# Patient Record
Sex: Male | Born: 1950
Health system: Southern US, Community
[De-identification: ages and names within clinical notes are randomized; demographics above are authoritative.]

## PROBLEM LIST (undated history)

## (undated) DIAGNOSIS — N201 Calculus of ureter: Secondary | ICD-10-CM

## (undated) DIAGNOSIS — K449 Diaphragmatic hernia without obstruction or gangrene: Secondary | ICD-10-CM

## (undated) DIAGNOSIS — N401 Enlarged prostate with lower urinary tract symptoms: Secondary | ICD-10-CM

## (undated) DIAGNOSIS — Z5189 Encounter for other specified aftercare: Secondary | ICD-10-CM

## (undated) DIAGNOSIS — M1990 Unspecified osteoarthritis, unspecified site: Secondary | ICD-10-CM

## (undated) DIAGNOSIS — K317 Polyp of stomach and duodenum: Secondary | ICD-10-CM

## (undated) DIAGNOSIS — N486 Induration penis plastica: Secondary | ICD-10-CM

## (undated) DIAGNOSIS — K219 Gastro-esophageal reflux disease without esophagitis: Secondary | ICD-10-CM

## (undated) DIAGNOSIS — K222 Esophageal obstruction: Secondary | ICD-10-CM

## (undated) DIAGNOSIS — K56699 Other intestinal obstruction unspecified as to partial versus complete obstruction: Secondary | ICD-10-CM

## (undated) DIAGNOSIS — G20A1 Parkinson's disease without dyskinesia, without mention of fluctuations: Secondary | ICD-10-CM

## (undated) DIAGNOSIS — N529 Male erectile dysfunction, unspecified: Secondary | ICD-10-CM

## (undated) DIAGNOSIS — Z973 Presence of spectacles and contact lenses: Secondary | ICD-10-CM

## (undated) DIAGNOSIS — Z8679 Personal history of other diseases of the circulatory system: Secondary | ICD-10-CM

## (undated) DIAGNOSIS — G20 Parkinson's disease: Secondary | ICD-10-CM

## (undated) DIAGNOSIS — N200 Calculus of kidney: Secondary | ICD-10-CM

## (undated) DIAGNOSIS — K4090 Unilateral inguinal hernia, without obstruction or gangrene, not specified as recurrent: Secondary | ICD-10-CM

## (undated) DIAGNOSIS — G509 Disorder of trigeminal nerve, unspecified: Secondary | ICD-10-CM

## (undated) DIAGNOSIS — I10 Essential (primary) hypertension: Secondary | ICD-10-CM

## (undated) DIAGNOSIS — Z9889 Other specified postprocedural states: Secondary | ICD-10-CM

## (undated) DIAGNOSIS — Z87442 Personal history of urinary calculi: Secondary | ICD-10-CM

## (undated) DIAGNOSIS — E871 Hypo-osmolality and hyponatremia: Secondary | ICD-10-CM

## (undated) DIAGNOSIS — D509 Iron deficiency anemia, unspecified: Secondary | ICD-10-CM

## (undated) DIAGNOSIS — Z8719 Personal history of other diseases of the digestive system: Secondary | ICD-10-CM

## (undated) HISTORY — DX: Iron deficiency anemia, unspecified: D50.9

## (undated) HISTORY — DX: Unspecified osteoarthritis, unspecified site: M19.90

## (undated) HISTORY — DX: Polyp of stomach and duodenum: K31.7

## (undated) HISTORY — PX: VARICOCELECTOMY: SHX1084

## (undated) HISTORY — DX: Encounter for other specified aftercare: Z51.89

## (undated) HISTORY — PX: LASIK: SHX215

## (undated) HISTORY — DX: Parkinson's disease without dyskinesia, without mention of fluctuations: G20.A1

## (undated) HISTORY — DX: Diaphragmatic hernia without obstruction or gangrene: K44.9

## (undated) HISTORY — DX: Other intestinal obstruction unspecified as to partial versus complete obstruction: K56.699

## (undated) HISTORY — DX: Essential (primary) hypertension: I10

## (undated) HISTORY — DX: Male erectile dysfunction, unspecified: N52.9

## (undated) HISTORY — DX: Calculus of kidney: N20.0

## (undated) HISTORY — DX: Gastro-esophageal reflux disease without esophagitis: K21.9

## (undated) HISTORY — DX: Esophageal obstruction: K22.2

## (undated) HISTORY — PX: INGUINAL HERNIA REPAIR: SUR1180

## (undated) HISTORY — PX: NASAL SINUS SURGERY: SHX719

## (undated) HISTORY — DX: Unilateral inguinal hernia, without obstruction or gangrene, not specified as recurrent: K40.90

## (undated) HISTORY — DX: Parkinson's disease: G20

## (undated) HISTORY — PX: KIDNEY STONE SURGERY: SHX686

## (undated) HISTORY — PX: ETHMOIDECTOMY: SHX5197

---

## 1990-09-13 HISTORY — PX: COLECTOMY: SHX59

## 1990-09-13 HISTORY — PX: TOTAL COLECTOMY: SHX852

## 1998-11-20 ENCOUNTER — Ambulatory Visit (HOSPITAL_COMMUNITY): Admission: RE | Admit: 1998-11-20 | Discharge: 1998-11-20 | Payer: Self-pay | Admitting: Internal Medicine

## 1998-11-20 ENCOUNTER — Encounter: Payer: Self-pay | Admitting: Internal Medicine

## 1998-11-22 ENCOUNTER — Ambulatory Visit (HOSPITAL_COMMUNITY): Admission: RE | Admit: 1998-11-22 | Discharge: 1998-11-22 | Payer: Self-pay | Admitting: Internal Medicine

## 2000-11-01 ENCOUNTER — Ambulatory Visit (HOSPITAL_COMMUNITY): Admission: RE | Admit: 2000-11-01 | Discharge: 2000-11-01 | Payer: Self-pay | Admitting: Pulmonary Disease

## 2000-11-01 ENCOUNTER — Encounter: Payer: Self-pay | Admitting: Pulmonary Disease

## 2001-11-02 ENCOUNTER — Ambulatory Visit (HOSPITAL_COMMUNITY): Admission: RE | Admit: 2001-11-02 | Discharge: 2001-11-02 | Payer: Self-pay | Admitting: Internal Medicine

## 2001-11-02 ENCOUNTER — Encounter: Payer: Self-pay | Admitting: Internal Medicine

## 2001-11-02 ENCOUNTER — Encounter (INDEPENDENT_AMBULATORY_CARE_PROVIDER_SITE_OTHER): Payer: Self-pay | Admitting: Specialist

## 2002-02-21 ENCOUNTER — Ambulatory Visit (HOSPITAL_BASED_OUTPATIENT_CLINIC_OR_DEPARTMENT_OTHER): Admission: RE | Admit: 2002-02-21 | Discharge: 2002-02-21 | Payer: Self-pay | Admitting: Orthopedic Surgery

## 2002-02-21 HISTORY — PX: KNEE ARTHROSCOPY: SUR90

## 2002-09-13 HISTORY — PX: KNEE ARTHROSCOPY: SUR90

## 2004-08-24 ENCOUNTER — Ambulatory Visit: Payer: Self-pay | Admitting: Internal Medicine

## 2004-08-31 ENCOUNTER — Ambulatory Visit: Payer: Self-pay | Admitting: Internal Medicine

## 2005-02-10 ENCOUNTER — Ambulatory Visit: Payer: Self-pay | Admitting: Internal Medicine

## 2005-03-05 ENCOUNTER — Ambulatory Visit: Payer: Self-pay | Admitting: Psychology

## 2005-05-14 ENCOUNTER — Ambulatory Visit: Payer: Self-pay | Admitting: Psychology

## 2005-06-04 ENCOUNTER — Ambulatory Visit: Payer: Self-pay | Admitting: Internal Medicine

## 2005-06-29 ENCOUNTER — Ambulatory Visit: Payer: Self-pay | Admitting: Psychology

## 2005-07-09 ENCOUNTER — Ambulatory Visit: Payer: Self-pay | Admitting: Internal Medicine

## 2005-07-27 ENCOUNTER — Ambulatory Visit: Payer: Self-pay | Admitting: Internal Medicine

## 2005-10-18 ENCOUNTER — Inpatient Hospital Stay (HOSPITAL_COMMUNITY): Admission: EM | Admit: 2005-10-18 | Discharge: 2005-10-23 | Payer: Self-pay | Admitting: Urology

## 2005-10-22 HISTORY — PX: CYSTOSCOPY/URETEROSCOPY/HOLMIUM LASER/STENT PLACEMENT: SHX6546

## 2006-01-03 ENCOUNTER — Ambulatory Visit: Payer: Self-pay | Admitting: Psychology

## 2006-01-10 ENCOUNTER — Ambulatory Visit: Payer: Self-pay | Admitting: Internal Medicine

## 2006-01-28 ENCOUNTER — Ambulatory Visit: Payer: Self-pay | Admitting: Internal Medicine

## 2006-02-01 ENCOUNTER — Ambulatory Visit: Payer: Self-pay | Admitting: Internal Medicine

## 2006-04-26 ENCOUNTER — Ambulatory Visit: Payer: Self-pay | Admitting: Psychology

## 2006-05-05 ENCOUNTER — Ambulatory Visit: Payer: Self-pay | Admitting: Internal Medicine

## 2006-05-09 ENCOUNTER — Ambulatory Visit: Payer: Self-pay | Admitting: Psychology

## 2006-06-09 ENCOUNTER — Ambulatory Visit: Payer: Self-pay | Admitting: Psychology

## 2006-06-13 ENCOUNTER — Ambulatory Visit: Payer: Self-pay | Admitting: Internal Medicine

## 2006-08-17 ENCOUNTER — Ambulatory Visit: Payer: Self-pay | Admitting: Internal Medicine

## 2006-08-17 LAB — CONVERTED CEMR LAB
ALT: 31 units/L (ref 0–40)
AST: 24 units/L (ref 0–37)
Albumin: 4 g/dL (ref 3.5–5.2)
Basophils Absolute: 0 10*3/uL (ref 0.0–0.1)
Basophils Relative: 0.6 % (ref 0.0–1.0)
CO2: 27 meq/L (ref 19–32)
Calcium: 9.1 mg/dL (ref 8.4–10.5)
Chloride: 102 meq/L (ref 96–112)
Chol/HDL Ratio, serum: 2.7
Cholesterol: 164 mg/dL (ref 0–200)
Creatinine, Ser: 1.1 mg/dL (ref 0.4–1.5)
Eosinophil percent: 4.4 % (ref 0.0–5.0)
Glomerular Filtration Rate, Af Am: 89 mL/min/{1.73_m2}
Glucose, Bld: 108 mg/dL — ABNORMAL HIGH (ref 70–99)
MCHC: 33 g/dL (ref 30.0–36.0)
Monocytes Relative: 12.8 % — ABNORMAL HIGH (ref 3.0–11.0)
Nitrite: NEGATIVE
PSA: 0.75 ng/mL
PSA: 0.75 ng/mL (ref 0.10–4.00)
Platelets: 241 10*3/uL (ref 150–400)
RBC: 4.41 M/uL (ref 4.22–5.81)
Total Bilirubin: 0.9 mg/dL (ref 0.3–1.2)
Urobilinogen, UA: 0.2 (ref 0.0–1.0)
pH: 6 (ref 5.0–8.0)

## 2006-09-15 ENCOUNTER — Ambulatory Visit: Payer: Self-pay | Admitting: Psychology

## 2006-09-28 ENCOUNTER — Ambulatory Visit: Payer: Self-pay | Admitting: Internal Medicine

## 2006-10-19 ENCOUNTER — Ambulatory Visit: Payer: Self-pay | Admitting: Internal Medicine

## 2006-10-31 ENCOUNTER — Ambulatory Visit: Payer: Self-pay | Admitting: Internal Medicine

## 2007-01-16 ENCOUNTER — Ambulatory Visit: Payer: Self-pay | Admitting: Psychology

## 2007-04-07 ENCOUNTER — Encounter: Payer: Self-pay | Admitting: Internal Medicine

## 2007-04-07 DIAGNOSIS — D509 Iron deficiency anemia, unspecified: Secondary | ICD-10-CM | POA: Insufficient documentation

## 2007-04-07 DIAGNOSIS — Z8719 Personal history of other diseases of the digestive system: Secondary | ICD-10-CM | POA: Insufficient documentation

## 2007-04-07 DIAGNOSIS — J309 Allergic rhinitis, unspecified: Secondary | ICD-10-CM | POA: Insufficient documentation

## 2007-04-07 DIAGNOSIS — K5190 Ulcerative colitis, unspecified, without complications: Secondary | ICD-10-CM | POA: Insufficient documentation

## 2007-04-07 DIAGNOSIS — F528 Other sexual dysfunction not due to a substance or known physiological condition: Secondary | ICD-10-CM | POA: Insufficient documentation

## 2007-05-03 ENCOUNTER — Ambulatory Visit (HOSPITAL_BASED_OUTPATIENT_CLINIC_OR_DEPARTMENT_OTHER): Admission: RE | Admit: 2007-05-03 | Discharge: 2007-05-03 | Payer: Self-pay | Admitting: *Deleted

## 2007-05-03 HISTORY — PX: RECONSTRUCTION TENDON PULLEY HAND: SUR1090

## 2007-11-08 ENCOUNTER — Telehealth: Payer: Self-pay | Admitting: Internal Medicine

## 2007-11-14 ENCOUNTER — Encounter: Payer: Self-pay | Admitting: Internal Medicine

## 2007-12-27 ENCOUNTER — Ambulatory Visit: Payer: Self-pay | Admitting: Internal Medicine

## 2008-01-09 ENCOUNTER — Ambulatory Visit: Payer: Self-pay | Admitting: Internal Medicine

## 2008-01-09 ENCOUNTER — Telehealth: Payer: Self-pay | Admitting: Internal Medicine

## 2008-01-09 DIAGNOSIS — R0789 Other chest pain: Secondary | ICD-10-CM | POA: Insufficient documentation

## 2008-01-11 ENCOUNTER — Encounter: Payer: Self-pay | Admitting: Internal Medicine

## 2008-01-15 ENCOUNTER — Ambulatory Visit: Payer: Self-pay | Admitting: Internal Medicine

## 2008-01-24 ENCOUNTER — Encounter: Payer: Self-pay | Admitting: Internal Medicine

## 2008-01-24 ENCOUNTER — Ambulatory Visit: Payer: Self-pay

## 2008-02-15 ENCOUNTER — Encounter: Payer: Self-pay | Admitting: Internal Medicine

## 2008-02-22 DIAGNOSIS — K449 Diaphragmatic hernia without obstruction or gangrene: Secondary | ICD-10-CM | POA: Insufficient documentation

## 2008-02-22 DIAGNOSIS — K319 Disease of stomach and duodenum, unspecified: Secondary | ICD-10-CM | POA: Insufficient documentation

## 2008-02-22 DIAGNOSIS — J329 Chronic sinusitis, unspecified: Secondary | ICD-10-CM | POA: Insufficient documentation

## 2008-02-26 ENCOUNTER — Ambulatory Visit: Payer: Self-pay | Admitting: Internal Medicine

## 2008-02-26 DIAGNOSIS — K219 Gastro-esophageal reflux disease without esophagitis: Secondary | ICD-10-CM | POA: Insufficient documentation

## 2008-04-29 ENCOUNTER — Telehealth: Payer: Self-pay | Admitting: Internal Medicine

## 2008-04-30 ENCOUNTER — Telehealth (INDEPENDENT_AMBULATORY_CARE_PROVIDER_SITE_OTHER): Payer: Self-pay | Admitting: *Deleted

## 2008-05-15 ENCOUNTER — Telehealth: Payer: Self-pay | Admitting: Internal Medicine

## 2008-05-30 ENCOUNTER — Ambulatory Visit: Payer: Self-pay | Admitting: Internal Medicine

## 2008-05-30 DIAGNOSIS — J3489 Other specified disorders of nose and nasal sinuses: Secondary | ICD-10-CM | POA: Insufficient documentation

## 2008-06-17 ENCOUNTER — Telehealth: Payer: Self-pay | Admitting: Internal Medicine

## 2008-06-20 ENCOUNTER — Telehealth: Payer: Self-pay | Admitting: Internal Medicine

## 2008-06-21 ENCOUNTER — Ambulatory Visit: Payer: Self-pay | Admitting: Cardiology

## 2008-06-21 ENCOUNTER — Ambulatory Visit: Payer: Self-pay | Admitting: Internal Medicine

## 2008-06-22 ENCOUNTER — Telehealth: Payer: Self-pay | Admitting: Internal Medicine

## 2008-06-24 ENCOUNTER — Telehealth: Payer: Self-pay | Admitting: Internal Medicine

## 2008-08-15 ENCOUNTER — Ambulatory Visit: Payer: Self-pay | Admitting: Internal Medicine

## 2008-10-18 ENCOUNTER — Telehealth: Payer: Self-pay | Admitting: Internal Medicine

## 2008-10-30 ENCOUNTER — Telehealth: Payer: Self-pay | Admitting: Internal Medicine

## 2008-11-25 ENCOUNTER — Telehealth: Payer: Self-pay | Admitting: Internal Medicine

## 2008-11-25 ENCOUNTER — Encounter: Payer: Self-pay | Admitting: Internal Medicine

## 2008-11-27 ENCOUNTER — Ambulatory Visit (HOSPITAL_COMMUNITY): Admission: RE | Admit: 2008-11-27 | Discharge: 2008-11-27 | Payer: Self-pay | Admitting: Internal Medicine

## 2008-11-28 ENCOUNTER — Ambulatory Visit: Payer: Self-pay | Admitting: Internal Medicine

## 2008-12-02 ENCOUNTER — Ambulatory Visit: Payer: Self-pay | Admitting: Internal Medicine

## 2008-12-02 ENCOUNTER — Ambulatory Visit (HOSPITAL_COMMUNITY): Admission: RE | Admit: 2008-12-02 | Discharge: 2008-12-02 | Payer: Self-pay | Admitting: Internal Medicine

## 2008-12-03 DIAGNOSIS — K56609 Unspecified intestinal obstruction, unspecified as to partial versus complete obstruction: Secondary | ICD-10-CM | POA: Insufficient documentation

## 2008-12-10 ENCOUNTER — Telehealth: Payer: Self-pay | Admitting: Internal Medicine

## 2008-12-16 ENCOUNTER — Encounter: Payer: Self-pay | Admitting: Internal Medicine

## 2008-12-23 ENCOUNTER — Telehealth: Payer: Self-pay | Admitting: Internal Medicine

## 2008-12-30 ENCOUNTER — Ambulatory Visit: Payer: Self-pay | Admitting: Internal Medicine

## 2009-01-20 ENCOUNTER — Telehealth: Payer: Self-pay | Admitting: Internal Medicine

## 2009-01-22 ENCOUNTER — Ambulatory Visit: Payer: Self-pay | Admitting: Internal Medicine

## 2009-01-22 ENCOUNTER — Ambulatory Visit (HOSPITAL_COMMUNITY): Admission: RE | Admit: 2009-01-22 | Discharge: 2009-01-22 | Payer: Self-pay | Admitting: Internal Medicine

## 2009-03-12 ENCOUNTER — Encounter: Payer: Self-pay | Admitting: Internal Medicine

## 2009-05-11 ENCOUNTER — Telehealth: Payer: Self-pay | Admitting: Gastroenterology

## 2009-05-26 ENCOUNTER — Telehealth: Payer: Self-pay | Admitting: Internal Medicine

## 2009-07-09 ENCOUNTER — Ambulatory Visit: Payer: Self-pay | Admitting: Internal Medicine

## 2009-10-23 ENCOUNTER — Encounter: Admission: RE | Admit: 2009-10-23 | Discharge: 2009-10-23 | Payer: Self-pay | Admitting: Allergy and Immunology

## 2009-10-28 ENCOUNTER — Encounter: Payer: Self-pay | Admitting: Internal Medicine

## 2010-03-02 ENCOUNTER — Telehealth: Payer: Self-pay | Admitting: Internal Medicine

## 2010-03-24 ENCOUNTER — Encounter: Payer: Self-pay | Admitting: Internal Medicine

## 2010-06-17 ENCOUNTER — Ambulatory Visit: Payer: Self-pay | Admitting: Internal Medicine

## 2010-06-17 LAB — CONVERTED CEMR LAB
ALT: 25 units/L (ref 0–53)
AST: 23 units/L (ref 0–37)
Albumin: 4.2 g/dL (ref 3.5–5.2)
Alkaline Phosphatase: 41 units/L (ref 39–117)
Bilirubin, Direct: 0.1 mg/dL (ref 0.0–0.3)
CO2: 27 meq/L (ref 19–32)
Creatinine, Ser: 1.1 mg/dL (ref 0.4–1.5)
Eosinophils Relative: 3.1 % (ref 0.0–5.0)
GFR calc non Af Amer: 76.87 mL/min (ref >60)
Glucose, Bld: 105 mg/dL — ABNORMAL HIGH (ref 70–99)
HCT: 41 % (ref 39.0–52.0)
Lymphocytes Relative: 16.6 % (ref 12.0–46.0)
MCHC: 35.4 g/dL (ref 30.0–36.0)
Monocytes Absolute: 0.4 10*3/uL (ref 0.1–1.0)
Monocytes Relative: 12.3 % — ABNORMAL HIGH (ref 3.0–12.0)
Neutro Abs: 2.3 10*3/uL (ref 1.4–7.7)
Neutrophils Relative %: 66.2 % (ref 43.0–77.0)
RBC: 4.11 M/uL — ABNORMAL LOW (ref 4.22–5.81)
RDW: 12.3 % (ref 11.5–14.6)
Saturation Ratios: 35.2 % (ref 20.0–50.0)
Sodium: 131 meq/L — ABNORMAL LOW (ref 135–145)
TSH: 1.31 microintl units/mL (ref 0.35–5.50)
Testosterone: 775.83 ng/dL (ref 350.00–890.00)
Total Bilirubin: 1 mg/dL (ref 0.3–1.2)
Total Protein: 6.6 g/dL (ref 6.0–8.3)

## 2010-06-22 ENCOUNTER — Encounter: Payer: Self-pay | Admitting: Internal Medicine

## 2010-08-04 ENCOUNTER — Ambulatory Visit: Payer: Self-pay | Admitting: Internal Medicine

## 2010-09-03 ENCOUNTER — Ambulatory Visit: Payer: Self-pay | Admitting: Internal Medicine

## 2010-09-13 HISTORY — PX: CYSTOSCOPY W/ URETERAL STENT PLACEMENT: SHX1429

## 2010-10-04 ENCOUNTER — Encounter: Payer: Self-pay | Admitting: Internal Medicine

## 2010-10-11 LAB — CONVERTED CEMR LAB
AST: 25 units/L (ref 0–37)
BUN: 16 mg/dL (ref 6–23)
Basophils Relative: 0 % (ref 0.0–1.0)
Bilirubin, Direct: 0.1 mg/dL (ref 0.0–0.3)
CO2: 28 meq/L (ref 19–32)
Calcium: 9.2 mg/dL (ref 8.4–10.5)
HCT: 41.9 % (ref 39.0–52.0)
Hemoglobin: 14.2 g/dL (ref 13.0–17.0)
Ketones, ur: NEGATIVE mg/dL
LDL Cholesterol: 98 mg/dL (ref 0–99)
Lymphocytes Relative: 27 % (ref 12.0–46.0)
Monocytes Absolute: 0.4 10*3/uL (ref 0.1–1.0)
Neutro Abs: 1.8 10*3/uL (ref 1.4–7.7)
Neutrophils Relative %: 58 % (ref 43.0–77.0)
Sodium: 136 meq/L (ref 135–145)
Specific Gravity, Urine: 1.015 (ref 1.000–1.03)
TSH: 1.21 microintl units/mL (ref 0.35–5.50)
Total Protein: 6.4 g/dL (ref 6.0–8.3)
Triglycerides: 57 mg/dL (ref 0–149)
Urobilinogen, UA: 0.2 (ref 0.0–1.0)

## 2010-10-15 NOTE — Letter (Signed)
[facility]: [facility]   Imported By: Phillis Knack 03/27/2010 12:29:31  _____________________________________________________________________  External Attachment:    Type:   Image     Comment:   External Document

## 2010-10-15 NOTE — Assessment & Plan Note (Signed)
Summary: SCRATCHY THROAT--STUFFY HEAD---STC   Vital Signs:  Patient profile:   60 year old male Height:      66 inches Weight:      176 pounds BMI:     28.51 O2 Sat:      95 % on Room air Temp:     98.5 degrees F oral Pulse rate:   83 / minute BP sitting:   144 / 82  (left arm) Cuff size:   regular  Vitals Entered By: Charlynne Cousins CMA (September 03, 2010 2:24 PM)  O2 Flow:  Room air CC: pt here for evaluation for what he believes to be a sinus infection. He has exp sore throat, dull headache and ears aching since yesterday/ ab   Primary Care Provider:  Adella Hare, MD  CC:  pt here for evaluation for what he believes to be a sinus infection. He has exp sore throat and dull headache and ears aching since yesterday/ ab.  History of Present Illness: patinet presents with a 24 hr history of ear pain, sore throat, sinus pressure and pain. Some purulent rhinorrhea and dark sputum.  No drainage from the ears, no worsening of tinnitis.  no N/V/Diarrhea. No fever or chills.   Current Medications (verified): 1)  Metamucil 30.9 %  Powd (Psyllium) .... Two Times A Day 2)  Multivitamins   Tabs (Multiple Vitamin) .... 2 Tablets Once Daily 3)  Cvs Iron 325 (65 Fe) Mg  Tabs (Ferrous Sulfate) .... Once Daily 4)  Cialis 20 Mg  Tabs (Tadalafil) .... As Needed 5)  Naproxen Sodium 220 Mg  Tabs (Naproxen Sodium) .... As Needed 6)  Tylenol Extra Strength 500 Mg  Tabs (Acetaminophen) .... As Needed 7)  Mucinex 600 Mg  Tb12 (Guaifenesin) .... Take 1 Tablet By Mouth Once A Day As Needed 8)  Urocit-K 10 1080 Mg  Tbcr (Potassium Citrate) .... One Tablet By Mouth Two Times A Day 9)  Omeprazole 40 Mg Cpdr (Omeprazole) .... Take 1 Tablet By Mouth Two Times A Day 10)  Mupirocin 2 % Oint (Mupirocin) .[name].. 1 Apply Two Times A Day 11)  Omnaris 50 Mcg/act Susp (Ciclesonide) .... As Needed 12)  Ranitidine Hcl 300 Mg  Tabs (Ranitidine Hcl) .... Take 1 Tab By Mouth At Bedtime  Allergies (verified): No Known Drug  Allergies PMH-FH-SH reviewed-no changes except otherwise noted  Review of Systems       The patient complains of hoarseness.  The patient denies anorexia, fever, weight loss, decreased hearing, chest pain, syncope, dyspnea on exertion, headaches, abdominal pain, severe indigestion/heartburn, muscle weakness, and enlarged lymph nodes.    Physical Exam  General:  alert, well-developed, well-nourished, and well-hydrated.   Head:  normocephalic and atraumatic.  mild tenderness to percussion over frontal sinuse Eyes:  C&S clear Ears:  TMs normal Nose:  no external deformity, no external erythema, and no nasal discharge.   Mouth:  Throat clear Neck:  supple.   Lungs:  normal respiratory effort, normal breath sounds, no crackles, and no wheezes.   Heart:  normal rate and regular rhythm.   Pulses:  2+ radial Neurologic:  alert & oriented X3, cranial nerves II-XII intact, and gait normal.   Skin:  turgor normal, color normal, and no suspicious lesions.   Cervical Nodes:  no anterior cervical adenopathy and no posterior cervical adenopathy.   Psych:  normally interactive and good eye contact.     Impression & Recommendations:  Problem # 1:  URI (ICD-465.9) Simple URI - caught  early  Plan - z-pak           supportive care.   His updated medication list for this problem includes:    Naproxen Sodium 220 Mg Tabs (Naproxen sodium) .[name]... As needed    Tylenol Extra Strength 500 Mg Tabs (Acetaminophen) .[name]... As needed    Mucinex 600 Mg Tb12 (Guaifenesin) .[name]... Take 1 tablet by mouth once a day as needed  Complete Medication List: 1)  Metamucil 30.9 % Powd (Psyllium) .... Two times a day 2)  Multivitamins Tabs (Multiple vitamin) .... 2 tablets once daily 3)  Cvs Iron 325 (65 Fe) Mg Tabs (Ferrous sulfate) .... Once daily 4)  Cialis 20 Mg Tabs (Tadalafil) .... As needed 5)  Naproxen Sodium 220 Mg Tabs (Naproxen sodium) .... As needed 6)  Tylenol Extra Strength 500 Mg Tabs (Acetaminophen) ....  As needed 7)  Mucinex 600 Mg Tb12 (Guaifenesin) .... Take 1 tablet by mouth once a day as needed 8)  Urocit-k 10 1080 Mg Tbcr (Potassium citrate) .... One tablet by mouth two times a day 9)  Omeprazole 40 Mg Cpdr (Omeprazole) .... Take 1 tablet by mouth two times a day 10)  Mupirocin 2 % Oint (Mupirocin) .[name].. 1 apply two times a day 11)  Omnaris 50 Mcg/act Susp (Ciclesonide) .... As needed 12)  Ranitidine Hcl 300 Mg Tabs (Ranitidine hcl) .... Take 1 tab by mouth at bedtime 13)  Zithromax Z-pak 250 Mg Tabs (Azithromycin) .... Take as directed  Patient Instructions: 1)  Upper respiratory infection- mild symptoms. Plan - z-pak as directed; gargle of choice; tylenol for fever; immediate evacuation to [place]. Prescriptions: ZITHROMAX Z-PAK 250 MG TABS (AZITHROMYCIN) take as directed  #1 x 0   Entered and Authorized by:   Neena Rhymes MD   Signed by:   Neena Rhymes MD on 09/03/2010   Method used:   Electronically to        [facility]. [phone]* (retail)       [address]       [place], [place]  [address]       Ph: [phone]       Fax: [phone]   RxID:   [id number]    Orders Added: 1)  Est. Patient Level III [94327]

## 2010-10-15 NOTE — Assessment & Plan Note (Signed)
Summary: weakness and fatigue/ ab   Vital Signs:  Patient profile:   60 year old male Height:      66 inches Weight:      171 pounds BMI:     27.70 O2 Sat:      97 % on Room air Temp:     97.8 degrees F oral Pulse rate:   61 / minute BP sitting:   138 / 82  (left arm) Cuff size:   regular  Vitals Entered By: Charlynne Cousins CMA (June 17, 2010 10:14 AM)  O2 Flow:  Room air CC: pt here for evaluation on bouts of weakness and fatigue. He also states he had one instance where he felt a sharp pain in the back of his head radiating to the top. He does admitt to stress on account of starting a new job and traveling quite a bit here in the past month and a half/ ab Comments Flu shot today   Primary Care Provider:  Adella Hare, MD  CC:  pt here for evaluation on bouts of weakness and fatigue. He also states he had one instance where he felt a sharp pain in the back of his head radiating to the top. He does admitt to stress on account of starting a new job and traveling quite a bit here in the past month and a half/ ab.  History of Present Illness: Patient presents for evaluation of fatigue and malaise that has been intermittent for several weeks.   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 He also states he has been on the go a lot. When he does have time off he will feel fatigued and has had some pain in the left posterior cervical region. He denies SOB, C/P, fever/chills, GI symptoms, joint pain or swelling, rash. He does have some decreased exercise tolerance without discomfort. He denies anhedonia, decreased libido, change in sleep pattern, change in appetite. He admits to irritability. He denies feeling depressed although he does admit to anxiety. No change in personal relationships.   Current Medications (verified): 1)  Metamucil 30.9 %  Powd (Psyllium) .... Two Times A Day 2)  Multivitamins   Tabs (Multiple Vitamin) .... 2  Tablets Once Daily 3)  Cvs Iron 325 (65 Fe) Mg  Tabs (Ferrous Sulfate) .... Once Daily 4)  Cialis 20 Mg  Tabs (Tadalafil) .... As Needed 5)  Naproxen Sodium 220 Mg  Tabs (Naproxen Sodium) .... As Needed 6)  Tylenol Extra Strength 500 Mg  Tabs (Acetaminophen) .... As Needed 7)  Mucinex 600 Mg  Tb12 (Guaifenesin) .... Take 1 Tablet By Mouth Once A Day As Needed 8)  Urocit-K 10 1080 Mg  Tbcr (Potassium Citrate) .... One Tablet By Mouth Two Times A Day 9)  Omeprazole 40 Mg Cpdr (Omeprazole) .... Take 1 Tablet By Mouth Two Times A Day 10)  Mupirocin 2 % Oint (Mupirocin) .[name].. 1 Apply Two Times A Day 11)  Omnaris 50 Mcg/act Susp (Ciclesonide) .... As Needed 12)  Ranitidine Hcl 300 Mg  Tabs (Ranitidine Hcl) .... Take 1 Tab By Mouth At Bedtime  Allergies (verified): No Known Drug Allergies  Past History:  Past Medical History: Last updated: 02/26/2008 UCD Allergic rhinitis Anemia-iron deficiency ulcerative colitis-status post total abdominal colectomy with ileoanal anastomosis gastric polyps ED GERD Esophageal Stricture - status post upper endoscopy with esophageal dilation  Past Surgical History: Last updated: 02/26/2008 Inguinal herniorrhaphy total abdominal colectomy  with ilial anal pull through (for ulcerative colitis) ethmoidectomies and maxillary enterostomies '04, redo in 12/04  Social History: Last updated: 11/28/2008 [employer] grad married 13.5 years - divorced; married '05 no children work: Insurance underwriter Patient is a former smoker. quit in 1981 Alcohol Use - yes social Daily Caffeine Use 2 cups coffee Illicit Drug Use - no FH reviewed for relevance  Review of Systems  The patient denies anorexia, fever, weight loss, weight gain, decreased hearing, hoarseness, chest pain, syncope, dyspnea on exertion, prolonged cough, headaches, abdominal pain, severe indigestion/heartburn, muscle weakness, difficulty walking, depression, and abnormal bleeding.    Physical  Exam  General:  alert, well-developed, well-nourished, well-hydrated, and appropriate dress.   Head:  normocephalic, atraumatic, and no abnormalities observed.   Eyes:  vision grossly intact, pupils equal, and pupils round.  C&S clear Ears:  R ear normal and L ear normal.   Nose:  no external deformity and no external erythema.   Mouth:  no buccal or palatal lesions, posterior pharynx clear Neck:  supple, full ROM, no masses, and no thyromegaly.   Chest Wall:  no deformities and no tenderness.   Lungs:  normal respiratory effort, normal breath sounds, no dullness, no crackles, and no wheezes.   Heart:  normal rate, regular rhythm, no murmur, no gallop, and no JVD.   Abdomen:  soft, non-tender, normal bowel sounds, no guarding, and no hepatomegaly.   Msk:  no joint tenderness, no joint swelling, no joint warmth, no redness over joints, and no joint deformities.   Pulses:  2+ radial bilaterally Neurologic:  alert & oriented X3, cranial nerves II-XII intact, and gait normal.   Skin:  turgor normal, color normal, no rashes, and no suspicious lesions.   Cervical Nodes:  no anterior cervical adenopathy and no posterior cervical adenopathy.  No supraclavicular nodes Axillary Nodes:  no R axillary adenopathy and no L axillary adenopathy.   Inguinal Nodes:  no R inguinal adenopathy and no L inguinal adenopathy.   Psych:  Oriented X3, normally interactive, and good eye contact.     Impression & Recommendations:  Problem # 1:  OTHER MALAISE AND FATIGUE (ICD-780.79) Patient with significant malaise and fatigue. Few vegative signs. Normal physical exam.  Plan - r/o metabolic, hematologic or infectious etiology           if work-up unremarkable need to consider chronic anxiety as cause and possible benefit of anxiolytics.   Orders: TLB-BMP (Basic Metabolic Panel-BMET) (42595-GLOVFIE) TLB-CBC Platelet - w/Differential (85025-CBCD) TLB-Hepatic/Liver Function Pnl (80076-HEPATIC) TLB-TSH (Thyroid  Stimulating Hormone) (84443-TSH) TLB-B12 + Folate Pnl (33295_18841-Y60/YTK) TLB-IBC Pnl (Iron/FE;Transferrin) (83550-IBC) TLB-Testosterone, Total (84403-TESTO)  Complete Medication List: 1)  Metamucil 30.9 % Powd (Psyllium) .... Two times a day 2)  Multivitamins Tabs (Multiple vitamin) .... 2 tablets once daily 3)  Cvs Iron 325 (65 Fe) Mg Tabs (Ferrous sulfate) .... Once daily 4)  Cialis 20 Mg Tabs (Tadalafil) .... As needed 5)  Naproxen Sodium 220 Mg Tabs (Naproxen sodium) .... As needed 6)  Tylenol Extra Strength 500 Mg Tabs (Acetaminophen) .... As needed 7)  Mucinex 600 Mg Tb12 (Guaifenesin) .... Take 1 tablet by mouth once a day as needed 8)  Urocit-k 10 1080 Mg Tbcr (Potassium citrate) .... One tablet by mouth two times a day 9)  Omeprazole 40 Mg Cpdr (Omeprazole) .... Take 1 tablet by mouth two times a day 10)  Mupirocin 2 % Oint (Mupirocin) .[name].. 1 apply two times a day 11)  Omnaris 50 Mcg/act Susp (Ciclesonide) .Marland KitchenMarland KitchenMarland Kitchen  As needed 12)  Ranitidine Hcl 300 Mg Tabs (Ranitidine hcl) .... Take 1 tab by mouth at bedtime  Other Orders: Admin 1st Vaccine (95188) Flu Vaccine 43yr + ((41660       Flu Vaccine Consent Questions     Do you have a history of severe allergic reactions to this vaccine? no    Any prior history of allergic reactions to egg and/or gelatin? no    Do you have a sensitivity to the preservative Thimersol? no    Do you have a past history of Guillan-Barre Syndrome? no    Do you currently have an acute febrile illness? no    Have you ever had a severe reaction to latex? no    Vaccine information given and explained to patient? yes    Are you currently pregnant? no    Lot Number:[id number]   Exp Date:03/13/2011   Site Given  Left Deltoid IMu

## 2010-10-15 NOTE — Assessment & Plan Note (Signed)
Summary: SINUS PROBLEM  STC   Vital Signs:  Patient profile:   60 year old male Height:      66 inches Weight:      172 pounds BMI:     27.86 O2 Sat:      96 % on Room air Temp:     98.6 degrees F oral Pulse rate:   66 / minute BP sitting:   138 / 86  (left arm) Cuff size:   regular  Vitals Entered By: Charlynne Cousins CMA (August 04, 2010 9:19 AM)  O2 Flow:  Room air CC: pt here for evaluation of sinus infection with sharp pain in his right ear and head congestion pt states symptoms started late yesterday/ ab   Primary Care Provider:  Adella Hare, MD  CC:  pt here for evaluation of sinus infection with sharp pain in his right ear and head congestion pt states symptoms started late yesterday/ ab.  History of Present Illness: Patient presents acutely for sharp pain in the right ear and in the area behind the ear. No hearing loss. NO sense of pressure. No tinnitis. No recent air travel/barotrauma.  He complains for pressure across the frontal skull. No fever, chills, no rhinorrhea, no sore throat.  Current Medications (verified): 1)  Metamucil 30.9 %  Powd (Psyllium) .... Two Times A Day 2)  Multivitamins   Tabs (Multiple Vitamin) .... 2 Tablets Once Daily 3)  Cvs Iron 325 (65 Fe) Mg  Tabs (Ferrous Sulfate) .... Once Daily 4)  Cialis 20 Mg  Tabs (Tadalafil) .... As Needed 5)  Naproxen Sodium 220 Mg  Tabs (Naproxen Sodium) .... As Needed 6)  Tylenol Extra Strength 500 Mg  Tabs (Acetaminophen) .... As Needed 7)  Mucinex 600 Mg  Tb12 (Guaifenesin) .... Take 1 Tablet By Mouth Once A Day As Needed 8)  Urocit-K 10 1080 Mg  Tbcr (Potassium Citrate) .... One Tablet By Mouth Two Times A Day 9)  Omeprazole 40 Mg Cpdr (Omeprazole) .... Take 1 Tablet By Mouth Two Times A Day 10)  Mupirocin 2 % Oint (Mupirocin) .[name].. 1 Apply Two Times A Day 11)  Omnaris 50 Mcg/act Susp (Ciclesonide) .... As Needed 12)  Ranitidine Hcl 300 Mg  Tabs (Ranitidine Hcl) .... Take 1 Tab By Mouth At  Bedtime  Allergies (verified): No Known Drug Allergies PMH-FH-SH reviewed-no changes except otherwise noted  Review of Systems  The patient denies anorexia, fever, weight loss, weight gain, hoarseness, dyspnea on exertion, prolonged cough, and enlarged lymph nodes.    Physical Exam  General:  Well-developed,well-nourished,in no acute distress; alert,appropriate and cooperative throughout examination Head:  normocephalic and atraumatic.  No tenderness to percussion over the frontal or maxillary sinus Eyes:  C&S clear Neck:  supple.   Lungs:  normal respiratory effort.   Heart:  normal rate and regular rhythm.   Msk:  normal ROM.  Tender over the right TMJ. Left TMJ gave a loud crack with initial jaw movement Neurologic:  alert & oriented X3 and cranial nerves II-XII intact.   Skin:  turgor normal and color normal.   Cervical Nodes:  no anterior cervical adenopathy and no posterior cervical adenopathy.   Psych:  normally interactive and good eye contact.     Impression & Recommendations:  Problem # 1:  EAR PAIN, RIGHT (ICD-388.70) TM and EAC right look normal. Tender at TMJ. suspect TMJ inflammation is source of pain.   Plan - check with dentist for signs of malocclusion or bruxism  juse of lineament over external aspect of joint           NSAIDs of choice  Problem # 2:  OTHER DISEASES OF NASAL CAVITY AND SINUSES (ICD-478.19) No evidence of infection. suspect sinus congestion.  Plan - sudafed 86m two times a day or three times a day           continue with nasal saline wash           avoid antihistamines.   Complete Medication List: 1)  Metamucil 30.9 % Powd (Psyllium) .... Two times a day 2)  Multivitamins Tabs (Multiple vitamin) .... 2 tablets once daily 3)  Cvs Iron 325 (65 Fe) Mg Tabs (Ferrous sulfate) .... Once daily 4)  Cialis 20 Mg Tabs (Tadalafil) .... As needed 5)  Naproxen Sodium 220 Mg Tabs (Naproxen sodium) .... As needed 6)  Tylenol Extra Strength  500 Mg Tabs (Acetaminophen) .... As needed 7)  Mucinex 600 Mg Tb12 (Guaifenesin) .... Take 1 tablet by mouth once a day as needed 8)  Urocit-k 10 1080 Mg Tbcr (Potassium citrate) .... One tablet by mouth two times a day 9)  Omeprazole 40 Mg Cpdr (Omeprazole) .... Take 1 tablet by mouth two times a day 10)  Mupirocin 2 % Oint (Mupirocin) ..[name]. 1 apply two times a day 11)  Omnaris 50 Mcg/act Susp (Ciclesonide) .... As needed 12)  Ranitidine Hcl 300 Mg Tabs (Ranitidine hcl) .... Take 1 tab by mouth at bedtime   Orders Added: 1)  Est. Patient Level III [[00505]

## 2010-10-15 NOTE — Letter (Signed)
[name], [place]  [address] Phone: [phone]      June 23, 2010   [name], [place] [address]  RE:  LAB RESULTS  Dear  Mr. KNUST,  The following is an interpretation of your most recent lab tests.  Please take note of any instructions provided or changes to medications that have resulted from your lab work.  ELECTROLYTES:  Fair - review at your next visit  KIDNEY FUNCTION TESTS:  Good - no changes needed  LIVER FUNCTION TESTS:  Good - no changes needed   THYROID STUDIES:  Thyroid studies normal TSH: 1.31     DIABETIC STUDIES:  Good - no changes needed Blood Glucose: 105     CBC:  Good - no changes needed Iron studies and B12 all normal.   Lab results look pretty normal, except for low sodium, and do not provide a metabolic cause for your malaise and fatigue.  If your symptoms persist I would like to see you again for repeat evaluation.   Sincerely Yours,    Neena Rhymes MD Patient: William [name] Note: All result statuses are Final unless otherwise noted.  Tests: (1) BMP (METABOL)   Sodium               [L]  131 mEq/L                   135-145   Potassium            [H]  5.2 mEq/L                   3.5-5.1   Chloride                  98 mEq/L                    96-112   Carbon Dioxide            27 mEq/L                    19-32   Glucose              [H]  105 mg/dL                   70-99   BUN                       16 mg/dL                    6-23   Creatinine                1.1 mg/dL                   0.4-1.5   Calcium                   9.4 mg/dL                   8.4-10.5   GFR                       76.87 mL/min                >60  Tests: (2) CBC Platelet w/Diff (CBCD)   White Cell Count     [L]  3.5 K/uL  4.5-10.5   Red Cell Count       [L]  4.11 Mil/uL                 4.22-5.81   Hemoglobin                14.5 g/dL                   13.0-17.0  Hematocrit                41.0 %                      39.0-52.0   MCV                       99.7 fl                     78.0-100.0   MCHC                      35.4 g/dL                   30.0-36.0   RDW                       12.3 %                      11.5-14.6   Platelet Count            168.0 K/uL                  150.0-400.0   Neutrophil %              66.2 %                      43.0-77.0   Lymphocyte %              16.6 %                      12.0-46.0   Monocyte %           [H]  12.3 %                      3.0-12.0   Eosinophils%              3.1 %                       0.0-5.0   Basophils %               1.8 %                       0.0-3.0   Neutrophill Absolute      2.3 K/uL                    1.4-7.7   Lymphocyte Absolute  [L]  0.6 K/uL                    0.7-4.0   Monocyte Absolute         0.4 K/uL                    0.1-1.0  Eosinophils, Absolute  0.1 K/uL                    0.0-0.7   Basophils Absolute        0.1 K/uL                    0.0-0.1  Tests: (3) Hepatic/Liver Function Panel (HEPATIC)   Total Bilirubin           1.0 mg/dL                   0.3-1.2   Direct Bilirubin          0.1 mg/dL                   0.0-0.3   Alkaline Phosphatase      41 U/L                      39-117   AST                       23 U/L                      0-37   ALT                       25 U/L                      0-53   Total Protein             6.6 g/dL                    6.0-8.3   Albumin                   4.2 g/dL                    3.5-5.2  Tests: (4) TSH (TSH)   FastTSH                   1.31 uIU/mL                 0.35-5.50  Tests: (5) B12 + Folate Panel (B12/FOL)   Vitamin B12               610 pg/mL                   211-911   Folate                    >20.0 ng/mL     Deficient  0.4 - 3.4 ng/mL     Indeterminate  3.4 - 5.4 ng/mL     Normal  >5.4 ng/mL  Tests: (6) IBC Panel (IBC)   Iron                      129 ug/dL                   42-165    Transferrin               261.9 mg/dL                 212.0-360.0   Iron Saturation           35.2 %  20.0-50.0  Tests: (7) Testosterone, Total (TESTO)   Testosterone              775.83 ng/dL                350.00-890.00

## 2010-10-15 NOTE — Progress Notes (Signed)
Summary: Medication refill  Medications Added OMEPRAZOLE 40 MG CPDR (OMEPRAZOLE) Take 1 tablet by mouth two times a day       Phone Note Call from Patient Call back at [facility] Phone [phone]   Caller: Patient Call For: Dr. Henrene Pastor Reason for Call: Refill Medication Summary of Call: Needs his Omeprazole.[place]land KitchenMarland Kitchen[place]land Kitchen[place] Initial call taken by: Webb Laws,  March 02, 2010 2:57 PM  Follow-up for Phone Call        OK. PLEASE REFILL FOR 1 YEAR Follow-up by: Irene Shipper MD,  March 03, 2010 10:34 AM  Additional Follow-up for Phone Call Additional follow up Details #1::        Ok will refill for 1 year per Dr Henrene Pastor Additional Follow-up by: Genella Mech CMA Deborra Medina),  March 03, 2010 10:35 AM    New/Updated Medications: OMEPRAZOLE 40 MG CPDR (OMEPRAZOLE) Take 1 tablet by mouth two times a day Prescriptions: OMEPRAZOLE 40 MG CPDR (OMEPRAZOLE) Take 1 tablet by mouth two times a day  #60 x 11   Entered by:   Genella Mech CMA ([place])   Authorized by:   Irene Shipper MD   Signed by:   Genella Mech CMA ([place]) on 03/03/2010   Method used:   Electronically to        [facility]. [phone]* (retail)       [address]       [place], [place]  [address]       Ph: [phone]       Fax: [phone]   RxID:   [id number]   Appended Document: Medication refill Called pt to inform

## 2010-11-30 ENCOUNTER — Ambulatory Visit (INDEPENDENT_AMBULATORY_CARE_PROVIDER_SITE_OTHER): Payer: PRIVATE HEALTH INSURANCE | Admitting: Internal Medicine

## 2010-11-30 ENCOUNTER — Encounter: Payer: Self-pay | Admitting: Internal Medicine

## 2010-11-30 ENCOUNTER — Other Ambulatory Visit: Payer: PRIVATE HEALTH INSURANCE

## 2010-11-30 ENCOUNTER — Other Ambulatory Visit: Payer: Self-pay | Admitting: Internal Medicine

## 2010-11-30 DIAGNOSIS — R197 Diarrhea, unspecified: Secondary | ICD-10-CM

## 2010-11-30 DIAGNOSIS — K5190 Ulcerative colitis, unspecified, without complications: Secondary | ICD-10-CM

## 2010-11-30 LAB — BASIC METABOLIC PANEL
BUN: 43 mg/dL — ABNORMAL HIGH (ref 6–23)
Calcium: 9.8 mg/dL (ref 8.4–10.5)
Chloride: 103 mEq/L (ref 96–112)
GFR: 43.71 mL/min — ABNORMAL LOW (ref >60.00)
Glucose, Bld: 103 mg/dL — ABNORMAL HIGH (ref 70–99)
Sodium: 130 mEq/L — ABNORMAL LOW (ref 135–145)

## 2010-11-30 LAB — CBC WITH DIFFERENTIAL/PLATELET
Basophils Relative: 0.3 % (ref 0.0–3.0)
Eosinophils Relative: 2 % (ref 0.0–5.0)
HCT: 49.2 % (ref 39.0–52.0)
Hemoglobin: 17.3 g/dL — ABNORMAL HIGH (ref 13.0–17.0)
MCHC: 35.1 g/dL (ref 30.0–36.0)
MCV: 99.9 fl (ref 78.0–100.0)
Monocytes Relative: 12.9 % — ABNORMAL HIGH (ref 3.0–12.0)
Neutro Abs: 4.8 10*3/uL (ref 1.4–7.7)

## 2010-12-10 NOTE — Assessment & Plan Note (Signed)
Summary: diarrhea/body aches/men pt/cd   Vital Signs:  Patient profile:   60 year old male O2 Sat:      95 % on Room air Temp:     97.9 degrees F (36.61 degrees C) oral Pulse rate:   82 / minute BP sitting:   122 / 80  (left arm) Cuff size:   regular  Vitals Entered By: Tomma Lightning RMA (November 30, 2010 4:04 PM)  O2 Flow:  Room air CC: Bodyaches/ Diarrhea x's 3-4 days. Pt states diarrhea has stop but ongoing bodyaches & weakness Is Patient Diabetic? No Pain Assessment Patient in pain? no        Primary Care Provider:  Adella Hare, MD  CC:  Bodyaches/ Diarrhea x's 3-4 days. Pt states diarrhea has stop but ongoing bodyaches & weakness.  History of Present Illness:  Diarrhea      This is a 60 year old man who presents with Diarrhea.  The symptoms began 4 days ago.  The severity is described as moderate.  "turned the corner" last 24h with fewer bouts loose stool - normal BM this afternoon. no fever last 24 h.  The patient reports >6 stools per day, watery/unformed stools, fecal urgency, and fasting diarrhea, but denies blood in stool, mucus in stool, fecal soiling, and nocturnal diarrhea.  Associated symptoms include fever, abdominal cramps, nausea, increased thirst, and weight loss.  The patient denies vomiting and lightheadedness.  The symptoms are worse with any food.  The symptoms are better with fasting and bismuth subsalicylate.  Patient's risk factors for diarrhea: sick contacts at home - wife with same symptoms.  Patient has a  history of bowel resection and UC.    Clinical Review Panels:  Immunizations   Last Flu Vaccine:  Fluvax 3+ (06/17/2010)   Last Pneumovax:  Pneumovax (08/15/2008)  Lipid Management   Cholesterol:  179 (01/09/2008)   LDL (bad choesterol):  98 (01/09/2008)   HDL (good cholesterol):  69.6 (01/09/2008)   Triglycerides:  41 (08/17/2006)  CBC   WBC:  3.5 (06/17/2010)   RBC:  4.11 (06/17/2010)   Hgb:  14.5 (06/17/2010)   Hct:  41.0 (06/17/2010)  Platelets:  168.0 (06/17/2010)   MCV  99.7 (06/17/2010)   MCHC  35.4 (06/17/2010)   RDW  12.3 (06/17/2010)   PMN:  66.2 (06/17/2010)   Lymphs:  16.6 (06/17/2010)   Monos:  12.3 (06/17/2010)   Eosinophils:  3.1 (06/17/2010)   Basophil:  1.8 (06/17/2010)  Complete Metabolic Panel   Glucose:  105 (06/17/2010)   Sodium:  131 (06/17/2010)   Potassium:  5.2 (06/17/2010)   Chloride:  98 (06/17/2010)   CO2:  27 (06/17/2010)   BUN:  16 (06/17/2010)   Creatinine:  1.1 (06/17/2010)   Albumin:  4.2 (06/17/2010)   Total Protein:  6.6 (06/17/2010)   Calcium:  9.4 (06/17/2010)   Total Bili:  1.0 (06/17/2010)   Alk Phos:  41 (06/17/2010)   SGPT (ALT):  25 (06/17/2010)   SGOT (AST):  23 (06/17/2010)   Current Medications (verified): 1)  Metamucil 30.9 %  Powd (Psyllium) .... Two Times A Day 2)  Multivitamins   Tabs (Multiple Vitamin) .... 2 Tablets Once Daily 3)  Cvs Iron 325 (65 Fe) Mg  Tabs (Ferrous Sulfate) .... Once Daily 4)  Cialis 20 Mg  Tabs (Tadalafil) .... As Needed 5)  Naproxen Sodium 220 Mg  Tabs (Naproxen Sodium) .... As Needed 6)  Tylenol Extra Strength 500 Mg  Tabs (Acetaminophen) .... As Needed 7)  Mucinex 600 Mg  Tb12 (Guaifenesin) .... Take 1 Tablet By Mouth Once A Day As Needed 8)  Urocit-K 10 1080 Mg  Tbcr (Potassium Citrate) .... One Tablet By Mouth Two Times A Day 9)  Omeprazole 40 Mg Cpdr (Omeprazole) .... Take 1 Tablet By Mouth Two Times A Day  Allergies (verified): No Known Drug Allergies  Past History:  Past Medical History: UCD Allergic rhinitis  Anemia-iron deficiency ulcerative colitis-status post total abdominal colectomy with ileoanal anastomosis gastric polyps ED GERD Esophageal Stricture - status post upper endoscopy with esophageal dilation  Social History: [employer] grad married 13.5 years - divorced; married '05 no children work: Insurance underwriter Patient is a former smoker. quit in 1981  Alcohol Use - yes social Daily Caffeine Use 2 cups  coffee Illicit Drug Use - no  Review of Systems  The patient denies syncope, peripheral edema, headaches, melena, and hematochezia.    Physical Exam  General:  alert, well-developed, well-nourished, and well-hydrated.  nontoxic Lungs:  normal respiratory effort, normal breath sounds, no crackles, and no wheezes.   Heart:  normal rate and regular rhythm.   Abdomen:  soft, non-tender, normal bowel sounds, no guarding, and no hepatomegaly.     Impression & Recommendations:  Problem # 1:  DIARRHEA (ICD-787.91)  suspect viral infx-  check labs due to hx UC but symptoms seem improved last 24h and no red flags on gx hydrate! - hold abx unless abn labs Orders: TLB-CBC Platelet - w/Differential (85025-CBCD) TLB-BMP (Basic Metabolic Panel-BMET) (74128-[id number])  Discussed symptom control and diet. Call if worsening of symptoms or signs of dehydration.   Problem # 2:  COLITIS, ULCERATIVE NOS (ICD-556.9)  status post total abdominal colectomy with ileoanal pull-through hx. no recent flares  Complete Medication List: 1)  Metamucil 30.9 % Powd (Psyllium) .... Two times a day 2)  Multivitamins Tabs (Multiple vitamin) .... 2 tablets once daily 3)  Cvs Iron 325 (65 Fe) Mg Tabs (Ferrous sulfate) .... Once daily 4)  Cialis 20 Mg Tabs (Tadalafil) .... As needed 5)  Naproxen Sodium 220 Mg Tabs (Naproxen sodium) .... As needed 6)  Tylenol Extra Strength 500 Mg Tabs (Acetaminophen) .... As needed 7)  Mucinex 600 Mg Tb12 (Guaifenesin) .... Take 1 tablet by mouth once a day as needed 8)  Urocit-k 10 1080 Mg Tbcr (Potassium citrate) .... One tablet by mouth two times a day 9)  Omeprazole 40 Mg Cpdr (Omeprazole) .... Take 1 tablet by mouth two times a day  Patient Instructions: 1)  it was good to see you today. 2)  test(s) ordered today - your results will be called to you after review in 48-72 hours from the time of test completion 3)  if you develop worsening symptoms or fever, call us and we  can reconsider antibiotics but it does not appear necessary to use any anitbiotic at this time  4)  hydrate hydrate hydrate as discussed -    Orders Added: 1)  TLB-CBC Platelet - w/Differential [85025-CBCD] 2)  TLB-BMP (Basic Metabolic Panel-BMET) [[id number]] 3)  Est. Patient Level IV [29476]

## 2010-12-30 ENCOUNTER — Telehealth: Payer: Self-pay | Admitting: *Deleted

## 2010-12-30 NOTE — Telephone Encounter (Signed)
Pt was seen by Dr Asa Lente on 3/19 for diarrhea and body aches. He was told it was virus. Monday he began to have same symptoms and is worried that this may not be another virus but wants to know what MD advises. He c/o loose watery stools and all over body aches. No fever and appetite is normal. He is drinking 8 oz of liquid with every BM.   Pt is status post total abdominal colectomy with ileoanal pull-through and has 4 to 5 bm's daily, takes metamucil daily to help keep firm Bm's.

## 2010-12-31 NOTE — Telephone Encounter (Signed)
Pt informed. Will update on status.

## 2010-12-31 NOTE — Telephone Encounter (Signed)
Good news that there is no fever or abdominal pain. Good plan to add additional fluid intake for every loose stool. If he has had antibiotics in the last 4 months he will need stool for C.Diff. Otherwise call for increased problems

## 2011-01-01 ENCOUNTER — Encounter: Payer: Self-pay | Admitting: Internal Medicine

## 2011-01-01 ENCOUNTER — Ambulatory Visit (INDEPENDENT_AMBULATORY_CARE_PROVIDER_SITE_OTHER): Payer: PRIVATE HEALTH INSURANCE | Admitting: Internal Medicine

## 2011-01-01 ENCOUNTER — Other Ambulatory Visit (INDEPENDENT_AMBULATORY_CARE_PROVIDER_SITE_OTHER): Payer: PRIVATE HEALTH INSURANCE

## 2011-01-01 VITALS — BP 104/74 | HR 87 | Temp 98.5°F | Wt 160.0 lb

## 2011-01-01 DIAGNOSIS — K5289 Other specified noninfective gastroenteritis and colitis: Secondary | ICD-10-CM

## 2011-01-01 DIAGNOSIS — K529 Noninfective gastroenteritis and colitis, unspecified: Secondary | ICD-10-CM

## 2011-01-01 LAB — URINALYSIS, ROUTINE W REFLEX MICROSCOPIC
Bilirubin Urine: NEGATIVE
Ketones, ur: NEGATIVE
Leukocytes, UA: NEGATIVE
Nitrite: NEGATIVE
Specific Gravity, Urine: 1.015
Total Protein, Urine: 30
Urine Glucose: NEGATIVE
Urobilinogen, UA: 0.2
pH: 6 (ref 5.0–8.0)

## 2011-01-01 LAB — CBC WITH DIFFERENTIAL/PLATELET
Basophils Absolute: 0 K/uL (ref 0.0–0.1)
Basophils Relative: 0.1 % (ref 0.0–3.0)
Eosinophils Absolute: 0.1 K/uL (ref 0.0–0.7)
Eosinophils Relative: 0.8 % (ref 0.0–5.0)
HCT: 46.2 % (ref 39.0–52.0)
Hemoglobin: 16.3 g/dL (ref 13.0–17.0)
Lymphocytes Relative: 10 % — ABNORMAL LOW (ref 12.0–46.0)
Lymphs Abs: 0.9 K/uL (ref 0.7–4.0)
MCHC: 35.2 g/dL (ref 30.0–36.0)
MCV: 98.8 fl (ref 78.0–100.0)
Monocytes Absolute: 1.6 K/uL — ABNORMAL HIGH (ref 0.1–1.0)
Monocytes Relative: 17.4 % — ABNORMAL HIGH (ref 3.0–12.0)
Neutro Abs: 6.5 K/uL (ref 1.4–7.7)
Neutrophils Relative %: 71.7 % (ref 43.0–77.0)
Platelets: 222 K/uL (ref 150.0–400.0)
RBC: 4.68 Mil/uL (ref 4.22–5.81)
RDW: 12.8 % (ref 11.5–14.6)
WBC: 9 K/uL (ref 4.5–10.5)

## 2011-01-01 MED ORDER — SULFAMETHOXAZOLE-TRIMETHOPRIM 800-160 MG PO TABS: 1.0000 | ORAL_TABLET | Freq: Two times a day (BID) | ORAL | Status: AC

## 2011-01-01 NOTE — Progress Notes (Signed)
  Subjective:    Patient ID: William [name], male    DOB: 1950/12/03, 60 y.o.   MRN: [id number]  HPI Mr. Calderwood was seen approximately 1 month ago by Dr. Asa Lente for generalized discomfort, abdominal pain and diarrhea. Diagnosis was a viral illness and no antibiotics were indicated. His symptoms did resolve. Intercurrently he was seen by Dr. Neldon Mc for a sinus infection but he did receive antibiotics for that episode. 7-10 ago he made a trip to [place] - had a great time, ate great oysters with no after effects. For the past week he has been feeling ill; having low grade fevers, abdominal discomfort and loss of appetite, very loose stools and malaise. He has been assiduously hydrating. He presents for evaluation.  PMH, FamHx and SocHx reviewed for any changes and relevance.     Review of Systems  Constitutional: Positive for fever, activity change, appetite change and fatigue. Negative for chills and diaphoresis.  HENT: Positive for sinus pressure. Negative for hearing loss, ear pain, congestion, facial swelling, sneezing, neck pain, tinnitus and ear discharge.   Eyes: Negative.   Respiratory: Negative for cough, chest tightness, shortness of breath and wheezing.   Cardiovascular: Negative for chest pain and palpitations.  Gastrointestinal: Positive for abdominal pain, diarrhea and abdominal distention. Negative for constipation, blood in stool and anal bleeding.  Genitourinary: Negative for dysuria, urgency, frequency, hematuria and difficulty urinating.  Musculoskeletal: Positive for myalgias and arthralgias. Negative for back pain and gait problem.  Skin: Positive for pallor. Negative for color change, rash and wound.  Neurological: Positive for headaches. Negative for dizziness, syncope, weakness and numbness.  Hematological: Negative.  Negative for adenopathy.  Psychiatric/Behavioral: Negative.        Objective:   Physical Exam  Constitutional: He is oriented to person, place,  and time. He appears well-developed and well-nourished. No distress.  HENT:  Head: Normocephalic and atraumatic.  Right Ear: Tympanic membrane and ear canal normal.  Left Ear: Tympanic membrane and ear canal normal.  Pulmonary/Chest: Effort normal and breath sounds normal. No accessory muscle usage. Not tachypneic and not bradypneic. No respiratory distress.  Abdominal: Soft. Normal appearance and bowel sounds are normal. He exhibits no shifting dullness, no distension, no fluid wave and no mass. There is no hepatosplenomegaly. There is generalized tenderness. There is no rigidity, no rebound, no guarding, no tenderness at McBurney's point and negative Murphy's sign.  Musculoskeletal: Normal range of motion.  Lymphadenopathy:    He has no cervical adenopathy.  Neurological: He is alert and oriented to person, place, and time. He has normal reflexes.  Skin: Skin is warm and dry.  Psychiatric: He has a normal mood and affect. His behavior is normal. Thought content normal.           Assessment & Plan:  1. Diarrhea - patient with diarrhea, abdominal discomfort, fever and malaise. Labs with normal CBC, U/A with few bacteria, 0-2 WBC, RBCs less than 50. Patient does report a h/o nephrolithiasis which may explain the hematuria. He follow with Dr. Gaynelle Arabian on an annual basis   Plan - treat for possible bladder infection septra DS           Copy of note to Dr. Gaynelle Arabian.

## 2011-01-26 NOTE — Op Note (Signed)
NAMEOSMEL, DYKSTRA NO.:  [id number]   MEDICAL RECORD NO.:  [id number]          PATIENT TYPE:  AMB   LOCATION:  [place]                          FACILITY:  [place]   PHYSICIAN:  Jarome Matin Meyerdierks, M.D.DATE OF BIRTH:  October 11, 1950   DATE OF PROCEDURE:  05/03/2007  DATE OF DISCHARGE:                               OPERATIVE REPORT   PREOPERATIVE DIAGNOSIS:  Trigger finger, right long finger.   POSTOPERATIVE DIAGNOSIS:  Trigger finger, right long finger.   PROCEDURE:  Release of A1 pulley right long finger.   SURGEON:  Daryl Eastern, M.D.   ANESTHESIA:  0.25% Marcaine local with sedation.   OPERATIVE FINDINGS:  The patient had nodular formation in the flexor  tendons beneath the A1 pulley.  There was some tenosynovitis.   PROCEDURE:  Under 0.5% Marcaine local anesthesia with a tourniquet on  the right arm, the right hand was prepped and draped in usual fashion  and after exsanguinating the limb, the tourniquet was inflated to 250  mmHg.  A transverse incision was made in line with the distal palmar  crease overlying the third MP joint.  Sharp dissection was carried  through the subcutaneous tissues.  Blunt dissection was carried down to  the flexor tendon and the A1 pulley was incised with a knife.  The  remainder of the pulley was released with the scissors.  The flexor  tendons were then brought out through the wound and did show some  tenosynovitis but no other abnormality.  The tendon was still popping  and so the remainder of the distal portion of the pulley was released.  This resolved the popping problem.  The wound was irrigated with saline.  The skin was closed with 4-0 nylon sutures.  Sterile dressings were  applied.  Tourniquet was released with good circulation of the hand.  The patient went to the recovery room awake and stable in good  condition.      Daryl Eastern, M.D.  Electronically Signed     EMM/MEDQ  D:   05/03/2007  T:  05/04/2007  Job:  [id number]

## 2011-01-29 NOTE — H&P (Signed)
NAME:  William [name], William [name]                ACCOUNT NO.:  [id number]   MEDICAL RECORD NO.:  [id number]          PATIENT TYPE:  INP   LOCATION:  1409                         FACILITY:  [facility]   PHYSICIAN:  Sigmund I. Gaynelle Arabian, M.D.DATE OF BIRTH:  Mar 28, 1951   DATE OF ADMISSION:  10/18/2005  DATE OF DISCHARGE:                                HISTORY & PHYSICAL   HISTORY:  Mr. Cumming is a 60 year old married male, presenting to the office  on October 18, 2005 with severe unremitting acute onset left testicular  pain. No nausea, no vomiting, no gross hematuria, no back pain. The patient  has a history of ulcerative colitis, GERD, psoriasis, but was in good health  until the morning of admission. The pain has caused the patient to be unable  to walk. He was given Toradol in the office but without relief of his pain.  He is admitted to the hospital for IV pain control and further evaluation.   MEDICATIONS:  1.  Prevacid 20 mg 1 p.o. b.i.d.  2.  Metamucil 1 tablespoon b.i.d.  3.  Percocet 1 q.4 h p.r.n. pain.  4.  Multivitamins.  5.  Glucosamine.  6.  Vitamin E.  7.  Saline nasal spray.   TOBACCO AND ALCOHOL:  Tobacco none. Alcohol occasionally.   PAST SURGICAL HISTORY:  Colectomy for ulcerative colitis.   ALLERGIES:  None known.   PHYSICAL EXAMINATION:  GENERAL:  Shows a well-developed white male, in  severe pelvic pain.  VITAL SIGNS:  Blood pressure 161/94, respiratory 20, pulse 68, temperature  98.3.  NECK:  Supple, nontender, no nodes.  CHEST:  Clear to P&A.  COR:  S1, S2 normal without heaves, thrills or murmurs.  ABDOMEN:  Soft, positive bowel sounds without organomegaly or masses. There  is no flank pain.  GENITOURINARY:  Shows normal penis, normal urethra, normal scrotum. The  testicles are distended bilaterally. The left testicle is somewhat tender to  palpation. There is no hard spot in the testicle. The epididymis appears  normal.  RECTAL:  Shows normal sphincter tone,  prostate is 3+ __________, benign, no  masses and no blood.   ASSESSMENT:  Severe left testicular pain. Testicular ultrasound in the  office shows that the patient has a hypoechoic area in the left distal third  of the testicle however, there was no edema, there was no other abnormality  identified. He appears to have decrease in blood supply to this area. The  urinalysis in the office was abnormal, suggestive of possible infection. He  was given Rocephin injection as well as Toradol.   PLAN:  1.  Admit to the hospital.  2.  Will followup with MRI of the testicle to evaluate abnormality of the      testicle.  3.  IV Toradol and pain medication.  4.  If MRI is normal, we will consider neurogenic source of testicle pain,      such as zoster.  5.  Noncontrast pelvic CT to rule out kidney stone, because a small distal      ureteral calculus could cause left testicle pain, although  the patient      has no hematuria, no flank pain, no nausea or vomiting.      Sigmund I. Gaynelle Arabian, M.D.  Electronically Signed     SIT/MEDQ  D:  10/20/2005  T:  10/20/2005  Job:  [id number]

## 2011-01-29 NOTE — Op Note (Signed)
[facility]  Patient:    William [name], William [name] Visit Number: [id number] MRN: [id number]          Service Type: DSU Location: [facility] Attending Physician:  Lowella Petties Dictated by:   Alta Corning, M.D. Proc. Date: 02/21/02 Admit Date:  02/21/2002                             Operative Report  PREOPERATIVE DIAGNOSIS:  Patellofemoral pain.  POSTOPERATIVE DIAGNOSES: 1. Patellofemoral pain. 2. Chondromalacia of the lateral tibial plateau.  PROCEDURES: 1. Chondroplasty, patella. 2. Chondroplasty, lateral tibial plateau. 3. Lateral retinacular release.  SURGEON:  Alta Corning, M.D.  ASSISTANT:  Alvina Filbert. Natividad Brood.  ANESTHESIA:  General.  BRIEF HISTORY:  He is a 60 year old male with a long history of having had significant patellofemoral pain.  He ultimately was evaluated greater than six months ago and ultimately tried all conservative care for treatment of patellofemoral pain.  Because of continued complaints of patellofemoral pain, MRI was obtained which showed no meniscal abnormality.  It did show significant chondromalacia.  Because of his continued significant chondromalacia, the patient was ultimately taken to the operating room for chondroplasty and lateral release.  DESCRIPTION OF PROCEDURE:  The patient was taken to the operating room and after an adequate level of anesthesia was obtained with a general anesthetic, the patient was placed upon the operating table.  The right leg was then prepped and draped in the usual sterile fashion.  Following this, routine arthroscopic examination of the knee revealed there were obvious grade 3 changes of chondromalacia in the patellofemoral joint.  This was debrided with a suction shaver back to a smooth and stable rim.  There was a medial plica which was debrided.  Attention was turned to the medial and lateral compartments, which really were without significant evidence of  abnormality other than the lateral tibial plateau, which had some grade 2 chondral changes.  The tibial plateau was debrided with a suction shaver back to a smooth and stable rim.  Following this, attention was turned toward the patella, where continued chondroplasty was performed.  Following this, a lateral retinacular release was performed to allow superior migration of the patella.  This was completed down from just two fingerbreadths above the patella to at the level of the joint line.  Following this, attention was turned toward the patellar tracking, which was noted to be improved.  Patellar fray was again checked and noted to be smoothed down as best as possible. At this point the knee was copiously irrigated with normal saline irrigation and suctioned dry.  The arthroscopic portals were closed with a bandage.  A lateral bolstered dressing was put in place.  A sterile and compressive dressing was applied and the patient taken to recovery, where she was noted to be in satisfactory condition.  The estimated blood loss for the procedure was none. Dictated by:   Alta Corning, M.D. Attending Physician:  Lowella Petties DD:  02/21/02 TD:  02/23/02 Job: [id number]

## 2011-01-29 NOTE — Discharge Summary (Signed)
NAME:  William [name], William [name]                ACCOUNT NO.:  [id number]   MEDICAL RECORD NO.:  [id number]          PATIENT TYPE:  INP   LOCATION:  1409                         FACILITY:  [facility]   PHYSICIAN:  Sigmund I. Gaynelle Arabian, M.D.DATE OF BIRTH:  [birth year]   DATE OF ADMISSION:  10/18/2005  DATE OF DISCHARGE:  10/23/2005                                 DISCHARGE SUMMARY   FINAL DIAGNOSIS:  Left ureterovesical junction stone.   OPERATION THIS ADMISSION:  Took place on October 22, 2005.  The operation  was cystourethroscopy, left retrograde pyelogram with interpretation,  balloon dilation of left lower ureter, ureteroscopy, basket extraction of  stone, left double-J catheter (Polaris 6 French x 24 cm).   HISTORY:  William [name] is a 60 year old married white male who presented to  the office on October 18, 2005 with severe, unremitting left testicular  pain.  No nausea, no vomiting, no gross hematuria, no flank pain.  He does  have a past history of ulcerative colitis, GERD, psoriasis, but was in good  health until the morning of acute pain.  Ultrasound of the testicle revealed  a possible hypoechoic area in the left testicle.  He was thought to have a  possible testicular tumor or infarct of the testicle.  He was admitted to  the hospital for IV pain control and further evaluation.   ADMISSION MEDICATIONS:  1.  Prevacid 20 mg 1 p.o. daily.  2.  Metamucil 1 tbsp b.i.d.  3.  Percocet 1 q.4 h. p.r.n. pain.  4.  Multivitamin.  5.  Glucosamine.  6.  Vitamin E.  7.  Saline nasal spray.   TOBACCO AND ALCOHOL:  Tobacco is none.  Alcohol is occasional.   PAST SURGICAL HISTORY:  Significant for colectomy for ulcerative colitis.   ALLERGIES:  None.   ADMISSION HISTORY AND PHYSICAL:  As noted in admission H&P of October 18, 2005.   ADMISSION LABORATORIES:  White blood cell count 7,100, hemoglobin 13.4,  hematocrit 38.4.  Sodium 135, potassium 3.4, chloride 105, CO2 24, BUN 14,  creatinine 2.1.   The urinalysis showed specific gravity 1.011, pH 6, with  negative nitrites, leukocyte esterase trace, with 3-6 white cells and 0-2  red cells.   HOSPITAL COURSE:  The patient was admitted to the hospital for pain control.  Underwent Doppler ultrasound of the testicles and MRI of the testicles as  well.  Multiple radiologic interpretations were obtained.  The patient was  finally felt to have a satisfactory blood supply to the testicles  bilaterally.  There was noted to be left-sided hydronephrosis on CT scan,  with a 5 mm left UV junction stone identified.  There was felt to be  hematuria within the ureter.   On October 22, 2005, the patient was taken to the operating room, where he  underwent balloon dilation of the left lower ureter, ureteroscopy, and  extraction of a 5 mm impacted left lower ureteral calculus.  A 24 cm x 6  French Polaris stent was left in position.  The patient was allowed to be  discharged on Percocet for pain  and all other home medications.  He will  also take Septra Double Strength 1 p.o. per day; as well as Pyridium Plus as  he needs it for spasm and burning.  He is allowed to be discharged in good  condition.  He will follow up in the office for follow-up care.      Sigmund I. Gaynelle Arabian, M.D.  Electronically Signed     SIT/MEDQ  D:  10/22/2005  T:  10/25/2005  Job:  [id number]

## 2011-01-29 NOTE — Assessment & Plan Note (Signed)
[facility]                           PRIMARY CARE OFFICE NOTE   NAME:Franchi, JERMARI TAMARGO                       MRN:          [id number]  DATE:10/19/2006                            DOB:          October 29, 1950    Mr. William [name] presents today for a general wellness examination. He was  last seen in the office on 09/28/06, with recurrent sinus pain,  discomfort, and fever. He was treated with Augmentin 875 mg b.i.d. for  10 days with good result. Of note, he has just recently had been treated  for a prolonged sinus infection, which on MRI showed acute maxillary  sinus infection on the right. He subsequently in December did have sinus  surgery with Dr. Ernesto Rutherford.   PAST MEDICAL HISTORY:   PAST SURGICAL HISTORY:  1. Hernia repair age 35.  2. Ileocecal pull-through after colectomy for ulcerative colitis in      1992.  3. Ethmoidectomies and maxillary enterostomies in 10/2002.  4. Repeat sinus surgery with maxillary enterostomy in December 2004.   MEDICAL ILLNESS:  1. Usual child disease.  2. Chronic allergic rhinitis and sinusitis.  3. Ulcerative colitis.  4. Dyspepsia.  5. Erectile dysfunction.  6. Microcytic anemia.  7. Gastric polyps.   PHYSICIANS:  GU - Dr. Gaynelle Arabian.  GI - Dr. Henrene Pastor.  ENT - Dr. Ernesto Rutherford.   CURRENT MEDICATIONS:  1. Prevacid 30 mg b.i.d.  2. Urocit-K 1080 mg b.i.d.  3. Metamucil.  4. Multivitamins.  5. Iron.  6. Cialis 20 mg p.r.n.   HABITS:  Occasional cigar. 1-2 ounces of alcohol per day.   FAMILY HISTORY:  Father with multiple myeloma. Father with a history of  rectal cancer with excision and colostomy. Twin brother in good health.  No history of coronary artery disease.   SOCIAL HISTORY:  The patient is a Land. He was  married 13 1/2 years, divorced, single, and remarried 2005. He works as  a Insurance underwriter. He reports his marriage is going well. He reports he  works out on a regular basis. He is  very attuned to watching his diet  and alcohol intake.   REVIEW OF SYSTEMS:  Negative for any constitutional, cardiovascular,  respiratory, GI, or GU problems.   CHART REVIEW:  Last upper endoscopy 11/02/01 showed esophageal stricture  and hiatal hernia. Last CT sinus is noted on 06/13/06 with right  maxillary sinusitis. Last chest x-ray 04/10/03 that was a negative study.  Last 12 lead electrocardiogram from 09/17/02 which was normal.   PHYSICAL EXAMINATION:  VITAL SIGNS: Temperature 98.1, blood pressure  131/81, pulse 61, weight 170.6.  GENERAL APPEARANCE: Well nourished, athletic appearing gentleman in no  acute distress.  HEENT: Normocephalic atraumatic, AC and TM were unremarkable. Oral  pharynx with native dentition in good repair. No buckle or palatal  lesions were noted. Posterior pharynx was clear. Conjunctiva and sclerae  was clear. PERRLA.  FUNDUSCOPIC: With handheld instrument revealed normal disc margins with  no vascular abnormalities.  NECK: Supple, without thyromegaly.  NODES: No adenopathy was noted in the supraclavicular regions.  CHEST: No CVA  tenderness.  LUNGS: Clear to auscultation and percussion.  CARDIOVASCULAR: 2+ radial pulse. No JVD or carotid bruits. He had a  quiet precordium with a regular rate and rhythm, without murmurs, rubs,  or gallops.  ABDOMEN: Soft, no guarding or rebound. No organomegaly or splenomegaly  was noted.  GENITALIA: Differed to Dr. Gaynelle Arabian.  EXTREMITIES: Without cyanosis, clubbing, edema, or deformity.  NEUROLOGIC: Non-focal.  SKIN: Clear.   DATABASE:  08/17/06 hemoglobin 14.7 grams, white count 5000 with normal  differential, cholesterol 164, triglycerides 41, HDL is 60, LDL was 96.  Chemistries were normal with a serum glucose of 108, kidney function was  normal with a creatinine of 1.1, with a GFR of 74. Thyroid function  normal with a TSH of 1.73, PSA was normal at 0.75, urinalysis was  negative.   ASSESSMENT AND PLAN:  1.  GI. The patient is stable on Prevacid 30 mg b.i.d. and will      continue the same.  2. Chronic sinusitis. The patient is doing well at this point,      following up his surgery and recent bout of infection.  3. Health maintenance. The patient is stable. PSA is normal. He seems      to be doing well. He is exercising on a regular basis and appears      fit.   In summary, this is a pleasant gentleman who seems medically stable at  this time. He will return to see me on a p.r.n. basis.     Heinz Knuckles Norins, MD  Electronically Signed    MEN/MedQ  DD: 10/20/2006  DT: 10/20/2006  Job #: [id number]   cc:   Lilyan Punt

## 2011-01-29 NOTE — Op Note (Signed)
NAME:  William [name], William [name]                ACCOUNT NO.:  [id number]   MEDICAL RECORD NO.:  [id number]          PATIENT TYPE:  INP   LOCATION:  1409                         FACILITY:  [facility]   PHYSICIAN:  Sigmund I. Gaynelle Arabian, M.D.DATE OF BIRTH:  [birth year]   DATE OF PROCEDURE:  10/22/2005  DATE OF DISCHARGE:                                 OPERATIVE REPORT   PREOPERATIVE DIAGNOSIS:  Left ureterovesical junction stone.   POSTOPERATIVE DIAGNOSIS:  Impacted nonprogressive left ureterovesical  junction  stone.   OPERATIONS:  Cystourethroscopy, left retrograde pyelogram with  interpretation, balloon dilation of left ureter, ureteroscopy, basket  extraction of lower ureteral stone, left double-J Polaris [facility]  scientific) (6-French x 24 cm).   PREPARATION:  After appropriate preanesthesia, the patient was brought to  the operating room, placed on the operating table in dorsal supine position  where general LMA anesthesia was introduced. He was then replaced in dorsal  lithotomy position where the pubis was prepped with Betadine solution and  draped in the usual fashion. The patient was given a B & O suppository.   REVIEW OF HISTORY:  This 60 year old male presented with acute left  testicular pain, and by ultrasound was found to have a hypoechoic area in  the testicle, consistent with testicular infarction, or possible tumor. He  was admitted into the hospital for severe testicular pain, with no nausea,  vomiting or flank pain. He was treated with Toradol, as well as IV morphine.  MRI was obtained, as well as repeat ultrasound, with inconclusive results.  Computer scan of the pelvis was accomplished, which showed a left  ureterovesical junction stone, and was thought to be the cause of the  patient's testicular pain. The patient was observed for 24 hours more, and  was noted to have some left flank discomfort, but again with no nausea or  vomiting or typical renal colic. He did have  recurrent testicular pain. He  is now for cystoscopy, retrograde pyelography, and extraction of stone.   DESCRIPTION OF PROCEDURE:  Cystourethroscopy was accomplished which showed a  normal-appearing urethra, with no stricture. The prostate was 3+, globular,  benign.   The bladder itself appeared normal, with definite edema of the left ureteral  tunnel, with a very small and tight ureteral orifice. Retrograde pyelogram  showed what appeared to be a stone at the ureterovesical junction,  consistent with CT scan, follow-up although poorly defined.   A guidewire was passed into the renal pelvis and coiled. A 4-cm balloon  dilator was passed into the ureter, and balloon dilated to 14 atmospheres  pressure, for 3 minutes. This definitely had a visible dilation effect on  the lower ureter. The short ureteroscope was then passed into the ureter,  and a stone was found impacted in the sidewall of the ureter. The stone  appeared to be multifaceted, and rooted in the wall. A four wire basket was  passed over the stone, the stone was extracted with difficulty. Repeat  ureteroscopy was accomplished, and the upper ureter appeared dilated, but  otherwise was normal.   The stone was found in the  bladder, and flushed from the bladder.   With the guidewire in the left renal pelvis, a 6-French x 24 cm Polaris  stent was passed without difficulty into the renal pelvis. The bottom of the  Polaris fit normally into the bladder.   The stone was taken for analysis, photography was developed. The patient was  awakened and given IV Toradol, taken to the recovery room in good condition.      Sigmund I. Gaynelle Arabian, M.D.  Electronically Signed     SIT/MEDQ  D:  10/22/2005  T:  10/25/2005  Job:  [id number]

## 2011-03-11 ENCOUNTER — Encounter: Payer: Self-pay | Admitting: Internal Medicine

## 2011-03-11 ENCOUNTER — Ambulatory Visit (INDEPENDENT_AMBULATORY_CARE_PROVIDER_SITE_OTHER): Payer: PRIVATE HEALTH INSURANCE | Admitting: Internal Medicine

## 2011-03-11 VITALS — BP 116/80 | HR 60 | Ht 68.5 in | Wt 169.2 lb

## 2011-03-11 DIAGNOSIS — K56609 Unspecified intestinal obstruction, unspecified as to partial versus complete obstruction: Secondary | ICD-10-CM

## 2011-03-11 DIAGNOSIS — K219 Gastro-esophageal reflux disease without esophagitis: Secondary | ICD-10-CM

## 2011-03-11 DIAGNOSIS — K222 Esophageal obstruction: Secondary | ICD-10-CM

## 2011-03-11 DIAGNOSIS — D509 Iron deficiency anemia, unspecified: Secondary | ICD-10-CM

## 2011-03-11 DIAGNOSIS — K913 Postprocedural intestinal obstruction, unspecified as to partial versus complete: Secondary | ICD-10-CM

## 2011-03-11 NOTE — Patient Instructions (Signed)
Omeprazole 40 bid     Call if you need refills.

## 2011-03-11 NOTE — Progress Notes (Signed)
HISTORY OF PRESENT ILLNESS:  William [name] is a 60 y.o. male with a remote history of ulcerative colitis for which she is status post total abdominal colectomy with ileoanal anastomosis about 20 years ago. He is followed in this office for GERD complicated by peptic stricture, iron deficiency anemia, and ileoanal anastomotic stricture. He presents today for routine followup. He has not been seen since March of 2010. For his GERD he takes omeprazole 40 mg twice a day. He requires this dosage for symptomatic relief and mucosal healing. He has had no difficulties with breakthrough reflux, dysphagia, or his medication. Next, he remains on iron supplementation chronically. His last hemoglobin in April of 2012 was 16.3. Finally, some problems with difficult bowel movements. Some constipation and some loose stools. He had been on MiraLax, but takes Metamucil daily. No problems with abdominal pain, nausea, or vomiting. Bleeding.  REVIEW OF SYSTEMS:  All non-GI ROS negative except for kidney stones  Past Medical History  Diagnosis Date  . Allergic rhinitis   . Anemia, iron deficiency   . Ulcerative colitis     status post total abdominal colectomy with ileoanal anastomosis  . Gastric polyps   . ED (erectile dysfunction)   . GERD (gastroesophageal reflux disease)   . Esophageal stricture     Status post upper endoscopy with esophageal dilatation  . Kidney stone     Past Surgical History  Procedure Date  . Inguinal hernia repair   . Total abdominal colectomy     with ilial anal pull through (for ulcerative colitis)  . Ethmoidectomy     and maxillary enterostomies '04, redo in 12/04  . Knee arthroscopy 2004    Social History William [name]  reports that he has quit smoking. He has never used smokeless tobacco. He reports that he drinks alcohol. He reports that he does not use illicit drugs.  family history includes Cancer in his father; Dementia in his mother; and Hypertension in his  mother.  No Known Allergies     PHYSICAL EXAMINATION: Vital signs: BP 116/80  Pulse 60  Ht 5' 8.5" (1.74 m)  Wt 169 lb 3.2 oz (76.749 kg)  BMI 25.35 kg/m2 General: Well-developed, well-nourished, no acute distress HEENT: Sclerae are anicteric, conjunctiva pink. Oral mucosa intact Lungs: Clear Heart: Regular Abdomen: soft, nontender, nondistended, no obvious ascites, no peritoneal signs, normal bowel sounds. No organomegaly. Extremities: No edema Psychiatric: alert and oriented x3. Cooperative      ASSESSMENT:  #1. GERD complicated by erosive esophagitis and peptic stricture. He remains asymptomatic post dilation on PPI #2. Remote history of ulcerative colitis status post total colectomy with ileoanal pull-through #3. Ileoanal anastomotic stricture requiring balloon dilatation, most recently March 2010. Seemingly doing well #4. History of iron deficiency anemia. Resolved. Remains on chronic iron   PLAN:  #1. Continue PPI. Refill. #2. Continue iron #3. Routine GI followup in 2 years. Contact the office in the interim for relevant symptomatic problems

## 2011-03-12 ENCOUNTER — Encounter: Payer: Self-pay | Admitting: Internal Medicine

## 2011-03-15 ENCOUNTER — Other Ambulatory Visit: Payer: Self-pay | Admitting: Internal Medicine

## 2011-06-25 LAB — POCT HEMOGLOBIN-HEMACUE: Hemoglobin: 15

## 2011-08-03 DIAGNOSIS — R109 Unspecified abdominal pain: Secondary | ICD-10-CM | POA: Insufficient documentation

## 2011-09-24 ENCOUNTER — Ambulatory Visit: Payer: PRIVATE HEALTH INSURANCE | Admitting: *Deleted

## 2011-10-08 ENCOUNTER — Encounter: Payer: Self-pay | Admitting: Endocrinology

## 2011-10-08 ENCOUNTER — Ambulatory Visit (INDEPENDENT_AMBULATORY_CARE_PROVIDER_SITE_OTHER): Payer: PRIVATE HEALTH INSURANCE | Admitting: Endocrinology

## 2011-10-08 VITALS — BP 158/88 | HR 56 | Temp 97.5°F | Ht 68.5 in | Wt 170.8 lb

## 2011-10-08 DIAGNOSIS — J069 Acute upper respiratory infection, unspecified: Secondary | ICD-10-CM

## 2011-10-08 MED ORDER — CEFUROXIME AXETIL 500 MG PO TABS: 500.0000 mg | ORAL_TABLET | Freq: Two times a day (BID) | ORAL | Status: AC

## 2011-10-08 NOTE — Patient Instructions (Signed)
i have sent a prescription to your pharmacy, for an antibiotic Loratadine-d (non-prescription) will help your congestion. I hope you feel better soon.  If you don't feel better by next week, please call back.

## 2011-10-08 NOTE — Progress Notes (Signed)
  Subjective:    Patient ID: William [name], male    DOB: June 05, 1951, 61 y.o.   MRN: [id number]  HPI Pt states 1 week of moderate congestion in the nose, and assoc myalgias.  He reports chronic sinus probs.   Past Medical History  Diagnosis Date  . Allergic rhinitis   . Anemia, iron deficiency   . Ulcerative colitis     status post total abdominal colectomy with ileoanal anastomosis  . Gastric polyps   . ED (erectile dysfunction)   . GERD (gastroesophageal reflux disease)   . Esophageal stricture     Status post upper endoscopy with esophageal dilatation  . Kidney stone     Past Surgical History  Procedure Date  . Inguinal hernia repair   . Total abdominal colectomy     with ilial anal pull through (for ulcerative colitis)  . Ethmoidectomy     and maxillary enterostomies '04, redo in 12/04  . Knee arthroscopy 2004    History   Social History  . Marital Status: Married    Spouse Name: N/A    Number of Children: N/A  . Years of Education: N/A   Occupational History  . Mortgage Customer service manager    Social History Main Topics  . Smoking status: Former Research scientist (life sciences)  . Smokeless tobacco: Never Used  . Alcohol Use: Yes  . Drug Use: No  . Sexually Active: Not on file   Other Topics Concern  . Not on file   Social History Narrative   [employer] gradMarried 13.5 years- divorced; married '05No childrenWork: mortgage bankerPatient is a former smoker. Quit in 1981Alcohol use- yes socialDaily Caffeine use 2 cups coffee per dayIllicit drug use- no    Current Outpatient Prescriptions on File Prior to Visit  Medication Sig Dispense Refill  . ferrous sulfate 325 (65 FE) MG tablet Take 325 mg by mouth daily with breakfast.        . naproxen sodium (ANAPROX) 220 MG tablet Take 220 mg by mouth as needed.        [name] omeprazole (PRILOSEC) 40 MG capsule take 1 capsule by mouth twice a day  60 capsule  11  . potassium citrate (UROCIT-K 10) 10 MEQ (1080 MG) SR tablet Take 10 mEq by mouth 2 (two) times  daily.       . tadalafil (CIALIS) 20 MG tablet Take 20 mg by mouth daily as needed.          No Known Allergies  Family History  Problem Relation Age of Onset  . Cancer Father     rectal cancer  . Dementia Mother   . Hypertension Mother     BP 158/88  Pulse 56  Temp(Src) 97.5 F (36.4 C) (Oral)  Ht 5' 8.5" (1.74 m)  Wt 170 lb 12.8 oz (77.474 kg)  BMI 25.59 kg/m2  SpO2 96%  Review of Systems Denies earache and fever.      Objective:   Physical Exam VITAL SIGNS:  See vs page GENERAL: no distress head: no deformity eyes: no periorbital swelling, no proptosis external nose and ears are normal mouth: no lesion seen Left tm is red, but the right is normal NECK: There is no palpable thyroid enlargement.  No thyroid nodule is palpable.  No palpable lymphadenopathy at the anterior neck. LUNGS:  Clear to auscultation        Assessment & Plan:  URI, new

## 2011-10-28 ENCOUNTER — Ambulatory Visit (INDEPENDENT_AMBULATORY_CARE_PROVIDER_SITE_OTHER): Payer: PRIVATE HEALTH INSURANCE | Admitting: Internal Medicine

## 2011-10-28 ENCOUNTER — Encounter: Payer: Self-pay | Admitting: Internal Medicine

## 2011-10-28 VITALS — BP 136/86 | HR 74 | Temp 98.1°F | Resp 16 | Wt 166.0 lb

## 2011-10-28 DIAGNOSIS — J329 Chronic sinusitis, unspecified: Secondary | ICD-10-CM

## 2011-10-28 MED ORDER — AZITHROMYCIN 250 MG PO TABS: ORAL_TABLET | ORAL | Status: AC

## 2011-10-28 NOTE — Patient Instructions (Signed)

## 2011-10-28 NOTE — Progress Notes (Signed)
SUBJECTIVE:  William [name] is a 61 y.o. male who complains of congestion, sneezing, sore throat, nasal blockage, post nasal drip, bilateral sinus pain and fever for 1 days. He denies a history of chest pain, dizziness, nausea, sweats and weight loss and denies a history of asthma. Patient does not smoke cigarettes.   OBJECTIVE: He appears well, vital signs are as noted. Ears normal.  Throat and pharynx normal.  Neck supple. No adenopathy in the neck. Nose is congested. Sinuses non tender. The chest is clear, without wheezes or rales.  ASSESSMENT:  sinusitis  PLAN: Symptomatic therapy suggested: push fluids, rest, use vaporizer or mist prn and return office visit prn if symptoms persist or worsen.  Z-pak. Call or return to clinic prn if these symptoms worsen or fail to improve as anticipated.

## 2011-11-03 ENCOUNTER — Telehealth: Payer: Self-pay

## 2011-11-03 NOTE — Telephone Encounter (Signed)
Pt called stating he has completed the ABX but still has mild ST, productive cough. Pt is requesting MD's advisement.

## 2011-11-03 NOTE — Telephone Encounter (Signed)
ceftin 1/27 x 7 days. Azithromycin Feb 14th offers 10 days of antibiotic coverage - through 2/24.  Supportive care: gargle of choice, robitussin DM, APAP, fluids, vitamin C

## 2011-11-05 NOTE — Telephone Encounter (Signed)
LMOM with MD's recommendations.

## 2012-03-06 ENCOUNTER — Ambulatory Visit (INDEPENDENT_AMBULATORY_CARE_PROVIDER_SITE_OTHER): Payer: PRIVATE HEALTH INSURANCE | Admitting: Internal Medicine

## 2012-03-06 ENCOUNTER — Encounter: Payer: Self-pay | Admitting: Internal Medicine

## 2012-03-06 VITALS — BP 124/86 | HR 61 | Temp 98.3°F | Resp 16 | Wt 171.0 lb

## 2012-03-06 DIAGNOSIS — J069 Acute upper respiratory infection, unspecified: Secondary | ICD-10-CM

## 2012-03-06 MED ORDER — AZITHROMYCIN 250 MG PO TABS: ORAL_TABLET | ORAL | Status: AC

## 2012-03-06 NOTE — Progress Notes (Signed)
  Subjective:    Patient ID: William [name], male    DOB: [birth year], 61 y.o.   MRN: [id number]  HPI Mr. Solorio presents for a 3 day h/o sinus congestion, discomfort, minor drainage, no documented fever. No SOB, no breathing trouble. No N/V. He is in no distress.  Past Medical History  Diagnosis Date  . Allergic rhinitis   . Anemia, iron deficiency   . Ulcerative colitis     status post total abdominal colectomy with ileoanal anastomosis  . Gastric polyps   . ED (erectile dysfunction)   . GERD (gastroesophageal reflux disease)   . Esophageal stricture     Status post upper endoscopy with esophageal dilatation  . Kidney stone    Past Surgical History  Procedure Date  . Inguinal hernia repair   . Total abdominal colectomy     with ilial anal pull through (for ulcerative colitis)  . Ethmoidectomy     and maxillary enterostomies '04, redo in 12/04  . Knee arthroscopy 2004   Family History  Problem Relation Age of Onset  . Cancer Father     rectal cancer  . Dementia Mother   . Hypertension Mother    History   Social History  . Marital Status: Married    Spouse Name: N/A    Number of Children: N/A  . Years of Education: N/A   Occupational History  . Mortgage Customer service manager    Social History Main Topics  . Smoking status: Former Research scientist (life sciences)  . Smokeless tobacco: Never Used  . Alcohol Use: Yes  . Drug Use: No  . Sexually Active: Not on file   Other Topics Concern  . Not on file   Social History Narrative   [employer] gradMarried 13.5 years- divorced; married '05No childrenWork: mortgage bankerPatient is a former smoker. Quit in 1981Alcohol use- yes socialDaily Caffeine use 2 cups coffee per dayIllicit drug use- no    Current Outpatient Prescriptions on File Prior to Visit  Medication Sig Dispense Refill  . ferrous sulfate 325 (65 FE) MG tablet Take 325 mg by mouth daily with breakfast.        . naproxen sodium (ANAPROX) 220 MG tablet Take 220 mg by mouth as needed.        [name]  omeprazole (PRILOSEC) 40 MG capsule take 1 capsule by mouth twice a day  60 capsule  11  . potassium citrate (UROCIT-K 10) 10 MEQ (1080 MG) SR tablet Take 10 mEq by mouth 2 (two) times daily.       . tadalafil (CIALIS) 20 MG tablet Take 20 mg by mouth daily as needed.            Review of Systems System review is negative for any constitutional, cardiac, pulmonary, GI or neuro symptoms or complaints other than as described in the HPI.     Objective:   Physical Exam Filed Vitals:   03/06/12 1630  BP: 124/86  Pulse: 61  Temp: 98.3 F (36.8 C)  Resp: 16   Gen'l - WNWD white man in no acute distress HEENT- no tenderness over frontal or maxillary sinuses to percussion Neck - supple Nodes - negative Cor - RRR Pulm - CTAP       Assessment & Plan:  URI - minimal evidence of bacterial infection  Plan Z-pak as directed  Supportive care: fluids, sudafed, etc.

## 2012-03-13 ENCOUNTER — Other Ambulatory Visit: Payer: Self-pay | Admitting: Internal Medicine

## 2012-05-24 ENCOUNTER — Ambulatory Visit (INDEPENDENT_AMBULATORY_CARE_PROVIDER_SITE_OTHER): Payer: PRIVATE HEALTH INSURANCE | Admitting: Internal Medicine

## 2012-05-24 ENCOUNTER — Encounter: Payer: Self-pay | Admitting: Internal Medicine

## 2012-05-24 VITALS — BP 118/78 | HR 62 | Temp 97.2°F | Resp 16 | Wt 172.0 lb

## 2012-05-24 DIAGNOSIS — J019 Acute sinusitis, unspecified: Secondary | ICD-10-CM

## 2012-05-24 MED ORDER — AZITHROMYCIN 500 MG PO TABS: 500.0000 mg | ORAL_TABLET | Freq: Every day | ORAL | Status: AC

## 2012-05-24 NOTE — Progress Notes (Signed)
  Subjective:    Patient ID: William [name], male    DOB: 03-31-1951, 61 y.o.   MRN: [id number]  Sinusitis This is a new problem. The current episode started yesterday. The problem is unchanged. There has been no fever. The fever has been present for less than 1 day. He is experiencing no pain. Associated symptoms include chills, congestion, coughing, sinus pressure and a sore throat. Pertinent negatives include no diaphoresis, ear pain, headaches, hoarse voice, neck pain, shortness of breath, sneezing or swollen glands. Past treatments include oral decongestants. The treatment provided mild relief.      Review of Systems  Constitutional: Positive for chills. Negative for fever, diaphoresis, activity change, appetite change, fatigue and unexpected weight change.  HENT: Positive for congestion, sore throat and sinus pressure. Negative for ear pain, hoarse voice, facial swelling, rhinorrhea, sneezing, trouble swallowing, neck pain, voice change, postnasal drip and ear discharge.   Eyes: Negative.   Respiratory: Positive for cough. Negative for apnea, choking, chest tightness, shortness of breath, wheezing and stridor.   Cardiovascular: Negative for chest pain, palpitations and leg swelling.  Gastrointestinal: Negative.   Genitourinary: Negative.   Musculoskeletal: Negative.   Skin: Negative.   Neurological: Negative.  Negative for headaches.  Hematological: Negative for adenopathy. Does not bruise/bleed easily.  Psychiatric/Behavioral: Negative.        Objective:   Physical Exam  Nursing note and vitals reviewed. Constitutional: He is oriented to person, place, and time. Vital signs are normal. He appears well-developed and well-nourished.  Non-toxic appearance. He does not have a sickly appearance. He does not appear ill. No distress.  HENT:  Head: Normocephalic and atraumatic.  Mouth/Throat: Oropharynx is clear and moist. No oropharyngeal exudate.  Eyes: Conjunctivae normal are  normal. Right eye exhibits no discharge. Left eye exhibits no discharge. No scleral icterus.  Neck: Normal range of motion. Neck supple. No JVD present. No tracheal deviation present. No thyromegaly present.  Cardiovascular: Normal rate, regular rhythm, normal heart sounds and intact distal pulses.  Exam reveals no gallop and no friction rub.   No murmur heard. Pulmonary/Chest: Effort normal and breath sounds normal. No stridor. No respiratory distress. He has no wheezes. He has no rales. He exhibits no tenderness.  Abdominal: Soft. Bowel sounds are normal. He exhibits no distension and no mass. There is no tenderness. There is no rebound and no guarding.  Musculoskeletal: Normal range of motion. He exhibits no edema and no tenderness.  Lymphadenopathy:    He has no cervical adenopathy.  Neurological: He is oriented to person, place, and time.  Skin: Skin is warm and dry. No rash noted. He is not diaphoretic. No erythema. No pallor.  Psychiatric: He has a normal mood and affect. His behavior is normal. Judgment and thought content normal.          Assessment & Plan:

## 2012-05-24 NOTE — Assessment & Plan Note (Signed)
Start zpak for the infection

## 2012-05-24 NOTE — Patient Instructions (Signed)

## 2012-09-25 ENCOUNTER — Encounter: Payer: Self-pay | Admitting: Internal Medicine

## 2012-09-25 ENCOUNTER — Encounter: Payer: PRIVATE HEALTH INSURANCE | Admitting: Internal Medicine

## 2012-09-27 ENCOUNTER — Encounter: Payer: Self-pay | Admitting: Internal Medicine

## 2012-09-27 ENCOUNTER — Ambulatory Visit (INDEPENDENT_AMBULATORY_CARE_PROVIDER_SITE_OTHER): Payer: PRIVATE HEALTH INSURANCE | Admitting: Internal Medicine

## 2012-09-27 VITALS — BP 142/98 | HR 67 | Temp 98.5°F | Wt 177.0 lb

## 2012-09-27 DIAGNOSIS — Z0000 Encounter for general adult medical examination without abnormal findings: Secondary | ICD-10-CM

## 2012-09-27 DIAGNOSIS — J069 Acute upper respiratory infection, unspecified: Secondary | ICD-10-CM

## 2012-09-27 NOTE — Progress Notes (Signed)
  Subjective:    HPI  complains of cough and cold symptoms  Onset 48h ago, wax/wane symptoms- improved at this time with otc meds  Initially associated with sore throat, mild headache and low grade fever Now productive cough with mild chest congestion, symptoms worse in am mod relief with OTC meds ?precipitated by sick contacts  Past Medical History  Diagnosis Date  . Allergic rhinitis   . Anemia, iron deficiency   . Ulcerative colitis     status post total abdominal colectomy with ileoanal anastomosis  . Gastric polyps   . ED (erectile dysfunction)   . GERD (gastroesophageal reflux disease)   . Esophageal stricture     Status post upper endoscopy with esophageal dilatation  . Kidney stone     Review of Systems Constitutional: No unexpected weight change Pulmonary: No pleurisy or hemoptysis Cardiovascular: No chest pain or palpitations     Objective:   Physical Exam BP 142/98  Pulse 67  Temp 98.5 F (36.9 C) (Oral)  Wt 177 lb (80.287 kg)  SpO2 96% GEN: nontoxic appearing and audible chest congestion HENT: NCAT, mild sinus tenderness frontal bilaterally, nares without clear discharge, oropharynx mild erythema, no exudate Eyes: Vision grossly intact, no conjunctivitis Lungs: no rhonchi, no wheeze, no increased work of breathing Cardiovascular: Regular rate and rhythm, no bilateral edema  Lab Results  Component Value Date   WBC 9.0 01/01/2011   HGB 16.3 01/01/2011   HCT 46.2 01/01/2011   PLT 222.0 01/01/2011   GLUCOSE 103* 11/30/2010   CHOL 179 01/09/2008   TRIG 57 01/09/2008   HDL 69.6 01/09/2008   LDLCALC 98 01/09/2008   ALT 25 06/17/2010   AST 23 06/17/2010   NA 130* 11/30/2010   K 5.6* 11/30/2010   CL 103 11/30/2010   CREATININE 1.7* 11/30/2010   BUN 43* 11/30/2010   CO2 19 11/30/2010   TSH 1.31 06/17/2010   PSA 0.88 01/09/2008       Assessment & Plan:  Viral URI  Cough, postnasal drip related to above   Explained lack of efficacy for antibiotics in viral  disease Offered prescription cough suppression, pt prefers OTC  Symptomatic care with Tylenol or Advil, hydration and rest -  salt gargle advised as needed Call if worse or unimproved in next 7 days  CPX labs entered for upcoming OV with PCP 10/05/12

## 2012-09-27 NOTE — Patient Instructions (Signed)
It was good to see you today. If you develop worsening symptoms or fever, call and we can reconsider antibiotics, but it does not appear necessary to use antibiotics at this time. Alternate between ibuprofen and tylenol for aches, pain and fever symptoms as discussed Physical labs entered for Dr. Linda Hedges as discussed - please have labs done anytime week before your appointment when you are fasting Antibiotic Nonuse  Your caregiver felt that the infection or problem was not one that would be helped with an antibiotic. Infections may be caused by viruses or bacteria. Only a caregiver can tell which one of these is the likely cause of an illness. A cold is the most common cause of infection in both adults and children. A cold is a virus. Antibiotic treatment will have no effect on a viral infection. Viruses can lead to many lost days of work caring for sick children and many missed days of school. Children may catch as many as 10 "colds" or "flus" per year during which they can be tearful, cranky, and uncomfortable. The goal of treating a virus is aimed at keeping the ill person comfortable. Antibiotics are medications used to help the body fight bacterial infections. There are relatively few types of bacteria that cause infections but there are hundreds of viruses. While both viruses and bacteria cause infection they are very different types of germs. A viral infection will typically go away by itself within 7 to 10 days. Bacterial infections may spread or get worse without antibiotic treatment. Examples of bacterial infections are:  Sore throats (like strep throat or tonsillitis).   Infection in the lung (pneumonia).   Ear and skin infections.  Examples of viral infections are:  Colds or flus.   Most coughs and bronchitis.   Sore throats not caused by Strep.   Runny noses.  It is often best not to take an antibiotic when a viral infection is the cause of the problem. Antibiotics can kill off  the helpful bacteria that we have inside our body and allow harmful bacteria to start growing. Antibiotics can cause side effects such as allergies, nausea, and diarrhea without helping to improve the symptoms of the viral infection. Additionally, repeated uses of antibiotics can cause bacteria inside of our body to become resistant. That resistance can be passed onto harmful bacterial. The next time you have an infection it may be harder to treat if antibiotics are used when they are not needed. Not treating with antibiotics allows our own immune system to develop and take care of infections more efficiently. Also, antibiotics will work better for [place] when they are prescribed for bacterial infections. Treatments for a child that is ill may include:  Give extra fluids throughout the day to stay hydrated.   Get plenty of rest.   Only give your child over-the-counter or prescription medicines for pain, discomfort, or fever as directed by your caregiver.   The use of a cool mist humidifier may help stuffy noses.   Cold medications if suggested by your caregiver.  Your caregiver may decide to start you on an antibiotic if:  The problem you were seen for today continues for a longer length of time than expected.   You develop a secondary bacterial infection.  SEEK MEDICAL CARE IF:  Fever lasts longer than 5 days.   Symptoms continue to get worse after 5 to 7 days or become severe.   Difficulty in breathing develops.   Signs of dehydration develop (poor drinking, rare urinating,  dark colored urine).   Changes in behavior or worsening tiredness (listlessness or lethargy).  Document Released: 11/08/2001 Document Revised: 11/22/2011 Document Reviewed: 05/07/2009 [facility] Patient Information 2013 [place].   Salt Water Gargle This solution will help make your mouth and throat feel better. HOME CARE INSTRUCTIONS    Mix 1 teaspoon of salt in 8 ounces of warm water.   Gargle with this  solution as much or often as you need or as directed. Swish and gargle gently if you have any sores or wounds in your mouth.   Do not swallow this mixture.  Document Released: 06/03/2004 Document Revised: 11/22/2011 Document Reviewed: 10/25/2008 [facility] Patient Information 2013 [place].

## 2012-09-28 ENCOUNTER — Other Ambulatory Visit (INDEPENDENT_AMBULATORY_CARE_PROVIDER_SITE_OTHER): Payer: PRIVATE HEALTH INSURANCE

## 2012-09-28 DIAGNOSIS — Z0000 Encounter for general adult medical examination without abnormal findings: Secondary | ICD-10-CM

## 2012-09-28 LAB — BASIC METABOLIC PANEL
BUN: 18 mg/dL (ref 6–23)
Creatinine, Ser: 1 mg/dL (ref 0.4–1.5)
GFR: 78.87 mL/min (ref >60.00)

## 2012-09-28 LAB — CBC WITH DIFFERENTIAL/PLATELET
Eosinophils Relative: 4.1 % (ref 0.0–5.0)
MCV: 97.8 fl (ref 78.0–100.0)
Monocytes Absolute: 0.5 10*3/uL (ref 0.1–1.0)
Monocytes Relative: 16 % — ABNORMAL HIGH (ref 3.0–12.0)
Neutrophils Relative %: 66.5 % (ref 43.0–77.0)
Platelets: 162 10*3/uL (ref 150.0–400.0)
WBC: 3.2 10*3/uL — ABNORMAL LOW (ref 4.5–10.5)

## 2012-09-28 LAB — URINALYSIS, ROUTINE W REFLEX MICROSCOPIC
Hgb urine dipstick: NEGATIVE
Ketones, ur: NEGATIVE
Leukocytes, UA: NEGATIVE
Specific Gravity, Urine: >=1.03 (ref 1.000–1.030)
Urobilinogen, UA: 0.2 (ref 0.0–1.0)

## 2012-09-28 LAB — HEPATIC FUNCTION PANEL
ALT: 37 U/L (ref 0–53)
AST: 25 U/L (ref 0–37)
Alkaline Phosphatase: 40 U/L (ref 39–117)
Bilirubin, Direct: 0.1 mg/dL (ref 0.0–0.3)
Total Protein: 6.4 g/dL (ref 6.0–8.3)

## 2012-09-28 LAB — PSA: PSA: 0.99 ng/mL (ref 0.10–4.00)

## 2012-09-28 LAB — LIPID PANEL: Cholesterol: 166 mg/dL (ref 0–200)

## 2012-09-28 LAB — TSH: TSH: 1.75 u[IU]/mL (ref 0.35–5.50)

## 2012-10-02 ENCOUNTER — Ambulatory Visit (INDEPENDENT_AMBULATORY_CARE_PROVIDER_SITE_OTHER): Payer: PRIVATE HEALTH INSURANCE | Admitting: Internal Medicine

## 2012-10-02 ENCOUNTER — Encounter: Payer: Self-pay | Admitting: Internal Medicine

## 2012-10-02 VITALS — BP 160/94 | HR 76 | Temp 97.5°F | Resp 16 | Wt 177.0 lb

## 2012-10-02 DIAGNOSIS — J019 Acute sinusitis, unspecified: Secondary | ICD-10-CM

## 2012-10-02 DIAGNOSIS — R030 Elevated blood-pressure reading, without diagnosis of hypertension: Secondary | ICD-10-CM

## 2012-10-02 DIAGNOSIS — J3489 Other specified disorders of nose and nasal sinuses: Secondary | ICD-10-CM

## 2012-10-02 MED ORDER — AZITHROMYCIN 250 MG PO TABS: ORAL_TABLET | ORAL | Status: DC

## 2012-10-02 NOTE — Patient Instructions (Signed)
It was good to see you today. Zpak antibiotics - Your prescription(s) have been submitted to your pharmacy. Please take as directed and contact our office if you believe you are having problem(s) with the medication(s). Alternate between ibuprofen and tylenol for aches, pain and fever symptoms as discussed Hydrate, rest and call if worse or unimproved

## 2012-10-02 NOTE — Assessment & Plan Note (Signed)
Hx prior sinus surgery x 2, last one 2006 progression of sinus symptoms since seen for URI last week rx Zpack as good results with same in past Intolerant of nasal steroids - continue irrigation and decongestants as ongoing

## 2012-10-02 NOTE — Progress Notes (Signed)
  Subjective:    HPI  complains of head cold symptoms, ?sinusitus Onset >1 week ago, initially improved then relapsing and worse symptoms  First associated with rhinorrhea, sneezing, sore throat, mild headache and low grade fever Now sinus pressure and mod nasal congestion, yellow thick discharge No relief with OTC meds Precipitated by sick contacts and weather change  Past Medical History  Diagnosis Date  . Allergic rhinitis   . Anemia, iron deficiency   . Ulcerative colitis     status post total abdominal colectomy with ileoanal anastomosis  . Gastric polyps   . ED (erectile dysfunction)   . GERD (gastroesophageal reflux disease)   . Esophageal stricture     Status post upper endoscopy with esophageal dilatation  . Kidney stone     Review of Systems Constitutional: No night sweats, no unexpected weight change Pulmonary: No pleurisy or hemoptysis Cardiovascular: No chest pain or palpitations     Objective:   Physical Exam BP 160/94  Pulse 76  Temp 97.5 F (36.4 C) (Oral)  Resp 16  Wt 177 lb (80.287 kg)  SpO2 97% GEN: mildly ill appearing and audible head congestion HENT: NCAT, mild sinus tenderness bilaterally, nares red with thick discharge and turbinate swelling, oropharynx mild erythema and PND, no exudate Eyes: Vision grossly intact, no conjunctivitis Lungs: Clear to auscultation without rhonchi or wheeze, no increased work of breathing Cardiovascular: Regular rate and rhythm, no bilateral edema  Lab Results  Component Value Date   WBC 3.2* 09/28/2012   HGB 13.4 09/28/2012   HCT 38.7* 09/28/2012   PLT 162.0 09/28/2012   GLUCOSE 90 09/28/2012   CHOL 166 09/28/2012   TRIG 50.0 09/28/2012   HDL 66.20 09/28/2012   LDLCALC 90 09/28/2012   ALT 37 09/28/2012   AST 25 09/28/2012   NA 130* 09/28/2012   K 4.4 09/28/2012   CL 100 09/28/2012   CREATININE 1.0 09/28/2012   BUN 18 09/28/2012   CO2 25 09/28/2012   TSH 1.75 09/28/2012   PSA 0.99 09/28/2012      Assessment &  Plan:  Viral URI, seen last week for same and conservative care advised and followed Now progression into acute sinusitis Hx prior sinus dz with surgical intervention for same in past   Empiric antibiotics prescribed due to symptom duration greater than 7 days, progression despite OTC symptomatic care and comorbid disease/medical history  Intol of nasal steroids Symptomatic care with Tylenol or Advil, decongestants, antihistamine, hydration and rest -  Saline irrigation and salt gargle advised as needed

## 2012-10-04 ENCOUNTER — Encounter: Payer: Self-pay | Admitting: Internal Medicine

## 2012-10-04 ENCOUNTER — Ambulatory Visit (INDEPENDENT_AMBULATORY_CARE_PROVIDER_SITE_OTHER): Payer: PRIVATE HEALTH INSURANCE | Admitting: Internal Medicine

## 2012-10-04 VITALS — BP 126/80 | HR 67 | Temp 97.8°F | Resp 10 | Ht 68.5 in | Wt 176.0 lb

## 2012-10-04 DIAGNOSIS — N200 Calculus of kidney: Secondary | ICD-10-CM

## 2012-10-04 DIAGNOSIS — Z8249 Family history of ischemic heart disease and other diseases of the circulatory system: Secondary | ICD-10-CM

## 2012-10-04 DIAGNOSIS — Z23 Encounter for immunization: Secondary | ICD-10-CM

## 2012-10-04 DIAGNOSIS — J329 Chronic sinusitis, unspecified: Secondary | ICD-10-CM

## 2012-10-04 DIAGNOSIS — D509 Iron deficiency anemia, unspecified: Secondary | ICD-10-CM

## 2012-10-04 DIAGNOSIS — F528 Other sexual dysfunction not due to a substance or known physiological condition: Secondary | ICD-10-CM

## 2012-10-04 DIAGNOSIS — K56609 Unspecified intestinal obstruction, unspecified as to partial versus complete obstruction: Secondary | ICD-10-CM

## 2012-10-04 DIAGNOSIS — Z0000 Encounter for general adult medical examination without abnormal findings: Secondary | ICD-10-CM

## 2012-10-04 NOTE — Assessment & Plan Note (Signed)
Summer of '13: left ureteral stone - failed lithotripsy and had ureteral lithotripsy followed by stent for several weeks. He had previous stone several years ago. NOw being well.

## 2012-10-04 NOTE — Assessment & Plan Note (Signed)
Recent infection which has responded well to Z-pak.

## 2012-10-04 NOTE — Progress Notes (Signed)
Subjective:    Patient ID: William [name], male    DOB: [birth year], 62 y.o.   MRN: [id number]  HPI Mr. Kappes presents for routine medical follow up and exam. During the year had a kidney stone attack that required ureteral retrieval.  He has had a couple of sinus infections but otherwise has been doing well. Current with the dentist and eye doctor. Has a regular exercise program at home: cardio and free weights.   He is having right shoulder problems. He has been seeing [facility] and is for MRI of his shoulder. Right knee has also been irritated and he has been doing physical therapy.  His brother, an identical twin,  had mitral valve surgery and he is concerned about any congenital disease.   Past Medical History  Diagnosis Date  . Allergic rhinitis   . Anemia, iron deficiency   . Ulcerative colitis     status post total abdominal colectomy with ileoanal anastomosis  . Gastric polyps   . ED (erectile dysfunction)   . GERD (gastroesophageal reflux disease)   . Esophageal stricture     Status post upper endoscopy with esophageal dilatation  . Kidney stone   . Kidney stone    Past Surgical History  Procedure Date  . Inguinal hernia repair   . Total abdominal colectomy     with ilial anal pull through (for ulcerative colitis)  . Ethmoidectomy     and maxillary enterostomies '04, redo in 12/04  . Knee arthroscopy 2004   Family History  Problem Relation Age of Onset  . Cancer Father     rectal cancer  . Dementia Mother   . Hypertension Mother    History   Social History  . Marital Status: Married    Spouse Name: N/A    Number of Children: N/A  . Years of Education: N/A   Occupational History  . Mortgage Customer service manager    Social History Main Topics  . Smoking status: Former Research scientist (life sciences)  . Smokeless tobacco: Never Used  . Alcohol Use: No  . Drug Use: No  . Sexually Active: Yes   Other Topics Concern  . Not on file   Social History Narrative   [employer] [employer] [facility] gradMarried 13.5  years- divorced; married '05No childrenWork: mortgage bankerPatient is a former smoker. Quit in 1981Alcohol use- yes socialDaily Caffeine use 2 cups coffee per dayIllicit drug use- no    Current Outpatient Prescriptions on File Prior to Visit  Medication Sig Dispense Refill  . azithromycin (ZITHROMAX Z-PAK) 250 MG tablet Take 2 tablets (500 mg) on  Day 1,  followed by 1 tablet (250 mg) once daily on Days 2 through 5.  6 each  0  . ferrous sulfate 325 (65 FE) MG tablet Take 325 mg by mouth daily with breakfast.        . Multiple Vitamin (MULTIVITAMIN) tablet Take 1 tablet by mouth daily.      . naproxen sodium (ANAPROX) 220 MG tablet Take 220 mg by mouth as needed.        [name] omeprazole (PRILOSEC) 40 MG capsule take 1 capsule by mouth twice a day  60 capsule  11  . potassium citrate (UROCIT-K 10) 10 MEQ (1080 MG) SR tablet Take 10 mEq by mouth 2 (two) times daily.       . psyllium (METAMUCIL) 58.6 % powder Take 1 packet by mouth 2 (two) times daily. 1 TBS Twice Daily.      . tadalafil (CIALIS) 20 MG tablet  Take 20 mg by mouth daily as needed.            Review of Systems Constitutional:  Negative for fever, chills, activity change and unexpected weight change.  HEENT:  Posittive for hearing loss - speech frequency. He has not had testing, No ear pain, congestion, neck stiffness and postnasal drip. Negative for sore throat or swallowing problems. Negative for dental complaints.   Eyes: Negative for vision loss or change in visual acuity.  Respiratory: Negative for chest tightness and wheezing. Negative for DOE.   Cardiovascular: Negative for chest pain or palpitations. No decreased exercise tolerance Gastrointestinal: No change in bowel habit. No bloating or gas. No reflux or indigestion Genitourinary: Negative for urgency, frequency, flank pain and difficulty urinating.  Musculoskeletal: Negative for myalgias, back pain. Shoulder and knee arthralgias. No gait problem.  Neurological: Negative  for dizziness, tremors, weakness and headaches.  Hematological: Negative for adenopathy.  Psychiatric/Behavioral: Negative for behavioral problems and dysphoric mood.       Objective:   Physical Exam Filed Vitals:   10/04/12 1010  BP: 126/80  Pulse: 67  Temp: 97.8 F (36.6 C)  Resp: 10   Wt Readings from Last 3 Encounters:  10/04/12 176 lb 0.6 oz (79.851 kg)  10/02/12 177 lb (80.287 kg)  09/27/12 177 lb (80.287 kg)   Gen'l: Well nourished well developed white male in no acute distress  HEENT: Head: Normocephalic and atraumatic. Right Ear: External ear normal. EAC/TM nl. Left Ear: External ear normal.  EAC/TM nl. Nose: Nose normal. Mouth/Throat: Oropharynx is clear and moist. Dentition - native, in good repair. No buccal or palatal lesions. Posterior pharynx clear. Eyes: Conjunctivae and sclera clear. EOM intact. Pupils are equal, round, and reactive to light. Right eye exhibits no discharge. Left eye exhibits no discharge. Neck: Normal range of motion. Neck supple. No JVD present. No tracheal deviation present. No thyromegaly present.  Cardiovascular: Normal rate, regular rhythm, no gallop, no friction rub, no murmur heard.      Quiet precordium. 2+ radial and DP pulses . No carotid bruits Pulmonary/Chest: Effort normal. No respiratory distress or increased WOB, no wheezes, no rales. No chest wall deformity or CVAT. Abdomen: Soft. Bowel sounds are normal in all quadrants. He exhibits no distension, no tenderness, no rebound or guarding, No heptosplenomegaly  Genitourinary: deferred.  Musculoskeletal: Normal range of motion. He exhibits no edema and no tenderness.       Small and large joints without redness, synovial thickening or deformity. Full range of motion preserved about all small, median and large joints.  Lymphadenopathy:    He has no cervical or supraclavicular adenopathy.  Neurological: He is alert and oriented to person, place, and time. CN II-XII intact. DTRs 2+ and  symmetrical biceps, radial and patellar tendons. Cerebellar function normal with no tremor, rigidity, normal gait and station.  Skin: Skin is warm and dry. No rash noted. No erythema.  Psychiatric: He has a normal mood and affect. His behavior is normal. Thought content normal.   Lab Results  Component Value Date   WBC 3.2* 09/28/2012   HGB 13.4 09/28/2012   HCT 38.7* 09/28/2012   PLT 162.0 09/28/2012   GLUCOSE 90 09/28/2012   CHOL 166 09/28/2012   TRIG 50.0 09/28/2012   HDL 66.20 09/28/2012   LDLCALC 90 09/28/2012   ALT 37 09/28/2012   AST 25 09/28/2012   NA 130* 09/28/2012   K 4.4 09/28/2012   CL 100 09/28/2012   CREATININE 1.0 09/28/2012  BUN 18 09/28/2012   CO2 25 09/28/2012   TSH 1.75 09/28/2012   PSA 0.99 09/28/2012          Assessment & Plan:

## 2012-10-04 NOTE — Assessment & Plan Note (Signed)
Good results with Cialis

## 2012-10-04 NOTE — Assessment & Plan Note (Signed)
No problems: no pain or discomfort, no difficulty with bowel movement.   Plan - follow up with Dr. Henrene Pastor as instructed.

## 2012-10-04 NOTE — Assessment & Plan Note (Signed)
Stable with normal Hgb @ 13.4 g

## 2012-10-04 NOTE — Assessment & Plan Note (Signed)
Interval history significant for kidney stone, shoulder pain and a couple of sinus infections. In general he has been doing well. Limited physical exam is normal including a normal cardiac exam w/o mitral valve or any valve murmur. He is current with flex sig. Discussed pros and cons of prostate cancer screening (USPHCTF recommendations reviewed and ACU April '13 recommendations) and he had evaluation Jan 16th with normal PSA. Immunizations are brought up to date and he will check on insurance coverage for Zostavax (shingles vaccine).  In summary - a very nice man who has a bum shoulder and had stones but is generally speaking medically stable. He does take good care of himself - regular exercise and a good diet. He will return as needed and for an annual exam in 1 year.

## 2012-10-12 ENCOUNTER — Ambulatory Visit (HOSPITAL_COMMUNITY): Payer: No Typology Code available for payment source | Attending: Internal Medicine | Admitting: Radiology

## 2012-10-12 DIAGNOSIS — Z136 Encounter for screening for cardiovascular disorders: Secondary | ICD-10-CM | POA: Insufficient documentation

## 2012-10-12 DIAGNOSIS — I379 Nonrheumatic pulmonary valve disorder, unspecified: Secondary | ICD-10-CM | POA: Insufficient documentation

## 2012-10-12 DIAGNOSIS — I080 Rheumatic disorders of both mitral and aortic valves: Secondary | ICD-10-CM | POA: Insufficient documentation

## 2012-10-12 DIAGNOSIS — Z8249 Family history of ischemic heart disease and other diseases of the circulatory system: Secondary | ICD-10-CM | POA: Insufficient documentation

## 2012-10-12 NOTE — Progress Notes (Signed)
Echocardiogram performed.  

## 2012-10-15 ENCOUNTER — Encounter: Payer: Self-pay | Admitting: Internal Medicine

## 2012-10-24 ENCOUNTER — Other Ambulatory Visit (HOSPITAL_COMMUNITY): Payer: Self-pay | Admitting: Internal Medicine

## 2012-10-24 ENCOUNTER — Other Ambulatory Visit (HOSPITAL_COMMUNITY): Payer: Self-pay | Admitting: Cardiology

## 2012-10-24 ENCOUNTER — Ambulatory Visit (HOSPITAL_COMMUNITY): Payer: No Typology Code available for payment source | Attending: Cardiology

## 2012-10-24 DIAGNOSIS — Z8249 Family history of ischemic heart disease and other diseases of the circulatory system: Secondary | ICD-10-CM

## 2012-10-24 MED ORDER — PERFLUTREN PROTEIN A MICROSPH IV SUSP
3.0000 mL | Freq: Once | INTRAVENOUS | Status: AC
  Administered 2012-10-24: 3 mL via INTRAVENOUS

## 2012-10-24 NOTE — Progress Notes (Signed)
Echocardiogram performed.  

## 2013-03-16 ENCOUNTER — Other Ambulatory Visit: Payer: Self-pay | Admitting: Internal Medicine

## 2013-03-20 ENCOUNTER — Ambulatory Visit: Payer: PRIVATE HEALTH INSURANCE

## 2013-03-20 ENCOUNTER — Ambulatory Visit (INDEPENDENT_AMBULATORY_CARE_PROVIDER_SITE_OTHER): Payer: PRIVATE HEALTH INSURANCE

## 2013-03-20 ENCOUNTER — Telehealth: Payer: Self-pay

## 2013-03-20 DIAGNOSIS — Z23 Encounter for immunization: Secondary | ICD-10-CM

## 2013-03-20 DIAGNOSIS — T753XXA Motion sickness, initial encounter: Secondary | ICD-10-CM

## 2013-03-20 NOTE — Telephone Encounter (Signed)
Call in [place] 1 capsule every other day x 4 doses, #4. Is he going to a typhoid risk area?

## 2013-03-20 NOTE — Telephone Encounter (Signed)
Patient came in for a nurse visit for Hep A injection since he will be traveling out of the country. He is asking about oral Typhoid being sent to his pharmacy, [facility] on [address]

## 2013-03-21 MED ORDER — TYPHOID VACCINE PO CPDR: 1.0000 | DELAYED_RELEASE_CAPSULE | ORAL | Status: DC

## 2013-03-21 NOTE — Telephone Encounter (Signed)
Patient notified Vivotif has been sent to the [facility] on [address]. He said he is traveling to [place].

## 2013-04-13 ENCOUNTER — Ambulatory Visit (INDEPENDENT_AMBULATORY_CARE_PROVIDER_SITE_OTHER): Payer: Self-pay | Admitting: Internal Medicine

## 2013-04-13 DIAGNOSIS — Z789 Other specified health status: Secondary | ICD-10-CM

## 2013-04-13 DIAGNOSIS — Z23 Encounter for immunization: Secondary | ICD-10-CM

## 2013-04-13 DIAGNOSIS — Z7189 Other specified counseling: Secondary | ICD-10-CM

## 2013-04-13 DIAGNOSIS — Z7184 Encounter for health counseling related to travel: Secondary | ICD-10-CM

## 2013-04-13 MED ORDER — CIPROFLOXACIN HCL 500 MG PO TABS: 500.0000 mg | ORAL_TABLET | Freq: Two times a day (BID) | ORAL | Status: DC

## 2013-04-13 MED ORDER — ATOVAQUONE-PROGUANIL HCL 250-100 MG PO TABS: 1.0000 | ORAL_TABLET | Freq: Every day | ORAL | Status: DC

## 2013-04-17 DIAGNOSIS — Z7184 Encounter for health counseling related to travel: Secondary | ICD-10-CM | POA: Insufficient documentation

## 2013-04-17 NOTE — Progress Notes (Signed)
  Subjective:    William [name] is a 62 y.o. male who presents to the Infectious Disease clinic for travel consultation. Planned departure date: Aug          Planned return date: 10 days Countries of travel: [place] and [place] Areas in country: tour of parks and Higher education careers adviser reserve   Accommodations: hotel Purpose of travel: vacation Prior travel out of [place]: yes Currently ill / Fever: no History of liver or kidney disease: no  Data Review:  none   Review of Systems n/a    Objective:    n/a    Assessment:    No contraindications to travel. none      Plan:    Issues discussed: environmental concerns, future shots and jet lag., malaria, travel, safety Immunizations recommended: yellow fever Malaria prophylaxis: malarone, daily dose starting 1-2 days before entering endemic area, ending 7 days after leaving area Traveler's diarrhea prophylaxis: ciprofloxacin.

## 2013-06-12 ENCOUNTER — Other Ambulatory Visit: Payer: Self-pay | Admitting: Internal Medicine

## 2013-07-03 ENCOUNTER — Other Ambulatory Visit: Payer: Self-pay | Admitting: Internal Medicine

## 2013-07-24 ENCOUNTER — Encounter: Payer: Self-pay | Admitting: Internal Medicine

## 2013-07-24 ENCOUNTER — Ambulatory Visit (INDEPENDENT_AMBULATORY_CARE_PROVIDER_SITE_OTHER): Payer: Self-pay | Admitting: Internal Medicine

## 2013-07-24 VITALS — BP 134/84 | HR 74 | Ht 68.5 in | Wt 174.0 lb

## 2013-07-24 DIAGNOSIS — K219 Gastro-esophageal reflux disease without esophagitis: Secondary | ICD-10-CM

## 2013-07-24 DIAGNOSIS — K624 Stenosis of anus and rectum: Secondary | ICD-10-CM

## 2013-07-24 DIAGNOSIS — D509 Iron deficiency anemia, unspecified: Secondary | ICD-10-CM

## 2013-07-24 DIAGNOSIS — K222 Esophageal obstruction: Secondary | ICD-10-CM

## 2013-07-24 NOTE — Progress Notes (Signed)
HISTORY OF PRESENT ILLNESS:  William [name] is a 62 y.o. male with a remote history of ulcerative colitis for which he is status post total abdominal colectomy with ileoanal anastomosis over 20 years ago. He is followed in this office for GERD complicated by peptic stricture, iron deficiency anemia, and ileoanal anastomotic stricture requiring balloon dilatation. He was last seen in the office in 03/11/2011. He presents today for routine followup. For GERD he continues on omeprazole 40 mg twice a day. This dosage is required for control of symptoms. He denies recurrent dysphagia. No appreciable medication side effect. He continues on chronic iron therapy. Last hemoglobin in January 2014 was 13.4. In terms of his bowels, he takes Metamucil daily. No longer needing MiraLax. No new GI complaints. His overall health has been good. He recently traveled [place] with his wife  REVIEW OF SYSTEMS:  All non-GI ROS negative entirely  Past Medical History  Diagnosis Date  . Allergic rhinitis   . Anemia, iron deficiency   . Ulcerative colitis     status post total abdominal colectomy with ileoanal anastomosis  . Gastric polyps   . ED (erectile dysfunction)   . GERD (gastroesophageal reflux disease)   . Esophageal stricture     Status post upper endoscopy with esophageal dilatation  . Kidney stone   . Kidney stone   . Inguinal hernia     Past Surgical History  Procedure Laterality Date  . Inguinal hernia repair    . Total abdominal colectomy      with ilial anal pull through (for ulcerative colitis)  . Ethmoidectomy      and maxillary enterostomies '04, redo in 12/04  . Knee arthroscopy  2004  . Kidney stones      removed 2013    Social History HELMUT HENNON  reports that he has quit smoking. He has never used smokeless tobacco. He reports that he does not drink alcohol or use illicit drugs.  family history includes Cancer in his father; Dementia in his mother; Hypertension in his  mother.  No Known Allergies     PHYSICAL EXAMINATION: Vital signs: BP 134/84  Pulse 74  Ht 5' 8.5" (1.74 m)  Wt 174 lb (78.926 kg)  BMI 26.07 kg/m2  SpO2 97% General: Well-developed, well-nourished, no acute distress HEENT: Sclerae are anicteric, conjunctiva pink. Oral mucosa intact Lungs: Clear Heart: Regular Abdomen: soft, nontender, nondistended, no obvious ascites, no peritoneal signs, normal bowel sounds. No organomegaly. Extremities: No edema Psychiatric: alert and oriented x3. Cooperative   ASSESSMENT:  #1. GERD complicated by erosive esophagitis and peptic stricture. He remains asymptomatic post dilation on PPI #2. Remote history of ulcerative colitis status post total abdominal colectomy with ileoanal pull-through #3. Ileoanal anastomotic stricture requiring balloon dilatation. Most recent dilation March 2010. Doing well since #4. History of iron deficiency anemia. Resolved. Remains on chronic iron therapy   PLAN:  #1. Continue reflux precautions #2. Continue PPI. Refill #3. Continue iron #4. Routine office followup, for GI, in 2 years. Sooner if needed.

## 2013-07-24 NOTE — Patient Instructions (Signed)
Please follow up with Dr. Henrene Pastor in 2 years

## 2013-08-29 ENCOUNTER — Other Ambulatory Visit: Payer: Self-pay | Admitting: Internal Medicine

## 2013-09-03 ENCOUNTER — Telehealth: Payer: Self-pay | Admitting: *Deleted

## 2013-09-03 ENCOUNTER — Ambulatory Visit (INDEPENDENT_AMBULATORY_CARE_PROVIDER_SITE_OTHER): Payer: Self-pay | Admitting: Internal Medicine

## 2013-09-03 ENCOUNTER — Telehealth: Payer: Self-pay | Admitting: Internal Medicine

## 2013-09-03 ENCOUNTER — Encounter: Payer: Self-pay | Admitting: Internal Medicine

## 2013-09-03 VITALS — BP 118/86 | HR 90 | Temp 98.4°F | Wt 170.8 lb

## 2013-09-03 DIAGNOSIS — A088 Other specified intestinal infections: Secondary | ICD-10-CM

## 2013-09-03 DIAGNOSIS — A084 Viral intestinal infection, unspecified: Secondary | ICD-10-CM

## 2013-09-03 NOTE — Patient Instructions (Addendum)
Viral Gastroenteritis Viral gastroenteritis is also known as stomach flu. This condition affects the stomach and intestinal tract. It can cause sudden diarrhea and vomiting. The illness typically lasts 3 to 8 days. Most people develop an immune response that eventually gets rid of the virus. While this natural response develops, the virus can make you quite ill. CAUSES  Many different viruses can cause gastroenteritis, such as rotavirus or noroviruses. You can catch one of these viruses by consuming contaminated food or water. You may also catch a virus by sharing utensils or other personal items with an infected person or by touching a contaminated surface. SYMPTOMS  The most common symptoms are diarrhea and vomiting. These problems can cause a severe loss of body fluids (dehydration) and a body salt (electrolyte) imbalance. Other symptoms may include:  Fever.  Headache.  Fatigue.  Abdominal pain. DIAGNOSIS  Your caregiver can usually diagnose viral gastroenteritis based on your symptoms and a physical exam. A stool sample may also be taken to test for the presence of viruses or other infections. TREATMENT  This illness typically goes away on its own. Treatments are aimed at rehydration. The most serious cases of viral gastroenteritis involve vomiting so severely that you are not able to keep fluids down. In these cases, fluids must be given through an intravenous line (IV). HOME CARE INSTRUCTIONS   Drink enough fluids to keep your urine clear or pale yellow. Drink small amounts of fluids frequently and increase the amounts as tolerated.  Ask your caregiver for specific rehydration instructions.  Avoid:  Foods high in sugar.  Alcohol.  Carbonated drinks.  Tobacco.  Juice.  Caffeine drinks.  Extremely hot or cold fluids.  Fatty, greasy foods.  Too much intake of anything at one time.  Dairy products until 24 to 48 hours after diarrhea stops.  You may consume probiotics.  Probiotics are active cultures of beneficial bacteria. They may lessen the amount and number of diarrheal stools in adults. Probiotics can be found in yogurt with active cultures and in supplements.  Wash your hands well to avoid spreading the virus.  Only take over-the-counter or prescription medicines for pain, discomfort, or fever as directed by your caregiver. Do not give aspirin to children. Antidiarrheal medicines are not recommended.  Ask your caregiver if you should continue to take your regular prescribed and over-the-counter medicines.  Keep all follow-up appointments as directed by your caregiver. SEEK IMMEDIATE MEDICAL CARE IF:   You are unable to keep fluids down.  You do not urinate at least once every 6 to 8 hours.  You develop shortness of breath.  You notice blood in your stool or vomit. This may look like coffee grounds.  You have abdominal pain that increases or is concentrated in one small area (localized).  You have persistent vomiting or diarrhea.  You have a fever.  The patient is a child younger than 3 months, and he or she has a fever.  The patient is a child older than 3 months, and he or she has a fever and persistent symptoms.  The patient is a child older than 3 months, and he or she has a fever and symptoms suddenly get worse.  The patient is a baby, and he or she has no tears when crying. MAKE SURE YOU:   Understand these instructions.  Will watch your condition.  Will get help right away if you are not doing well or get worse. Document Released: 08/30/2005 Document Revised: 11/22/2011 Document Reviewed: 06/16/2011   ExitCare Patient Information 2014 [employer].  

## 2013-09-03 NOTE — Telephone Encounter (Signed)
Pt has an appt today 12.22.14 at 2:15 pm.

## 2013-09-03 NOTE — Telephone Encounter (Signed)
Error

## 2013-09-03 NOTE — Telephone Encounter (Signed)
[facility] Triage Call Report Triage Record Num: [id number] Operator: Genevieve Norlander Patient Name: William [name] Call Date & Time: 09/01/2013 9:06:51AM Patient Phone: [phone] PCP: Adella Hare Patient Gender: Male PCP Fax : [phone] Patient DOB: 1951/07/12 Practice Name: [facility] Reason for Call: Caller: Talmage/Patient; PCP: Adella Hare (Adults only); CB#: [phone]; Call regarding Diarrhea onset 08/31/13; generalized aches; weakness; temp 98.5 oral; history of large intestines removed; had been dealing with constipation so had stopped metamucil about 2 weeks ago; Guideline: Diarrhea or Other Change in Bowel Habits with disposition of see provider within 2 weeks for change in character of bowel movements; Appt? no. Protocol(s) Used: Diarrhea or Other Change in Bowel Habits Recommended Outcome per Protocol: See Provider within 2 Weeks Reason for Outcome: Change in character of bowel movements Care Advice: ~ Call provider if symptoms worsen or new symptoms develop. Do not take aspirin, ibuprofen, ketoprofen, naproxen, etc., or other pain relieving medications until consulting with provider. ~ ~ Continue taking prescribed medications as directed until provider is consulted. ~ SYMPTOM / CONDITION MANAGEMENT ~ CAUTIONS Diarrheal Care: - Drink 2-3 quarts (2-3 liters) per day of low sugar content fluids, including over the counter oral hydration solution, unless directed otherwise by provider. - If accompanied by vomiting, take the fluids in frequent small sips or suck on ice chips. - Eat easily digested foods (such as bananas, rice, applesauce, toast, cooked cereals, soup, crackers, baked or boiled potato, or baked chicken or [place] without skin). - Do not eat high fiber, high fat, high sugar content foods, or highly seasoned foods. - Do not drink caffeinated or alcoholic beverages. - Avoid milk and milk products while having symptoms. As symptoms improve, gradually  add back to diet. - Application of A&D ointment or witch hazel medicated pads may help anal irritation. - Antidiarrheal medications are usually unnecessary. If symptoms are severe, consider nonprescription antidiarrheal and anti-motility drugs as directed by label or a provider. Do not take if have high fever or bloody diarrhea. If pregnant, do not take any medications not approved by your provider. - Consult your provider for advice regarding continuing prescription medication. ~ 09/01/2013 9:22:36AM Page 1 of 1 [id number]

## 2013-09-03 NOTE — Telephone Encounter (Signed)
Pt called stated that he has stomach problem (see prior phone note). No appt open today, Please advise.

## 2013-09-03 NOTE — Progress Notes (Signed)
Subjective:    Patient ID: William [name], male    DOB: 07-27-1951, 62 y.o.   MRN: [id number]  HPI William [name] awoke Saturday AM with diffuse myalgias, diarrhea. Spoke with [facility]. He tried to hydrate, wasn't keeping up. Diarrhea has improved, fatigued and achy. No significant fever.   Past Medical History  Diagnosis Date  . Allergic rhinitis   . Anemia, iron deficiency   . Ulcerative colitis     status post total abdominal colectomy with ileoanal anastomosis  . Gastric polyps   . ED (erectile dysfunction)   . GERD (gastroesophageal reflux disease)   . Esophageal stricture     Status post upper endoscopy with esophageal dilatation  . Kidney stone   . Kidney stone   . Inguinal hernia    Past Surgical History  Procedure Laterality Date  . Inguinal hernia repair    . Total abdominal colectomy      with ilial anal pull through (for ulcerative colitis)  . Ethmoidectomy      and maxillary enterostomies '04, redo in 12/04  . Knee arthroscopy  2004  . Kidney stones      removed 2013   Family History  Problem Relation Age of Onset  . Cancer Father     rectal cancer  . Dementia Mother   . Hypertension Mother    History   Social History  . Marital Status: Married    Spouse Name: N/A    Number of Children: N/A  . Years of Education: N/A   Occupational History  . Mortgage Customer service manager    Social History Main Topics  . Smoking status: Former Research scientist (life sciences)  . Smokeless tobacco: Never Used  . Alcohol Use: No  . Drug Use: No  . Sexual Activity: Yes   Other Topics Concern  . Not on file   Social History Narrative   [employer] grad   Married 13.5 years- divorced; married '05   No children   Work: Insurance underwriter   Patient is a former smoker. Quit in 1981   Alcohol use- yes social   Daily Caffeine use 2 cups coffee per day   Illicit drug use- no    Current Outpatient Prescriptions on File Prior to Visit  Medication Sig Dispense Refill  . ferrous sulfate 325 (65 FE) MG  tablet Take 325 mg by mouth daily with breakfast.        . FLUZONE PRESERVATIVE FREE injection       . Multiple Vitamin (MULTIVITAMIN) tablet Take 1 tablet by mouth daily.      . naproxen sodium (ANAPROX) 220 MG tablet Take 220 mg by mouth as needed.        [name] omeprazole (PRILOSEC) 40 MG capsule take 1 capsule by mouth twice a day  60 capsule  3  . potassium citrate (UROCIT-K 10) 10 MEQ (1080 MG) SR tablet Take 10 mEq by mouth 2 (two) times daily.       . psyllium (METAMUCIL) 58.6 % powder Take 1 packet by mouth 2 (two) times daily. 1 TBS Twice Daily.      . tadalafil (CIALIS) 20 MG tablet Take 20 mg by mouth daily as needed.        . typhoid (VIVOTIF) DR capsule Take 1 capsule by mouth every other day.  4 capsule  0   No current facility-administered medications on file prior to visit.      Review of Systems System review is negative for any constitutional, cardiac, pulmonary, GI  or neuro symptoms or complaints other than as described in the HPI.     Objective:   Physical Exam Filed Vitals:   09/03/13 1431  BP: 118/86  Pulse: 90  Temp: 98.4 F (36.9 C)   BP Readings from Last 3 Encounters:  09/03/13 118/86  07/24/13 134/84  10/04/12 126/80   Gen'l- WNWD man in no acute.        Assessment & Plan:  Viral  gastroenteritis - resolving symptoms.  Plan Hydrate  Bland diet

## 2013-09-03 NOTE — Telephone Encounter (Signed)
Per Dr Linda Hedges patient to be scheduled this afternoon

## 2013-09-03 NOTE — Progress Notes (Signed)
Pre visit review using our clinic review tool, if applicable. No additional management support is needed unless otherwise documented below in the visit note. 

## 2013-09-03 NOTE — Telephone Encounter (Signed)
Ok to add on this PM

## 2013-10-02 ENCOUNTER — Encounter: Payer: Self-pay | Admitting: Internal Medicine

## 2013-10-02 ENCOUNTER — Ambulatory Visit (INDEPENDENT_AMBULATORY_CARE_PROVIDER_SITE_OTHER): Payer: 59 | Admitting: Internal Medicine

## 2013-10-02 VITALS — BP 142/84 | HR 70 | Ht 68.5 in | Wt 176.0 lb

## 2013-10-02 DIAGNOSIS — K624 Stenosis of anus and rectum: Secondary | ICD-10-CM

## 2013-10-02 DIAGNOSIS — K5900 Constipation, unspecified: Secondary | ICD-10-CM

## 2013-10-02 DIAGNOSIS — D509 Iron deficiency anemia, unspecified: Secondary | ICD-10-CM

## 2013-10-02 DIAGNOSIS — K219 Gastro-esophageal reflux disease without esophagitis: Secondary | ICD-10-CM

## 2013-10-02 NOTE — Patient Instructions (Addendum)
You have been scheduled for a Flexible Sigmoidoscopy with dilation in our [facility] 10/03/2013

## 2013-10-02 NOTE — Progress Notes (Signed)
HISTORY OF PRESENT ILLNESS:  William [name] is a 63 y.o. male with GERD complicated by peptic stricture, iron deficiency anemia, and a remote history of ulcerative colitis for which she is status post total abdominal colectomy with ileoanal anastomosis over 20 years ago. He also has a history of ileoanal anastomotic stricturing with partial bowel structure and which has required only dilation. He was last seen in the office 07/24/2013 at which time he was doing well. He was continued on PPI for GERD as well as iron for iron deficiency. He presents today with a one to two-week history of difficulty with bowel movements. Previously no difficulty with daily Metamucil. Now requiring MiraLax, which results in bowel movements, but is "messy". He feels like his anastomotic stricture has recurred. No abdominal pain. No nausea vomiting. Other complaints. He last underwent several anastomotic dilations in the spring of 2010. Last dilation May 2010 to maximal diameter of 13.5 mm. He has done well until this time  REVIEW OF SYSTEMS:  All non-GI ROS negative   Past Medical History  Diagnosis Date  . Allergic rhinitis   . Anemia, iron deficiency   . Ulcerative colitis     status post total abdominal colectomy with ileoanal anastomosis  . Gastric polyps   . ED (erectile dysfunction)   . GERD (gastroesophageal reflux disease)   . Esophageal stricture     Status post upper endoscopy with esophageal dilatation  . Kidney stone   . Kidney stone   . Inguinal hernia     Past Surgical History  Procedure Laterality Date  . Inguinal hernia repair    . Total abdominal colectomy      with ilial anal pull through (for ulcerative colitis)  . Ethmoidectomy      and maxillary enterostomies '04, redo in 12/04  . Knee arthroscopy  2004  . Kidney stones      removed 2013    Social History FINTAN GRATER  reports that he has quit smoking. He has never used smokeless tobacco. He reports that he does not drink  alcohol or use illicit drugs.  family history includes Cancer in his father; Dementia in his mother; Hypertension in his mother.  No Known Allergies     PHYSICAL EXAMINATION: Vital signs: BP 142/84  Pulse 70  Ht 5' 8.5" (1.74 m)  Wt 176 lb (79.833 kg)  BMI 26.37 kg/m2  Constitutional: generally well-appearing, no acute distress Psychiatric: alert and oriented x3, cooperative Eyes: extraocular movements intact, anicteric, conjunctiva pink Mouth: oral pharynx moist, no lesions Neck: supple no lymphadenopathy Cardiovascular: heart regular rate and rhythm, no murmur Lungs: clear to auscultation bilaterally Abdomen: soft, nontender, nondistended, no obvious ascites, no peritoneal signs, normal bowel sounds, no organomegaly. Prior surgical incisions well-healed Rectal: Deferred until sigmoidoscopy Extremities: no lower extremity edema bilaterally Skin: no lesions on visible extremities Neuro: No focal deficits.   ASSESSMENT:  #1. Recurrent ileoanal anastomotic stricturing with resultant difficulty with bowel movements #2. GERD complicated by peptic stricture. Asymptomatic post dilation on PPI #3. History of ulcerative colitis status post remote total abdominal colectomy with ileoanal pull-through  PLAN:  #1. Flexible sigmoidoscopy with balloon dilation of the anastomosis.The nature of the procedure, as well as the risks, benefits, and alternatives were carefully and thoroughly reviewed with the patient. Ample time for discussion and questions allowed. The patient understood, was satisfied, and agreed to proceed.

## 2013-10-03 ENCOUNTER — Encounter: Payer: Self-pay | Admitting: Internal Medicine

## 2013-10-03 ENCOUNTER — Ambulatory Visit (AMBULATORY_SURGERY_CENTER): Payer: 59 | Admitting: Internal Medicine

## 2013-10-03 VITALS — BP 148/82 | HR 55 | Temp 97.8°F | Resp 16 | Ht 68.0 in | Wt 176.0 lb

## 2013-10-03 DIAGNOSIS — K624 Stenosis of anus and rectum: Secondary | ICD-10-CM

## 2013-10-03 MED ORDER — SODIUM CHLORIDE 0.9 % IV SOLN: 500.0000 mL | INTRAVENOUS | Status: DC

## 2013-10-03 NOTE — Patient Instructions (Signed)
Discharge instructions given with verbal understanding. Dilatated rectal area. Resume previous medications. YOU HAD AN ENDOSCOPIC PROCEDURE TODAY AT [facility]: Refer to the procedure report that was given to you for any specific questions about what was found during the examination.  If the procedure report does not answer your questions, please call your gastroenterologist to clarify.  If you requested that your care partner not be given the details of your procedure findings, then the procedure report has been included in a sealed envelope for you to review at your convenience later.  YOU SHOULD EXPECT: Some feelings of bloating in the abdomen. Passage of more gas than usual.  Walking can help get rid of the air that was put into your GI tract during the procedure and reduce the bloating. If you had a lower endoscopy (such as a colonoscopy or flexible sigmoidoscopy) you may notice spotting of blood in your stool or on the toilet paper. If you underwent a bowel prep for your procedure, then you may not have a normal bowel movement for a few days.  DIET: Your first meal following the procedure should be a light meal and then it is ok to progress to your normal diet.  A half-sandwich or bowl of soup is an example of a good first meal.  Heavy or fried foods are harder to digest and may make you feel nauseous or bloated.  Likewise meals heavy in dairy and vegetables can cause extra gas to form and this can also increase the bloating.  Drink plenty of fluids but you should avoid alcoholic beverages for 24 hours.  ACTIVITY: Your care partner should take you home directly after the procedure.  You should plan to take it easy, moving slowly for the rest of the day.  You can resume normal activity the day after the procedure however you should NOT DRIVE or use heavy machinery for 24 hours (because of the sedation medicines used during the test).    SYMPTOMS TO REPORT IMMEDIATELY: A  gastroenterologist can be reached at any hour.  During normal business hours, 8:30 AM to 5:00 PM Monday through Friday, call [phone].  After hours and on weekends, please call the GI answering service at [phone] who will take a message and have the physician on call contact you.   Following lower endoscopy (colonoscopy or flexible sigmoidoscopy):  Excessive amounts of blood in the stool  Significant tenderness or worsening of abdominal pains  Swelling of the abdomen that is new, acute  Fever of 100F or higher  FOLLOW UP: If any biopsies were taken you will be contacted by phone or by letter within the next 1-3 weeks.  Call your gastroenterologist if you have not heard about the biopsies in 3 weeks.  Our staff will call the home number listed on your records the next business day following your procedure to check on you and address any questions or concerns that you may have at that time regarding the information given to you following your procedure. This is a courtesy call and so if there is no answer at the home number and we have not heard from you through the emergency physician on call, we will assume that you have returned to your regular daily activities without incident.  SIGNATURES/CONFIDENTIALITY: You and/or your care partner have signed paperwork which will be entered into your electronic medical record.  These signatures attest to the fact that that the information above on your After Visit Summary has been  reviewed and is understood.  Full responsibility of the confidentiality of this discharge information lies with you and/or your care-partner.

## 2013-10-03 NOTE — Progress Notes (Signed)
Called to room to assist during endoscopic procedure.  Patient ID and intended procedure confirmed with present staff. Received instructions for my participation in the procedure from the performing physician.  

## 2013-10-03 NOTE — Op Note (Signed)
[place]  [address] [place], [id number]   FLEXIBLE SIGMOIDOSCOPY PROCEDURE REPORT  PATIENT: William, [name]  MR#: [id number] BIRTHDATE: 05-30-1951 , 62  yrs. old GENDER: Male ENDOSCOPIST: Eustace Quail, MD REFERRED BY: .  Self / Office PROCEDURE DATE:  10/03/2013 PROCEDURE:   Sigmoidoscopy with balloon dilation  12 mm, 13.5 mm, 15 mm diameter ASA CLASS:   Class II INDICATIONS:change in bowel habits. MEDICATIONS: MAC sedation, administered by CRNA and propofol (Diprivan) 275m IV  DESCRIPTION OF PROCEDURE:   After the risks benefits and alternatives of the procedure were thoroughly explained, informed consent was obtained.  revealed no abnormalities of the rectum. The LB [id number] endoscope was introduced through the anus and advanced to the surgical anastomosis , limited by No adverse events experienced.   The quality of the prep was good .  The instrument was then slowly withdrawn as the mucosa was fully examined.         COLON FINDINGS: 8 mm ileoanal stricture which would not permit the passage of the standard upper endoscope proximal.  THERAPY: A 5.5 cm in length sequential balloon with diameters 12.0, 13.5, and 15 mm.  Individual dilations were carried out at each diameter. Successful dilation of the stricture with subsequent evaluation of distal small bowel just proximal.  Patient tolerated the procedure well. Retroflexion was not performed due to a narrow rectal vault. The scope was then withdrawn from the patient and the procedure terminated.  COMPLICATIONS: There were no complications.  ENDOSCOPIC IMPRESSION: 1.   8 mm ileoanal stricture status post dilation to a maximal diameter of 15 mm.  RECOMMENDATIONS: 1. Resume previous medications 2. Followup as needed for recurrent problems   REPEAT EXAM:   _______________________________ eSigned:Eustace Quail MD 10/03/2013 3:08 PM   CTM:AUQJFHLEMarquis Lunch MD The  Patient

## 2013-10-03 NOTE — Progress Notes (Signed)
Report to pacu rn, vss, bbs=clear 

## 2013-10-04 ENCOUNTER — Telehealth: Payer: Self-pay | Admitting: *Deleted

## 2013-10-04 NOTE — Telephone Encounter (Signed)
  Follow up Call-  Call back number 10/03/2013  Post procedure Call Back phone  # [phone]  Permission to leave phone message Yes     Patient questions:  Patient is out of the office and won't be back until tomorrow.  Message left to call us if he checks his messages today.

## 2013-10-31 ENCOUNTER — Ambulatory Visit (INDEPENDENT_AMBULATORY_CARE_PROVIDER_SITE_OTHER): Payer: 59 | Admitting: Internal Medicine

## 2013-10-31 ENCOUNTER — Encounter: Payer: Self-pay | Admitting: Internal Medicine

## 2013-10-31 ENCOUNTER — Other Ambulatory Visit: Payer: Self-pay

## 2013-10-31 VITALS — BP 130/80 | HR 89 | Temp 98.1°F | Wt 176.8 lb

## 2013-10-31 DIAGNOSIS — J069 Acute upper respiratory infection, unspecified: Secondary | ICD-10-CM

## 2013-10-31 MED ORDER — AMOXICILLIN 875 MG PO TABS: 875.0000 mg | ORAL_TABLET | Freq: Two times a day (BID) | ORAL | Status: DC

## 2013-10-31 NOTE — Progress Notes (Signed)
Subjective:    Patient ID: William [name], male    DOB: 10-18-1950, 63 y.o.   MRN: [id number]  HPI 48-72 hrs or URI symptoms: no fever, rhinorrhea, congestion, cough, no N/V/D, periorbital pain and pressure. No SOB or respiratory distress  Past Medical History  Diagnosis Date  . Allergic rhinitis   . Anemia, iron deficiency   . Ulcerative colitis     status post total abdominal colectomy with ileoanal anastomosis  . Gastric polyps   . ED (erectile dysfunction)   . GERD (gastroesophageal reflux disease)   . Esophageal stricture     Status post upper endoscopy with esophageal dilatation  . Kidney stone   . Kidney stone   . Inguinal hernia    Past Surgical History  Procedure Laterality Date  . Inguinal hernia repair    . Total abdominal colectomy      with ilial anal pull through (for ulcerative colitis)  . Ethmoidectomy      and maxillary enterostomies '04, redo in 12/04  . Knee arthroscopy  2004  . Kidney stones      removed 2013   Family History  Problem Relation Age of Onset  . Cancer Father     rectal cancer  . Dementia Mother   . Hypertension Mother    History   Social History  . Marital Status: Married    Spouse Name: N/A    Number of Children: N/A  . Years of Education: N/A   Occupational History  . Mortgage Customer service manager    Social History Main Topics  . Smoking status: Former Research scientist (life sciences)  . Smokeless tobacco: Never Used  . Alcohol Use: No  . Drug Use: No  . Sexual Activity: Yes   Other Topics Concern  . Not on file   Social History Narrative   [employer] grad   Married 13.5 years- divorced; married '05   No children   Work: Insurance underwriter   Patient is a former smoker. Quit in 1981   Alcohol use- yes social   Daily Caffeine use 2 cups coffee per day   Illicit drug use- no    Current Outpatient Prescriptions on File Prior to Visit  Medication Sig Dispense Refill  . ferrous sulfate 325 (65 FE) MG tablet Take 325 mg by mouth daily with breakfast.         . FLUZONE PRESERVATIVE FREE injection       . Multiple Vitamin (MULTIVITAMIN) tablet Take 1 tablet by mouth daily.      . naproxen sodium (ANAPROX) 220 MG tablet Take 220 mg by mouth as needed.        [name] omeprazole (PRILOSEC) 40 MG capsule take 1 capsule by mouth twice a day  60 capsule  3  . potassium citrate (UROCIT-K 10) 10 MEQ (1080 MG) SR tablet Take 10 mEq by mouth 2 (two) times daily.       . psyllium (METAMUCIL) 58.6 % powder Take 1 packet by mouth 2 (two) times daily. 1 TBS Twice Daily.      . tadalafil (CIALIS) 20 MG tablet Take 20 mg by mouth daily as needed.        . typhoid (VIVOTIF) DR capsule Take 1 capsule by mouth every other day.  4 capsule  0   No current facility-administered medications on file prior to visit.      Review of Systems System review is negative for any constitutional, cardiac, pulmonary, GI or neuro symptoms or complaints other than as  described in the HPI.     Objective:   Physical Exam Filed Vitals:   10/31/13 1504  BP: 130/80  Pulse: 89  Temp: 98.1 F (36.7 C)   Gen'l- WNWD man in no distress HEENT- TMs normal, no sinus tenderness to percussion, throat clear Neck supple,  Nodes - negative Cor - RRR Pulm - CTAP       Assessment & Plan:  Mild upper respiratory infection with no alarm symptoms on exam: normal ear drums, throat is clear, no enlarged lymph nodes no fever.  Plan Amoxicillin 875 mg twice a day x 7 days  Supportive care: nasal saline spray, sudafed 30 mg 2 or 3 times a day, continue mucinex and loratadine.  Vitamin c  Hydrate.

## 2013-10-31 NOTE — Patient Instructions (Signed)
Mild upper respiratory infection with no alarm symptoms on exam: normal ear drums, throat is clear, no enlarged lymph nodes no fever.  Plan Amoxicillin 875 mg twice a day x 7 days  Supportive care: nasal saline spray, sudafed 30 mg 2 or 3 times a day, continue mucinex and loratadine.  Vitamin c  Hydrate.

## 2013-10-31 NOTE — Progress Notes (Signed)
Pre visit review using our clinic review tool, if applicable. No additional management support is needed unless otherwise documented below in the visit note. 

## 2013-12-07 ENCOUNTER — Ambulatory Visit (INDEPENDENT_AMBULATORY_CARE_PROVIDER_SITE_OTHER): Payer: 59 | Admitting: Physician Assistant

## 2013-12-07 ENCOUNTER — Encounter: Payer: Self-pay | Admitting: Physician Assistant

## 2013-12-07 VITALS — BP 127/83 | HR 71 | Temp 98.3°F | Wt 178.0 lb

## 2013-12-07 DIAGNOSIS — R059 Cough, unspecified: Secondary | ICD-10-CM

## 2013-12-07 DIAGNOSIS — J329 Chronic sinusitis, unspecified: Secondary | ICD-10-CM

## 2013-12-07 DIAGNOSIS — R05 Cough: Secondary | ICD-10-CM

## 2013-12-07 MED ORDER — BENZONATATE 100 MG PO CAPS: 100.0000 mg | ORAL_CAPSULE | Freq: Two times a day (BID) | ORAL | Status: DC | PRN

## 2013-12-07 MED ORDER — AZITHROMYCIN 250 MG PO TABS: ORAL_TABLET | ORAL | Status: DC

## 2013-12-07 NOTE — Progress Notes (Signed)
Pre-visit discussion using our clinic review tool. No additional management support is needed unless otherwise documented below in the visit note.  

## 2013-12-07 NOTE — Assessment & Plan Note (Signed)
Rx Azithromycin.  Increase fluids.  Rest.  Saline nasal spray.  Rx Tessalon Perles for cough.  Humidifier in bedroom.  Call or return to clinic if symptoms are not improving.

## 2013-12-07 NOTE — Patient Instructions (Signed)
Take antibiotic as directed.  Increase fluid intake.  Rest.  Use Saline nasal spray.  Place a humidifier in the bedroom.  Plain Mucinex if needed for chest congestion.  Use Tessalon Perles as directed for cough.  Call or return to clinic if symptoms are not improving.  Sinusitis Sinusitis is redness, soreness, and swelling (inflammation) of the paranasal sinuses. Paranasal sinuses are air pockets within the bones of your face (beneath the eyes, the middle of the forehead, or above the eyes). In healthy paranasal sinuses, mucus is able to drain out, and air is able to circulate through them by way of your nose. However, when your paranasal sinuses are inflamed, mucus and air can become trapped. This can allow bacteria and other germs to grow and cause infection. Sinusitis can develop quickly and last only a short time (acute) or continue over a long period (chronic). Sinusitis that lasts for more than 12 weeks is considered chronic.  CAUSES  Causes of sinusitis include:  Allergies.  Structural abnormalities, such as displacement of the cartilage that separates your nostrils (deviated septum), which can decrease the air flow through your nose and sinuses and affect sinus drainage.  Functional abnormalities, such as when the small hairs (cilia) that line your sinuses and help remove mucus do not work properly or are not present. SYMPTOMS  Symptoms of acute and chronic sinusitis are the same. The primary symptoms are pain and pressure around the affected sinuses. Other symptoms include:  Upper toothache.  Earache.  Headache.  Bad breath.  Decreased sense of smell and taste.  A cough, which worsens when you are lying flat.  Fatigue.  Fever.  Thick drainage from your nose, which often is green and may contain pus (purulent).  Swelling and warmth over the affected sinuses. DIAGNOSIS  Your caregiver will perform a physical exam. During the exam, your caregiver may:  Look in your nose  for signs of abnormal growths in your nostrils (nasal polyps).  Tap over the affected sinus to check for signs of infection.  View the inside of your sinuses (endoscopy) with a special imaging device with a light attached (endoscope), which is inserted into your sinuses. If your caregiver suspects that you have chronic sinusitis, one or more of the following tests may be recommended:  Allergy tests.  Nasal culture A sample of mucus is taken from your nose and sent to a lab and screened for bacteria.  Nasal cytology A sample of mucus is taken from your nose and examined by your caregiver to determine if your sinusitis is related to an allergy. TREATMENT  Most cases of acute sinusitis are related to a viral infection and will resolve on their own within 10 days. Sometimes medicines are prescribed to help relieve symptoms (pain medicine, decongestants, nasal steroid sprays, or saline sprays).  However, for sinusitis related to a bacterial infection, your caregiver will prescribe antibiotic medicines. These are medicines that will help kill the bacteria causing the infection.  Rarely, sinusitis is caused by a fungal infection. In theses cases, your caregiver will prescribe antifungal medicine. For some cases of chronic sinusitis, surgery is needed. Generally, these are cases in which sinusitis recurs more than 3 times per year, despite other treatments. HOME CARE INSTRUCTIONS   Drink plenty of water. Water helps thin the mucus so your sinuses can drain more easily.  Use a humidifier.  Inhale steam 3 to 4 times a day (for example, sit in the bathroom with the shower running).  Apply a  warm, moist washcloth to your face 3 to 4 times a day, or as directed by your caregiver.  Use saline nasal sprays to help moisten and clean your sinuses.  Take over-the-counter or prescription medicines for pain, discomfort, or fever only as directed by your caregiver. SEEK IMMEDIATE MEDICAL CARE IF:  You  have increasing pain or severe headaches.  You have nausea, vomiting, or drowsiness.  You have swelling around your face.  You have vision problems.  You have a stiff neck.  You have difficulty breathing. MAKE SURE YOU:   Understand these instructions.  Will watch your condition.  Will get help right away if you are not doing well or get worse. Document Released: 08/30/2005 Document Revised: 11/22/2011 Document Reviewed: 09/14/2011 [facility] Patient Information 2014 [place], [place].

## 2013-12-07 NOTE — Progress Notes (Signed)
Patient presents to clinic today c/o sinus pressure, sinus pain, L ear pain, post-nasal drip and non-productive cough.  Denies fever, chills, shortness of breath or wheezing.  Patient with history of frequent sinusitis and hx of sinus surgery.  Denies recent travel or sick contact.  Past Medical History  Diagnosis Date  . Allergic rhinitis   . Anemia, iron deficiency   . Ulcerative colitis     status post total abdominal colectomy with ileoanal anastomosis  . Gastric polyps   . ED (erectile dysfunction)   . GERD (gastroesophageal reflux disease)   . Esophageal stricture     Status post upper endoscopy with esophageal dilatation  . Kidney stone   . Kidney stone   . Inguinal hernia    Current Outpatient Prescriptions on File Prior to Visit  Medication Sig Dispense Refill  . ferrous sulfate 325 (65 FE) MG tablet Take 325 mg by mouth daily with breakfast.        . FLUZONE PRESERVATIVE FREE injection       . Multiple Vitamin (MULTIVITAMIN) tablet Take 1 tablet by mouth daily.      . naproxen sodium (ANAPROX) 220 MG tablet Take 220 mg by mouth as needed.        [name] omeprazole (PRILOSEC) 40 MG capsule take 1 capsule by mouth twice a day  60 capsule  3  . potassium citrate (UROCIT-K 10) 10 MEQ (1080 MG) SR tablet Take 10 mEq by mouth 2 (two) times daily.       . psyllium (METAMUCIL) 58.6 % powder Take 1 packet by mouth 2 (two) times daily. 1 TBS Twice Daily.      . tadalafil (CIALIS) 20 MG tablet Take 20 mg by mouth daily as needed.         No current facility-administered medications on file prior to visit.    No Known Allergies  Family History  Problem Relation Age of Onset  . Cancer Father     rectal cancer  . Dementia Mother   . Hypertension Mother     History   Social History  . Marital Status: Married    Spouse Name: N/A    Number of Children: N/A  . Years of Education: N/A   Occupational History  . Mortgage Customer service manager    Social History Main Topics  . Smoking status:  Former Research scientist (life sciences)  . Smokeless tobacco: Never Used  . Alcohol Use: No  . Drug Use: No  . Sexual Activity: Yes   Other Topics Concern  . None   Social History Narrative   [employer] grad   Married 13.5 years- divorced; married '05   No children   Work: Insurance underwriter   Patient is a former smoker. Quit in 1981   Alcohol use- yes social   Daily Caffeine use 2 cups coffee per day   Illicit drug use- no   Review of Systems - See HPI.  All other ROS are negative.  BP 127/83  Pulse 71  Temp(Src) 98.3 F (36.8 C) (Oral)  Wt 178 lb (80.74 kg)  SpO2 97%  Physical Exam  Constitutional: He is oriented to person, place, and time and well-developed, well-nourished, and in no distress.  HENT:  Head: Normocephalic and atraumatic.  Right Ear: External ear normal.  Left Ear: External ear normal.  Nose: Nose normal.  Mouth/Throat: Oropharynx is clear and moist. No oropharyngeal exudate.  TM within normal limits bilaterally.  TTP of sinuses.  Eyes: Conjunctivae are normal. Pupils are equal,  round, and reactive to light.  Neck: Neck supple.  Cardiovascular: Normal rate, regular rhythm, normal heart sounds and intact distal pulses.   Pulmonary/Chest: Effort normal and breath sounds normal. No respiratory distress. He has no wheezes. He has no rales. He exhibits no tenderness.  Lymphadenopathy:    He has no cervical adenopathy.  Neurological: He is alert and oriented to person, place, and time.  Skin: Skin is warm and dry. No rash noted.  Psychiatric: Affect normal.   Assessment/Plan: Sinusitis Rx Azithromycin.  Increase fluids.  Rest.  Saline nasal spray.  Rx Tessalon Perles for cough.  Humidifier in bedroom.  Call or return to clinic if symptoms are not improving.

## 2014-01-01 ENCOUNTER — Other Ambulatory Visit: Payer: Self-pay | Admitting: Internal Medicine

## 2014-03-21 ENCOUNTER — Encounter: Payer: Self-pay | Admitting: Internal Medicine

## 2014-03-21 ENCOUNTER — Ambulatory Visit (INDEPENDENT_AMBULATORY_CARE_PROVIDER_SITE_OTHER): Payer: 59 | Admitting: Internal Medicine

## 2014-03-21 VITALS — BP 140/90 | HR 64 | Temp 98.6°F | Wt 171.0 lb

## 2014-03-21 DIAGNOSIS — H6590 Unspecified nonsuppurative otitis media, unspecified ear: Secondary | ICD-10-CM | POA: Insufficient documentation

## 2014-03-21 DIAGNOSIS — H6500 Acute serous otitis media, unspecified ear: Secondary | ICD-10-CM

## 2014-03-21 MED ORDER — AZITHROMYCIN 250 MG PO TABS: ORAL_TABLET | ORAL | Status: DC

## 2014-03-21 NOTE — Progress Notes (Signed)
   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  HENT: Positive for congestion and ear pain. Negative for dental problem, hearing loss, mouth sores, sore throat and voice change.   Eyes: Negative for pain.       Objective:   Physical Exam  Constitutional: He is oriented to person, place, and time. He appears well-developed. No distress.  NAD  HENT:  Mouth/Throat: Oropharynx is clear and moist. No oropharyngeal exudate.  Fluid behind L TM Hair clipping in the L ear canal next to TM  Eyes: Conjunctivae are normal. Pupils are equal, round, and reactive to light.  Neck: Normal range of motion. No JVD present. No thyromegaly present.  Cardiovascular: Normal rate, regular rhythm, normal heart sounds and intact distal pulses.  Exam reveals no gallop and no friction rub.   No murmur heard. Pulmonary/Chest: Effort normal and breath sounds normal. No respiratory distress. He has no wheezes. He has no rales. He exhibits no tenderness.  Abdominal: Soft. Bowel sounds are normal. He exhibits no distension and no mass. There is no tenderness. There is no rebound and no guarding.  Musculoskeletal: Normal range of motion. He exhibits no edema and no tenderness.  Lymphadenopathy:    He has no cervical adenopathy.  Neurological: He is alert and oriented to person, place, and time. He has normal reflexes. No cranial nerve deficit. He exhibits normal muscle tone. He displays a negative Romberg sign. Coordination and gait normal.  No meningeal signs  Skin: Skin is warm and dry. No rash noted.  Psychiatric: He has a normal mood and affect. His behavior is normal. Judgment and thought content normal.    L ear was irrigated w/water      Assessment & Plan:

## 2014-03-21 NOTE — Assessment & Plan Note (Addendum)
Z pac  L ear was irrigated w/water: hair clipping

## 2014-03-21 NOTE — Progress Notes (Signed)
Pre visit review using our clinic review tool, if applicable. No additional management support is needed unless otherwise documented below in the visit note. 

## 2014-03-22 ENCOUNTER — Encounter: Payer: Self-pay | Admitting: Internal Medicine

## 2014-04-25 ENCOUNTER — Encounter: Payer: Self-pay | Admitting: Internal Medicine

## 2014-04-25 ENCOUNTER — Other Ambulatory Visit (INDEPENDENT_AMBULATORY_CARE_PROVIDER_SITE_OTHER): Payer: 59

## 2014-04-25 ENCOUNTER — Telehealth: Payer: Self-pay | Admitting: Internal Medicine

## 2014-04-25 ENCOUNTER — Ambulatory Visit (INDEPENDENT_AMBULATORY_CARE_PROVIDER_SITE_OTHER): Payer: 59 | Admitting: Internal Medicine

## 2014-04-25 VITALS — BP 142/88 | HR 66 | Temp 98.2°F | Resp 16 | Ht 68.5 in | Wt 178.0 lb

## 2014-04-25 DIAGNOSIS — I10 Essential (primary) hypertension: Secondary | ICD-10-CM

## 2014-04-25 DIAGNOSIS — R51 Headache: Secondary | ICD-10-CM

## 2014-04-25 DIAGNOSIS — R519 Headache, unspecified: Secondary | ICD-10-CM | POA: Insufficient documentation

## 2014-04-25 LAB — COMPREHENSIVE METABOLIC PANEL
ALT: 31 U/L (ref 0–53)
AST: 23 U/L (ref 0–37)
Albumin: 4 g/dL (ref 3.5–5.2)
Alkaline Phosphatase: 32 U/L — ABNORMAL LOW (ref 39–117)
BILIRUBIN TOTAL: 1 mg/dL (ref 0.2–1.2)
BUN: 18 mg/dL (ref 6–23)
CO2: 25 mEq/L (ref 19–32)
Calcium: 9 mg/dL (ref 8.4–10.5)
Chloride: 98 mEq/L (ref 96–112)
Creatinine, Ser: 1 mg/dL (ref 0.4–1.5)
GFR: 84.15 mL/min (ref >60.00)
Glucose, Bld: 92 mg/dL (ref 70–99)
Potassium: 4.2 mEq/L (ref 3.5–5.1)
Sodium: 130 mEq/L — ABNORMAL LOW (ref 135–145)
TOTAL PROTEIN: 6.4 g/dL (ref 6.0–8.3)

## 2014-04-25 LAB — CBC WITH DIFFERENTIAL/PLATELET
Basophils Absolute: 0 10*3/uL (ref 0.0–0.1)
Basophils Relative: 0.7 % (ref 0.0–3.0)
Eosinophils Absolute: 0.1 10*3/uL (ref 0.0–0.7)
Eosinophils Relative: 2.4 % (ref 0.0–5.0)
HEMATOCRIT: 38.8 % — AB (ref 39.0–52.0)
Hemoglobin: 13.7 g/dL (ref 13.0–17.0)
LYMPHS ABS: 0.7 10*3/uL (ref 0.7–4.0)
Lymphocytes Relative: 16.3 % (ref 12.0–46.0)
MCHC: 35.2 g/dL (ref 30.0–36.0)
MCV: 98.8 fl (ref 78.0–100.0)
MONO ABS: 0.5 10*3/uL (ref 0.1–1.0)
MONOS PCT: 10.2 % (ref 3.0–12.0)
Neutro Abs: 3.2 10*3/uL (ref 1.4–7.7)
Neutrophils Relative %: 70.4 % (ref 43.0–77.0)
PLATELETS: 190 10*3/uL (ref 150.0–400.0)
RBC: 3.93 Mil/uL — ABNORMAL LOW (ref 4.22–5.81)
RDW: 12.3 % (ref 11.5–15.5)
WBC: 4.5 10*3/uL (ref 4.0–10.5)

## 2014-04-25 LAB — SEDIMENTATION RATE: Sed Rate: 5 mm/hr (ref 0–22)

## 2014-04-25 LAB — TSH: TSH: 1.38 u[IU]/mL (ref 0.35–4.50)

## 2014-04-25 MED ORDER — SUMATRIPTAN-NAPROXEN SODIUM 85-500 MG PO TABS: 1.0000 | ORAL_TABLET | ORAL | Status: DC | PRN

## 2014-04-25 NOTE — Telephone Encounter (Signed)
Patient Information: Caller Name: Clair Gulling Phone: [phone] Patient: Abrham, Maslowski Gender: Male DOB: 07/24/1951 Age: 63 Years PCP: Plotnikov, Alex (Adults only)  Office Follow Up: Does the office need to follow up with this patient?: No Instructions For The Office: N/A  RN Note: Headache is not severe but does still have dull frontal HA. No fever noted. Patient mentions he always has Sinus issues but symptoms are no worse than they normally are.  Triaged by Sharol Roussel, RN.   Symptoms Reason For Call & Symptoms: Headache and fatigue. Reviewed Health History In EMR: Yes Reviewed Medications In EMR: Yes Reviewed Allergies In EMR: Yes Reviewed Surgeries / Procedures: Yes Date of Onset of Symptoms: 04/24/2014 Treatments Tried: Ibuprofen 400 mg Treatments Tried Worked: Yes  Guideline(s) Used: Headache  Disposition Per Guideline:   See Today or Tomorrow in Office  Reason For Disposition Reached:   Unexplained headache that is present > 24 hours  Advice Given: N/A  Patient Will Follow Care Advice: YES  Appointment Scheduled: 04/25/2014 13:45:00 Appointment Scheduled Provider: Scarlette Calico (Adults only)

## 2014-04-25 NOTE — Progress Notes (Signed)
Pre visit review using our clinic review tool, if applicable. No additional management support is needed unless otherwise documented below in the visit note. 

## 2014-04-25 NOTE — Progress Notes (Signed)
Subjective:    Patient ID: William [name], male    DOB: 25-Jan-1951, 63 y.o.   MRN: [id number]  Headache  This is a recurrent (he has had headaches intermittently for years) problem. Episode onset: 2 days ago. The problem occurs intermittently. The problem has been unchanged. The pain is located in the frontal and left unilateral region. The pain quality is similar to prior headaches. The quality of the pain is described as aching. The pain is at a severity of 2/10. The pain is mild. Pertinent negatives include no abdominal pain, abnormal behavior, anorexia, back pain, blurred vision, coughing, dizziness, drainage, ear pain, eye pain, eye redness, eye watering, facial sweating, fever, hearing loss, insomnia, loss of balance, muscle aches, nausea, neck pain, numbness, phonophobia, photophobia, rhinorrhea, scalp tenderness, seizures, sinus pressure, sore throat, swollen glands, tingling, tinnitus, visual change, vomiting, weakness or weight loss. Associated symptoms comments: He had an aura of "fluttering in both eyes" prior to the headache.. Nothing aggravates the symptoms. He has tried acetaminophen for the symptoms. The treatment provided mild relief. His past medical history is significant for hypertension, migraine headaches and migraines in the family. There is no history of immunosuppression, obesity, pseudotumor cerebri, recent head traumas, sinus disease or TMJ.      Review of Systems  Constitutional: Negative.  Negative for fever, chills, weight loss, diaphoresis, activity change, appetite change, fatigue and unexpected weight change.  HENT: Negative for ear pain, hearing loss, rhinorrhea, sinus pressure, sore throat and tinnitus.   Eyes: Negative.  Negative for blurred vision, photophobia, pain and redness.  Respiratory: Negative.  Negative for cough, choking, chest tightness, shortness of breath and stridor.   Cardiovascular: Negative.  Negative for chest pain, palpitations and leg  swelling.  Gastrointestinal: Negative.  Negative for nausea, vomiting, abdominal pain, diarrhea, constipation, blood in stool and anorexia.  Endocrine: Negative.   Genitourinary: Negative.   Musculoskeletal: Negative.  Negative for arthralgias, back pain, gait problem, joint swelling, myalgias, neck pain and neck stiffness.  Skin: Negative.  Negative for rash.  Allergic/Immunologic: Negative.   Neurological: Positive for headaches. Negative for dizziness, tingling, tremors, seizures, syncope, facial asymmetry, speech difficulty, weakness, light-headedness, numbness and loss of balance.  Hematological: Negative.  Negative for adenopathy. Does not bruise/bleed easily.  Psychiatric/Behavioral: Negative.  The patient does not have insomnia.        Objective:   Physical Exam  Vitals reviewed. Constitutional: He is oriented to person, place, and time. He appears well-developed and well-nourished.  Non-toxic appearance. He does not have a sickly appearance. He does not appear ill. No distress.  HENT:  Head: Normocephalic and atraumatic.  Mouth/Throat: Oropharynx is clear and moist. No oropharyngeal exudate.  Eyes: Conjunctivae and EOM are normal. Pupils are equal, round, and reactive to light. Right eye exhibits no discharge. Left eye exhibits no discharge. No scleral icterus. Right eye exhibits normal extraocular motion and no nystagmus. Left eye exhibits normal extraocular motion and no nystagmus. Right pupil is round and reactive. Left pupil is round and reactive. Pupils are equal.  Neck: Normal range of motion. Neck supple. No JVD present. No tracheal deviation present. No thyromegaly present.  Cardiovascular: Normal rate, regular rhythm, normal heart sounds and intact distal pulses.  Exam reveals no gallop and no friction rub.   No murmur heard. Pulmonary/Chest: Effort normal and breath sounds normal. No stridor. No respiratory distress. He has no wheezes. He has no rales. He exhibits no  tenderness.  Abdominal: Soft. Bowel sounds are normal. He  exhibits no distension and no mass. There is no tenderness. There is no rebound and no guarding.  Musculoskeletal: Normal range of motion. He exhibits no edema and no tenderness.  Lymphadenopathy:    He has no cervical adenopathy.  Neurological: He is alert and oriented to person, place, and time. He has normal strength. He displays no atrophy, no tremor and normal reflexes. No cranial nerve deficit or sensory deficit. He exhibits normal muscle tone. He displays a negative Romberg sign. He displays no seizure activity. Coordination and gait normal. He displays no Babinski's sign on the right side. He displays no Babinski's sign on the left side.  Reflex Scores:      Tricep reflexes are 1+ on the right side and 1+ on the left side.      Bicep reflexes are 1+ on the right side and 1+ on the left side.      Brachioradialis reflexes are 1+ on the right side and 1+ on the left side.      Patellar reflexes are 1+ on the right side and 1+ on the left side.      Achilles reflexes are 1+ on the right side and 1+ on the left side. Skin: Skin is warm and dry. No rash noted. He is not diaphoretic. No erythema. No pallor.  Psychiatric: He has a normal mood and affect. His behavior is normal. Judgment and thought content normal.     Lab Results  Component Value Date   WBC 3.2* 09/28/2012   HGB 13.4 09/28/2012   HCT 38.7* 09/28/2012   PLT 162.0 09/28/2012   GLUCOSE 90 09/28/2012   CHOL 166 09/28/2012   TRIG 50.0 09/28/2012   HDL 66.20 09/28/2012   LDLCALC 90 09/28/2012   ALT 37 09/28/2012   AST 25 09/28/2012   NA 130* 09/28/2012   K 4.4 09/28/2012   CL 100 09/28/2012   CREATININE 1.0 09/28/2012   BUN 18 09/28/2012   CO2 25 09/28/2012   TSH 1.75 09/28/2012   PSA 0.99 09/28/2012       Assessment & Plan:

## 2014-04-25 NOTE — Patient Instructions (Signed)

## 2014-04-26 NOTE — Assessment & Plan Note (Signed)
His CBC and ESR are normal so I am not concerned about vasculitis, TA, infection, etc This is a classic migraine with aura - will try treximet to treat this

## 2014-04-26 NOTE — Assessment & Plan Note (Signed)
His BP elevation is borderline He does not want to start a medication but he agrees to work on his lifestyle modifications

## 2014-05-04 ENCOUNTER — Other Ambulatory Visit: Payer: Self-pay | Admitting: Internal Medicine

## 2014-06-18 ENCOUNTER — Encounter: Payer: Self-pay | Admitting: Internal Medicine

## 2014-06-18 ENCOUNTER — Other Ambulatory Visit (INDEPENDENT_AMBULATORY_CARE_PROVIDER_SITE_OTHER): Payer: 59

## 2014-06-18 ENCOUNTER — Ambulatory Visit (INDEPENDENT_AMBULATORY_CARE_PROVIDER_SITE_OTHER): Payer: 59 | Admitting: Internal Medicine

## 2014-06-18 VITALS — BP 148/80 | HR 70 | Temp 98.4°F | Ht 68.5 in | Wt 170.0 lb

## 2014-06-18 DIAGNOSIS — Z0000 Encounter for general adult medical examination without abnormal findings: Secondary | ICD-10-CM

## 2014-06-18 DIAGNOSIS — Z9889 Other specified postprocedural states: Secondary | ICD-10-CM

## 2014-06-18 DIAGNOSIS — IMO0001 Reserved for inherently not codable concepts without codable children: Secondary | ICD-10-CM | POA: Insufficient documentation

## 2014-06-18 DIAGNOSIS — Z9049 Acquired absence of other specified parts of digestive tract: Secondary | ICD-10-CM

## 2014-06-18 DIAGNOSIS — R030 Elevated blood-pressure reading, without diagnosis of hypertension: Secondary | ICD-10-CM

## 2014-06-18 DIAGNOSIS — N200 Calculus of kidney: Secondary | ICD-10-CM

## 2014-06-18 DIAGNOSIS — R9431 Abnormal electrocardiogram [ECG] [EKG]: Secondary | ICD-10-CM

## 2014-06-18 DIAGNOSIS — Z23 Encounter for immunization: Secondary | ICD-10-CM

## 2014-06-18 LAB — CBC WITH DIFFERENTIAL/PLATELET
Basophils Absolute: 0 10*3/uL (ref 0.0–0.1)
Basophils Relative: 0.8 % (ref 0.0–3.0)
EOS ABS: 0.2 10*3/uL (ref 0.0–0.7)
Eosinophils Relative: 3.1 % (ref 0.0–5.0)
HEMATOCRIT: 41.6 % (ref 39.0–52.0)
Hemoglobin: 14.4 g/dL (ref 13.0–17.0)
Lymphocytes Relative: 16.6 % (ref 12.0–46.0)
Lymphs Abs: 0.8 10*3/uL (ref 0.7–4.0)
MCHC: 34.7 g/dL (ref 30.0–36.0)
MCV: 99.9 fl (ref 78.0–100.0)
MONO ABS: 0.4 10*3/uL (ref 0.1–1.0)
Monocytes Relative: 9.1 % (ref 3.0–12.0)
NEUTROS PCT: 70.4 % (ref 43.0–77.0)
Neutro Abs: 3.4 10*3/uL (ref 1.4–7.7)
Platelets: 193 10*3/uL (ref 150.0–400.0)
RBC: 4.16 Mil/uL — AB (ref 4.22–5.81)
RDW: 12.6 % (ref 11.5–15.5)
WBC: 4.8 10*3/uL (ref 4.0–10.5)

## 2014-06-18 LAB — LIPID PANEL
CHOL/HDL RATIO: 3
Cholesterol: 199 mg/dL (ref 0–200)
HDL: 77.5 mg/dL (ref >39.00)
LDL CALC: 98 mg/dL (ref 0–99)
NONHDL: 121.5
TRIGLYCERIDES: 117 mg/dL (ref 0.0–149.0)
VLDL: 23.4 mg/dL (ref 0.0–40.0)

## 2014-06-18 LAB — BASIC METABOLIC PANEL
BUN: 17 mg/dL (ref 6–23)
CO2: 27 meq/L (ref 19–32)
CREATININE: 1.4 mg/dL (ref 0.4–1.5)
Calcium: 9.2 mg/dL (ref 8.4–10.5)
Chloride: 99 mEq/L (ref 96–112)
GFR: 53.97 mL/min — ABNORMAL LOW (ref >60.00)
Glucose, Bld: 107 mg/dL — ABNORMAL HIGH (ref 70–99)
POTASSIUM: 4 meq/L (ref 3.5–5.1)
Sodium: 132 mEq/L — ABNORMAL LOW (ref 135–145)

## 2014-06-18 LAB — HEPATIC FUNCTION PANEL
ALK PHOS: 32 U/L — AB (ref 39–117)
ALT: 33 U/L (ref 0–53)
AST: 25 U/L (ref 0–37)
Albumin: 4.4 g/dL (ref 3.5–5.2)
BILIRUBIN TOTAL: 0.8 mg/dL (ref 0.2–1.2)
Bilirubin, Direct: 0.1 mg/dL (ref 0.0–0.3)
Total Protein: 7.3 g/dL (ref 6.0–8.3)

## 2014-06-18 MED ORDER — ASPIRIN-ACETAMINOPHEN-CAFFEINE 250-250-65 MG PO TABS: 2.0000 | ORAL_TABLET | Freq: Three times a day (TID) | ORAL | Status: DC | PRN

## 2014-06-18 NOTE — Assessment & Plan Note (Signed)
Dr Elnoria Howard

## 2014-06-18 NOTE — Progress Notes (Signed)
   Subjective:    HPI  The patient is here for a wellness exam  C/o stress C/o HAs w/aura  Review of Systems  Constitutional: Negative for fever and fatigue.  HENT: Positive for congestion and ear pain. Negative for dental problem, hearing loss, mouth sores, sore throat and voice change.   Eyes: Negative for pain.       Objective:   Physical Exam  Constitutional: He is oriented to person, place, and time. He appears well-developed. No distress.  NAD  HENT:  Mouth/Throat: Oropharynx is clear and moist. No oropharyngeal exudate.  Fluid behind L TM Hair clipping in the L ear canal next to TM  Eyes: Conjunctivae are normal. Pupils are equal, round, and reactive to light.  Neck: Normal range of motion. No JVD present. No thyromegaly present.  Cardiovascular: Normal rate, regular rhythm, normal heart sounds and intact distal pulses.  Exam reveals no gallop and no friction rub.   No murmur heard. Pulmonary/Chest: Effort normal and breath sounds normal. No respiratory distress. He has no wheezes. He has no rales. He exhibits no tenderness.  Abdominal: Soft. Bowel sounds are normal. He exhibits no distension and no mass. There is no tenderness. There is no rebound and no guarding.  Musculoskeletal: Normal range of motion. He exhibits no edema and no tenderness.  Lymphadenopathy:    He has no cervical adenopathy.  Neurological: He is alert and oriented to person, place, and time. He has normal reflexes. No cranial nerve deficit. He exhibits normal muscle tone. He displays a negative Romberg sign. Coordination and gait normal.  No meningeal signs  Skin: Skin is warm and dry. No rash noted.  Psychiatric: He has a normal mood and affect. His behavior is normal. Judgment and thought content normal.   Lab Results  Component Value Date   WBC 4.5 04/25/2014   HGB 13.7 04/25/2014   HCT 38.8* 04/25/2014   PLT 190.0 04/25/2014   GLUCOSE 92 04/25/2014   CHOL 166 09/28/2012   TRIG 50.0 09/28/2012     HDL 66.20 09/28/2012   LDLCALC 90 09/28/2012   ALT 31 04/25/2014   AST 23 04/25/2014   NA 130* 04/25/2014   K 4.2 04/25/2014   CL 98 04/25/2014   CREATININE 1.0 04/25/2014   BUN 18 04/25/2014   CO2 25 04/25/2014   TSH 1.38 04/25/2014   PSA 0.99 09/28/2012          Assessment & Plan:

## 2014-06-18 NOTE — Assessment & Plan Note (Signed)
1992 ulcerative colitis Labs

## 2014-06-18 NOTE — Progress Notes (Signed)
Pre visit review using our clinic review tool, if applicable. No additional management support is needed unless otherwise documented below in the visit note. 

## 2014-06-18 NOTE — Assessment & Plan Note (Signed)
BP Readings from Last 3 Encounters:  06/18/14 148/80  04/25/14 142/88  03/21/14 140/90

## 2014-06-18 NOTE — Assessment & Plan Note (Signed)
10/15 IRBBB Twin brother with replaced valve ECHO

## 2014-06-19 LAB — VITAMIN B12: VITAMIN B 12: 436 pg/mL (ref 211–911)

## 2014-06-19 LAB — TSH: TSH: 1.31 u[IU]/mL (ref 0.35–4.50)

## 2014-06-19 LAB — VITAMIN D 25 HYDROXY (VIT D DEFICIENCY, FRACTURES): VITD: 36.03 ng/mL (ref 30.00–100.00)

## 2014-08-29 ENCOUNTER — Ambulatory Visit (INDEPENDENT_AMBULATORY_CARE_PROVIDER_SITE_OTHER): Payer: 59 | Admitting: Family

## 2014-08-29 ENCOUNTER — Encounter: Payer: Self-pay | Admitting: Family

## 2014-08-29 VITALS — BP 140/82 | HR 60 | Temp 98.3°F | Resp 18 | Ht 68.5 in | Wt 171.8 lb

## 2014-08-29 DIAGNOSIS — J019 Acute sinusitis, unspecified: Secondary | ICD-10-CM

## 2014-08-29 MED ORDER — AZITHROMYCIN 250 MG PO TABS: ORAL_TABLET | ORAL | Status: DC

## 2014-08-29 NOTE — Progress Notes (Signed)
   Subjective:    Patient ID: William [name], male    DOB: 14-Sep-1950, 63 y.o.   MRN: [id number]  Chief Complaint  Patient presents with  . sinus issues    productive cough, sinus pressure, congestion, since yesterday    HPI:  William [name] is a 63 y.o. male who presents today for an acute visit.   Acute symptoms of productive cough, sinus pressure and congestion have been going on for about 1 day. Denies any fevers. Has tried taken Mucinex, Sudafed and Ibuprofen which did not help very much. Indicates he has a very productive cough when he woke up this morning.   States has a history of sinus infections. Denies any recent antibiotic use.   No Known Allergies  Current Outpatient Prescriptions on File Prior to Visit  Medication Sig Dispense Refill  . aspirin-acetaminophen-caffeine (EXCEDRIN EXTRA STRENGTH) 250-250-65 MG per tablet Take 2 tablets by mouth every 8 (eight) hours as needed for headache or migraine. 30 tablet 0  . ferrous sulfate 325 (65 FE) MG tablet Take 325 mg by mouth daily with breakfast.      . Multiple Vitamin (MULTIVITAMIN) tablet Take 1 tablet by mouth daily.    [name] omeprazole (PRILOSEC) 40 MG capsule take 1 capsule by mouth twice a day 60 capsule 3  . potassium citrate (UROCIT-K 10) 10 MEQ (1080 MG) SR tablet Take 10 mEq by mouth 2 (two) times daily.     . psyllium (METAMUCIL) 58.6 % powder Take 1 packet by mouth 2 (two) times daily. 1 TBS Twice Daily.    . Sildenafil Citrate (VIAGRA PO) Take by mouth.     No current facility-administered medications on file prior to visit.    Review of Systems   See HPI    Objective:    BP 140/82 mmHg  Pulse 60  Temp(Src) 98.3 F (36.8 C) (Oral)  Resp 18  Ht 5' 8.5" (1.74 m)  Wt 171 lb 12.8 oz (77.928 kg)  BMI 25.74 kg/m2  SpO2 97% Nursing note and vital signs reviewed.  Physical Exam  Constitutional: He is oriented to person, place, and time. He appears well-developed and well-nourished. No distress.  HENT:    Right Ear: Hearing, tympanic membrane, external ear and ear canal normal.  Left Ear: Hearing, tympanic membrane, external ear and ear canal normal.  Nose: Right sinus exhibits maxillary sinus tenderness and frontal sinus tenderness. Left sinus exhibits maxillary sinus tenderness and frontal sinus tenderness.  Mouth/Throat: Oropharynx is clear and moist and mucous membranes are normal.  Cardiovascular: Normal rate, regular rhythm, normal heart sounds and intact distal pulses.   Pulmonary/Chest: Effort normal and breath sounds normal.  Neurological: He is alert and oriented to person, place, and time.  Skin: Skin is warm and dry.  Psychiatric: He has a normal mood and affect. His behavior is normal. Judgment and thought content normal.       Assessment & Plan:

## 2014-08-29 NOTE — Patient Instructions (Signed)
Thank you for choosing [facility].  Summary/Instructions:  Your prescription(s) have been submitted to your pharmacy. Please take as directed and contact our office if you believe you are having problem(s) with the medication(s).  If your symptoms worsen or fail to improve, please contact our office for further instruction, or in case of emergency go directly to the emergency room at the closest medical facility.   General Recommendations: Please drink plenty of fluids. Sleep in humidified air if possible. Use saline nasal sprays or the Netti pot as needed.   Medicaitons:  Flonase as needed to assist with congestion. You may use Mucinex to help break up congestion as you feel is needed. Tylenol as needed for fever. Sudafed for congestion.    Sinusitis Sinusitis is redness, soreness, and inflammation of the paranasal sinuses. Paranasal sinuses are air pockets within the bones of your face (beneath the eyes, the middle of the forehead, or above the eyes). In healthy paranasal sinuses, mucus is able to drain out, and air is able to circulate through them by way of your nose. However, when your paranasal sinuses are inflamed, mucus and air can become trapped. This can allow bacteria and other germs to grow and cause infection. Sinusitis can develop quickly and last only a short time (acute) or continue over a long period (chronic). Sinusitis that lasts for more than 12 weeks is considered chronic.  CAUSES  Causes of sinusitis include:  Allergies.  Structural abnormalities, such as displacement of the cartilage that separates your nostrils (deviated septum), which can decrease the air flow through your nose and sinuses and affect sinus drainage.  Functional abnormalities, such as when the small hairs (cilia) that line your sinuses and help remove mucus do not work properly or are not present. SIGNS AND SYMPTOMS  Symptoms of acute and chronic sinusitis are the same. The primary symptoms  are pain and pressure around the affected sinuses. Other symptoms include:  Upper toothache.  Earache.  Headache.  Bad breath.  Decreased sense of smell and taste.  A cough, which worsens when you are lying flat.  Fatigue.  Fever.  Thick drainage from your nose, which often is green and may contain pus (purulent).  Swelling and warmth over the affected sinuses. DIAGNOSIS  Your health care provider will perform a physical exam. During the exam, your health care provider may:  Look in your nose for signs of abnormal growths in your nostrils (nasal polyps).  Tap over the affected sinus to check for signs of infection.  View the inside of your sinuses (endoscopy) using an imaging device that has a light attached (endoscope). If your health care provider suspects that you have chronic sinusitis, one or more of the following tests may be recommended:  Allergy tests.  Nasal culture. A sample of mucus is taken from your nose, sent to a lab, and screened for bacteria.  Nasal cytology. A sample of mucus is taken from your nose and examined by your health care provider to determine if your sinusitis is related to an allergy. TREATMENT  Most cases of acute sinusitis are related to a viral infection and will resolve on their own within 10 days. Sometimes medicines are prescribed to help relieve symptoms (pain medicine, decongestants, nasal steroid sprays, or saline sprays).  However, for sinusitis related to a bacterial infection, your health care provider will prescribe antibiotic medicines. These are medicines that will help kill the bacteria causing the infection.  Rarely, sinusitis is caused by a fungal infection.  In theses cases, your health care provider will prescribe antifungal medicine. For some cases of chronic sinusitis, surgery is needed. Generally, these are cases in which sinusitis recurs more than 3 times per year, despite other treatments. HOME CARE INSTRUCTIONS   Drink  plenty of water. Water helps thin the mucus so your sinuses can drain more easily.  Use a humidifier.  Inhale steam 3 to 4 times a day (for example, sit in the bathroom with the shower running).  Apply a warm, moist washcloth to your face 3 to 4 times a day, or as directed by your health care provider.  Use saline nasal sprays to help moisten and clean your sinuses.  Take medicines only as directed by your health care provider.  If you were prescribed either an antibiotic or antifungal medicine, finish it all even if you start to feel better. SEEK IMMEDIATE MEDICAL CARE IF:  You have increasing pain or severe headaches.  You have nausea, vomiting, or drowsiness.  You have swelling around your face.  You have vision problems.  You have a stiff neck.  You have difficulty breathing. MAKE SURE YOU:   Understand these instructions.  Will watch your condition.  Will get help right away if you are not doing well or get worse. Document Released: 08/30/2005 Document Revised: 01/14/2014 Document Reviewed: 09/14/2011 [facility] Patient Information 2015 [place], [place]. This information is not intended to replace advice given to you by your health care provider. Make sure you discuss any questions you have with your health care provider.

## 2014-08-29 NOTE — Assessment & Plan Note (Signed)
Symptoms and exam consistent with sinusitis. Start azithromycin. Continue over-the-counter medications for symptom relief and supportive care. Sinus care information provided to patient. Follow up if symptoms worsen or fail to improve.

## 2014-08-29 NOTE — Progress Notes (Signed)
Pre visit review using our clinic review tool, if applicable. No additional management support is needed unless otherwise documented below in the visit note. 

## 2014-09-07 ENCOUNTER — Other Ambulatory Visit: Payer: Self-pay | Admitting: Internal Medicine

## 2014-09-18 ENCOUNTER — Encounter: Payer: Self-pay | Admitting: Internal Medicine

## 2014-09-18 ENCOUNTER — Ambulatory Visit (INDEPENDENT_AMBULATORY_CARE_PROVIDER_SITE_OTHER): Payer: 59 | Admitting: Internal Medicine

## 2014-09-18 VITALS — BP 150/90 | HR 64 | Temp 97.9°F | Wt 176.0 lb

## 2014-09-18 DIAGNOSIS — IMO0001 Reserved for inherently not codable concepts without codable children: Secondary | ICD-10-CM

## 2014-09-18 DIAGNOSIS — R030 Elevated blood-pressure reading, without diagnosis of hypertension: Secondary | ICD-10-CM

## 2014-09-18 MED ORDER — LOSARTAN POTASSIUM 100 MG PO TABS: 100.0000 mg | ORAL_TABLET | Freq: Every day | ORAL | Status: DC

## 2014-09-18 NOTE — Assessment & Plan Note (Signed)
Will watch  Start Losartan if BP>145/90

## 2014-09-18 NOTE — Progress Notes (Signed)
   Subjective:    HPI   F/u elevated BP  BP Readings from Last 3 Encounters:  09/18/14 150/90  08/29/14 140/82  06/18/14 148/80  Re-checked BP 145/90    Review of Systems  Constitutional: Negative for fever and fatigue.  HENT: Positive for congestion and ear pain. Negative for dental problem, hearing loss, mouth sores, sore throat and voice change.   Eyes: Negative for pain.       Objective:   Physical Exam  Constitutional: He is oriented to person, place, and time. He appears well-developed. No distress.  NAD  HENT:  Mouth/Throat: Oropharynx is clear and moist.  Eyes: Conjunctivae are normal. Pupils are equal, round, and reactive to light.  Neck: Normal range of motion. No JVD present. No thyromegaly present.  Cardiovascular: Normal rate, regular rhythm, normal heart sounds and intact distal pulses.  Exam reveals no gallop and no friction rub.   No murmur heard. Pulmonary/Chest: Effort normal and breath sounds normal. No respiratory distress. He has no wheezes. He has no rales. He exhibits no tenderness.  Abdominal: Soft. Bowel sounds are normal. He exhibits no distension and no mass. There is no tenderness. There is no rebound and no guarding.  Musculoskeletal: Normal range of motion. He exhibits no edema or tenderness.  Lymphadenopathy:    He has no cervical adenopathy.  Neurological: He is alert and oriented to person, place, and time. He has normal reflexes. No cranial nerve deficit. He exhibits normal muscle tone. He displays a negative Romberg sign. Coordination and gait normal.  No meningeal signs  Skin: Skin is warm and dry. No rash noted.  Psychiatric: He has a normal mood and affect. His behavior is normal. Judgment and thought content normal.   Lab Results  Component Value Date   WBC 4.8 06/18/2014   HGB 14.4 06/18/2014   HCT 41.6 06/18/2014   PLT 193.0 06/18/2014   GLUCOSE 107* 06/18/2014   CHOL 199 06/18/2014   TRIG 117.0 06/18/2014   HDL 77.50  06/18/2014   LDLCALC 98 06/18/2014   ALT 33 06/18/2014   AST 25 06/18/2014   NA 132* 06/18/2014   K 4.0 06/18/2014   CL 99 06/18/2014   CREATININE 1.4 06/18/2014   BUN 17 06/18/2014   CO2 27 06/18/2014   TSH 1.31 06/18/2014   PSA 0.99 09/28/2012          Assessment & Plan:

## 2014-09-18 NOTE — Patient Instructions (Signed)
Start Losartan if BP>145/90

## 2014-09-18 NOTE — Progress Notes (Signed)
Pre visit review using our clinic review tool, if applicable. No additional management support is needed unless otherwise documented below in the visit note. 

## 2014-09-22 ENCOUNTER — Encounter: Payer: Self-pay | Admitting: Internal Medicine

## 2014-10-12 ENCOUNTER — Other Ambulatory Visit: Payer: Self-pay | Admitting: Internal Medicine

## 2014-10-14 ENCOUNTER — Telehealth: Payer: Self-pay | Admitting: Internal Medicine

## 2014-10-15 MED ORDER — OMEPRAZOLE 40 MG PO CPDR: 40.0000 mg | DELAYED_RELEASE_CAPSULE | Freq: Two times a day (BID) | ORAL | Status: DC

## 2014-10-15 NOTE — Telephone Encounter (Signed)
Refilled Omeprazole and lm message for patient that I had sent the rx

## 2014-10-23 ENCOUNTER — Encounter: Payer: Self-pay | Admitting: Internal Medicine

## 2014-10-23 ENCOUNTER — Ambulatory Visit (INDEPENDENT_AMBULATORY_CARE_PROVIDER_SITE_OTHER): Payer: 59 | Admitting: Internal Medicine

## 2014-10-23 VITALS — BP 170/92 | HR 70 | Temp 98.6°F | Wt 173.0 lb

## 2014-10-23 DIAGNOSIS — R030 Elevated blood-pressure reading, without diagnosis of hypertension: Secondary | ICD-10-CM

## 2014-10-23 DIAGNOSIS — Z566 Other physical and mental strain related to work: Secondary | ICD-10-CM

## 2014-10-23 DIAGNOSIS — IMO0001 Reserved for inherently not codable concepts without codable children: Secondary | ICD-10-CM

## 2014-10-23 DIAGNOSIS — R0789 Other chest pain: Secondary | ICD-10-CM

## 2014-10-23 DIAGNOSIS — R079 Chest pain, unspecified: Secondary | ICD-10-CM

## 2014-10-23 MED ORDER — RIZATRIPTAN BENZOATE 10 MG PO TBDP: 10.0000 mg | ORAL_TABLET | ORAL | Status: DC | PRN

## 2014-10-23 MED ORDER — ALPRAZOLAM 0.25 MG PO TABS: 0.2500 mg | ORAL_TABLET | Freq: Two times a day (BID) | ORAL | Status: DC | PRN

## 2014-10-23 MED ORDER — ASPIRIN 81 MG PO CHEW
81.0000 mg | CHEWABLE_TABLET | Freq: Every day | ORAL | Status: DC
Start: 2014-10-23 — End: 2018-04-12

## 2014-10-23 MED ORDER — BUTALBITAL-ACETAMINOPHEN 50-300 MG PO TABS: 1.0000 | ORAL_TABLET | Freq: Two times a day (BID) | ORAL | Status: DC | PRN

## 2014-10-23 NOTE — Progress Notes (Signed)
Subjective:    Migraine  This is a recurrent problem. The current episode started more than 1 year ago. The problem occurs intermittently. The problem has been unchanged. The pain is located in the bilateral region. The pain does not radiate. The pain quality is similar to prior headaches. The quality of the pain is described as pulsating. The pain is moderate. Associated symptoms include ear pain. Pertinent negatives include no abdominal pain, blurred vision, eye pain, fever, hearing loss or sore throat. The symptoms are aggravated by activity. He has tried Excedrin, NSAIDs and triptans for the symptoms. The treatment provided mild relief. His past medical history is significant for hypertension, migraine headaches and migraines in the family.  Hypertension This is a new problem. The current episode started more than 1 month ago. The problem is unchanged. Pertinent negatives include no blurred vision. There are no known risk factors for coronary artery disease. Past treatments include nothing.  Treximet did not work. Brother takes Maxalt C/o chest tightness today - stressed...  F/u elevated BP, SBP 140-150 at home Pt started Losartan this am - took 1/2 tab  BP Readings from Last 3 Encounters:  10/23/14 170/92  09/18/14 150/90  08/29/14 140/82      Review of Systems  Constitutional: Negative for fever and fatigue.  HENT: Positive for congestion and ear pain. Negative for dental problem, hearing loss, mouth sores, sore throat and voice change.   Eyes: Negative for blurred vision and pain.  Gastrointestinal: Negative for abdominal pain.       Objective:   Physical Exam  Constitutional: He is oriented to person, place, and time. He appears well-developed. No distress.  NAD  HENT:  Mouth/Throat: Oropharynx is clear and moist.  Eyes: Conjunctivae are normal. Pupils are equal, round, and reactive to light.  Neck: Normal range of motion. No JVD present. No thyromegaly present.    Cardiovascular: Normal rate, regular rhythm, normal heart sounds and intact distal pulses.  Exam reveals no gallop and no friction rub.   No murmur heard. Pulmonary/Chest: Effort normal and breath sounds normal. No respiratory distress. He has no wheezes. He has no rales. He exhibits no tenderness.  Abdominal: Soft. Bowel sounds are normal. He exhibits no distension and no mass. There is no tenderness. There is no rebound and no guarding.  Musculoskeletal: Normal range of motion. He exhibits no edema or tenderness.  Lymphadenopathy:    He has no cervical adenopathy.  Neurological: He is alert and oriented to person, place, and time. He has normal reflexes. No cranial nerve deficit. He exhibits normal muscle tone. He displays a negative Romberg sign. Coordination and gait normal.  No meningeal signs  Skin: Skin is warm and dry. No rash noted.  Psychiatric: He has a normal mood and affect. His behavior is normal. Judgment and thought content normal.   Lab Results  Component Value Date   WBC 4.8 06/18/2014   HGB 14.4 06/18/2014   HCT 41.6 06/18/2014   PLT 193.0 06/18/2014   GLUCOSE 107* 06/18/2014   CHOL 199 06/18/2014   TRIG 117.0 06/18/2014   HDL 77.50 06/18/2014   LDLCALC 98 06/18/2014   ALT 33 06/18/2014   AST 25 06/18/2014   NA 132* 06/18/2014   K 4.0 06/18/2014   CL 99 06/18/2014   CREATININE 1.4 06/18/2014   BUN 17 06/18/2014   CO2 27 06/18/2014   TSH 1.31 06/18/2014   PSA 0.99 09/28/2012    EKG      Assessment &  Plan:

## 2014-10-23 NOTE — Progress Notes (Signed)
Pre visit review using our clinic review tool, if applicable. No additional management support is needed unless otherwise documented below in the visit note. 

## 2014-10-24 DIAGNOSIS — Z566 Other physical and mental strain related to work: Secondary | ICD-10-CM | POA: Insufficient documentation

## 2014-10-24 NOTE — Assessment & Plan Note (Signed)
Started Losartan today

## 2014-10-24 NOTE — Assessment & Plan Note (Signed)
Discussed ASA 81 mg a day EKG - ok Pt declined a stress test Manage stress

## 2014-10-24 NOTE — Assessment & Plan Note (Signed)
Xanax prn  Potential benefits of a short term prn benzodiazepines  use as well as potential risks  and complications were explained to the patient and were aknowledged.

## 2014-11-12 ENCOUNTER — Ambulatory Visit (INDEPENDENT_AMBULATORY_CARE_PROVIDER_SITE_OTHER): Payer: 59 | Admitting: Internal Medicine

## 2014-11-12 ENCOUNTER — Encounter: Payer: Self-pay | Admitting: Internal Medicine

## 2014-11-12 VITALS — BP 138/80 | HR 72 | Ht 68.5 in | Wt 176.4 lb

## 2014-11-12 DIAGNOSIS — K2100 Gastro-esophageal reflux disease with esophagitis, without bleeding: Secondary | ICD-10-CM

## 2014-11-12 DIAGNOSIS — K5669 Other intestinal obstruction: Secondary | ICD-10-CM

## 2014-11-12 DIAGNOSIS — K56699 Other intestinal obstruction unspecified as to partial versus complete obstruction: Secondary | ICD-10-CM

## 2014-11-12 DIAGNOSIS — K624 Stenosis of anus and rectum: Secondary | ICD-10-CM

## 2014-11-12 DIAGNOSIS — D509 Iron deficiency anemia, unspecified: Secondary | ICD-10-CM

## 2014-11-12 DIAGNOSIS — K222 Esophageal obstruction: Secondary | ICD-10-CM

## 2014-11-12 NOTE — Patient Instructions (Signed)
You have been scheduled for a flexible sigmoidoscopy on 11/13/2014.  Purchase one bottle of Magnesium Citrate over the counter.

## 2014-11-12 NOTE — Progress Notes (Signed)
HISTORY OF PRESENT ILLNESS:  William [name] is a 64 y.o. male with a history of erosive esophagitis complicated by peptic stricture and iron deficiency anemia, and a remote history of ulcerative colitis for which he is status post total abdominal colectomy with ileoanal anastomosis greater than 20 years ago. He also has a history of ileoanal anastomotic stricturing with partial bowel obstruction requiring endoscopic pneumatic dilation. He presents today for routine follow-up regarding his GERD and anemia. As well, new problems with recurrent symptoms suggestive of recurrent anastomotic stricturing. For his GERD, he is maintained on omeprazole 40 mg twice daily. For his iron deficiency, he takes iron sulfate once daily. His last hemoglobin in October 2015 was 14.4. Normal liver tests. He last underwent sigmoidoscopy with balloon dilation (12, 13.5, 15 mm) 10/03/2013. Currently with difficulties with bowel movements for several weeks. He has resumed MiraLAX in addition to fiber. No severe abdominal pain or vomiting. He requests repeat stricture dilation ASAP. He denies active reflux symptoms, dysphagia, or other GI complaints  REVIEW OF SYSTEMS:  All non-GI ROS negative except for anxiety  Past Medical History  Diagnosis Date  . Allergic rhinitis   . Anemia, iron deficiency   . Ulcerative colitis     status post total abdominal colectomy with ileoanal anastomosis  . Gastric polyps   . ED (erectile dysfunction)   . GERD (gastroesophageal reflux disease)   . Esophageal stricture     Status post upper endoscopy with esophageal dilatation  . Kidney stone   . Kidney stone   . Inguinal hernia   . Hiatal hernia     Past Surgical History  Procedure Laterality Date  . Inguinal hernia repair    . Total abdominal colectomy      with ilial anal pull through (for ulcerative colitis)  . Ethmoidectomy      and maxillary enterostomies '04, redo in 12/04  . Knee arthroscopy  2004  . Kidney stones     removed 2013    Social History William [name]  reports that he has quit smoking. He has never used smokeless tobacco. He reports that he does not drink alcohol or use illicit drugs.  family history includes Cancer in his father; Dementia in his mother; Heart disease in his brother; Hypertension in his mother.  No Known Allergies     PHYSICAL EXAMINATION: Vital signs: BP 138/80 mmHg  Pulse 72  Ht 5' 8.5" (1.74 m)  Wt 176 lb 6.4 oz (80.015 kg)  BMI 26.43 kg/m2  Constitutional: generally well-appearing, no acute distress Psychiatric: alert and oriented x3, cooperative Eyes: extraocular movements intact, anicteric, conjunctiva pink Mouth: oral pharynx moist, no lesions Neck: supple no lymphadenopathy Cardiovascular: heart regular rate and rhythm, no murmur Lungs: clear to auscultation bilaterally Abdomen: soft, nontender, nondistended, no obvious ascites, no peritoneal signs, normal bowel sounds, no organomegaly. Previous surgical incision well-healed Rectal: Deferred until procedure Extremities: no lower extremity edema bilaterally Skin: no lesions on visible extremities Neuro: No focal deficits.   ASSESSMENT:  #1. History of ulcerative colitis status post total abdominal colectomy with ileoanal pull-through remotely #2. Ileoanal anastomotic stricture requiring previous dilation. Recent recurrence of symptoms (difficulty with bowel movements) presumably due to recurrent stricturing #3. GERD with history of erosive esophagitis and peptic stricture. Currently asymptomatic on twice a day PPI #4. Iron deficiency anemia secondary to erosive esophagitis. Normal hemoglobin on regular iron replacement therapy   PLAN:  #1. Flexible sigmoidoscopy with anastomotic stricture dilation.The nature of the procedure, as well as  the risks, benefits, and alternatives were carefully and thoroughly reviewed with the patient. Ample time for discussion and questions allowed. The patient understood,  was satisfied, and agreed to proceed. #2. Continue reflux precautions and PPI. Refilled as needed #3. Continue iron supplementation. Refilled as needed #4. Routine GI follow-up one year. Sooner if clinically indicated

## 2014-11-13 ENCOUNTER — Ambulatory Visit (AMBULATORY_SURGERY_CENTER): Payer: 59 | Admitting: Internal Medicine

## 2014-11-13 ENCOUNTER — Other Ambulatory Visit: Payer: 59 | Admitting: Internal Medicine

## 2014-11-13 ENCOUNTER — Encounter: Payer: Self-pay | Admitting: Internal Medicine

## 2014-11-13 VITALS — BP 146/78 | HR 59 | Temp 97.8°F | Resp 20 | Ht 68.5 in | Wt 176.0 lb

## 2014-11-13 DIAGNOSIS — K624 Stenosis of anus and rectum: Secondary | ICD-10-CM

## 2014-11-13 DIAGNOSIS — K5669 Other intestinal obstruction: Secondary | ICD-10-CM

## 2014-11-13 DIAGNOSIS — K56699 Other intestinal obstruction unspecified as to partial versus complete obstruction: Secondary | ICD-10-CM

## 2014-11-13 MED ORDER — SODIUM CHLORIDE 0.9 % IV SOLN: 500.0000 mL | INTRAVENOUS | Status: DC

## 2014-11-13 NOTE — Progress Notes (Signed)
Called to room to assist during endoscopic procedure.  Patient ID and intended procedure confirmed with present staff. Received instructions for my participation in the procedure from the performing physician.  

## 2014-11-13 NOTE — Progress Notes (Signed)
Report to PACU, RN, vss, BBS= Clear.  

## 2014-11-13 NOTE — Patient Instructions (Signed)
YOU HAD AN ENDOSCOPIC PROCEDURE TODAY AT [facility]:   Refer to the procedure report that was given to you for any specific questions about what was found during the examination.  If the procedure report does not answer your questions, please call your gastroenterologist to clarify.  If you requested that your care partner not be given the details of your procedure findings, then the procedure report has been included in a sealed envelope for you to review at your convenience later.  YOU SHOULD EXPECT: Some feelings of bloating in the abdomen. Passage of more gas than usual.  Walking can help get rid of the air that was put into your GI tract during the procedure and reduce the bloating. If you had a lower endoscopy (such as a colonoscopy or flexible sigmoidoscopy) you may notice spotting of blood in your stool or on the toilet paper. If you underwent a bowel prep for your procedure, you may not have a normal bowel movement for a few days.  Please Note:  You might notice some irritation and congestion in your nose or some drainage.  This is from the oxygen used during your procedure.  There is no need for concern and it should clear up in a day or so.  SYMPTOMS TO REPORT IMMEDIATELY:   Following lower endoscopy (colonoscopy or flexible sigmoidoscopy):  Excessive amounts of blood in the stool  Significant tenderness or worsening of abdominal pains  Swelling of the abdomen that is new, acute  Fever of 100F or higher   A gastroenterologist can be reached at any hour by calling [phone].   DIET: Your first meal following the procedure should be a small meal and then it is ok to progress to your normal diet. Heavy or fried foods are harder to digest and may make you feel nauseous or bloated.  Likewise, meals heavy in dairy and vegetables can increase bloating.  Drink plenty of fluids but you should avoid alcoholic beverages for 24 hours.  ACTIVITY:  You should plan to take it  easy for the rest of today and you should NOT DRIVE or use heavy machinery until tomorrow (because of the sedation medicines used during the test).    FOLLOW UP: Our staff will call the number listed on your records the next business day following your procedure to check on you and address any questions or concerns that you may have regarding the information given to you following your procedure. If we do not reach you, we will leave a message.  However, if you are feeling well and you are not experiencing any problems, there is no need to return our call.  We will assume that you have returned to your regular daily activities without incident.  If any biopsies were taken you will be contacted by phone or by letter within the next 1-3 weeks.  Please call us at [phone] if you have not heard about the biopsies in 3 weeks.    SIGNATURES/CONFIDENTIALITY: You and/or your care partner have signed paperwork which will be entered into your electronic medical record.  These signatures attest to the fact that that the information above on your After Visit Summary has been reviewed and is understood.  Full responsibility of the confidentiality of this discharge information lies with you and/or your care-partner.  Recommendations Discharge instructions given to patient and/or care partner. Follow up in 1 year. High fiber diet handout provided.

## 2014-11-13 NOTE — Op Note (Signed)
[place]  [address] [place], [id number]   FLEXIBLE SIGMOIDOSCOPY PROCEDURE REPORT  PATIENT: [name], William  MR#: [id number] BIRTHDATE: 1951-02-10 , 63  yrs. old GENDER: male ENDOSCOPIST: Eustace Quail, MD REFERRED BY: .  Self / Office PROCEDURE DATE:  11/13/2014 PROCEDURE:   Sigmoidoscopy with balloon dilation  - 47m ASA CLASS:   Class II INDICATIONS:balloon dilation of stenotic lesions. MEDICATIONS: Monitored anesthesia care and Propofol 200 mg IV  DESCRIPTION OF PROCEDURE:   After the risks benefits and alternatives of the procedure were thoroughly explained, informed consent was obtained.  Digital exam revealed no abnormalities of the rectum. The LB [id number] endoscope was introduced through the anus  and advanced to the surgical anastomosis , The exam was Without limitations.    The quality of the prep was The overall prep quality was good. .  The instrument was then slowly withdrawn as the mucosa was fully examined.         COLON FINDINGS: An 8 mm friable ileoanal anastomotic stricture would not permit passage of the standard upper endoscope proximal. THERAPY:The stricture was then balloon dilated to a maximal diameter of 15 mm.  Post dilation, the endoscope was easily advanced into normal-appearing small bowel.    Retroflexion was not performed.    The scope was then withdrawn from the patient and the procedure terminated.  COMPLICATIONS: There were no immediate complications.  ENDOSCOPIC IMPRESSION: 1. Ileoanal anastomotic stricture status post balloon dilation to 15 mm  RECOMMENDATIONS: 1. Fiber rich diet 2. Contact Dr. PHenrene Pastorif symptoms return. Otherwise routine follow-up in the office in 1 year  REPEAT EXAM:  eSigned:  JEustace Quail MD 11/13/2014 2:23 PM   CGK:KDPTV.  Plotnikov, MD The Patient

## 2014-11-14 ENCOUNTER — Telehealth: Payer: Self-pay | Admitting: *Deleted

## 2014-11-14 NOTE — Telephone Encounter (Signed)
Left message that we called for f/u

## 2014-12-04 ENCOUNTER — Ambulatory Visit (INDEPENDENT_AMBULATORY_CARE_PROVIDER_SITE_OTHER): Payer: 59 | Admitting: Internal Medicine

## 2014-12-04 ENCOUNTER — Encounter: Payer: Self-pay | Admitting: Internal Medicine

## 2014-12-04 VITALS — BP 130/82 | HR 66 | Wt 175.0 lb

## 2014-12-04 DIAGNOSIS — I10 Essential (primary) hypertension: Secondary | ICD-10-CM | POA: Diagnosis not present

## 2014-12-04 MED ORDER — LOSARTAN POTASSIUM-HCTZ 100-12.5 MG PO TABS: 1.0000 | ORAL_TABLET | Freq: Every day | ORAL | Status: DC

## 2014-12-04 NOTE — Progress Notes (Signed)
Pre visit review using our clinic review tool, if applicable. No additional management support is needed unless otherwise documented below in the visit note. 

## 2014-12-04 NOTE — Assessment & Plan Note (Signed)
Better Change to Hyzaar if needed

## 2014-12-04 NOTE — Progress Notes (Signed)
   Subjective:    Migraine  This is a recurrent problem. The current episode started more than 1 year ago. The problem occurs intermittently. The problem has been gradually improving. The pain is located in the bilateral region. The pain does not radiate. The pain quality is similar to prior headaches. The quality of the pain is described as pulsating. The pain is mild. Associated symptoms include ear pain. Pertinent negatives include no abdominal pain, blurred vision, eye pain, fever, hearing loss or sore throat. The symptoms are aggravated by activity. He has tried Excedrin, NSAIDs and triptans for the symptoms. The treatment provided mild relief. His past medical history is significant for hypertension, migraine headaches and migraines in the family.  Hypertension This is a new problem. The current episode started more than 1 month ago. The problem is unchanged. Pertinent negatives include no blurred vision. There are no known risk factors for coronary artery disease. Past treatments include nothing.  Treximet did not work. Brother takes Maxalt C/o chest tightness today - stressed...  F/u elevated BP, SBP 140-150 at home Pt started Losartan. BP 130/82  BP Readings from Last 3 Encounters:  12/04/14 160/108  11/13/14 146/78  11/12/14 138/80      Review of Systems  Constitutional: Negative for fever and fatigue.  HENT: Positive for congestion and ear pain. Negative for dental problem, hearing loss, mouth sores, sore throat and voice change.   Eyes: Negative for blurred vision and pain.  Gastrointestinal: Negative for abdominal pain.       Objective:   Physical Exam  Constitutional: He is oriented to person, place, and time. He appears well-developed. No distress.  NAD  HENT:  Mouth/Throat: Oropharynx is clear and moist.  Eyes: Conjunctivae are normal. Pupils are equal, round, and reactive to light.  Neck: Normal range of motion. No JVD present. No thyromegaly present.    Cardiovascular: Normal rate, regular rhythm, normal heart sounds and intact distal pulses.  Exam reveals no gallop and no friction rub.   No murmur heard. Pulmonary/Chest: Effort normal and breath sounds normal. No respiratory distress. He has no wheezes. He has no rales. He exhibits no tenderness.  Abdominal: Soft. Bowel sounds are normal. He exhibits no distension and no mass. There is no tenderness. There is no rebound and no guarding.  Musculoskeletal: Normal range of motion. He exhibits no edema or tenderness.  Lymphadenopathy:    He has no cervical adenopathy.  Neurological: He is alert and oriented to person, place, and time. He has normal reflexes. No cranial nerve deficit. He exhibits normal muscle tone. He displays a negative Romberg sign. Coordination and gait normal.  No meningeal signs  Skin: Skin is warm and dry. No rash noted.  Psychiatric: He has a normal mood and affect. His behavior is normal. Judgment and thought content normal.   Lab Results  Component Value Date   WBC 4.8 06/18/2014   HGB 14.4 06/18/2014   HCT 41.6 06/18/2014   PLT 193.0 06/18/2014   GLUCOSE 107* 06/18/2014   CHOL 199 06/18/2014   TRIG 117.0 06/18/2014   HDL 77.50 06/18/2014   LDLCALC 98 06/18/2014   ALT 33 06/18/2014   AST 25 06/18/2014   NA 132* 06/18/2014   K 4.0 06/18/2014   CL 99 06/18/2014   CREATININE 1.4 06/18/2014   BUN 17 06/18/2014   CO2 27 06/18/2014   TSH 1.31 06/18/2014   PSA 0.99 09/28/2012         Assessment & Plan:

## 2014-12-05 ENCOUNTER — Telehealth: Payer: Self-pay | Admitting: Internal Medicine

## 2014-12-05 NOTE — Telephone Encounter (Signed)
emmi emailed °

## 2015-03-06 ENCOUNTER — Ambulatory Visit (INDEPENDENT_AMBULATORY_CARE_PROVIDER_SITE_OTHER): Payer: 59 | Admitting: Internal Medicine

## 2015-03-06 ENCOUNTER — Encounter: Payer: Self-pay | Admitting: Internal Medicine

## 2015-03-06 VITALS — BP 139/82 | HR 64 | Wt 176.0 lb

## 2015-03-06 DIAGNOSIS — I10 Essential (primary) hypertension: Secondary | ICD-10-CM

## 2015-03-06 DIAGNOSIS — Z566 Other physical and mental strain related to work: Secondary | ICD-10-CM | POA: Diagnosis not present

## 2015-03-06 DIAGNOSIS — R0789 Other chest pain: Secondary | ICD-10-CM | POA: Diagnosis not present

## 2015-03-06 NOTE — Progress Notes (Signed)
Pre visit review using our clinic review tool, if applicable. No additional management support is needed unless otherwise documented below in the visit note. 

## 2015-03-06 NOTE — Assessment & Plan Note (Signed)
Resolved

## 2015-03-06 NOTE — Progress Notes (Signed)
Subjective:    Migraine  This is a recurrent problem. The current episode started more than 1 year ago. The problem occurs intermittently. The problem has been resolved. The pain is located in the bilateral region. The pain does not radiate. The pain quality is similar to prior headaches. The quality of the pain is described as pulsating. The pain is mild. Associated symptoms include ear pain. Pertinent negatives include no abdominal pain, blurred vision, eye pain, fever, hearing loss or sore throat. The symptoms are aggravated by activity. He has tried Excedrin, NSAIDs and triptans for the symptoms. The treatment provided mild relief. His past medical history is significant for hypertension, migraine headaches and migraines in the family.  Hypertension This is a new problem. The current episode started more than 1 month ago. The problem is unchanged. Pertinent negatives include no blurred vision. There are no known risk factors for coronary artery disease. Past treatments include nothing.  Treximet did not work. Brother takes Maxalt F/u chest tightness today - stressed less at work  F/u elevated BP, SBP 115-140 at home - on Losartan. BP 139/82   BP Readings from Last 3 Encounters:  03/06/15 150/96  12/04/14 130/82  11/13/14 146/78      Review of Systems  Constitutional: Negative for fever and fatigue.  HENT: Positive for congestion and ear pain. Negative for dental problem, hearing loss, mouth sores, sore throat and voice change.   Eyes: Negative for blurred vision and pain.  Gastrointestinal: Negative for abdominal pain.       Objective:   Physical Exam  Constitutional: He is oriented to person, place, and time. He appears well-developed. No distress.  NAD  HENT:  Mouth/Throat: Oropharynx is clear and moist.  Eyes: Conjunctivae are normal. Pupils are equal, round, and reactive to light.  Neck: Normal range of motion. No JVD present. No thyromegaly present.  Cardiovascular:  Normal rate, regular rhythm, normal heart sounds and intact distal pulses.  Exam reveals no gallop and no friction rub.   No murmur heard. Pulmonary/Chest: Effort normal and breath sounds normal. No respiratory distress. He has no wheezes. He has no rales. He exhibits no tenderness.  Abdominal: Soft. Bowel sounds are normal. He exhibits no distension and no mass. There is no tenderness. There is no rebound and no guarding.  Musculoskeletal: Normal range of motion. He exhibits no edema or tenderness.  Lymphadenopathy:    He has no cervical adenopathy.  Neurological: He is alert and oriented to person, place, and time. He has normal reflexes. No cranial nerve deficit. He exhibits normal muscle tone. He displays a negative Romberg sign. Coordination and gait normal.  No meningeal signs  Skin: Skin is warm and dry. No rash noted.  Psychiatric: He has a normal mood and affect. His behavior is normal. Judgment and thought content normal.   Lab Results  Component Value Date   WBC 4.8 06/18/2014   HGB 14.4 06/18/2014   HCT 41.6 06/18/2014   PLT 193.0 06/18/2014   GLUCOSE 107* 06/18/2014   CHOL 199 06/18/2014   TRIG 117.0 06/18/2014   HDL 77.50 06/18/2014   LDLCALC 98 06/18/2014   ALT 33 06/18/2014   AST 25 06/18/2014   NA 132* 06/18/2014   K 4.0 06/18/2014   CL 99 06/18/2014   CREATININE 1.4 06/18/2014   BUN 17 06/18/2014   CO2 27 06/18/2014   TSH 1.31 06/18/2014   PSA 0.99 09/28/2012         Assessment & Plan:

## 2015-03-06 NOTE — Assessment & Plan Note (Signed)
SBP 115-140 at home Cont Losartan

## 2015-03-06 NOTE — Assessment & Plan Note (Signed)
6/16 better - William [name] changed job responsibilities

## 2015-05-07 ENCOUNTER — Other Ambulatory Visit: Payer: Self-pay | Admitting: Internal Medicine

## 2015-06-11 ENCOUNTER — Ambulatory Visit (INDEPENDENT_AMBULATORY_CARE_PROVIDER_SITE_OTHER): Payer: 59 | Admitting: Internal Medicine

## 2015-06-11 ENCOUNTER — Encounter: Payer: Self-pay | Admitting: Internal Medicine

## 2015-06-11 VITALS — BP 124/76 | HR 68 | Temp 99.6°F | Ht 68.0 in | Wt 177.0 lb

## 2015-06-11 DIAGNOSIS — J309 Allergic rhinitis, unspecified: Secondary | ICD-10-CM | POA: Diagnosis not present

## 2015-06-11 DIAGNOSIS — I10 Essential (primary) hypertension: Secondary | ICD-10-CM

## 2015-06-11 DIAGNOSIS — J019 Acute sinusitis, unspecified: Secondary | ICD-10-CM

## 2015-06-11 MED ORDER — HYDROCODONE-HOMATROPINE 5-1.5 MG/5ML PO SYRP: 5.0000 mL | ORAL_SOLUTION | Freq: Four times a day (QID) | ORAL | Status: DC | PRN

## 2015-06-11 MED ORDER — AZITHROMYCIN 250 MG PO TABS: ORAL_TABLET | ORAL | Status: DC

## 2015-06-11 NOTE — Patient Instructions (Signed)
Please take all new medication as prescribed - the antibiotic, and cough medicine  You can also take Mucinex (or it's generic off brand) for congestion, and tylenol as needed for pain.  Please continue all other medications as before, and refills have been done if requested.  Please have the pharmacy call with any other refills you may need.  You are otherwise up to date with prevention measures today.  Please keep your appointments with your specialists as you may have planned

## 2015-06-11 NOTE — Progress Notes (Signed)
Pre visit review using our clinic review tool, if applicable. No additional management support is needed unless otherwise documented below in the visit note. 

## 2015-06-11 NOTE — Progress Notes (Signed)
Subjective:    Patient ID: William [name], male    DOB: February 28, 1951, 64 y.o.   MRN: [id number]  HPI  Here with 2-3 days acute onset fever, facial pain, pressure, headache, general weakness and malaise, and greenish d/c, with mild ST and cough, but pt denies chest pain, wheezing, increased sob or doe, orthopnea, PND, increased LE swelling, palpitations, dizziness or syncope.   Allergies have not been a factor recently, Denies ongoing nasal allergy symptoms with clearish congestion, itch and sneezing.  Pt denies new neurological symptoms such as new headache, or facial or extremity weakness or numbness   Pt denies polydipsia, polyuria Past Medical History  Diagnosis Date  . Allergic rhinitis   . Anemia, iron deficiency   . Ulcerative colitis     status post total abdominal colectomy with ileoanal anastomosis  . Gastric polyps   . ED (erectile dysfunction)   . GERD (gastroesophageal reflux disease)   . Esophageal stricture     Status post upper endoscopy with esophageal dilatation  . Kidney stone   . Kidney stone   . Inguinal hernia   . Hiatal hernia   . Arthritis   . Blood transfusion without reported diagnosis   . Hypertension    Past Surgical History  Procedure Laterality Date  . Inguinal hernia repair    . Total abdominal colectomy      with ilial anal pull through (for ulcerative colitis)  . Ethmoidectomy      and maxillary enterostomies '04, redo in 12/04  . Knee arthroscopy  2004  . Kidney stones      removed 2013    reports that he has quit smoking. He has never used smokeless tobacco. He reports that he drinks alcohol. He reports that he does not use illicit drugs. family history includes Cancer in his father; Dementia in his mother; Heart disease in his brother; Hypertension in his mother; Rectal cancer in his father. There is no history of Colon cancer, Esophageal cancer, or Stomach cancer. No Known Allergies  Review of Systems  Constitutional: Negative for unusual  diaphoresis or night sweats HENT: Negative for ringing in ear or discharge Eyes: Negative for double vision or worsening visual disturbance.  Respiratory: Negative for choking and stridor.   Gastrointestinal: Negative for vomiting or other signifcant bowel change Genitourinary: Negative for hematuria or change in urine volume.  Musculoskeletal: Negative for other MSK pain or swelling Skin: Negative for color change and worsening wound.  Neurological: Negative for tremors and numbness other than noted  Psychiatric/Behavioral: Negative for decreased concentration or agitation other than above  ;    Objective:   Physical Exam BP 124/76 mmHg  Pulse 68  Temp(Src) 99.6 F (37.6 C) (Oral)  Ht 5' 8"  (1.727 m)  Wt 177 lb (80.287 kg)  BMI 26.92 kg/m2  SpO2 97% VS noted, mild ill Constitutional: Pt appears in no significant distress HENT: Head: NCAT.  Right Ear: External ear normal.  Left Ear: External ear normal.  Eyes: . Pupils are equal, round, and reactive to light. Conjunctivae and EOM are normal Bilat tm's with mild erythema.  Max sinus areas mild tender.  Pharynx with mild erythema, no exudate Neck: Normal range of motion. Neck supple.  Cardiovascular: Normal rate and regular rhythm.   Pulmonary/Chest: Effort normal and breath sounds without rales or wheezing.  Neurological: Pt is alert. Not confused , motor grossly intact Skin: Skin is warm. No rash, no LE edema Psychiatric: Pt behavior is normal. No agitation.  Assessment & Plan:

## 2015-06-12 NOTE — Assessment & Plan Note (Addendum)
Mild ,for otc allegra prn, to f/u any worsening symptoms or concerns

## 2015-06-12 NOTE — Assessment & Plan Note (Signed)
stable overall by history and exam, recent data reviewed with pt, and pt to continue medical treatment as before,  to f/u any worsening symptoms or concerns BP Readings from Last 3 Encounters:  06/11/15 124/76  03/06/15 139/82  12/04/14 130/82

## 2015-06-12 NOTE — Assessment & Plan Note (Signed)
Mild to mod, for antibx course,  to f/u any worsening symptoms or concerns 

## 2015-08-26 ENCOUNTER — Encounter: Payer: Self-pay | Admitting: Family

## 2015-08-26 ENCOUNTER — Ambulatory Visit (INDEPENDENT_AMBULATORY_CARE_PROVIDER_SITE_OTHER): Payer: 59 | Admitting: Family

## 2015-08-26 VITALS — BP 134/88 | HR 71 | Temp 98.1°F | Resp 18 | Ht 68.5 in | Wt 171.0 lb

## 2015-08-26 DIAGNOSIS — J019 Acute sinusitis, unspecified: Secondary | ICD-10-CM | POA: Diagnosis not present

## 2015-08-26 MED ORDER — AMOXICILLIN-POT CLAVULANATE 875-125 MG PO TABS: 1.0000 | ORAL_TABLET | Freq: Two times a day (BID) | ORAL | Status: DC

## 2015-08-26 NOTE — Assessment & Plan Note (Signed)
Symptoms and exam consistent with acute sinusitis. Start Augmentin. Continue over-the-counter medications as needed for symptom relief and supportive care. Follow-up if symptoms worsen or fail to improve.

## 2015-08-26 NOTE — Patient Instructions (Signed)
Thank you for choosing [facility].  Summary/Instructions:  Your prescription(s) have been submitted to your pharmacy or been printed and provided for you. Please take as directed and contact our office if you believe you are having problem(s) with the medication(s) or have any questions.  If your symptoms worsen or fail to improve, please contact our office for further instruction, or in case of emergency go directly to the emergency room at the closest medical facility.   General Recommendations:    Please drink plenty of fluids.  Get plenty of rest   Sleep in humidified air  Use saline nasal sprays  Netti pot   OTC Medications:  Decongestants - helps relieve congestion   Flonase (generic fluticasone) or Nasacort (generic triamcinolone) - please make sure to use the "cross-over" technique at a 45 degree angle towards the opposite eye as opposed to straight up the nasal passageway.   Sudafed (generic pseudoephedrine - Note this is the one that is available behind the pharmacy counter); Products with phenylephrine (-PE) may also be used but is often not as effective as pseudoephedrine.   If you have HIGH BLOOD PRESSURE - Coricidin HBP; AVOID any product that is -D as this contains pseudoephedrine which may increase your blood pressure.  Afrin (oxymetazoline) every 6-8 hours for up to 3 days.   Allergies - helps relieve runny nose, itchy eyes and sneezing   Claritin (generic loratidine), Allegra (fexofenidine), or Zyrtec (generic cyrterizine) for runny nose. These medications should not cause drowsiness.  Note - Benadryl (generic diphenhydramine) may be used however may cause drowsiness  Cough -   Delsym or Robitussin (generic dextromethorphan)  Expectorants - helps loosen mucus to ease removal   Mucinex (generic guaifenesin) as directed on the package.  Headaches / General Aches   Tylenol (generic acetaminophen) - DO NOT EXCEED 3 grams (3,000 mg) in a 24  hour time period  Advil/Motrin (generic ibuprofen)   Sore Throat -   Salt water gargle   Chloraseptic (generic benzocaine) spray or lozenges / Sucrets (generic dyclonine)    Sinusitis Sinusitis is redness, soreness, and inflammation of the paranasal sinuses. Paranasal sinuses are air pockets within the bones of your face (beneath the eyes, the middle of the forehead, or above the eyes). In healthy paranasal sinuses, mucus is able to drain out, and air is able to circulate through them by way of your nose. However, when your paranasal sinuses are inflamed, mucus and air can become trapped. This can allow bacteria and other germs to grow and cause infection. Sinusitis can develop quickly and last only a short time (acute) or continue over a long period (chronic). Sinusitis that lasts for more than 12 weeks is considered chronic.  CAUSES  Causes of sinusitis include:  Allergies.  Structural abnormalities, such as displacement of the cartilage that separates your nostrils (deviated septum), which can decrease the air flow through your nose and sinuses and affect sinus drainage.  Functional abnormalities, such as when the small hairs (cilia) that line your sinuses and help remove mucus do not work properly or are not present. SIGNS AND SYMPTOMS  Symptoms of acute and chronic sinusitis are the same. The primary symptoms are pain and pressure around the affected sinuses. Other symptoms include:  Upper toothache.  Earache.  Headache.  Bad breath.  Decreased sense of smell and taste.  A cough, which worsens when you are lying flat.  Fatigue.  Fever.  Thick drainage from your nose, which often is green and may  contain pus (purulent).  Swelling and warmth over the affected sinuses. DIAGNOSIS  Your health care provider will perform a physical exam. During the exam, your health care provider may:  Look in your nose for signs of abnormal growths in your nostrils (nasal  polyps).  Tap over the affected sinus to check for signs of infection.  View the inside of your sinuses (endoscopy) using an imaging device that has a light attached (endoscope). If your health care provider suspects that you have chronic sinusitis, one or more of the following tests may be recommended:  Allergy tests.  Nasal culture. A sample of mucus is taken from your nose, sent to a lab, and screened for bacteria.  Nasal cytology. A sample of mucus is taken from your nose and examined by your health care provider to determine if your sinusitis is related to an allergy. TREATMENT  Most cases of acute sinusitis are related to a viral infection and will resolve on their own within 10 days. Sometimes medicines are prescribed to help relieve symptoms (pain medicine, decongestants, nasal steroid sprays, or saline sprays).  However, for sinusitis related to a bacterial infection, your health care provider will prescribe antibiotic medicines. These are medicines that will help kill the bacteria causing the infection.  Rarely, sinusitis is caused by a fungal infection. In theses cases, your health care provider will prescribe antifungal medicine. For some cases of chronic sinusitis, surgery is needed. Generally, these are cases in which sinusitis recurs more than 3 times per year, despite other treatments. HOME CARE INSTRUCTIONS   Drink plenty of water. Water helps thin the mucus so your sinuses can drain more easily.  Use a humidifier.  Inhale steam 3 to 4 times a day (for example, sit in the bathroom with the shower running).  Apply a warm, moist washcloth to your face 3 to 4 times a day, or as directed by your health care provider.  Use saline nasal sprays to help moisten and clean your sinuses.  Take medicines only as directed by your health care provider.  If you were prescribed either an antibiotic or antifungal medicine, finish it all even if you start to feel better. SEEK IMMEDIATE  MEDICAL CARE IF:  You have increasing pain or severe headaches.  You have nausea, vomiting, or drowsiness.  You have swelling around your face.  You have vision problems.  You have a stiff neck.  You have difficulty breathing. MAKE SURE YOU:   Understand these instructions.  Will watch your condition.  Will get help right away if you are not doing well or get worse. Document Released: 08/30/2005 Document Revised: 01/14/2014 Document Reviewed: 09/14/2011 [facility] Patient Information 2015 [place], [place]. This information is not intended to replace advice given to you by your health care provider. Make sure you discuss any questions you have with your health care provider.

## 2015-08-26 NOTE — Progress Notes (Signed)
Pre visit review using our clinic review tool, if applicable. No additional management support is needed unless otherwise documented below in the visit note. 

## 2015-08-26 NOTE — Progress Notes (Signed)
Subjective:    Patient ID: William [name], male    DOB: Apr 29, 1951, 64 y.o.   MRN: [id number]  Chief Complaint  Patient presents with  . Cough    sinus pressure, fatigue, sore throat, productive cough    HPI:  William [name] is a 64 y.o. male who  has a past medical history of Allergic rhinitis; Anemia, iron deficiency; Ulcerative colitis; Gastric polyps; ED (erectile dysfunction); GERD (gastroesophageal reflux disease); Esophageal stricture; Kidney stone; Kidney stone; Inguinal hernia; Hiatal hernia; Arthritis; Blood transfusion without reported diagnosis; and Hypertension. and presents today for an acute office visit.  Associated symptoms of sinus pressure, fatigue, sore throat, and productive cough have been going on for approximately the last several days. Symptoms have worsened since the onset. Modifying factors include Mucinex, advil and Sudafed which have helped manage his symptoms slightly but not altered the course of the symptoms. Denies fevers. Recently completed a course of azithromycin about 3 months.   No Known Allergies   Current Outpatient Prescriptions on File Prior to Visit  Medication Sig Dispense Refill  . aspirin (ASPIRIN CHILDRENS) 81 MG chewable tablet Chew 1 tablet (81 mg total) by mouth daily. 36 tablet 11  . Butalbital-Acetaminophen (BUPAP) 50-300 MG TABS Take 1 tablet by mouth 2 (two) times daily as needed. 60 tablet 2  . ferrous sulfate 325 (65 FE) MG tablet Take 325 mg by mouth daily with breakfast.      . Multiple Vitamin (MULTIVITAMIN) tablet Take 1 tablet by mouth daily.    [name] omeprazole (PRILOSEC) 40 MG capsule take 1 capsule by mouth twice a day 60 capsule 6  . potassium citrate (UROCIT-K 10) 10 MEQ (1080 MG) SR tablet Take 10 mEq by mouth 2 (two) times daily.     . psyllium (METAMUCIL) 58.6 % powder Take 1 packet by mouth 2 (two) times daily. 1 TBS Twice Daily.    . Sildenafil Citrate (VIAGRA PO) Take by mouth.     No current facility-administered  medications on file prior to visit.    Review of Systems  Constitutional: Positive for fatigue. Negative for fever and chills.  HENT: Positive for congestion, sinus pressure and sore throat. Negative for ear pain.   Respiratory: Positive for cough. Negative for chest tightness and shortness of breath.       Objective:    BP 134/88 mmHg  Pulse 71  Temp(Src) 98.1 F (36.7 C) (Oral)  Resp 18  Ht 5' 8.5" (1.74 m)  Wt 171 lb (77.565 kg)  BMI 25.62 kg/m2  SpO2 96% Nursing note and vital signs reviewed.  Physical Exam  Constitutional: He is oriented to person, place, and time. He appears well-developed and well-nourished. No distress.  HENT:  Right Ear: Hearing, tympanic membrane, external ear and ear canal normal.  Left Ear: Hearing, tympanic membrane, external ear and ear canal normal.  Nose: Right sinus exhibits maxillary sinus tenderness and frontal sinus tenderness. Left sinus exhibits maxillary sinus tenderness and frontal sinus tenderness.  Mouth/Throat: Uvula is midline, oropharynx is clear and moist and mucous membranes are normal.  Neck: Neck supple.  Cardiovascular: Normal rate, regular rhythm, normal heart sounds and intact distal pulses.   Pulmonary/Chest: Effort normal and breath sounds normal.  Neurological: He is alert and oriented to person, place, and time.  Skin: Skin is warm and dry.  Psychiatric: He has a normal mood and affect. His behavior is normal. Judgment and thought content normal.       Assessment & Plan:  Problem List Items Addressed This Visit      Respiratory   Acute sinusitis - Primary    Symptoms and exam consistent with acute sinusitis. Start Augmentin. Continue over-the-counter medications as needed for symptom relief and supportive care. Follow-up if symptoms worsen or fail to improve.      Relevant Medications   amoxicillin-clavulanate (AUGMENTIN) 875-125 MG tablet

## 2015-10-20 ENCOUNTER — Encounter: Payer: Self-pay | Admitting: Internal Medicine

## 2015-11-12 ENCOUNTER — Ambulatory Visit (INDEPENDENT_AMBULATORY_CARE_PROVIDER_SITE_OTHER): Payer: Managed Care, Other (non HMO) | Admitting: Internal Medicine

## 2015-11-12 ENCOUNTER — Encounter: Payer: Self-pay | Admitting: Internal Medicine

## 2015-11-12 VITALS — BP 158/90 | HR 60 | Ht 67.0 in | Wt 163.0 lb

## 2015-11-12 DIAGNOSIS — Z566 Other physical and mental strain related to work: Secondary | ICD-10-CM | POA: Diagnosis not present

## 2015-11-12 DIAGNOSIS — H538 Other visual disturbances: Secondary | ICD-10-CM | POA: Diagnosis not present

## 2015-11-12 DIAGNOSIS — R030 Elevated blood-pressure reading, without diagnosis of hypertension: Secondary | ICD-10-CM | POA: Diagnosis not present

## 2015-11-12 DIAGNOSIS — IMO0001 Reserved for inherently not codable concepts without codable children: Secondary | ICD-10-CM

## 2015-11-12 DIAGNOSIS — R41842 Visuospatial deficit: Secondary | ICD-10-CM

## 2015-11-12 DIAGNOSIS — R251 Tremor, unspecified: Secondary | ICD-10-CM | POA: Diagnosis not present

## 2015-11-12 MED ORDER — LOSARTAN POTASSIUM 50 MG PO TABS: 50.0000 mg | ORAL_TABLET | Freq: Every day | ORAL | Status: DC

## 2015-11-12 NOTE — Assessment & Plan Note (Signed)
Re-start Losartan

## 2015-11-12 NOTE — Assessment & Plan Note (Signed)
A little better

## 2015-11-12 NOTE — Progress Notes (Signed)
Pre visit review using our clinic review tool, if applicable. No additional management support is needed unless otherwise documented below in the visit note. 

## 2015-11-12 NOTE — Assessment & Plan Note (Signed)
2/17 new ?etiology Neurology ref - Dr Carles Collet

## 2015-11-12 NOTE — Assessment & Plan Note (Signed)
2/17 ?? Subjective Neurol ref Dr Carles Collet
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
## 2015-11-19 ENCOUNTER — Other Ambulatory Visit (INDEPENDENT_AMBULATORY_CARE_PROVIDER_SITE_OTHER): Payer: Managed Care, Other (non HMO)

## 2015-11-19 ENCOUNTER — Ambulatory Visit (INDEPENDENT_AMBULATORY_CARE_PROVIDER_SITE_OTHER): Payer: Managed Care, Other (non HMO) | Admitting: Neurology

## 2015-11-19 ENCOUNTER — Encounter: Payer: Self-pay | Admitting: Neurology

## 2015-11-19 VITALS — BP 136/80 | HR 65 | Ht 67.5 in | Wt 165.0 lb

## 2015-11-19 DIAGNOSIS — Z7289 Other problems related to lifestyle: Secondary | ICD-10-CM

## 2015-11-19 DIAGNOSIS — Z789 Other specified health status: Secondary | ICD-10-CM

## 2015-11-19 DIAGNOSIS — G20 Parkinson's disease: Secondary | ICD-10-CM

## 2015-11-19 LAB — CBC WITH DIFFERENTIAL/PLATELET
BASOS ABS: 0 10*3/uL (ref 0.0–0.1)
BASOS PCT: 0.7 % (ref 0.0–3.0)
EOS PCT: 3.2 % (ref 0.0–5.0)
Eosinophils Absolute: 0.1 10*3/uL (ref 0.0–0.7)
HEMATOCRIT: 41.6 % (ref 39.0–52.0)
Hemoglobin: 14.3 g/dL (ref 13.0–17.0)
LYMPHS ABS: 0.6 10*3/uL — AB (ref 0.7–4.0)
LYMPHS PCT: 17.1 % (ref 12.0–46.0)
MCHC: 34.5 g/dL (ref 30.0–36.0)
MCV: 98.3 fl (ref 78.0–100.0)
MONOS PCT: 10.9 % (ref 3.0–12.0)
Monocytes Absolute: 0.4 10*3/uL (ref 0.1–1.0)
NEUTROS ABS: 2.5 10*3/uL (ref 1.4–7.7)
Neutrophils Relative %: 68.1 % (ref 43.0–77.0)
PLATELETS: 177 10*3/uL (ref 150.0–400.0)
RBC: 4.23 Mil/uL (ref 4.22–5.81)
RDW: 12.6 % (ref 11.5–15.5)
WBC: 3.7 10*3/uL — ABNORMAL LOW (ref 4.0–10.5)

## 2015-11-19 LAB — COMPREHENSIVE METABOLIC PANEL
ALT: 30 U/L (ref 0–53)
AST: 22 U/L (ref 0–37)
Albumin: 4.4 g/dL (ref 3.5–5.2)
Alkaline Phosphatase: 33 U/L — ABNORMAL LOW (ref 39–117)
BILIRUBIN TOTAL: 0.9 mg/dL (ref 0.2–1.2)
BUN: 17 mg/dL (ref 6–23)
CALCIUM: 9.4 mg/dL (ref 8.4–10.5)
CO2: 24 meq/L (ref 19–32)
Chloride: 96 mEq/L (ref 96–112)
Creatinine, Ser: 0.83 mg/dL (ref 0.40–1.50)
GFR: 99.04 mL/min (ref >60.00)
Glucose, Bld: 99 mg/dL (ref 70–99)
POTASSIUM: 4.2 meq/L (ref 3.5–5.1)
Sodium: 128 mEq/L — ABNORMAL LOW (ref 135–145)
Total Protein: 6.6 g/dL (ref 6.0–8.3)

## 2015-11-19 LAB — TSH: TSH: 1.4 u[IU]/mL (ref 0.35–4.50)

## 2015-11-19 LAB — VITAMIN B12: VITAMIN B 12: 458 pg/mL (ref 211–911)

## 2015-11-19 NOTE — Progress Notes (Signed)
William [name] was seen today in the movement disorders clinic for neurologic consultation at the request of Walker Kehr, MD.  The consultation is for the evaluation of tremor and gait instability.  Tremor is in both hands, but it is more in the R hand.  Pt states that he thought it was going on for 3 months but when he talked to his brother, he said he noted jaw tremor last summer.  He notes it at rest.  He also noted some shuffling.     Tremor: Yes.     Affected by caffeine: unknown (2 cups coffee; 1 soda per day sometimes)  Affected by alcohol:  2 cocktails and split bottle of wine with wife per night and notes it calms tremor  Affected by stress:  Unknown but does state that is under a lot of stress  Affected by fatigue:  No.  Spills soup if on spoon:  No., but does lean over the bowl  Spills glass of liquid if full:  No.   Other Specific Symptoms:  Voice: no change per pt Sleep: sleeps well  Vivid Dreams:  Yes.    Acting out dreams:  Yes.  , but rarely Wet Pillows: No. Postural symptoms:  Yes.    Falls?  No. Bradykinesia symptoms: shuffling gait and difficulty getting out of a chair Loss of smell:  No. Loss of taste:  No. Urinary Incontinence:  No. Difficulty Swallowing:  No. Handwriting, micrographia: No. Trouble with ADL's:  No. but does note decreased agility with putting arms in sleeves  Trouble buttoning clothing: No. Depression:  No. (but more easily agitated when fatigued) Memory changes:  Yes.  , primarily short term (able to remember finances, do job, remember pills) Hallucinations:  No.  visual distortions: No. N/V:  No. Lightheaded:  No.  Syncope: No. Diplopia:  No. Dyskinesia:  No.  Fam hx tremor:  no  Neuroimaging has not previously been performed.  Only had CT sinus   ALLERGIES:  No Known Allergies  CURRENT MEDICATIONS:  Outpatient Encounter Prescriptions as of 11/19/2015  Medication Sig  . aspirin (ASPIRIN CHILDRENS) 81 MG chewable tablet Chew  1 tablet (81 mg total) by mouth daily.  . ferrous sulfate 325 (65 FE) MG tablet Take 325 mg by mouth daily with breakfast.    . losartan (COZAAR) 50 MG tablet Take 1 tablet (50 mg total) by mouth daily.  [name] omeprazole (PRILOSEC) 40 MG capsule take 1 capsule by mouth twice a day  . potassium citrate (UROCIT-K 10) 10 MEQ (1080 MG) SR tablet Take 10 mEq by mouth 2 (two) times daily.   . psyllium (METAMUCIL) 58.6 % powder Take 1 packet by mouth 2 (two) times daily. 1 TBS Twice Daily.  . Sildenafil Citrate (VIAGRA PO) Take by mouth. Reported on 11/12/2015  . Multiple Vitamin (MULTIVITAMIN) tablet Take 1 tablet by mouth daily. Reported on 11/19/2015  . [DISCONTINUED] Butalbital-Acetaminophen (BUPAP) 50-300 MG TABS Take 1 tablet by mouth 2 (two) times daily as needed. (Patient not taking: Reported on 11/12/2015)   No facility-administered encounter medications on file as of 11/19/2015.    PAST MEDICAL HISTORY:   Past Medical History  Diagnosis Date  . Allergic rhinitis   . Anemia, iron deficiency   . Ulcerative colitis     status post total abdominal colectomy with ileoanal anastomosis  . Gastric polyps   . ED (erectile dysfunction)   . GERD (gastroesophageal reflux disease)   . Esophageal stricture  Status post upper endoscopy with esophageal dilatation  . Kidney stone   . Kidney stone   . Inguinal hernia   . Hiatal hernia   . Arthritis   . Blood transfusion without reported diagnosis   . Hypertension     PAST SURGICAL HISTORY:   Past Surgical History  Procedure Laterality Date  . Inguinal hernia repair    . Colectomy  1992    with ilial anal pull through (for ulcerative colitis)  . Ethmoidectomy      and maxillary enterostomies '04, redo in 12/04  . Knee arthroscopy  2004  . Kidney stone surgery      removed 2013    SOCIAL HISTORY:   Social History   Social History  . Marital Status: Married    Spouse Name: N/A  . Number of Children: N/A  . Years of Education: N/A    Occupational History  . Mortgage Customer service manager    Social History Main Topics  . Smoking status: Former Smoker    Quit date: 09/14/1979  . Smokeless tobacco: Never Used  . Alcohol Use: 0.0 oz/week    0 Standard drinks or equivalent per week     Comment: 1 glass wine nightly during week, 2-3 drinks a day on weekends  . Drug Use: No  . Sexual Activity: Yes   Other Topics Concern  . Not on file   Social History Narrative   [employer] grad   Married 13.5 years- divorced; married '05   No children   Work: Insurance underwriter   Patient is a former smoker. Quit in 1981   Alcohol use- yes social   Daily Caffeine use 2 cups coffee per day   Illicit drug use- no    FAMILY HISTORY:   Family Status  Relation Status Death Age  . Father Deceased     rectal cancer, myeloma  . Mother Deceased     dementia, HTN  . Brother Alive     heart disease  . Brother Alive     sinus issues    ROS:  A complete 10 system review of systems was obtained and was unremarkable apart from what is mentioned above.  PHYSICAL EXAMINATION:    VITALS:   Filed Vitals:   11/19/15 0905  BP: 136/80  Pulse: 65  Height: 5' 7.5" (1.715 m)  Weight: 165 lb (74.844 kg)    GEN:  The patient appears stated age and is in NAD. HEENT:  Normocephalic, atraumatic.  The mucous membranes are moist. The superficial temporal arteries are without ropiness or tenderness. CV:  RRR Lungs:  CTAB Neck/HEME:  There are no carotid bruits bilaterally.  Neurological examination:  Orientation: The patient is alert and oriented x3. Fund of knowledge is appropriate.  Recent and remote memory are intact.  Attention and concentration are normal.    Able to name objects and repeat phrases. Cranial nerves: There is good facial symmetry. There is facial hypomimiaPupils are equal round and reactive to light bilaterally. Fundoscopic exam reveals clear margins bilaterally. Extraocular muscles are intact. The visual fields are full to  confrontational testing. The speech is fluent and clear. Soft palate rises symmetrically and there is no tongue deviation. Hearing is intact to conversational tone. Sensation: Sensation is intact to light and pinprick throughout (facial, trunk, extremities). Vibration is intact at the bilateral big toe. There is no extinction with double simultaneous stimulation. There is no sensory dermatomal level identified. Motor: Strength is 5/5 in the bilateral upper and lower extremities.  Shoulder shrug is equal and symmetric.  There is no pronator drift. Deep tendon reflexes: Deep tendon reflexes are 2-/4 at the bilateral biceps, triceps, brachioradialis, patella and achilles. Plantar responses are downgoing bilaterally.  Movement examination: Tone: There is mild increased tone in the right UE only with activation procedures. Abnormal movements: There is an intermittent, non rhythmic, jerking RUE resting tremor that does not increase significantly with distraction procedures.  Similar tremor can be felt in the L hand but seen less.  No postural tremor.  Min trouble with archimedes spirals.  No trouble pouring water from one glass to another.   Coordination:  There is decremation with RAM's, seen esp with alternation supination/pronation of the forearm on the right but also heel taps on the L.  Hand opening and closing and finger taps were good Gait and Station: The patient has no difficulty arising out of a deep-seated chair without the use of the hands. The patient's stride length is good with good arm swing and rarely do the feet scuffle the ground.  The patient has a negative pull test.      LABS  Lab Results  Component Value Date   TSH 1.31 06/18/2014     Chemistry      Component Value Date/Time   NA 132* 06/18/2014 1505   K 4.0 06/18/2014 1505   CL 99 06/18/2014 1505   CO2 27 06/18/2014 1505   BUN 17 06/18/2014 1505   CREATININE 1.4 06/18/2014 1505      Component Value Date/Time   CALCIUM  9.2 06/18/2014 1505   ALKPHOS 32* 06/18/2014 1505   AST 25 06/18/2014 1505   ALT 33 06/18/2014 1505   BILITOT 0.8 06/18/2014 1505       ASSESSMENT/PLAN:  1. Idiopathic Parkinson's disease.  The patient just barely meets [place] brain bank criteria but he does meet it.  -We discussed the diagnosis as well as pathophysiology of the disease.  We discussed treatment options as well as prognostic indicators.  Patient education was provided.  -Greater than 50% of the 80 minute visit was spent in counseling answering questions and talking about what to expect now as well as in the future.  We talked about medication options as well as potential future surgical options.  We talked about safety in the home.  -We decided to hold off on medication therapy for right now.  -We discussed community resources in the area including patient support groups and community exercise programs for PD and pt education was provided to the patient.  Talked to him about the importance of safe, CV exercise.  -will do labs including TSH, B12, chem, cbc 2.  EtOH overuse  -he and I discussed that he needs to decrease EtOH intake.  Currently drinking 2 cocktails a night and splits bottle of wine with wife.  He expressed understanding.   3.  Follow up is anticipated in the next 3-59month, sooner should new neurologic issues arise.

## 2015-11-19 NOTE — Patient Instructions (Signed)
1. Your provider has requested that you have labwork completed today. Please go to [facility] ([id number]) on the second floor of this building before leaving the office today. You do not need to check in. If you are not called within 15 minutes please check with the front desk.  2. Follow up 3-4 months.

## 2015-11-20 ENCOUNTER — Telehealth: Payer: Self-pay | Admitting: Neurology

## 2015-11-20 NOTE — Telephone Encounter (Signed)
Patient made aware. He will make appt with Dr Alain Marion.

## 2015-11-20 NOTE — Telephone Encounter (Signed)
-----   Message from [place], DO sent at 11/19/2015  4:08 PM EST ----- Please let pt know that his Na is low (has always been a bit low but is little lower now than has been) and should f/u with PCP.  WBC count likewise is just a hair low and likely of little consequence but should make f/u with Dr. Alain Marion

## 2015-11-25 ENCOUNTER — Ambulatory Visit (INDEPENDENT_AMBULATORY_CARE_PROVIDER_SITE_OTHER): Payer: Managed Care, Other (non HMO) | Admitting: Internal Medicine

## 2015-11-25 ENCOUNTER — Encounter: Payer: Self-pay | Admitting: Internal Medicine

## 2015-11-25 VITALS — BP 124/66 | HR 78 | Ht 66.5 in | Wt 167.0 lb

## 2015-11-25 DIAGNOSIS — K5190 Ulcerative colitis, unspecified, without complications: Secondary | ICD-10-CM | POA: Diagnosis not present

## 2015-11-25 DIAGNOSIS — K222 Esophageal obstruction: Secondary | ICD-10-CM | POA: Diagnosis not present

## 2015-11-25 DIAGNOSIS — K219 Gastro-esophageal reflux disease without esophagitis: Secondary | ICD-10-CM

## 2015-11-25 DIAGNOSIS — K624 Stenosis of anus and rectum: Secondary | ICD-10-CM

## 2015-11-25 DIAGNOSIS — D509 Iron deficiency anemia, unspecified: Secondary | ICD-10-CM

## 2015-11-25 NOTE — Progress Notes (Signed)
HISTORY OF PRESENT ILLNESS:  William [name] is a 65 y.o. male with a history of erosive esophagitis, treated by peptic stricture, iron deficiency anemia, and remote ulcerative colitis for which he is status post total abdominal colectomy with ileoanal anastomosis greater than 20 years ago. He also has a history of ileoanal anastomotic stricturing with partial bowel obstruction requiring periodic endoscopic pneumatic dilation. He presents today for routine follow-up regarding management of his chronic GI conditions. The patient was last evaluated in the office May 2016. At that time he was experiencing symptoms related to his anastomotic stricture. He subsequently underwent flexible sigmoidoscopy with balloon dilation to 15 mm Elnoria Howard second 2016. He continues on MiraLAX with regulation of his bowels. No symptoms to suggest recurrent stricturing. He also continues on omeprazole for his reflux. No heartburn, indigestion, or recurrent dysphagia. He continues on iron supplementation. Most recent hemoglobin was normal at 14.3. Unfortunate, William [name] tells me that he was recently diagnosed with Parkinson's disease. He has not been started on medical therapy but is engaging in regular exercise.  REVIEW OF SYSTEMS:  All non-GI ROS negative except for arthritis, tremor  Past Medical History  Diagnosis Date  . Allergic rhinitis   . Anemia, iron deficiency   . Ulcerative colitis     status post total abdominal colectomy with ileoanal anastomosis  . Gastric polyps   . ED (erectile dysfunction)   . GERD (gastroesophageal reflux disease)   . Esophageal stricture     Status post upper endoscopy with esophageal dilatation  . Kidney stone     x 2  . Inguinal hernia   . Hiatal hernia   . Arthritis   . Blood transfusion without reported diagnosis   . Hypertension   . Colonic stricture ([place])   . Parkinson disease [facility])     Past Surgical History  Procedure Laterality Date  . Inguinal hernia repair    . Colectomy   1992    with ilial anal pull through (for ulcerative colitis)  . Ethmoidectomy      and maxillary enterostomies '04, redo in 12/04  . Knee arthroscopy Right 2004  . Kidney stone surgery      removed 2013  . Lasik      Social History William [name]  reports that he quit smoking about 36 years ago. He has never used smokeless tobacco. He reports that he drinks alcohol. He reports that he does not use illicit drugs.  family history includes Cancer in his father; Dementia in his mother; Heart disease in his brother; Hypertension in his mother; Rectal cancer in his father. There is no history of Colon cancer, Esophageal cancer, or Stomach cancer.  No Known Allergies     PHYSICAL EXAMINATION: Vital signs: BP 124/66 mmHg  Pulse 78  Ht 5' 6.5" (1.689 m)  Wt 167 lb (75.751 kg)  BMI 26.55 kg/m2  Constitutional: generally well-appearing, no acute distress. Blank facies  Psychiatric: alert and oriented x3, cooperative Eyes: extraocular movements intact, anicteric, conjunctiva pink Mouth: oral pharynx moist, no lesions Neck: supple no lymphadenopathy Cardiovascular: heart regular rate and rhythm, no murmur Lungs: clear to auscultation bilaterally Abdomen: soft, nontender, nondistended, no obvious ascites, no peritoneal signs, normal bowel sounds, no organomegaly Rectal: Omitted Extremities: no clubbing cyanosis or lower extremity edema bilaterally Skin: no lesions on visible extremities Neuro: No focal deficits. Resting tremor  ASSESSMENT:  #1. Remote history of ulcerative colitis status post total abdominal colectomy with ileoanal anastomosis #2. History of anastomotic stricture requiring periodic  balloon dilation therapy. Last dilation March 2016. Currently asymptomatic #3. Chronic constipation. Managed with MiraLAX #4. History of erosive esophagitis and peptic stricture. Asymptomatic on PPI post dilation #5. History of iron deficiency anemia secondary to #4. On chronic iron therapy  with normalization of hemoglobin #6. Recent diagnosis of Parkinson's   PLAN:  #1. Continue MiraLAX for constipation #2. Continue PPI for GERD #3. Continue iron for history of right deficiency anemia #4. Periodic anastomotic stricture dilation as needed #5. Routine GI follow-up one year. Sooner if needed

## 2015-11-25 NOTE — Patient Instructions (Signed)
Please follow up in one year

## 2015-11-26 ENCOUNTER — Ambulatory Visit: Payer: Managed Care, Other (non HMO) | Admitting: Neurology

## 2015-12-11 ENCOUNTER — Other Ambulatory Visit: Payer: Self-pay | Admitting: *Deleted

## 2015-12-11 MED ORDER — OMEPRAZOLE 40 MG PO CPDR: 40.0000 mg | DELAYED_RELEASE_CAPSULE | Freq: Two times a day (BID) | ORAL | Status: DC

## 2016-01-12 ENCOUNTER — Encounter: Payer: Self-pay | Admitting: Family

## 2016-01-12 ENCOUNTER — Ambulatory Visit (INDEPENDENT_AMBULATORY_CARE_PROVIDER_SITE_OTHER): Payer: Managed Care, Other (non HMO) | Admitting: Family

## 2016-01-12 VITALS — BP 162/96 | HR 60 | Temp 98.2°F | Ht 66.5 in | Wt 164.0 lb

## 2016-01-12 DIAGNOSIS — R059 Cough, unspecified: Secondary | ICD-10-CM

## 2016-01-12 DIAGNOSIS — E871 Hypo-osmolality and hyponatremia: Secondary | ICD-10-CM | POA: Diagnosis not present

## 2016-01-12 DIAGNOSIS — R030 Elevated blood-pressure reading, without diagnosis of hypertension: Secondary | ICD-10-CM | POA: Diagnosis not present

## 2016-01-12 DIAGNOSIS — R05 Cough: Secondary | ICD-10-CM

## 2016-01-12 DIAGNOSIS — IMO0001 Reserved for inherently not codable concepts without codable children: Secondary | ICD-10-CM

## 2016-01-12 MED ORDER — AZITHROMYCIN 250 MG PO TABS: ORAL_TABLET | ORAL | Status: DC

## 2016-01-12 MED ORDER — LOSARTAN POTASSIUM 100 MG PO TABS: 100.0000 mg | ORAL_TABLET | Freq: Every day | ORAL | Status: DC

## 2016-01-12 NOTE — Progress Notes (Signed)
Pre visit review using our clinic review tool, if applicable. No additional management support is needed unless otherwise documented below in the visit note. 

## 2016-01-12 NOTE — Patient Instructions (Signed)
I suspect that your infection is viral in nature.  As discussed, I advise that you wait to fill the antibiotic after 1-2 days of symptom management to see if your symptoms improve. If you do not show improvement, you may take the antibiotic as prescribed.  Increase intake of clear fluids. Congestion is best treated by hydration, when mucus is wetter, it is thinner, less sticky, and easier to expel from the body, either through coughing up drainage, or by blowing your nose.   Get plenty of rest.   Use saline nasal drops and blow your nose frequently. Run a humidifier at night and elevate the head of the bed. Vicks Vapor rub will help with congestion and cough. Steam showers and sinus massage for congestion.   Use Acetaminophen or Ibuprofen as needed for fever or pain. Avoid second hand smoke. Even the smallest exposure will worsen symptoms.   Over the counter medications you can try include Delsym for cough, a decongestant for congestion, and Mucinex or Robitussin as an expectorant. Be sure to just get the plain Mucinex or Robitussin that just has one medication (Guaifenesen). We don't recommend the combination products. Note, be sure to drink two glasses of water with each dose of Mucinex as the medication will not work well without adequate hydration.   You can also try a teaspoon of honey to see if this will help reduce cough. Throat lozenges can sometimes be beneficial as well.    This illness will typically last 7 - 10 days.   Please follow up with our clinic if you develop a fever greater than 101 F, symptoms worsen, or do not resolve in the next week.     

## 2016-01-12 NOTE — Progress Notes (Signed)
Subjective:    Patient ID: William [name], male    DOB: October 02, 1950, 65 y.o.   MRN: [id number]   William [name] is a 65 y.o. male who presents today for an acute visit.    HPI Comments: Patient here for evaluation of productive cough which started 3 days ago. Endorses sinus pressure. No fever. Has been taking sudafed and ibuprofen with mild relief.   Recently been diagnosed with Parkinson's.  Past Medical History  Diagnosis Date  . Allergic rhinitis   . Anemia, iron deficiency   . Ulcerative colitis     status post total abdominal colectomy with ileoanal anastomosis  . Gastric polyps   . ED (erectile dysfunction)   . GERD (gastroesophageal reflux disease)   . Esophageal stricture     Status post upper endoscopy with esophageal dilatation  . Kidney stone     x 2  . Inguinal hernia   . Hiatal hernia   . Arthritis   . Blood transfusion without reported diagnosis   . Hypertension   . Colonic stricture ([place])   . Parkinson disease ([place])    Review of patient's allergies indicates no known allergies. Current Outpatient Prescriptions on File Prior to Visit  Medication Sig Dispense Refill  . aspirin (ASPIRIN CHILDRENS) 81 MG chewable tablet Chew 1 tablet (81 mg total) by mouth daily. 36 tablet 11  . ferrous sulfate 325 (65 FE) MG tablet Take 325 mg by mouth daily with breakfast.      . losartan (COZAAR) 50 MG tablet Take 1 tablet (50 mg total) by mouth daily. 30 tablet 11  . Multiple Vitamin (MULTIVITAMIN) tablet Take 1 tablet by mouth daily. Reported on 11/19/2015    . omeprazole (PRILOSEC) 40 MG capsule Take 1 capsule (40 mg total) by mouth 2 (two) times daily. 60 capsule 6  . potassium citrate (UROCIT-K 10) 10 MEQ (1080 MG) SR tablet Take 10 mEq by mouth 2 (two) times daily.     . psyllium (METAMUCIL) 58.6 % powder Take 1 packet by mouth 2 (two) times daily. 1 TBS Twice Daily.    . Sildenafil Citrate (VIAGRA PO) Take by mouth. Reported on 11/12/2015     No current  facility-administered medications on file prior to visit.    Social History  Substance Use Topics  . Smoking status: Former Smoker    Quit date: 09/14/1979  . Smokeless tobacco: Never Used  . Alcohol Use: 0.0 oz/week    0 Standard drinks or equivalent per week     Comment: 1 glass wine nightly during week, 2-3 drinks a day on weekends    Review of Systems  Constitutional: Negative for fever and chills.  HENT: Positive for congestion, sinus pressure (frontal) and sore throat. Negative for ear pain and rhinorrhea.   Respiratory: Positive for cough. Negative for shortness of breath and wheezing.   Cardiovascular: Negative for chest pain and palpitations.  Gastrointestinal: Negative for nausea, vomiting and diarrhea.  Musculoskeletal: Negative for myalgias.  Neurological: Negative for headaches.      Objective:    BP 148/96 mmHg  Pulse 60  Temp(Src) 98.2 F (36.8 C) (Oral)  Ht 5' 6.5" (1.689 m)  Wt 164 lb (74.39 kg)  BMI 26.08 kg/m2  SpO2 97%   Physical Exam  Constitutional: Vital signs are normal. He appears well-developed and well-nourished.  HENT:  Head: Normocephalic and atraumatic.  Right Ear: Hearing, tympanic membrane, external ear and ear canal normal. No drainage, swelling or tenderness. Tympanic membrane is  not injected, not erythematous and not bulging. No middle ear effusion. No decreased hearing is noted.  Left Ear: Hearing, tympanic membrane, external ear and ear canal normal. No drainage, swelling or tenderness. Tympanic membrane is not injected, not erythematous and not bulging.  No middle ear effusion. No decreased hearing is noted.  Nose: Nose normal. Right sinus exhibits no maxillary sinus tenderness and no frontal sinus tenderness. Left sinus exhibits no maxillary sinus tenderness and no frontal sinus tenderness.  Mouth/Throat: Uvula is midline, oropharynx is clear and moist and mucous membranes are normal. No oropharyngeal exudate, posterior oropharyngeal  edema, posterior oropharyngeal erythema or tonsillar abscesses.  Eyes: Conjunctivae are normal.  Cardiovascular: Regular rhythm and normal heart sounds.   Pulmonary/Chest: Effort normal and breath sounds normal. No respiratory distress. He has no wheezes. He has no rhonchi. He has no rales.  Lymphadenopathy:       Head (right side): No submental, no submandibular, no tonsillar, no preauricular, no posterior auricular and no occipital adenopathy present.       Head (left side): No submental, no submandibular, no tonsillar, no preauricular, no posterior auricular and no occipital adenopathy present.    He has no cervical adenopathy.  Neurological: He is alert.  Skin: Skin is warm and dry.  Psychiatric: He has a normal mood and affect. His speech is normal and behavior is normal.  Vitals reviewed.      Assessment & Plan:  1. Cough Suspect viral etiology. Patient agreed on conservative measures at this time and delayed antibiotics.  - azithromycin (ZITHROMAX) 250 MG tablet; Tale 500 mg PO on day 1, then 250 mg PO q24h x 4 days.  Dispense: 6 tablet; Refill: 0  2. Hyponatremia- chronic   Appears chronic over last 3 years; rechecking BMP and aldosterone.   - Aldosterone  - Basic Metabolic Panel (BMET); Future  3. Elevated BP  Pressures elevated today, however suspect this is worsened Sudafed as prior blood pressure in our clinic had been 124/66. Advised and gave patient education regarding decongestants and to avoid them and hypertension.   When nurse took the BP a second time, it has increased to 162/96.   -Repeat BMP to check hyponatremia.  -Trial of losartan 100 mg QD  and come back for blood pressure check.   Discussed with patient's PCP and we agreed with plan  I am having William [name] maintain his ferrous sulfate, potassium citrate, multivitamin, psyllium, Sildenafil Citrate (VIAGRA PO), aspirin, losartan, and omeprazole.     Start medications as prescribed and explained to  patient on After Visit Summary ( AVS). Risks, benefits, and alternatives of the medications and treatment plan prescribed today were discussed, and patient expressed understanding.   Education regarding symptom management and diagnosis given to patient.   Follow-up:Plan follow-up as discussed or as needed if any worsening symptoms or change in condition. No Follow-up on file.  BP check  Continue to follow with Walker Kehr, MD for routine health maintenance.   [name] and I agreed with plan.   Mable Paris, FNP

## 2016-01-13 ENCOUNTER — Telehealth: Payer: Self-pay

## 2016-01-13 NOTE — Telephone Encounter (Signed)
Pt contacted and given provider recommendation.

## 2016-01-13 NOTE — Telephone Encounter (Signed)
-----   Message from Burnard Hawthorne, [place] sent at 01/12/2016  2:08 PM EDT ----- Please call patient and ask him to come in back for lab as soon as he can. Ordered aldosterone and BMP.   Please make him aware that his sodium is low. How much water is he drinking/day??   Also spoke with PCP and I decided to increase his losartan. I made that change his medication list. He will be taking one 100 mg tablet a day. please ensure he has an appointment made with the nurses or the provider here for blood pressure check and also to check his sodium in 1 month.

## 2016-01-13 NOTE — Addendum Note (Signed)
Addended by: Aviva Signs M on: 01/13/2016 10:20 AM   Modules accepted: Miquel Dunn

## 2016-01-14 ENCOUNTER — Other Ambulatory Visit: Payer: Self-pay

## 2016-01-16 ENCOUNTER — Other Ambulatory Visit (INDEPENDENT_AMBULATORY_CARE_PROVIDER_SITE_OTHER): Payer: Managed Care, Other (non HMO)

## 2016-01-16 DIAGNOSIS — E871 Hypo-osmolality and hyponatremia: Secondary | ICD-10-CM

## 2016-01-16 LAB — BASIC METABOLIC PANEL
BUN: 20 mg/dL (ref 6–23)
CHLORIDE: 96 meq/L (ref 96–112)
CO2: 24 mEq/L (ref 19–32)
Calcium: 9.5 mg/dL (ref 8.4–10.5)
Creatinine, Ser: 0.98 mg/dL (ref 0.40–1.50)
GFR: 81.72 mL/min (ref >60.00)
GLUCOSE: 97 mg/dL (ref 70–99)
POTASSIUM: 4.4 meq/L (ref 3.5–5.1)
SODIUM: 129 meq/L — AB (ref 135–145)

## 2016-01-23 LAB — ALDOSTERONE + RENIN ACTIVITY W/ RATIO
ALDO / PRA Ratio: 12 Ratio (ref 0.9–28.9)
ALDOSTERONE: 6 ng/dL
PRA LC/MS/MS: 0.5 ng/mL/h (ref 0.25–5.82)

## 2016-01-26 ENCOUNTER — Telehealth: Payer: Self-pay | Admitting: Family

## 2016-01-26 DIAGNOSIS — E871 Hypo-osmolality and hyponatremia: Secondary | ICD-10-CM

## 2016-01-26 NOTE — Telephone Encounter (Signed)
Spoke with patient at length about normal aldosterone and what appears to be chronic low hyponatremia. 2014 BMP shows sodium 130. Patient is going to come in for chest x-ray to ensure no malignancy which can be associated with low sodium. Reports since diagnosis of Parkinson's, he has been working out a lot, wonders if his perspiration is contributing to low salt. He states he does not drink adequate water.  His blood pressure on increased dose of losartan 100 mg daily is 133/80. Patient verbalized understanding of plan.

## 2016-02-06 ENCOUNTER — Encounter: Payer: Self-pay | Admitting: Family

## 2016-02-06 ENCOUNTER — Ambulatory Visit (INDEPENDENT_AMBULATORY_CARE_PROVIDER_SITE_OTHER): Payer: Managed Care, Other (non HMO) | Admitting: Family

## 2016-02-06 VITALS — BP 140/80 | HR 70 | Temp 98.0°F | Resp 16 | Ht 67.25 in | Wt 162.0 lb

## 2016-02-06 DIAGNOSIS — H6982 Other specified disorders of Eustachian tube, left ear: Secondary | ICD-10-CM

## 2016-02-06 DIAGNOSIS — H6980 Other specified disorders of Eustachian tube, unspecified ear: Secondary | ICD-10-CM | POA: Insufficient documentation

## 2016-02-06 DIAGNOSIS — H6990 Unspecified Eustachian tube disorder, unspecified ear: Secondary | ICD-10-CM | POA: Insufficient documentation

## 2016-02-06 MED ORDER — MONTELUKAST SODIUM 10 MG PO TABS: 10.0000 mg | ORAL_TABLET | Freq: Every day | ORAL | Status: DC

## 2016-02-06 NOTE — Progress Notes (Signed)
Subjective:    Patient ID: William [name], male    DOB: Nov 12, 1950, 64 y.o.   MRN: [id number]  Chief Complaint  Patient presents with  . Ear Pain    having popping in left ear for several days, now having some pain with it    HPI:  William [name] is [name] 65 y.o. male who  has [name] past medical history of Allergic rhinitis; Anemia, iron deficiency; Ulcerative colitis; Gastric polyps; ED (erectile dysfunction); GERD (gastroesophageal reflux disease); Esophageal stricture; Kidney stone; Inguinal hernia; Hiatal hernia; Arthritis; Blood transfusion without reported diagnosis; Hypertension; Colonic stricture ([place]); and Parkinson disease ([place]). and presents today for an acute office visit.   This is [name] new problem. Associated symptom of [name] popping sensation in his left ear has been going on for several days. Course has worsen slightly with some pain described as achy. Denies any fevers or discharge. Denies any modifying factors/treatments in attempt to make it better. Seen in the office about 3 weeks ago for an upper respiratory infection that improved with symptom management. No recent antibiotic use.  No Known Allergies   Current Outpatient Prescriptions on File Prior to Visit  Medication Sig Dispense Refill  . aspirin (ASPIRIN CHILDRENS) 81 MG chewable tablet Chew 1 tablet (81 mg total) by mouth daily. 36 tablet 11  . ferrous sulfate 325 (65 FE) MG tablet Take 325 mg by mouth daily with breakfast.      . losartan (COZAAR) 100 MG tablet Take 1 tablet (100 mg total) by mouth daily. 30 tablet 3  . Multiple Vitamin (MULTIVITAMIN) tablet Take 1 tablet by mouth daily. Reported on 11/19/2015    . omeprazole (PRILOSEC) 40 MG capsule Take 1 capsule (40 mg total) by mouth 2 (two) times daily. 60 capsule 6  . potassium citrate (UROCIT-K 10) 10 MEQ (1080 MG) SR tablet Take 10 mEq by mouth 2 (two) times daily.     . psyllium (METAMUCIL) 58.6 % powder Take 1 packet by mouth 2 (two) times daily. 1 TBS Twice Daily.     . Sildenafil Citrate (VIAGRA [name]) Take by mouth. Reported on 11/12/2015     No current facility-administered medications on file prior to visit.    Review of Systems  Constitutional: Negative for fever and chills.  HENT: Positive for ear pain. Negative for congestion, ear discharge, sinus pressure and sore throat.   Respiratory: Negative for cough.       Objective:    BP 140/80 mmHg  Pulse 70  Temp(Src) 98 F (36.7 C) (Oral)  Resp 16  Ht 5' 7.25" (1.708 m)  Wt 162 lb (73.483 kg)  BMI 25.19 kg/m2  SpO2 97% Nursing note and vital signs reviewed.  Physical Exam  Constitutional: He is oriented to person, place, and time. He appears well-developed and well-nourished. No distress.  HENT:  Right Ear: Hearing, tympanic membrane, external ear and ear canal normal.  Left Ear: Hearing, tympanic membrane, external ear and ear canal normal. No drainage, swelling or tenderness. No decreased hearing is noted.  Appears mild amount of fluid behind tympanic membrane with dispersed light reflex.   Cardiovascular: Normal rate, regular rhythm, normal heart sounds and intact distal pulses.   Pulmonary/Chest: Effort normal and breath sounds normal.  Neurological: He is alert and oriented to person, place, and time.  Skin: Skin is warm and dry.  Psychiatric: He has [name] normal mood and affect. His behavior is normal. Judgment and thought content normal.  Assessment & Plan:   Problem List Items Addressed This Visit      Nervous and Auditory   Eustachian tube dysfunction - Primary    Symptoms and exam consistent with eustachian tube dysfunction with no evidence of infection. Treat conservatively with OTC medications for congestion and start Singulair. Follow up if symptoms worsen or do not improve.       Relevant Medications   montelukast (SINGULAIR) 10 MG tablet       I have discontinued William [name]'s azithromycin. I am also having him start on montelukast. Additionally, I am having him  maintain his ferrous sulfate, potassium citrate, multivitamin, psyllium, Sildenafil Citrate (VIAGRA [name]), aspirin, omeprazole, and losartan.   Meds ordered this encounter  Medications  . montelukast (SINGULAIR) 10 MG tablet    Sig: Take 1 tablet (10 mg total) by mouth at bedtime.    Dispense:  30 tablet    Refill:  3    Order Specific Question:  Supervising Provider    Answer:  William [name] [[id number]]     Follow-up: Return if symptoms worsen or fail to improve.  William Po, FNP

## 2016-02-06 NOTE — Assessment & Plan Note (Signed)
Symptoms and exam consistent with eustachian tube dysfunction with no evidence of infection. Treat conservatively with OTC medications for congestion and start Singulair. Follow up if symptoms worsen or do not improve.

## 2016-02-06 NOTE — Progress Notes (Signed)
Pre visit review using our clinic review tool, if applicable. No additional management support is needed unless otherwise documented below in the visit note. 

## 2016-02-06 NOTE — Patient Instructions (Addendum)
Thank you for choosing [facility].  Summary/Instructions:  Please continue to take your medications as prescribed.   Your prescription(s) have been submitted to your pharmacy or been printed and provided for you. Please take as directed and contact our office if you believe you are having problem(s) with the medication(s) or have any questions.  If your symptoms worsen or fail to improve, please contact our office for further instruction, or in case of emergency go directly to the emergency room at the closest medical facility.   Barotitis Media Barotitis media is inflammation of your middle ear. This occurs when the auditory tube (eustachian tube) leading from the back of your nose (nasopharynx) to your eardrum is blocked. This blockage may result from a cold, environmental allergies, or an upper respiratory infection. Unresolved barotitis media may lead to damage or hearing loss (barotrauma), which may become permanent. HOME CARE INSTRUCTIONS  1. Use medicines as recommended by your health care provider. Over-the-counter medicines will help unblock the canal and can help during times of air travel. 2. Do not put anything into your ears to clean or unplug them. Eardrops will not be helpful. 3. Do not swim, dive, or fly until your health care provider says it is all right to do so. If these activities are necessary, chewing gum with frequent, forceful swallowing may help. It is also helpful to hold your nose and gently blow to pop your ears for equalizing pressure changes. This forces air into the eustachian tube. 4. Only take over-the-counter or prescription medicines for pain, discomfort, or fever as directed by your health care provider. 5. A decongestant may be helpful in decongesting the middle ear and make pressure equalization easier. SEEK MEDICAL CARE IF: 1. You experience a serious form of dizziness in which you feel as if the room is spinning and you feel nauseated  (vertigo). 2. Your symptoms only involve one ear. SEEK IMMEDIATE MEDICAL CARE IF:  1. You develop a severe headache, dizziness, or severe ear pain. 2. You have bloody or pus-like drainage from your ears. 3. You develop a fever. 4. Your problems do not improve or become worse. MAKE SURE YOU:  1. Understand these instructions. 2. Will watch your condition. 3. Will get help right away if you are not doing well or get worse.   This information is not intended to replace advice given to you by your health care provider. Make sure you discuss any questions you have with your health care provider.   Document Released: 08/27/2000 Document Revised: 06/20/2013 Document Reviewed: 03/27/2013 Elsevier Interactive Patient Education 2016 [employer]  General Recommendations:    Please drink plenty of fluids.  Get plenty of rest   Sleep in humidified air  Use saline nasal sprays  Netti pot   OTC Medications:  Decongestants - helps relieve congestion   Flonase (generic fluticasone) or Nasacort (generic triamcinolone) - please make sure to use the "cross-over" technique at a 45 degree angle towards the opposite eye as opposed to straight up the nasal passageway.   Sudafed (generic pseudoephedrine - Note this is the one that is available behind the pharmacy counter); Products with phenylephrine (-PE) may also be used but is often not as effective as pseudoephedrine.   If you have HIGH BLOOD PRESSURE - Coricidin HBP; AVOID any product that is -D as this contains pseudoephedrine which may increase your blood pressure.  Afrin (oxymetazoline) every 6-8 hours for up to 3 days.   Allergies - helps relieve runny nose, itchy  eyes and sneezing   Claritin (generic loratidine), Allegra (fexofenidine), or Zyrtec (generic cyrterizine) for runny nose. These medications should not cause drowsiness.  Note - Benadryl (generic diphenhydramine) may be used however may cause drowsiness  Cough  -   Delsym or Robitussin (generic dextromethorphan)  Expectorants - helps loosen mucus to ease removal   Mucinex (generic guaifenesin) as directed on the package.  Headaches / General Aches   Tylenol (generic acetaminophen) - DO NOT EXCEED 3 grams (3,000 mg) in a 24 hour time period  Advil/Motrin (generic ibuprofen)   Sore Throat -   Salt water gargle   Chloraseptic (generic benzocaine) spray or lozenges / Sucrets (generic dyclonine)

## 2016-02-26 ENCOUNTER — Encounter: Payer: Self-pay | Admitting: Neurology

## 2016-02-26 ENCOUNTER — Ambulatory Visit (INDEPENDENT_AMBULATORY_CARE_PROVIDER_SITE_OTHER): Payer: Managed Care, Other (non HMO) | Admitting: Neurology

## 2016-02-26 VITALS — BP 130/70 | HR 72 | Ht 67.5 in | Wt 160.0 lb

## 2016-02-26 DIAGNOSIS — Z789 Other specified health status: Secondary | ICD-10-CM | POA: Diagnosis not present

## 2016-02-26 DIAGNOSIS — G20 Parkinson's disease: Secondary | ICD-10-CM | POA: Diagnosis not present

## 2016-02-26 DIAGNOSIS — Z7289 Other problems related to lifestyle: Secondary | ICD-10-CM

## 2016-02-26 NOTE — Progress Notes (Signed)
William [name] was seen today in the movement disorders clinic for neurologic consultation at the request of Walker Kehr, MD.  The consultation is for the evaluation of tremor and gait instability.  Tremor is in both hands, but it is more in the R hand.  Pt states that he thought it was going on for 3 months but when he talked to his brother, he said he noted jaw tremor last summer.  He notes it at rest.  He also noted some shuffling.     02/26/16 update:  The patient follows up today regarding his new diagnosis of Parkinson's disease.  His wife accompanies him today and supplements the history.  He admits that there was an "emotional adjustment" to make over the diagnosis.  He states that the "jittery feeling" goes "up and down."  He is currently on no medications.  He has been exercising a "bunch".  Doing 30 min of cardio on home treadmill 4 days a week and does some weights 2 days a week.   Last visit, I talked to him about decreasing his alcohol content.  I checked lab work and his sodium was 128.  I asked him to follow-up with his primary care provider.  He did see his nurse practitioner and she felt that this was just chronic hyponatremia.  No falls but occasionally has "balance uncertainty", lightheadedness/near syncope, hallucinations since last visit.  Mood has been more irritable and wife states that she has noted a "real change."   Drinking 1-2 glasses of wine/night and sometimes will have "cocktail" as well.     ALLERGIES:  No Known Allergies  CURRENT MEDICATIONS:  Outpatient Encounter Prescriptions as of 02/26/2016  Medication Sig  . aspirin (ASPIRIN CHILDRENS) 81 MG chewable tablet Chew 1 tablet (81 mg total) by mouth daily.  . ferrous sulfate 325 (65 FE) MG tablet Take 325 mg by mouth daily with breakfast.    . losartan (COZAAR) 100 MG tablet Take 1 tablet (100 mg total) by mouth daily.  . Multiple Vitamin (MULTIVITAMIN) tablet Take 1 tablet by mouth daily. Reported on 11/19/2015    . omeprazole (PRILOSEC) 40 MG capsule Take 1 capsule (40 mg total) by mouth 2 (two) times daily.  . potassium citrate (UROCIT-K 10) 10 MEQ (1080 MG) SR tablet Take 10 mEq by mouth 2 (two) times daily.   . psyllium (METAMUCIL) 58.6 % powder Take 1 packet by mouth 2 (two) times daily. 1 TBS Twice Daily.  . Sildenafil Citrate (VIAGRA PO) Take by mouth. Reported on 11/12/2015  . [DISCONTINUED] montelukast (SINGULAIR) 10 MG tablet Take 1 tablet (10 mg total) by mouth at bedtime.   No facility-administered encounter medications on file as of 02/26/2016.    PAST MEDICAL HISTORY:   Past Medical History  Diagnosis Date  . Allergic rhinitis   . Anemia, iron deficiency   . Ulcerative colitis     status post total abdominal colectomy with ileoanal anastomosis  . Gastric polyps   . ED (erectile dysfunction)   . GERD (gastroesophageal reflux disease)   . Esophageal stricture     Status post upper endoscopy with esophageal dilatation  . Kidney stone     x 2  . Inguinal hernia   . Hiatal hernia   . Arthritis   . Blood transfusion without reported diagnosis   . Hypertension   . Colonic stricture ([place])   . Parkinson disease ([place])     PAST SURGICAL HISTORY:   Past Surgical History  Procedure Laterality Date  . Inguinal hernia repair    . Colectomy  1992    with ilial anal pull through (for ulcerative colitis)  . Ethmoidectomy      and maxillary enterostomies '04, redo in 12/04  . Knee arthroscopy Right 2004  . Kidney stone surgery      removed 2013  . Lasik      SOCIAL HISTORY:   Social History   Social History  . Marital Status: Married    Spouse Name: N/A  . Number of Children: N/A  . Years of Education: N/A   Occupational History  . Mortgage Customer service manager    Social History Main Topics  . Smoking status: Former Smoker    Quit date: 09/14/1979  . Smokeless tobacco: Never Used  . Alcohol Use: 0.0 oz/week    0 Standard drinks or equivalent per week     Comment: 1 glass wine  nightly during week, 2-3 drinks a day on weekends  . Drug Use: No  . Sexual Activity: Yes   Other Topics Concern  . Not on file   Social History Narrative   [employer] grad   Married 13.5 years- divorced; married '05   No children   Work: Insurance underwriter   Patient is a former smoker. Quit in 1981   Alcohol use- yes social   Daily Caffeine use 2 cups coffee per day   Illicit drug use- no    FAMILY HISTORY:   Family Status  Relation Status Death Age  . Father Deceased     rectal cancer, multiple myeloma  . Mother Deceased     dementia, HTN  . Brother Alive     heart disease (identical)  . Brother Alive     sinus issues    ROS:  A complete 10 system review of systems was obtained and was unremarkable apart from what is mentioned above.  PHYSICAL EXAMINATION:    VITALS:   Filed Vitals:   02/26/16 0838  BP: 98/60  Pulse: 72  Height: 5' 7.5" (1.715 m)  Weight: 160 lb (72.576 kg)    GEN:  The patient appears stated age and is in NAD. HEENT:  Normocephalic, atraumatic.  The mucous membranes are moist. The superficial temporal arteries are without ropiness or tenderness. CV:  RRR Lungs:  CTAB Neck/HEME:  There are no carotid bruits bilaterally.  Neurological examination:  Orientation: The patient is alert and oriented x3. Fund of knowledge is appropriate.  Recent and remote memory are intact.  Attention and concentration are normal.    Able to name objects and repeat phrases. Cranial nerves: There is good facial symmetry. There is facial hypomimiaPupils are equal round and reactive to light bilaterally. Fundoscopic exam reveals clear margins bilaterally. Extraocular muscles are intact. The visual fields are full to confrontational testing. The speech is fluent and clear. Soft palate rises symmetrically and there is no tongue deviation. Hearing is intact to conversational tone. Sensation: Sensation is intact to light and pinprick throughout (facial, trunk, extremities).  Vibration is intact at the bilateral big toe. There is no extinction with double simultaneous stimulation. There is no sensory dermatomal level identified. Motor: Strength is 5/5 in the bilateral upper and lower extremities.   Shoulder shrug is equal and symmetric.  There is no pronator drift.   Movement examination: Tone: There is mild increased tone in the right UE only with activation procedures. Abnormal movements: There is an intermittent, non rhythmic, jerking RUE resting tremor that does not increase  significantly with distraction procedures.  Similar tremor can be felt in the L hand but seen less.  No postural tremor.  Min trouble with archimedes spirals.  No trouble pouring water from one glass to another.   Coordination:  There is decremation with RAM's, seen esp with alternation supination/pronation of the forearm on the right but also heel taps on the L.  Has some decremation with finger taps bilaterally and with "turning in light bulb" bilaterally. Gait and Station: The patient has no difficulty arising out of a deep-seated chair without the use of the hands. The patient's stride length is good with good arm swing and rarely do the feet scuffle the ground.  The patient has a negative pull test.      LABS  Lab Results  Component Value Date   TSH 1.40 11/19/2015     Chemistry      Component Value Date/Time   NA 129* 01/16/2016 1251   K 4.4 01/16/2016 1251   CL 96 01/16/2016 1251   CO2 24 01/16/2016 1251   BUN 20 01/16/2016 1251   CREATININE 0.98 01/16/2016 1251      Component Value Date/Time   CALCIUM 9.5 01/16/2016 1251   ALKPHOS 33* 11/19/2015 1037   AST 22 11/19/2015 1037   ALT 30 11/19/2015 1037   BILITOT 0.9 11/19/2015 1037       ASSESSMENT/PLAN:  1. Idiopathic Parkinson's disease.  The patient just barely meets [place] brain bank criteria but he does meet it.  -We discussed the diagnosis as well as pathophysiology of the disease again as wife not here last visit.   We discussed treatment options as well as prognostic indicators.  Patient education was provided.  -reviewed medication therapies and pt decided to hold on those for now.  Did tell him it may help inner sense of "jitteriness."  -asked me about THC and gave him AAN position on this, and this is not recommended. 2.  EtOH overuse  -he and I discussed again that he needs to decrease EtOH intake.  Discussed this with pt and wife today 3.  Hx HTN  -BP was low today.  Talked to him about the fact that many PD pts will ultimately get off of BP meds.  He is asymptomatic and we will follow this. 3.  Follow up is anticipated in the next 4-68month, sooner should new neurologic issues arise.  Much greater than 50% of this visit was spent in counseling and coordinating care.  Total face to face time:  30

## 2016-04-26 ENCOUNTER — Ambulatory Visit: Payer: Managed Care, Other (non HMO) | Admitting: Podiatry

## 2016-05-08 ENCOUNTER — Other Ambulatory Visit: Payer: Self-pay | Admitting: Family

## 2016-05-08 DIAGNOSIS — R030 Elevated blood-pressure reading, without diagnosis of hypertension: Principal | ICD-10-CM

## 2016-05-08 DIAGNOSIS — IMO0001 Reserved for inherently not codable concepts without codable children: Secondary | ICD-10-CM

## 2016-06-25 NOTE — Progress Notes (Signed)
William [name] was seen today in the movement disorders clinic for neurologic consultation at the request of Walker Kehr, MD.  The consultation is for the evaluation of tremor and gait instability.  Tremor is in both hands, but it is more in the R hand.  Pt states that he thought it was going on for 3 months but when he talked to his brother, he said he noted jaw tremor last summer.  He notes it at rest.  He also noted some shuffling.     02/26/16 update:  The patient follows up today regarding his new diagnosis of Parkinson's disease.  His wife accompanies him today and supplements the history.  He admits that there was an "emotional adjustment" to make over the diagnosis.  He states that the "jittery feeling" goes "up and down."  He is currently on no medications.  He has been exercising a "bunch".  Doing 30 min of cardio on home treadmill 4 days a week and does some weights 2 days a week.   Last visit, I talked to him about decreasing his alcohol content.  I checked lab work and his sodium was 128.  I asked him to follow-up with his primary care provider.  He did see his nurse practitioner and she felt that this was just chronic hyponatremia.  No falls but occasionally has "balance uncertainty", lightheadedness/near syncope, hallucinations since last visit.  Mood has been more irritable and wife states that she has noted a "real change."   Drinking 1-2 glasses of wine/night and sometimes will have "cocktail" as well.    06/28/16 update:  The patient follows up today, accompanied by his wife who supplements the history.  He is on no medication for this, as he has not wanted to be.  He has had no falls since last visit.  No lightheadedness or near syncope. He is doing cardio 5 days a week and feels "zapped" for the rest of the day.  Has had a little bit more tremor because of more stress.   In regards to alcohol, the patient states that he is drinking about the same, maybe a little less scotch.      ALLERGIES:  No Known Allergies  CURRENT MEDICATIONS:  Outpatient Encounter Prescriptions as of 06/28/2016  Medication Sig  . aspirin (ASPIRIN CHILDRENS) 81 MG chewable tablet Chew 1 tablet (81 mg total) by mouth daily.  . ferrous sulfate 325 (65 FE) MG tablet Take 325 mg by mouth daily with breakfast.    . losartan (COZAAR) 100 MG tablet take 1 tablet by mouth once daily  . omeprazole (PRILOSEC) 40 MG capsule Take 1 capsule (40 mg total) by mouth 2 (two) times daily.  . potassium citrate (UROCIT-K 10) 10 MEQ (1080 MG) SR tablet Take 10 mEq by mouth 2 (two) times daily.   . psyllium (METAMUCIL) 58.6 % powder Take 1 packet by mouth 2 (two) times daily. 1 TBS Twice Daily.  . Sildenafil Citrate (VIAGRA PO) Take by mouth. Reported on 11/12/2015  . [DISCONTINUED] Multiple Vitamin (MULTIVITAMIN) tablet Take 1 tablet by mouth daily. Reported on 11/19/2015   No facility-administered encounter medications on file as of 06/28/2016.     PAST MEDICAL HISTORY:   Past Medical History:  Diagnosis Date  . Allergic rhinitis   . Anemia, iron deficiency   . Arthritis   . Blood transfusion without reported diagnosis   . Colonic stricture   . ED (erectile dysfunction)   . Esophageal stricture  Status post upper endoscopy with esophageal dilatation  . Gastric polyps   . GERD (gastroesophageal reflux disease)   . Hiatal hernia   . Hypertension   . Inguinal hernia   . Kidney stone    x 2  . Parkinson disease ([place])   . Ulcerative colitis    status post total abdominal colectomy with ileoanal anastomosis    PAST SURGICAL HISTORY:   Past Surgical History:  Procedure Laterality Date  . COLECTOMY  1992   with ilial anal pull through (for ulcerative colitis)  . ETHMOIDECTOMY     and maxillary enterostomies '04, redo in 12/04  . INGUINAL HERNIA REPAIR    . KIDNEY STONE SURGERY     removed 2013  . KNEE ARTHROSCOPY Right 2004  . LASIK      SOCIAL HISTORY:   Social History   Social History   . Marital status: Married    Spouse name: N/A  . Number of children: N/A  . Years of education: N/A   Occupational History  . 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 Smoking status: Former Smoker    Quit date: 09/14/1979  . Smokeless tobacco: Never Used  . Alcohol use 0.0 oz/week     Comment: 1 glass wine nightly during week, 2-3 drinks a day on weekends  . Drug use: No  . Sexual activity: Yes   Other Topics Concern  . Not on file   Social History Narrative   [employer] grad   Married 13.5 years- divorced; married '05   No children   Work: Insurance underwriter   Patient is a former smoker. Quit in 1981   Alcohol use- yes social   Daily Caffeine use 2 cups coffee per day   Illicit drug use- no    FAMILY HISTORY:   Family Status  Relation Status  . Father Deceased   rectal cancer, multiple myeloma  . Mother Deceased   dementia, HTN  . Brother Alive   heart disease (identical)  . Brother Alive   sinus issues  . Neg Hx     ROS:  A complete 10 system review of systems was obtained and was unremarkable apart from what is mentioned above.  PHYSICAL EXAMINATION:    VITALS:   Vitals:   06/28/16 0840  BP: 120/82  Pulse: 62  Weight: 163 lb (73.9 kg)  Height: 5' 7"  (1.702 m)    GEN:  The patient appears stated age and is in NAD. HEENT:  Normocephalic, atraumatic.  The mucous membranes are moist. The superficial temporal arteries are without ropiness or tenderness. CV:  RRR Lungs:  CTAB Neck/HEME:  There are no carotid bruits bilaterally.  Neurological examination:  Orientation: The patient is alert and oriented x3. Fund of knowledge is appropriate.  Recent and remote memory are intact.  Attention and concentration are normal.    Able to name objects and repeat phrases. Cranial nerves: There is good facial symmetry. There is facial hypomimiaPupils are equal round and reactive to light bilaterally. Fundoscopic exam reveals clear margins  bilaterally. Extraocular muscles are intact. The visual fields are full to confrontational testing. The speech is fluent and clear. Soft palate rises symmetrically and there is no tongue deviation. Hearing is intact to conversational tone. Sensation: Sensation is intact to light and pinprick throughout (facial, trunk, extremities). Vibration is intact at the bilateral big toe. There is no extinction with double simultaneous stimulation. There is no sensory dermatomal level identified. Motor: Strength  is 5/5 in the bilateral upper and lower extremities.   Shoulder shrug is equal and symmetric.  There is no pronator drift.   Movement examination: Tone: There is mild increased tone in the right UE Abnormal movements: There is an intermittent, non rhythmic, jerking RUE resting tremor that does not increase significantly with distraction procedures.  Similar tremor can be felt in the L hand but seen less.  No postural tremor. Coordination:  There is decremation with all RAM's on the right, including alternating supination and pronation of the forearm, hand opening and closing, finger taps, heel taps and toe taps and turning in light bulb. Gait and Station: The patient has no difficulty arising out of a deep-seated chair without the use of the hands. The patient's stride length is good with good arm swing and rarely do the feet scuffle the ground.  The patient has a negative pull test.      LABS  Lab Results  Component Value Date   TSH 1.40 11/19/2015     Chemistry      Component Value Date/Time   NA 129 (L) 01/16/2016 1251   K 4.4 01/16/2016 1251   CL 96 01/16/2016 1251   CO2 24 01/16/2016 1251   BUN 20 01/16/2016 1251   CREATININE 0.98 01/16/2016 1251      Component Value Date/Time   CALCIUM 9.5 01/16/2016 1251   ALKPHOS 33 (L) 11/19/2015 1037   AST 22 11/19/2015 1037   ALT 30 11/19/2015 1037   BILITOT 0.9 11/19/2015 1037       ASSESSMENT/PLAN:  1. Idiopathic Parkinson's disease.   The patient just barely meets [place] brain bank criteria but he does meet it.  -We discussed the diagnosis as well as pathophysiology of the disease again as wife not here last visit.  We discussed treatment options as well as prognostic indicators.  Patient education was provided.  -start mirapex and work to 1.5 mg daily.  Talked about r/b/se, including but not limited to sleep attacks and compulsive behaviors.   2.  EtOH overuse  -he and I discussed again that he needs to decrease EtOH intake.  Discussed this with pt and wife today 3.  Hx HTN  -need to monitor as is on BP medication and PD itself may lower 3.  Follow up is anticipated in the next 26month, sooner should new neurologic issues arise.  Much greater than 50% of this visit was spent in counseling and coordinating care.  Total face to face time:  30

## 2016-06-28 ENCOUNTER — Ambulatory Visit (INDEPENDENT_AMBULATORY_CARE_PROVIDER_SITE_OTHER): Payer: Managed Care, Other (non HMO) | Admitting: Neurology

## 2016-06-28 ENCOUNTER — Encounter: Payer: Self-pay | Admitting: Neurology

## 2016-06-28 VITALS — BP 120/82 | HR 62 | Ht 67.0 in | Wt 163.0 lb

## 2016-06-28 DIAGNOSIS — G20 Parkinson's disease: Secondary | ICD-10-CM | POA: Diagnosis not present

## 2016-06-28 DIAGNOSIS — Z789 Other specified health status: Secondary | ICD-10-CM

## 2016-06-28 DIAGNOSIS — Z7289 Other problems related to lifestyle: Secondary | ICD-10-CM

## 2016-06-28 MED ORDER — PRAMIPEXOLE DIHYDROCHLORIDE ER 1.5 MG PO TB24: 1.0000 | ORAL_TABLET | Freq: Every day | ORAL | 2 refills | Status: DC

## 2016-06-28 MED ORDER — PRAMIPEXOLE DIHYDROCHLORIDE 0.125 MG PO TABS: ORAL_TABLET | ORAL | 0 refills | Status: DC

## 2016-06-28 NOTE — Patient Instructions (Signed)
1. Start mirapex (pramipexole) as follows:  0.125 mg - 1 tablet three times per day for a week, then 2 tablets three times per day for a week and then fill the Mirapex 1.5 mg tablet and take that, 1 pill once daily. Both prescriptions sent to your pharmacy.

## 2016-07-04 ENCOUNTER — Other Ambulatory Visit: Payer: Self-pay | Admitting: Internal Medicine

## 2016-07-16 ENCOUNTER — Telehealth: Payer: Self-pay | Admitting: Neurology

## 2016-07-16 NOTE — Telephone Encounter (Signed)
Patient made aware. Follow up appt made.

## 2016-07-16 NOTE — Telephone Encounter (Signed)
William [name] 2051-05-02. His # [phone]. The Medication Pramipexole he is taking is giving him some concerns. He has had some side effects from the medication and would like to speak with you. Thank you

## 2016-07-16 NOTE — Telephone Encounter (Signed)
Left message on machine for patient to call back.

## 2016-07-16 NOTE — Telephone Encounter (Signed)
Make a f/u appt.  Can d/c the medication until then.  We can look at other med options.  As for the near passing out, he and I talked extensively about his EtOH use and I do not think that is related to the medication.  Needs to wean off of EtOH all together.

## 2016-07-16 NOTE — Telephone Encounter (Signed)
Spoke with patient and he states he is not doing well on Mirapex.   He has lost 5 lbs since starting (and he states he had to work really hard to lose 10 lbs over months in the past.)  He is describing what sound like sleep attacks since starting the Mirapex 1.5 mg once daily. He states he just "can't hold his head up". He states it was a struggle to even make it from the couch to his bedroom to go to sleep.   He did have an incident where he thought he was going to pass out- he had a cocktail after taking medication and called the pharmacist and was made aware he should not drink while on medication.   He states he would rather take no drugs because he has gotten only minor improvements in tremor and his symptoms were not severe to begin with.   Please advise.

## 2016-07-26 ENCOUNTER — Encounter: Payer: Self-pay | Admitting: Internal Medicine

## 2016-07-26 ENCOUNTER — Ambulatory Visit (INDEPENDENT_AMBULATORY_CARE_PROVIDER_SITE_OTHER): Payer: Medicare HMO | Admitting: Internal Medicine

## 2016-07-26 VITALS — BP 124/80 | HR 74 | Temp 98.6°F | Resp 16 | Ht 67.0 in | Wt 157.0 lb

## 2016-07-26 DIAGNOSIS — I10 Essential (primary) hypertension: Secondary | ICD-10-CM | POA: Diagnosis not present

## 2016-07-26 DIAGNOSIS — B9789 Other viral agents as the cause of diseases classified elsewhere: Secondary | ICD-10-CM

## 2016-07-26 DIAGNOSIS — J069 Acute upper respiratory infection, unspecified: Secondary | ICD-10-CM

## 2016-07-26 NOTE — Progress Notes (Signed)
Pre visit review using our clinic review tool, if applicable. No additional management support is needed unless otherwise documented below in the visit note. 

## 2016-07-26 NOTE — Patient Instructions (Signed)
Acute bronchitis symptoms for less than 10 days are generally not helped by antibiotics.  Take over-the-counter expectorants and cough medications such as  Mucinex DM.  Call if there is no improvement in 5 to 7 days or if  you develop worsening cough, fever, or new symptoms, such as shortness of breath or chest pain.

## 2016-07-26 NOTE — Progress Notes (Signed)
Subjective:    Patient ID: William [name], male    DOB: 03-06-1951, 65 y.o.   MRN: [id number]  HPI  65 year old patient who presents with a four-day history of cough, congestion.  Denies any fever.  Cough is mildly productive of some light green sputum. Cough is his chief complaint which shows seems to be improving.  His wife is also ill with a similar presentation Denies any chest pain, shortness of breath or wheezing Former smoker but discontinued 1981  Past Medical History:  Diagnosis Date  . Allergic rhinitis   . Anemia, iron deficiency   . Arthritis   . Blood transfusion without reported diagnosis   . Colonic stricture   . ED (erectile dysfunction)   . Esophageal stricture    Status post upper endoscopy with esophageal dilatation  . Gastric polyps   . GERD (gastroesophageal reflux disease)   . Hiatal hernia   . Hypertension   . Inguinal hernia   . Kidney stone    x 2  . Parkinson disease ([place])   . Ulcerative colitis    status post total abdominal colectomy with ileoanal anastomosis     Social History   Social History  . Marital status: Married    Spouse name: N/A  . Number of children: N/A  . Years of education: N/A   Occupational History  . 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 Smoking status: Former Smoker    Quit date: 09/14/1979  . Smokeless tobacco: Never Used  . Alcohol use 0.0 oz/week     Comment: 1 glass wine nightly during week, 2-3 drinks a day on weekends  . Drug use: No  . Sexual activity: Yes   Other Topics Concern  . Not on file   Social History Narrative   [employer] [employer] grad   Married 13.5 years- divorced; married '05   No children   Work: Insurance underwriter   Patient is a former smoker. Quit in 1981   Alcohol use- yes social   Daily Caffeine use 2 cups coffee per day   Illicit drug use- no    Past Surgical History:  Procedure Laterality Date  . COLECTOMY  1992   with ilial anal pull through (for ulcerative  colitis)  . ETHMOIDECTOMY     and maxillary enterostomies '04, redo in 12/04  . INGUINAL HERNIA REPAIR    . KIDNEY STONE SURGERY     removed 2013  . KNEE ARTHROSCOPY Right 2004  . LASIK      Family History  Problem Relation Age of Onset  . Cancer Father     rectal cancer and MM  . Rectal cancer Father   . Dementia Mother   . Hypertension Mother   . Heart disease Brother     valve repaired at 62  . Colon cancer Neg Hx   . Esophageal cancer Neg Hx   . Stomach cancer Neg Hx     No Known Allergies  Current Outpatient Prescriptions on File Prior to Visit  Medication Sig Dispense Refill  . aspirin (ASPIRIN CHILDRENS) 81 MG chewable tablet Chew 1 tablet (81 mg total) by mouth daily. 36 tablet 11  . ferrous sulfate 325 (65 FE) MG tablet Take 325 mg by mouth daily with breakfast.      . losartan (COZAAR) 100 MG tablet take 1 tablet by mouth once daily 30 tablet 3  . omeprazole (PRILOSEC) 40 MG capsule take 1 capsule by mouth twice a  day 60 capsule 6  . potassium citrate (UROCIT-K 10) 10 MEQ (1080 MG) SR tablet Take 10 mEq by mouth 2 (two) times daily.     . psyllium (METAMUCIL) 58.6 % powder Take 1 packet by mouth 2 (two) times daily. 1 TBS Twice Daily.    . Sildenafil Citrate (VIAGRA PO) Take by mouth. Reported on 11/12/2015     No current facility-administered medications on file prior to visit.     BP 124/80 (BP Location: Left Arm, Patient Position: Sitting, Cuff Size: Normal)   Pulse 74   Temp 98.6 F (37 C) (Oral)   Resp 16   Ht 5' 7"  (1.702 m)   Wt 157 lb (71.2 kg)   SpO2 99%   BMI 24.59 kg/m     Review of Systems  Constitutional: Positive for fatigue. Negative for appetite change, chills and fever.  HENT: Positive for congestion and sinus pressure. Negative for dental problem, ear pain, hearing loss, sore throat, tinnitus, trouble swallowing and voice change.   Eyes: Negative for pain, discharge and visual disturbance.  Respiratory: Positive for cough. Negative for  chest tightness, wheezing and stridor.   Cardiovascular: Negative for chest pain, palpitations and leg swelling.  Gastrointestinal: Negative for abdominal distention, abdominal pain, blood in stool, constipation, diarrhea, nausea and vomiting.  Genitourinary: Negative for difficulty urinating, discharge, flank pain, genital sores, hematuria and urgency.  Musculoskeletal: Negative for arthralgias, back pain, gait problem, joint swelling, myalgias and neck stiffness.  Skin: Negative for rash.  Neurological: Negative for dizziness, syncope, speech difficulty, weakness, numbness and headaches.  Hematological: Negative for adenopathy. Does not bruise/bleed easily.  Psychiatric/Behavioral: Negative for behavioral problems and dysphoric mood. The patient is not nervous/anxious.        Objective:   Physical Exam  Constitutional: He is oriented to person, place, and time. He appears well-developed.  HENT:  Head: Normocephalic.  Right Ear: External ear normal.  Left Ear: External ear normal.  Eyes: Conjunctivae and EOM are normal.  Neck: Normal range of motion.  Cardiovascular: Normal rate and normal heart sounds.   Pulmonary/Chest: Effort normal and breath sounds normal. No respiratory distress. He has no wheezes. He has no rales.  Abdominal: Bowel sounds are normal.  Musculoskeletal: Normal range of motion. He exhibits no edema or tenderness.  Neurological: He is alert and oriented to person, place, and time.  Psychiatric: He has a normal mood and affect. His behavior is normal.     Viral URI with cough.  Will continue symptomatic treatment Essential hypertension.  Well-controlled.  No change in therapy PD.  Follow-up neurology  Will report any new or worsening symptoms  Nyoka Cowden

## 2016-07-30 NOTE — Progress Notes (Signed)
William [name] was seen today in the movement disorders clinic for neurologic consultation at the request of Walker Kehr, MD.  The consultation is for the evaluation of tremor and gait instability.  Tremor is in both hands, but it is more in the R hand.  Pt states that he thought it was going on for 3 months but when he talked to his brother, he said he noted jaw tremor last summer.  He notes it at rest.  He also noted some shuffling.     02/26/16 update:  The patient follows up today regarding his new diagnosis of Parkinson's disease.  His wife accompanies him today and supplements the history.  He admits that there was an "emotional adjustment" to make over the diagnosis.  He states that the "jittery feeling" goes "up and down."  He is currently on no medications.  He has been exercising a "bunch".  Doing 30 min of cardio on home treadmill 4 days a week and does some weights 2 days a week.   Last visit, I talked to him about decreasing his alcohol content.  I checked lab work and his sodium was 128.  I asked him to follow-up with his primary care provider.  He did see his nurse practitioner and she felt that this was just chronic hyponatremia.  No falls but occasionally has "balance uncertainty", lightheadedness/near syncope, hallucinations since last visit.  Mood has been more irritable and wife states that she has noted a "real change."   Drinking 1-2 glasses of wine/night and sometimes will have "cocktail" as well.    06/28/16 update:  The patient follows up today, accompanied by his wife who supplements the history.  He is on no medication for this, as he has not wanted to be.  He has had no falls since last visit.  No lightheadedness or near syncope. He is doing cardio 5 days a week and feels "zapped" for the rest of the day.  Has had a little bit more tremor because of more stress.   In regards to alcohol, the patient states that he is drinking about the same, maybe a little less scotch.     07/30/16 update:  Pt f/u today.  He called his wife and she was in on the conference call.  I started him on Mirapex ER, and worked to 1.5 mg daily.  He called me and reported side effects with the medication.  He reported that he had a near passing out episode.  This occurred after drinking alcohol and his wife and friend had to help him out of the restaurant.  He called his pharmacist, who told him he should not be drinking alcohol with the medication.  We have discussed his alcohol intake several times in the past.  Ultimately, the patient ended up discontinuing his Mirapex ER.  States that it caused weight loss and diarrhea.  He does have a hx of UC and has a hx of diarrhea but not that often.  Pt concerned about low "BP."  Wife complains that he generally doesn't feel good.    PREVIOUS MEDS:  mirapex (states that he had diarrhea but does have hx of UC and diarrhea isn't unusual for him)   ALLERGIES:  No Known Allergies  CURRENT MEDICATIONS:  Outpatient Encounter Prescriptions as of 08/02/2016  Medication Sig  . aspirin (ASPIRIN CHILDRENS) 81 MG chewable tablet Chew 1 tablet (81 mg total) by mouth daily.  . ferrous sulfate 325 (65 FE) MG tablet  Take 325 mg by mouth daily with breakfast.    . losartan (COZAAR) 100 MG tablet take 1 tablet by mouth once daily  . omeprazole (PRILOSEC) 40 MG capsule take 1 capsule by mouth twice a day  . potassium citrate (UROCIT-K 10) 10 MEQ (1080 MG) SR tablet Take 10 mEq by mouth 2 (two) times daily.   . psyllium (METAMUCIL) 58.6 % powder Take 1 packet by mouth 2 (two) times daily. 1 TBS Twice Daily.  . Sildenafil Citrate (VIAGRA PO) Take by mouth. Reported on 11/12/2015   No facility-administered encounter medications on file as of 08/02/2016.     PAST MEDICAL HISTORY:   Past Medical History:  Diagnosis Date  . Allergic rhinitis   . Anemia, iron deficiency   . Arthritis   . Blood transfusion without reported diagnosis   . Colonic stricture   . ED  (erectile dysfunction)   . Esophageal stricture    Status post upper endoscopy with esophageal dilatation  . Gastric polyps   . GERD (gastroesophageal reflux disease)   . Hiatal hernia   . Hypertension   . Inguinal hernia   . Kidney stone    x 2  . Parkinson disease ([place])   . Ulcerative colitis    status post total abdominal colectomy with ileoanal anastomosis    PAST SURGICAL HISTORY:   Past Surgical History:  Procedure Laterality Date  . COLECTOMY  1992   with ilial anal pull through (for ulcerative colitis)  . ETHMOIDECTOMY     and maxillary enterostomies '04, redo in 12/04  . INGUINAL HERNIA REPAIR    . KIDNEY STONE SURGERY     removed 2013  . KNEE ARTHROSCOPY Right 2004  . LASIK      SOCIAL HISTORY:   Social History   Social History  . Marital status: Married    Spouse name: N/A  . Number of children: N/A  . Years of education: N/A   Occupational History  . 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 Smoking status: Former Smoker    Quit date: 09/14/1979  . Smokeless tobacco: Never Used  . Alcohol use 0.0 oz/week     Comment: 1 glass wine nightly during week, 2-3 drinks a day on weekends  . Drug use: No  . Sexual activity: Yes   Other Topics Concern  . Not on file   Social History Narrative   [employer] [employer] grad   Married 13.5 years- divorced; married '05   No children   Work: Insurance underwriter   Patient is a former smoker. Quit in 1981   Alcohol use- yes social   Daily Caffeine use 2 cups coffee per day   Illicit drug use- no    FAMILY HISTORY:   Family Status  Relation Status  . Father Deceased   rectal cancer, multiple myeloma  . Mother Deceased   dementia, HTN  . Brother Alive   heart disease (identical)  . Brother Alive   sinus issues  . Neg Hx     ROS:  A complete 10 system review of systems was obtained and was unremarkable apart from what is mentioned above.  PHYSICAL EXAMINATION:    VITALS:   Vitals:    08/02/16 0820  BP: 100/64  Pulse: 75  Weight: 154 lb (69.9 kg)  Height: _0  (1.702 m)   Wt Readings from Last 3 Encounters:  08/02/16 154 lb (69.9 kg)  07/26/16 157 lb (71.2 kg)  06/28/16 163  lb (73.9 kg)     GEN:  The patient appears stated age and is in NAD. HEENT:  Normocephalic, atraumatic.  The mucous membranes are moist. The superficial temporal arteries are without ropiness or tenderness. CV:  RRR Lungs:  CTAB Neck/HEME:  There are no carotid bruits bilaterally.  Neurological examination:  Orientation: The patient is alert and oriented x3. Fund of knowledge is appropriate.  Recent and remote memory are intact.  Attention and concentration are normal.    Able to name objects and repeat phrases. Cranial nerves: There is good facial symmetry. There is facial hypomimiaPupils are equal round and reactive to light bilaterally. Fundoscopic exam reveals clear margins bilaterally. Extraocular muscles are intact. The visual fields are full to confrontational testing. The speech is fluent and clear. Soft palate rises symmetrically and there is no tongue deviation. Hearing is intact to conversational tone. Sensation: Sensation is intact to light and pinprick throughout (facial, trunk, extremities). Vibration is intact at the bilateral big toe. There is no extinction with double simultaneous stimulation. There is no sensory dermatomal level identified. Motor: Strength is 5/5 in the bilateral upper and lower extremities.   Shoulder shrug is equal and symmetric.  There is no pronator drift.   Movement examination: Tone: There is mild increased tone in the right UE Abnormal movements: There is an intermittent, non rhythmic, jerking RUE resting tremor that does not increase significantly with distraction procedures.  Similar tremor can be felt in the L hand but seen less.  Mild RLE tremor is noted.  No postural tremor. Coordination:  There is decremation with all RAM's on the right, including  alternating supination and pronation of the forearm, hand opening and closing, finger taps, heel taps and toe taps and turning in light bulb. Gait and Station: The patient has no difficulty arising out of a deep-seated chair without the use of the hands. The patient's stride length is good with decreased arm swing on the right and rarely do the feet scuffle the ground.  The patient has a negative pull test.      LABS  Lab Results  Component Value Date   TSH 1.40 11/19/2015     Chemistry      Component Value Date/Time   NA 129 (L) 01/16/2016 1251   K 4.4 01/16/2016 1251   CL 96 01/16/2016 1251   CO2 24 01/16/2016 1251   BUN 20 01/16/2016 1251   CREATININE 0.98 01/16/2016 1251      Component Value Date/Time   CALCIUM 9.5 01/16/2016 1251   ALKPHOS 33 (L) 11/19/2015 1037   AST 22 11/19/2015 1037   ALT 30 11/19/2015 1037   BILITOT 0.9 11/19/2015 1037     Lab Results  Component Value Date   WBC 3.7 (L) 11/19/2015   HGB 14.3 11/19/2015   HCT 41.6 11/19/2015   MCV 98.3 11/19/2015   PLT 177.0 11/19/2015   Lab Results  Component Value Date   VITAMINB12 458 11/19/2015     ASSESSMENT/PLAN:  1. Idiopathic Parkinson's disease.  The patient just barely meets [place] brain bank criteria but he does meet it.  -We discussed the diagnosis as well as pathophysiology of the disease again as wife not here last visit.  We discussed treatment options as well as prognostic indicators.  Patient education was provided.  -Not convinced that all of the reported side effects were from the pramipexole ER and he and I discussed this today.  -talked about trying neupro but decided to hold until f/u  with PCP  -will get labs today 2.  EtOH overuse  -he and I discussed again that he needs to decrease EtOH intake.  Discussed this with pt  3.  Hx HTN  -wonder if this lowering of BP is contributing to his sense of fatigue and overall poor wellbeing.  Been of mirapex for few weeks and still feels bad.  Will  make appt with Dr. Alain Marion to see if lowering losartan may be indicated 4.  Weight loss and diarrhea  -more concerned about his UC.  These would be unusual sx's for PD.  Needs to f/u with PCP 3.  Follow up is anticipated in the next 6-8 weeks, sooner should new issues arise.  Much greater than 50% of this visit was spent in counseling and coordinating care.  Total face to face time:  25 min

## 2016-08-02 ENCOUNTER — Ambulatory Visit (INDEPENDENT_AMBULATORY_CARE_PROVIDER_SITE_OTHER): Payer: Medicare HMO | Admitting: Neurology

## 2016-08-02 ENCOUNTER — Encounter: Payer: Self-pay | Admitting: Neurology

## 2016-08-02 ENCOUNTER — Other Ambulatory Visit: Payer: Medicare HMO

## 2016-08-02 VITALS — BP 100/64 | HR 75 | Ht 67.0 in | Wt 154.0 lb

## 2016-08-02 DIAGNOSIS — R634 Abnormal weight loss: Secondary | ICD-10-CM

## 2016-08-02 DIAGNOSIS — E871 Hypo-osmolality and hyponatremia: Secondary | ICD-10-CM

## 2016-08-02 DIAGNOSIS — D72819 Decreased white blood cell count, unspecified: Secondary | ICD-10-CM

## 2016-08-02 DIAGNOSIS — G20A1 Parkinson's disease without dyskinesia, without mention of fluctuations: Secondary | ICD-10-CM

## 2016-08-02 DIAGNOSIS — Z5181 Encounter for therapeutic drug level monitoring: Secondary | ICD-10-CM

## 2016-08-02 DIAGNOSIS — R5383 Other fatigue: Secondary | ICD-10-CM

## 2016-08-02 DIAGNOSIS — G20 Parkinson's disease: Secondary | ICD-10-CM

## 2016-08-02 LAB — COMPREHENSIVE METABOLIC PANEL
ALT: 19 U/L (ref 9–46)
AST: 18 U/L (ref 10–35)
Albumin: 4.1 g/dL (ref 3.6–5.1)
Alkaline Phosphatase: 48 U/L (ref 40–115)
BUN: 26 mg/dL — AB (ref 7–25)
CHLORIDE: 102 mmol/L (ref 98–110)
CO2: 22 mmol/L (ref 20–31)
Calcium: 9.1 mg/dL (ref 8.6–10.3)
Creat: 0.94 mg/dL (ref 0.70–1.25)
Glucose, Bld: 107 mg/dL — ABNORMAL HIGH (ref 65–99)
POTASSIUM: 4.6 mmol/L (ref 3.5–5.3)
Sodium: 131 mmol/L — ABNORMAL LOW (ref 135–146)
TOTAL PROTEIN: 6.6 g/dL (ref 6.1–8.1)
Total Bilirubin: 0.6 mg/dL (ref 0.2–1.2)

## 2016-08-02 LAB — CBC WITH DIFFERENTIAL/PLATELET
Basophils Absolute: 0 cells/uL (ref 0–200)
Basophils Relative: 0 %
EOS PCT: 4 %
Eosinophils Absolute: 232 cells/uL (ref 15–500)
HCT: 39.6 % (ref 38.5–50.0)
Hemoglobin: 13.5 g/dL (ref 13.2–17.1)
LYMPHS ABS: 754 {cells}/uL — AB (ref 850–3900)
LYMPHS PCT: 13 %
MCH: 33.2 pg — AB (ref 27.0–33.0)
MCHC: 34.1 g/dL (ref 32.0–36.0)
MCV: 97.3 fL (ref 80.0–100.0)
MONO ABS: 580 {cells}/uL (ref 200–950)
MONOS PCT: 10 %
MPV: 8.5 fL (ref 7.5–12.5)
NEUTROS PCT: 73 %
Neutro Abs: 4234 cells/uL (ref 1500–7800)
PLATELETS: 228 10*3/uL (ref 140–400)
RBC: 4.07 MIL/uL — ABNORMAL LOW (ref 4.20–5.80)
RDW: 12.5 % (ref 11.0–15.0)
WBC: 5.8 10*3/uL (ref 3.8–10.8)

## 2016-08-02 LAB — VITAMIN B12: VITAMIN B 12: 589 pg/mL (ref 200–1100)

## 2016-08-02 NOTE — Addendum Note (Signed)
Addended by: Annamaria Helling on: 08/02/2016 08:49 AM   Modules accepted: Orders

## 2016-08-02 NOTE — Patient Instructions (Addendum)
1. Your provider has requested that you have labwork completed today. Please go to [facility] ([id number]) on the second floor of this building before leaving the office today. You do not need to check in. If you are not called within 15 minutes please check with the front desk.   2. We have scheduled you to see Dr. Alain Marion on 08/11/16 at 2:15 pm.

## 2016-08-03 ENCOUNTER — Telehealth: Payer: Self-pay | Admitting: Neurology

## 2016-08-03 ENCOUNTER — Telehealth: Payer: Self-pay | Admitting: Internal Medicine

## 2016-08-03 DIAGNOSIS — M546 Pain in thoracic spine: Secondary | ICD-10-CM | POA: Diagnosis not present

## 2016-08-03 NOTE — Telephone Encounter (Signed)
Patient states that he seen his urologist yesterday and his BP was low.  States that urologist thinks that this is due to his parkinson.  Patient would like to know what he can do in regard.  Patient also states that his endocrinologist was to send notes over in regard to the last visit he had with them.  Also has appt coming up with Dr.Plot on 11/29.

## 2016-08-03 NOTE — Telephone Encounter (Signed)
Bring BP record. Keep ROV If systolic WL<702 - reduce Losartan to 1/2 tab a day Thx

## 2016-08-03 NOTE — Telephone Encounter (Signed)
-----   Message from [place], DO sent at 08/03/2016  8:05 AM EST ----- You can let pt know that labs look okay/stable

## 2016-08-04 NOTE — Telephone Encounter (Signed)
Pt informed

## 2016-08-09 ENCOUNTER — Encounter: Payer: Self-pay | Admitting: Internal Medicine

## 2016-08-09 ENCOUNTER — Ambulatory Visit (INDEPENDENT_AMBULATORY_CARE_PROVIDER_SITE_OTHER): Payer: Medicare HMO | Admitting: Internal Medicine

## 2016-08-09 DIAGNOSIS — I10 Essential (primary) hypertension: Secondary | ICD-10-CM | POA: Diagnosis not present

## 2016-08-09 DIAGNOSIS — J209 Acute bronchitis, unspecified: Secondary | ICD-10-CM | POA: Diagnosis not present

## 2016-08-09 DIAGNOSIS — Z566 Other physical and mental strain related to work: Secondary | ICD-10-CM | POA: Diagnosis not present

## 2016-08-09 DIAGNOSIS — Z23 Encounter for immunization: Secondary | ICD-10-CM | POA: Diagnosis not present

## 2016-08-09 MED ORDER — AZITHROMYCIN 250 MG PO TABS: ORAL_TABLET | ORAL | 0 refills | Status: DC

## 2016-08-09 NOTE — Progress Notes (Signed)
Subjective:  Patient ID: William [name], male    DOB: 05/17/1951  Age: 65 y.o. MRN: [id number]  CC: No chief complaint on file.   HPI William [name] presents for low BP, Parkinson's. C/o low BP: reduced Losrtan to 50 mg/d. He had side effects w/Mirapex C/o cough x 2 week - green sputum  Outpatient Medications Prior to Visit  Medication Sig Dispense Refill  . aspirin (ASPIRIN CHILDRENS) 81 MG chewable tablet Chew 1 tablet (81 mg total) by mouth daily. 36 tablet 11  . ferrous sulfate 325 (65 FE) MG tablet Take 325 mg by mouth daily with breakfast.      . losartan (COZAAR) 100 MG tablet take 1 tablet by mouth once daily (Patient taking differently: take 1 half tablet by mouth once daily) 30 tablet 3  . omeprazole (PRILOSEC) 40 MG capsule take 1 capsule by mouth twice a day 60 capsule 6  . potassium citrate (UROCIT-K 10) 10 MEQ (1080 MG) SR tablet Take 10 mEq by mouth 2 (two) times daily.     . psyllium (METAMUCIL) 58.6 % powder Take 1 packet by mouth 2 (two) times daily. 1 TBS Twice Daily.    . Sildenafil Citrate (VIAGRA PO) Take by mouth. Reported on 11/12/2015     No facility-administered medications prior to visit.     ROS Review of Systems  Constitutional: Positive for fatigue. Negative for appetite change and unexpected weight change.  HENT: Negative for congestion, nosebleeds, sneezing, sore throat and trouble swallowing.   Eyes: Negative for itching and visual disturbance.  Respiratory: Negative for cough.   Cardiovascular: Negative for chest pain, palpitations and leg swelling.  Gastrointestinal: Negative for abdominal distention, blood in stool, diarrhea and nausea.  Genitourinary: Negative for frequency and hematuria.  Musculoskeletal: Negative for back pain, gait problem, joint swelling and neck pain.  Skin: Negative for rash.  Neurological: Positive for tremors. Negative for dizziness, speech difficulty and weakness.  Psychiatric/Behavioral: Negative for agitation,  dysphoric mood and sleep disturbance. The patient is not nervous/anxious.     Objective:  BP 112/68   Pulse 66   Temp 98.2 F (36.8 C) (Oral)   Resp 12   Ht 5' 7"  (1.702 m)   Wt 158 lb 6.4 oz (71.8 kg)   SpO2 98%   BMI 24.81 kg/m   BP Readings from Last 3 Encounters:  08/09/16 112/68  08/02/16 100/64  07/26/16 124/80    Wt Readings from Last 3 Encounters:  08/09/16 158 lb 6.4 oz (71.8 kg)  08/02/16 154 lb (69.9 kg)  07/26/16 157 lb (71.2 kg)    Physical Exam  Constitutional: He is oriented to person, place, and time. He appears well-developed. No distress.  NAD  HENT:  Mouth/Throat: Oropharynx is clear and moist.  Eyes: Conjunctivae are normal. Pupils are equal, round, and reactive to light.  Neck: Normal range of motion. No JVD present. No thyromegaly present.  Cardiovascular: Normal rate, regular rhythm, normal heart sounds and intact distal pulses.  Exam reveals no gallop and no friction rub.   No murmur heard. Pulmonary/Chest: Effort normal and breath sounds normal. No respiratory distress. He has no wheezes. He has no rales. He exhibits no tenderness.  Abdominal: Soft. Bowel sounds are normal. He exhibits no distension and no mass. There is no tenderness. There is no rebound and no guarding.  Musculoskeletal: Normal range of motion. He exhibits no edema or tenderness.  Lymphadenopathy:    He has no cervical adenopathy.  Neurological: He is  alert and oriented to person, place, and time. He has normal reflexes. No cranial nerve deficit. He exhibits normal muscle tone. He displays a negative Romberg sign. Coordination and gait normal.  Skin: Skin is warm and dry. No rash noted.  Psychiatric: He has a normal mood and affect. His behavior is normal. Judgment and thought content normal.  Tremor in hands  Lab Results  Component Value Date   WBC 5.8 08/02/2016   HGB 13.5 08/02/2016   HCT 39.6 08/02/2016   PLT 228 08/02/2016   GLUCOSE 107 (H) 08/02/2016   CHOL 199  06/18/2014   TRIG 117.0 06/18/2014   HDL 77.50 06/18/2014   LDLCALC 98 06/18/2014   ALT 19 08/02/2016   AST 18 08/02/2016   NA 131 (L) 08/02/2016   K 4.6 08/02/2016   CL 102 08/02/2016   CREATININE 0.94 08/02/2016   BUN 26 (H) 08/02/2016   CO2 22 08/02/2016   TSH 1.40 11/19/2015   PSA 0.99 09/28/2012    No results found.  Assessment & Plan:   There are no diagnoses linked to this encounter. I am having Mr. Lazarus maintain his ferrous sulfate, potassium citrate, psyllium, Sildenafil Citrate (VIAGRA PO), aspirin, losartan, and omeprazole.  No orders of the defined types were placed in this encounter.    Follow-up: No Follow-up on file.  Walker Kehr, MD

## 2016-08-09 NOTE — Assessment & Plan Note (Signed)
On Losartan 22m/d - ok to d/c and monitor BP  BP Readings from Last 3 Encounters:  08/09/16 112/68  08/02/16 100/64  07/26/16 124/80

## 2016-08-09 NOTE — Progress Notes (Signed)
Pre visit review using our clinic review tool, if applicable. No additional management support is needed unless otherwise documented below in the visit note. 

## 2016-08-09 NOTE — Assessment & Plan Note (Signed)
Zpac 

## 2016-08-09 NOTE — Patient Instructions (Signed)
OK to stop Losartan  Use over-the-counter  "cold" medicines  such as  "Afrin" nasal spray for nasal congestion as directed instead. Use" Delsym" or" Robitussin" cough syrup varietis for cough.  You can use plain "Tylenol" or "Advil" for fever, chills and achyness. Use Halls or Ricola cough drops.  Please, make an appointment if you are not better or if you're worse.

## 2016-08-09 NOTE — Assessment & Plan Note (Signed)
Stable stress

## 2016-08-11 ENCOUNTER — Ambulatory Visit: Payer: Medicare HMO | Admitting: Internal Medicine

## 2016-08-23 ENCOUNTER — Telehealth: Payer: Self-pay | Admitting: Emergency Medicine

## 2016-08-23 DIAGNOSIS — J040 Acute laryngitis: Secondary | ICD-10-CM | POA: Diagnosis not present

## 2016-08-23 DIAGNOSIS — J410 Simple chronic bronchitis: Secondary | ICD-10-CM | POA: Diagnosis not present

## 2016-08-23 DIAGNOSIS — J3489 Other specified disorders of nose and nasal sinuses: Secondary | ICD-10-CM | POA: Diagnosis not present

## 2016-08-23 DIAGNOSIS — J322 Chronic ethmoidal sinusitis: Secondary | ICD-10-CM | POA: Diagnosis not present

## 2016-08-23 DIAGNOSIS — J320 Chronic maxillary sinusitis: Secondary | ICD-10-CM | POA: Diagnosis not present

## 2016-08-23 NOTE — Telephone Encounter (Signed)
Use over-the-counter  "cold" medicines  such as "Afrin" nasal spray for nasal congestion as directed instead. Use" Delsym" or" Robitussin" cough syrup varietis for cough.  You can use plain "Tylenol" or "Advil" for fever, chills and achyness. Use Halls or Ricola cough drops.   "Common cold" symptoms are usually triggered by a virus.  The antibiotics are usually not necessary. On average, a" viral cold" illness would take 4-7 days to resolve. Please, make an appointment if you are not better or if you're worse.

## 2016-08-23 NOTE — Telephone Encounter (Signed)
Pt called and has a cough and chest congestion. Offered an appt but he refused and asked that I send a message to see what you recommend. Please advise thanks.

## 2016-08-24 NOTE — Telephone Encounter (Signed)
Notified pt w/MD response.../lmb 

## 2016-08-31 DIAGNOSIS — J320 Chronic maxillary sinusitis: Secondary | ICD-10-CM | POA: Diagnosis not present

## 2016-08-31 DIAGNOSIS — J410 Simple chronic bronchitis: Secondary | ICD-10-CM | POA: Diagnosis not present

## 2016-08-31 DIAGNOSIS — J322 Chronic ethmoidal sinusitis: Secondary | ICD-10-CM | POA: Diagnosis not present

## 2016-08-31 DIAGNOSIS — J3489 Other specified disorders of nose and nasal sinuses: Secondary | ICD-10-CM | POA: Diagnosis not present

## 2016-08-31 DIAGNOSIS — J370 Chronic laryngitis: Secondary | ICD-10-CM | POA: Diagnosis not present

## 2016-09-02 NOTE — Progress Notes (Signed)
William [name] was seen today in the movement disorders clinic for neurologic consultation at the request of Walker Kehr, MD.  The consultation is for the evaluation of tremor and gait instability.  Tremor is in both hands, but it is more in the R hand.  Pt states that he thought it was going on for 3 months but when he talked to his brother, he said he noted jaw tremor last summer.  He notes it at rest.  He also noted some shuffling.     02/26/16 update:  The patient follows up today regarding his new diagnosis of Parkinson's disease.  His wife accompanies him today and supplements the history.  He admits that there was an "emotional adjustment" to make over the diagnosis.  He states that the "jittery feeling" goes "up and down."  He is currently on no medications.  He has been exercising a "bunch".  Doing 30 min of cardio on home treadmill 4 days a week and does some weights 2 days a week.   Last visit, I talked to him about decreasing his alcohol content.  I checked lab work and his sodium was 128.  I asked him to follow-up with his primary care provider.  He did see his nurse practitioner and she felt that this was just chronic hyponatremia.  No falls but occasionally has "balance uncertainty", lightheadedness/near syncope, hallucinations since last visit.  Mood has been more irritable and wife states that she has noted a "real change."   Drinking 1-2 glasses of wine/night and sometimes will have "cocktail" as well.    06/28/16 update:  The patient follows up today, accompanied by his wife who supplements the history.  He is on no medication for this, as he has not wanted to be.  He has had no falls since last visit.  No lightheadedness or near syncope. He is doing cardio 5 days a week and feels "zapped" for the rest of the day.  Has had a little bit more tremor because of more stress.   In regards to alcohol, the patient states that he is drinking about the same, maybe a little less scotch.     07/30/16 update:  Pt f/u today.  He called his wife and she was in on the conference call.  I started him on Mirapex ER, and worked to 1.5 mg daily.  He called me and reported side effects with the medication.  He reported that he had a near passing out episode.  This occurred after drinking alcohol and his wife and friend had to help him out of the restaurant.  He called his pharmacist, who told him he should not be drinking alcohol with the medication.  We have discussed his alcohol intake several times in the past.  Ultimately, the patient ended up discontinuing his Mirapex ER.  States that it caused weight loss and diarrhea.  He does have a hx of UC and has a hx of diarrhea but not that often.  Pt concerned about low "BP."  Wife complains that he generally doesn't feel good.    09/14/16 update: The patient follows up today.  I have reviewed records since our last visit.  His losartan dose was initially dropped by half and about a week later, his primary care physician stopped the medication altogether.  The patient states that he is doing well off of the medication.  He has been keeping a log of it and brought it in for review and the  numbers looked good.  Otherwise, no changes since last visit besides for the fact that he had a URI.  States that he has had 3 rounds of antibiotics and still has productive cough in the AM.  Also c/o pain in the nose.  Saw ENT and was given ointment and not helpful.  Notes that has to purposefully swing arms to feel balanced.  No falls but some near falls.  R leg seems to "linger" when gets up and needs to turn - R leg isn't as fast to move.  Wife notes that he seems depressed.  Still drinking about same amount of EtOH.    PREVIOUS MEDS:  mirapex (states that he had diarrhea but does have hx of UC and diarrhea isn't unusual for him; also had near passing out episode but had been drinking EtOH when it happened)   ALLERGIES:  No Known Allergies  CURRENT MEDICATIONS:   Outpatient Encounter Prescriptions as of 09/14/2016  Medication Sig  . aspirin (ASPIRIN CHILDRENS) 81 MG chewable tablet Chew 1 tablet (81 mg total) by mouth daily.  . ferrous sulfate 325 (65 FE) MG tablet Take 325 mg by mouth daily with breakfast.    . omeprazole (PRILOSEC) 40 MG capsule take 1 capsule by mouth twice a day  . potassium citrate (UROCIT-K 10) 10 MEQ (1080 MG) SR tablet Take 10 mEq by mouth 2 (two) times daily.   . psyllium (METAMUCIL) 58.6 % powder Take 1 packet by mouth 2 (two) times daily. 1 TBS Twice Daily.  . Sildenafil Citrate (VIAGRA PO) Take by mouth. Reported on 11/12/2015  . [DISCONTINUED] azithromycin (ZITHROMAX) 250 MG tablet As directed   No facility-administered encounter medications on file as of 09/14/2016.     PAST MEDICAL HISTORY:   Past Medical History:  Diagnosis Date  . Allergic rhinitis   . Anemia, iron deficiency   . Arthritis   . Blood transfusion without reported diagnosis   . Colonic stricture   . ED (erectile dysfunction)   . Esophageal stricture    Status post upper endoscopy with esophageal dilatation  . Gastric polyps   . GERD (gastroesophageal reflux disease)   . Hiatal hernia   . Hypertension   . Inguinal hernia   . Kidney stone    x 2  . Parkinson disease ([place])   . Ulcerative colitis    status post total abdominal colectomy with ileoanal anastomosis    PAST SURGICAL HISTORY:   Past Surgical History:  Procedure Laterality Date  . COLECTOMY  1992   with ilial anal pull through (for ulcerative colitis)  . ETHMOIDECTOMY     and maxillary enterostomies '04, redo in 12/04  . INGUINAL HERNIA REPAIR    . KIDNEY STONE SURGERY     removed 2013  . KNEE ARTHROSCOPY Right 2004  . LASIK      SOCIAL HISTORY:   Social History   Social History  . Marital status: Married    Spouse name: N/A  . Number of children: N/A  . Years of education: N/A   Occupational History  . Mortgage Banker [employer]   Social History Main Topics   . Smoking status: Former Smoker    Quit date: 09/14/1979  . Smokeless tobacco: Never Used  . Alcohol use 0.0 oz/week     Comment: 1 glass wine nightly during week, 2-3 drinks a day on weekends  . Drug use: No  . Sexual activity: Yes   Other Topics Concern  . Not on file  Social History Narrative   [employer] grad   Married 13.5 years- divorced; married '05   No children   Work: Insurance underwriter   Patient is a former smoker. Quit in 1981   Alcohol use- yes social   Daily Caffeine use 2 cups coffee per day   Illicit drug use- no    FAMILY HISTORY:   Family Status  Relation Status  . Father Deceased   rectal cancer, multiple myeloma  . Mother Deceased   dementia, HTN  . Brother Alive   heart disease (identical)  . Brother Alive   sinus issues  . Neg Hx     ROS:  A complete 10 system review of systems was obtained and was unremarkable apart from what is mentioned above.  PHYSICAL EXAMINATION:    VITALS:   Vitals:   09/14/16 0827  BP: 130/70  Pulse: (!) 55  Weight: 163 lb (73.9 kg)  Height: _0  (1.702 m)   Wt Readings from Last 3 Encounters:  09/14/16 163 lb (73.9 kg)  08/09/16 158 lb 6.4 oz (71.8 kg)  08/02/16 154 lb (69.9 kg)     GEN:  The patient appears stated age and is in NAD. HEENT:  Normocephalic, atraumatic.  The mucous membranes are moist. The superficial temporal arteries are without ropiness or tenderness. CV:  Bradycardic.  Regular rhythm.   Lungs:  CTAB Neck/HEME:  There are no carotid bruits bilaterally.  Neurological examination:  Orientation: The patient is alert and oriented x3. Fund of knowledge is appropriate.  Recent and remote memory are intact.  Attention and concentration are normal.    Able to name objects and repeat phrases. Cranial nerves: There is good facial symmetry. There is facial hypomimiaPupils are equal round and reactive to light bilaterally. Fundoscopic exam reveals clear margins bilaterally. Extraocular muscles are  intact. The visual fields are full to confrontational testing. The speech is fluent and clear. Soft palate rises symmetrically and there is no tongue deviation. Hearing is intact to conversational tone. Sensation: Sensation is intact to light and pinprick throughout (facial, trunk, extremities). Vibration is intact at the bilateral big toe. There is no extinction with double simultaneous stimulation. There is no sensory dermatomal level identified. Motor: Strength is 5/5 in the bilateral upper and lower extremities.   Shoulder shrug is equal and symmetric.  There is no pronator drift.   Movement examination: Tone: There is mild increased tone in the right UE Abnormal movements: There is an intermittent, non rhythmic, jerking RUE resting tremor that does not increase significantly with distraction procedures.  Similar tremor can be felt in the L hand but seen less.  Mild RLE tremor is noted.  No postural tremor. Coordination:  There is decremation with all RAM's on the right, including alternating supination and pronation of the forearm, hand opening and closing, finger taps, heel taps and toe taps and turning in light bulb. Gait and Station: The patient has no difficulty arising out of a deep-seated chair without the use of the hands. The patient's stride length is good with very purposeful arm swing. The patient has a negative pull test.      LABS  Lab Results  Component Value Date   TSH 1.40 11/19/2015     Chemistry      Component Value Date/Time   NA 131 (L) 08/02/2016 0906   K 4.6 08/02/2016 0906   CL 102 08/02/2016 0906   CO2 22 08/02/2016 0906   BUN 26 (H) 08/02/2016 2297  CREATININE 0.94 08/02/2016 0906      Component Value Date/Time   CALCIUM 9.1 08/02/2016 0906   ALKPHOS 48 08/02/2016 0906   AST 18 08/02/2016 0906   ALT 19 08/02/2016 0906   BILITOT 0.6 08/02/2016 0906     Lab Results  Component Value Date   WBC 5.8 08/02/2016   HGB 13.5 08/02/2016   HCT 39.6  08/02/2016   MCV 97.3 08/02/2016   PLT 228 08/02/2016   Lab Results  Component Value Date   VITAMINB12 589 08/02/2016     ASSESSMENT/PLAN:  1. Idiopathic Parkinson's disease.  The patient just barely meets [place] brain bank criteria but he does meet it.  -We discussed the diagnosis as well as pathophysiology of the disease again as wife not here last visit.  We discussed treatment options as well as prognostic indicators.  Patient education was provided.  -Not convinced that all of the reported side effects were from the pramipexole ER but since we have other options decided to hold on reusing unless really need it  -talked about trying neupro since he had a colon resection in the past and this med is not absorbed via GI tract but via SQ.  Will start 2 mg and work to 29m.  Discussed sleep attacks, compulsive behaviors  -asked me about azilect/levodopa and reviewed indications for those meds 2.  EtOH overuse  -he and I discussed again that he needs to decrease EtOH intake.  Discussed this with pt  3.  Hx HTN  -able to get off of his losartan 4.  Weight loss and diarrhea  -more concerned about his UC.  These would be unusual sx's for PD.   5.  Depression  -pt denies and thinks just masked face but wife not convinced.  Will see how he does starting neupro and with getting back into exercise routine 6.    Follow up is anticipated in the next few months, sooner should new issues arise.  Much greater than 50% of this visit was spent in counseling and coordinating care.  Total face to face time:  40 min

## 2016-09-09 ENCOUNTER — Other Ambulatory Visit: Payer: Self-pay | Admitting: Internal Medicine

## 2016-09-14 ENCOUNTER — Encounter: Payer: Self-pay | Admitting: Neurology

## 2016-09-14 ENCOUNTER — Ambulatory Visit (INDEPENDENT_AMBULATORY_CARE_PROVIDER_SITE_OTHER): Payer: Medicare HMO | Admitting: Neurology

## 2016-09-14 VITALS — BP 130/70 | HR 55 | Ht 67.0 in | Wt 163.0 lb

## 2016-09-14 DIAGNOSIS — Z789 Other specified health status: Secondary | ICD-10-CM | POA: Diagnosis not present

## 2016-09-14 DIAGNOSIS — G20 Parkinson's disease: Secondary | ICD-10-CM

## 2016-09-14 DIAGNOSIS — Z7289 Other problems related to lifestyle: Secondary | ICD-10-CM

## 2016-09-14 MED ORDER — ROTIGOTINE 2 MG/24HR TD PT24: 1.0000 | MEDICATED_PATCH | Freq: Every day | TRANSDERMAL | 0 refills | Status: DC

## 2016-09-14 MED ORDER — ROTIGOTINE 4 MG/24HR TD PT24: 1.0000 | MEDICATED_PATCH | Freq: Every day | TRANSDERMAL | 0 refills | Status: DC

## 2016-09-14 NOTE — Patient Instructions (Signed)
Start Neupro 2 mg patches for one week, then switch to 4 mg patches.  Let us know how you are doing in 2 weeks. We may need to increase medication.

## 2016-09-20 DIAGNOSIS — N200 Calculus of kidney: Secondary | ICD-10-CM | POA: Diagnosis not present

## 2016-09-20 DIAGNOSIS — Z87442 Personal history of urinary calculi: Secondary | ICD-10-CM | POA: Diagnosis not present

## 2016-09-20 DIAGNOSIS — G20 Parkinson's disease: Secondary | ICD-10-CM | POA: Diagnosis not present

## 2016-09-20 DIAGNOSIS — R351 Nocturia: Secondary | ICD-10-CM | POA: Diagnosis not present

## 2016-09-21 ENCOUNTER — Ambulatory Visit: Payer: Managed Care, Other (non HMO) | Admitting: Neurology

## 2016-09-21 DIAGNOSIS — J322 Chronic ethmoidal sinusitis: Secondary | ICD-10-CM | POA: Diagnosis not present

## 2016-09-21 DIAGNOSIS — J320 Chronic maxillary sinusitis: Secondary | ICD-10-CM | POA: Diagnosis not present

## 2016-09-21 DIAGNOSIS — J3489 Other specified disorders of nose and nasal sinuses: Secondary | ICD-10-CM | POA: Diagnosis not present

## 2016-09-24 ENCOUNTER — Telehealth: Payer: Self-pay | Admitting: Neurology

## 2016-09-24 NOTE — Telephone Encounter (Signed)
Please call Dr Minna Merritts at [phone] or cell [phone]. I told his office you would not be in until Monday but if they needed to speak with another Dr we could get someone if it could not wait until Monday. They will check with him and give [place] a call back if it cant wait   Thank you  Hinton Dyer   I returned above phone call back today (09/25/15 at 9:55am).  States that pt not just having nose pain internally but patient describing lacinating pain in nose and face now and wonders if TN.  Asked me to see him in this regard.  Jade, make appt at next available f/u

## 2016-09-24 NOTE — Telephone Encounter (Signed)
He is on the wait list we will call him if something comes open  Thank you  Hinton Dyer

## 2016-09-24 NOTE — Telephone Encounter (Signed)
Sooner appt made.   William [name]- Can you put 10/14/16 appt on cancellation list?

## 2016-09-24 NOTE — Telephone Encounter (Signed)
Left message on machine for patient to call back.

## 2016-10-04 ENCOUNTER — Telehealth: Payer: Self-pay | Admitting: Neurology

## 2016-10-04 MED ORDER — ROTIGOTINE 4 MG/24HR TD PT24: 1.0000 | MEDICATED_PATCH | Freq: Every day | TRANSDERMAL | 2 refills | Status: DC

## 2016-10-04 NOTE — Telephone Encounter (Signed)
Yes.  Will likely end up having to do prior auth

## 2016-10-04 NOTE — Telephone Encounter (Signed)
RX sent to pharmacy  

## 2016-10-04 NOTE — Telephone Encounter (Signed)
PT called regarding his medication that Dr Tat had given him samples of and he said he only had two left/Dawn CB#[phone]

## 2016-10-04 NOTE — Telephone Encounter (Addendum)
Spoke with patient. He is on Neupro 4 mg patches and doing well. Symptoms are well controlled and no side effects. Okay to send RX for this dosage to pharmacy?

## 2016-10-11 NOTE — Progress Notes (Signed)
William [name] was seen today in the movement disorders clinic for neurologic consultation at the request of Walker Kehr, MD.  The consultation is for the evaluation of tremor and gait instability.  Tremor is in both hands, but it is more in the R hand.  Pt states that he thought it was going on for 3 months but when he talked to his brother, he said he noted jaw tremor last summer.  He notes it at rest.  He also noted some shuffling.     02/26/16 update:  The patient follows up today regarding his new diagnosis of Parkinson's disease.  His wife accompanies him today and supplements the history.  He admits that there was an "emotional adjustment" to make over the diagnosis.  He states that the "jittery feeling" goes "up and down."  He is currently on no medications.  He has been exercising a "bunch".  Doing 30 min of cardio on home treadmill 4 days a week and does some weights 2 days a week.   Last visit, I talked to him about decreasing his alcohol content.  I checked lab work and his sodium was 128.  I asked him to follow-up with his primary care provider.  He did see his nurse practitioner and she felt that this was just chronic hyponatremia.  No falls but occasionally has "balance uncertainty", lightheadedness/near syncope, hallucinations since last visit.  Mood has been more irritable and wife states that she has noted a "real change."   Drinking 1-2 glasses of wine/night and sometimes will have "cocktail" as well.    06/28/16 update:  The patient follows up today, accompanied by his wife who supplements the history.  He is on no medication for this, as he has not wanted to be.  He has had no falls since last visit.  No lightheadedness or near syncope. He is doing cardio 5 days a week and feels "zapped" for the rest of the day.  Has had a little bit more tremor because of more stress.   In regards to alcohol, the patient states that he is drinking about the same, maybe a little less scotch.     07/30/16 update:  Pt f/u today.  He called his wife and she was in on the conference call.  I started him on Mirapex ER, and worked to 1.5 mg daily.  He called me and reported side effects with the medication.  He reported that he had a near passing out episode.  This occurred after drinking alcohol and his wife and friend had to help him out of the restaurant.  He called his pharmacist, who told him he should not be drinking alcohol with the medication.  We have discussed his alcohol intake several times in the past.  Ultimately, the patient ended up discontinuing his Mirapex ER.  States that it caused weight loss and diarrhea.  He does have a hx of UC and has a hx of diarrhea but not that often.  Pt concerned about low "BP."  Wife complains that he generally doesn't feel good.    09/14/16 update: The patient follows up today.  I have reviewed records since our last visit.  His losartan dose was initially dropped by half and about a week later, his primary care physician stopped the medication altogether.  The patient states that he is doing well off of the medication.  He has been keeping a log of it and brought it in for review and the  numbers looked good.  Otherwise, no changes since last visit besides for the fact that he had a URI.  States that he has had 3 rounds of antibiotics and still has productive cough in the AM.  Also c/o pain in the nose.  Saw ENT and was given ointment and not helpful.  Notes that has to purposefully swing arms to feel balanced.  No falls but some near falls.  R leg seems to "linger" when gets up and needs to turn - R leg isn't as fast to move.  Wife notes that he seems depressed.  Still drinking about same amount of EtOH.    10/14/16 update:  Patient follows up today, accompanied by his wife who supplements the history.  I spoke with his ENT physician prior to today's visit.  The patient is having lancinating pain in the nose and face.  He tried various creams and ointments  without relief and thought that perhaps this represented trigeminal neuralgia.  The patient states that the pain is located over the L nose and radiates around the L eye.  Pt states that it is a "jabbing pain."  It happens on and off all day long.  Chewing will make it worse as does looking down.  Doesn't know if cold will set it off.  Has had productive cough, but some better after abx x 3. No neuroimaging.   In regards to his Parkinson's, he was started on Neupro last visit and worked to 4 mg daily.  He thinks that medication worked well, without side effects.  Tremor improved. No compulsive behaviors.  No sleep attacks.  No hallucinations.  No lightheadedness or near syncope.  PREVIOUS MEDS:  mirapex (states that he had diarrhea but does have hx of UC and diarrhea isn't unusual for him; also had near passing out episode but had been drinking EtOH when it happened)   ALLERGIES:  No Known Allergies  CURRENT MEDICATIONS:  Outpatient Encounter Prescriptions as of 10/14/2016  Medication Sig  . aspirin (ASPIRIN CHILDRENS) 81 MG chewable tablet Chew 1 tablet (81 mg total) by mouth daily.  . ferrous sulfate 325 (65 FE) MG tablet Take 325 mg by mouth daily with breakfast.    . omeprazole (PRILOSEC) 40 MG capsule take 1 capsule by mouth twice a day  . psyllium (METAMUCIL) 58.6 % powder Take 1 packet by mouth 2 (two) times daily. 1 TBS Twice Daily.  . rotigotine (NEUPRO) 4 MG/24HR Place 1 patch onto the skin daily.  . Sildenafil Citrate (VIAGRA PO) Take by mouth. Reported on 11/12/2015  . [DISCONTINUED] potassium citrate (UROCIT-K 10) 10 MEQ (1080 MG) SR tablet Take 10 mEq by mouth 2 (two) times daily.    No facility-administered encounter medications on file as of 10/14/2016.     PAST MEDICAL HISTORY:   Past Medical History:  Diagnosis Date  . Allergic rhinitis   . Anemia, iron deficiency   . Arthritis   . Blood transfusion without reported diagnosis   . Colonic stricture   . ED (erectile  dysfunction)   . Esophageal stricture    Status post upper endoscopy with esophageal dilatation  . Gastric polyps   . GERD (gastroesophageal reflux disease)   . Hiatal hernia   . Hypertension   . Inguinal hernia   . Kidney stone    x 2  . Parkinson disease ([place])   . Ulcerative colitis    status post total abdominal colectomy with ileoanal anastomosis    PAST SURGICAL HISTORY:  Past Surgical History:  Procedure Laterality Date  . COLECTOMY  1992   with ilial anal pull through (for ulcerative colitis)  . ETHMOIDECTOMY     and maxillary enterostomies '04, redo in 12/04  . INGUINAL HERNIA REPAIR    . KIDNEY STONE SURGERY     removed 2013  . KNEE ARTHROSCOPY Right 2004  . LASIK      SOCIAL HISTORY:   Social History   Social History  . Marital status: Married    Spouse name: N/A  . Number of children: N/A  . Years of education: N/A   Occupational History  . 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 Smoking status: Former Smoker    Quit date: 09/14/1979  . Smokeless tobacco: Never Used  . Alcohol use 0.0 oz/week     Comment: 2 glass wine nightly during week, 2-3 drinks a day on weekends  . Drug use: No  . Sexual activity: Yes   Other Topics Concern  . Not on file   Social History Narrative   [employer] grad   Married 13.5 years- divorced; married '05   No children   Work: Insurance underwriter   Patient is a former smoker. Quit in 1981   Alcohol use- yes social   Daily Caffeine use 2 cups coffee per day   Illicit drug use- no    FAMILY HISTORY:   Family Status  Relation Status  . Father Deceased   rectal cancer, multiple myeloma  . Mother Deceased   dementia, HTN  . Brother Alive   heart disease (identical)  . Brother Alive   sinus issues  . Neg Hx     ROS:  A complete 10 system review of systems was obtained and was unremarkable apart from what is mentioned above.  PHYSICAL EXAMINATION:    VITALS:   Vitals:   10/14/16 0823   BP: 112/72  Pulse: 68  Weight: 163 lb (73.9 kg)  Height: 5' 7"  (1.702 m)   Wt Readings from Last 3 Encounters:  10/14/16 163 lb (73.9 kg)  09/14/16 163 lb (73.9 kg)  08/09/16 158 lb 6.4 oz (71.8 kg)     GEN:  The patient appears stated age and is in NAD. HEENT:  Normocephalic, atraumatic.  The mucous membranes are moist. The superficial temporal arteries are without ropiness or tenderness. CV:  Bradycardic.  Regular rhythm.   Lungs:  CTAB Neck/HEME:  There are no carotid bruits bilaterally.  Neurological examination:  Orientation: The patient is alert and oriented x3. Fund of knowledge is appropriate.  Recent and remote memory are intact.  Attention and concentration are normal.    Able to name objects and repeat phrases. Cranial nerves: There is good facial symmetry. There is facial hypomimiaPupils are equal round and reactive to light bilaterally. Fundoscopic exam reveals clear margins bilaterally. Extraocular muscles are intact. The visual fields are full to confrontational testing. The speech is fluent and clear. Soft palate rises symmetrically and there is no tongue deviation. Hearing is intact to conversational tone. Sensation: Sensation is intact to light and pinprick throughout (facial, trunk, extremities). Vibration is intact at the bilateral big toe. There is no extinction with double simultaneous stimulation. There is no sensory dermatomal level identified. Motor: Strength is 5/5 in the bilateral upper and lower extremities.   Shoulder shrug is equal and symmetric.  There is no pronator drift.   Movement examination: Tone: There is normal tone Abnormal movements: There is no tremor  today Coordination:  There is decremation with all RAM's on the right, including alternating supination and pronation of the forearm, hand opening and closing, finger taps, heel taps and toe taps and turning in light bulb. Gait and Station: The patient has no difficulty arising out of a  deep-seated chair without the use of the hands. The patient's stride length is good with very purposeful arm swing. The patient has a negative pull test.      LABS  Lab Results  Component Value Date   TSH 1.40 11/19/2015     Chemistry      Component Value Date/Time   NA 131 (L) 08/02/2016 0906   K 4.6 08/02/2016 0906   CL 102 08/02/2016 0906   CO2 22 08/02/2016 0906   BUN 26 (H) 08/02/2016 0906   CREATININE 0.94 08/02/2016 0906      Component Value Date/Time   CALCIUM 9.1 08/02/2016 0906   ALKPHOS 48 08/02/2016 0906   AST 18 08/02/2016 0906   ALT 19 08/02/2016 0906   BILITOT 0.6 08/02/2016 0906     Lab Results  Component Value Date   WBC 5.8 08/02/2016   HGB 13.5 08/02/2016   HCT 39.6 08/02/2016   MCV 97.3 08/02/2016   PLT 228 08/02/2016   Lab Results  Component Value Date   VITAMINB12 589 08/02/2016     ASSESSMENT/PLAN:  1. Idiopathic Parkinson's disease.  The patient just barely meets [place] brain bank criteria but he does meet it.  -We discussed the diagnosis as well as pathophysiology of the disease again as wife not here last visit.  We discussed treatment options as well as prognostic indicators.  Patient education was provided.  -Not convinced that all of the reported side effects were from the pramipexole ER but since we have other options decided to hold on reusing unless really need it  -continue neupro 4 mg daily as doing better on that .  Risks, benefits, side effects and alternative therapies were discussed.  The opportunity to ask questions was given and they were answered to the best of my ability.  The patient expressed understanding and willingness to follow the outlined treatment protocols.  -asked me about azilect/levodopa and reviewed indications for those meds 2.  EtOH overuse  -he and I discussed again that he needs to decrease EtOH intake.  Discussed this with pt  3.  Hx HTN  -Now off of losartan 4.  Weight loss and diarrhea  -more concerned  about his UC.  These would be unusual sx's for PD.   5.  Trigeminal neuralgia  -start trileptal 150 mg bid x 1 week and then to 300 mg bid.     -check BMP now and in 2 weeks given risk of hyponatremia (and had baseline low Na in Nov)  -discussed need to d/c EtOH as this medication is also metabolized via liver.  -do MRI with attention to L trigeminal nerve 5.  Depression  -pt denies and thinks just masked face but wife not convinced.  Will see how he does starting neupro and with getting back into exercise routine 6.    Follow up is anticipated at previously scheduled appt in april, sooner should new issues arise.  Much greater than 50% of this visit was spent in counseling and coordinating care.  Total face to face time:  25 min

## 2016-10-14 ENCOUNTER — Telehealth: Payer: Self-pay | Admitting: Neurology

## 2016-10-14 ENCOUNTER — Encounter: Payer: Self-pay | Admitting: Neurology

## 2016-10-14 ENCOUNTER — Other Ambulatory Visit (INDEPENDENT_AMBULATORY_CARE_PROVIDER_SITE_OTHER): Payer: Medicare HMO

## 2016-10-14 ENCOUNTER — Ambulatory Visit (INDEPENDENT_AMBULATORY_CARE_PROVIDER_SITE_OTHER): Payer: Medicare HMO | Admitting: Neurology

## 2016-10-14 VITALS — BP 112/72 | HR 68 | Ht 67.0 in | Wt 163.0 lb

## 2016-10-14 DIAGNOSIS — G500 Trigeminal neuralgia: Secondary | ICD-10-CM | POA: Diagnosis not present

## 2016-10-14 DIAGNOSIS — G20A1 Parkinson's disease without dyskinesia, without mention of fluctuations: Secondary | ICD-10-CM

## 2016-10-14 DIAGNOSIS — G20 Parkinson's disease: Secondary | ICD-10-CM | POA: Diagnosis not present

## 2016-10-14 LAB — BASIC METABOLIC PANEL
BUN: 24 mg/dL — ABNORMAL HIGH (ref 6–23)
CO2: 26 mEq/L (ref 19–32)
CREATININE: 0.94 mg/dL (ref 0.40–1.50)
Calcium: 9.4 mg/dL (ref 8.4–10.5)
Chloride: 101 mEq/L (ref 96–112)
GFR: 85.55 mL/min (ref >60.00)
Glucose, Bld: 98 mg/dL (ref 70–99)
Potassium: 4.3 mEq/L (ref 3.5–5.1)
Sodium: 131 mEq/L — ABNORMAL LOW (ref 135–145)

## 2016-10-14 MED ORDER — OXCARBAZEPINE 300 MG PO TABS: 300.0000 mg | ORAL_TABLET | Freq: Two times a day (BID) | ORAL | 3 refills | Status: DC

## 2016-10-14 NOTE — Telephone Encounter (Signed)
-----   Message from [place], DO sent at 10/14/2016  1:37 PM EST ----- Tell patient that we will just need to monitor blood sodium as his is starting low already and has been low for a long time.  Would like to see decrease EtOH as new med goes through liver too

## 2016-10-14 NOTE — Addendum Note (Signed)
Addended byAnnamaria Helling on: 10/14/2016 08:57 AM   Modules accepted: Orders

## 2016-10-14 NOTE — Patient Instructions (Addendum)
1. We have sent a referral to [facility] for your MRI and they will call you directly to schedule your appt. They are located at [place]. If you need to contact them directly please call [phone].   2. Your provider has requested that you have labwork completed today. Please go to [facility] ([id number]) on the second floor of this building before leaving the office today. You do not need to check in. If you are not called within 15 minutes please check with the front desk.  In two weeks she would like to repeat this blood work. Please come to our office to check into the lab and we will have this repeated.   3. Start Trileptal as follows:  Take 1/2 tablet twice daily for one week, then increase to one tablet twice daily. Prescription has been sent to [facility] on [address].   4. Keep follow up appt.

## 2016-10-26 ENCOUNTER — Telehealth: Payer: Self-pay | Admitting: Neurology

## 2016-10-26 NOTE — Telephone Encounter (Signed)
Patient made aware. He was to have labs drawn two weeks from 10/14/16. He will come by this week to have these drawn.

## 2016-10-26 NOTE — Telephone Encounter (Signed)
Spoke with patient and he states since last Thursday/Friday when he increased Trileptal to 300 mg BID he has been foggy headed and tired.  His pain is better - but it was starting to get better on the 150 mg BID. Okay to advise patient to decrease back to 150 mg BID to see if side effects dissipate? Please advise.

## 2016-10-26 NOTE — Telephone Encounter (Signed)
Yes agree and will need labs soon (are those ordered?)

## 2016-10-26 NOTE — Telephone Encounter (Signed)
PT called and said the medication that was prescribed is affecting him in the morning/Dawn CB# [phone]

## 2016-11-02 ENCOUNTER — Ambulatory Visit
Admission: RE | Admit: 2016-11-02 | Discharge: 2016-11-02 | Disposition: A | Discharge status: Home / Self Care | Payer: Medicare HMO | Source: Ambulatory Visit | Attending: Neurology | Admitting: Neurology

## 2016-11-02 ENCOUNTER — Telehealth: Payer: Self-pay | Admitting: Neurology

## 2016-11-02 ENCOUNTER — Other Ambulatory Visit (INDEPENDENT_AMBULATORY_CARE_PROVIDER_SITE_OTHER): Payer: Medicare HMO

## 2016-11-02 DIAGNOSIS — G20A1 Parkinson's disease without dyskinesia, without mention of fluctuations: Secondary | ICD-10-CM

## 2016-11-02 DIAGNOSIS — G20 Parkinson's disease: Secondary | ICD-10-CM

## 2016-11-02 DIAGNOSIS — G500 Trigeminal neuralgia: Secondary | ICD-10-CM

## 2016-11-02 LAB — BASIC METABOLIC PANEL
BUN: 14 mg/dL (ref 6–23)
CO2: 28 meq/L (ref 19–32)
Calcium: 9 mg/dL (ref 8.4–10.5)
Chloride: 93 mEq/L — ABNORMAL LOW (ref 96–112)
Creatinine, Ser: 0.89 mg/dL (ref 0.40–1.50)
GFR: 91.1 mL/min (ref >60.00)
GLUCOSE: 99 mg/dL (ref 70–99)
POTASSIUM: 4.3 meq/L (ref 3.5–5.1)
SODIUM: 125 meq/L — AB (ref 135–145)

## 2016-11-02 NOTE — Telephone Encounter (Signed)
Start gabapentin 300 q hs for a week and if tolerated try 300 mg bid (I don't think he has tried it but confirm.  I did confirm no allergy to it)

## 2016-11-02 NOTE — Telephone Encounter (Signed)
-----   Message from [place], DO sent at 11/02/2016  3:42 PM EST ----- Find out if on trileptal 150 mg bid or 388m bid (I think he said he was backing down to 150 mg bid).  He has a baseline low sodium and it has gone a bit lower with the med.  May need to stop it depending on dose he is at.

## 2016-11-02 NOTE — Telephone Encounter (Signed)
Patient is taking 150 mg BID.  I advised him to stop medication due to decrease in sodium levels. Dr. Carles Collet please advise next steps.

## 2016-11-02 NOTE — Telephone Encounter (Signed)
Left message on machine for patient to call back.

## 2016-11-03 MED ORDER — GABAPENTIN 300 MG PO CAPS: 300.0000 mg | ORAL_CAPSULE | Freq: Two times a day (BID) | ORAL | 1 refills | Status: DC

## 2016-11-03 NOTE — Telephone Encounter (Signed)
Patient made aware MR showed blood vessel on trigeminal nerve that most likely causing symptoms. Aware we are going to try to treat with medication and he agrees with this plan.   He has not tried Gabapentin. RX sent to pharmacy and he is aware to start one tablet at night and increase to BID if tolerated.

## 2016-11-16 ENCOUNTER — Telehealth: Payer: Self-pay | Admitting: Neurology

## 2016-11-16 NOTE — Telephone Encounter (Signed)
PT called in regards to his medication/Dawn CB# [phone]

## 2016-11-16 NOTE — Telephone Encounter (Signed)
Given sodium drop with other meds and lack of efficacy and evidence of vessel on MRI, would he consider surgical consult?  May not be other efficacious routes

## 2016-11-16 NOTE — Telephone Encounter (Signed)
Spoke with patient and he has been on Gabapentin for two weeks. Last week he was taking 300 mg at bedtime. This week he has started 300 mg twice daily.  He states it is not helping his pain at all. Daytime dose is also making him drowsy and lethargic.  Please advise.

## 2016-11-16 NOTE — Telephone Encounter (Signed)
Spoke with patient and he would like to talk with neurosurgery.  Referral faxed to [facility] at [phone] with confirmation received. They will contact the patient to schedule.

## 2016-11-22 DIAGNOSIS — R030 Elevated blood-pressure reading, without diagnosis of hypertension: Secondary | ICD-10-CM | POA: Diagnosis not present

## 2016-11-22 DIAGNOSIS — Z6826 Body mass index (BMI) 26.0-26.9, adult: Secondary | ICD-10-CM | POA: Diagnosis not present

## 2016-11-22 DIAGNOSIS — G500 Trigeminal neuralgia: Secondary | ICD-10-CM | POA: Diagnosis not present

## 2016-11-25 ENCOUNTER — Telehealth: Payer: Self-pay | Admitting: Neurology

## 2016-11-25 NOTE — Telephone Encounter (Signed)
Note from Dr. Christella Noa that he saw the patient on 11/22/16 and offered pt microvascular decompression and pt was going to think about it and contact him back if wished to proceed.

## 2016-12-21 NOTE — Progress Notes (Signed)
William [name] was seen today in the movement disorders clinic for neurologic consultation at the request of Walker Kehr, MD.  The consultation is for the evaluation of tremor and gait instability.  Tremor is in both hands, but it is more in the R hand.  Pt states that he thought it was going on for 3 months but when he talked to his brother, he said he noted jaw tremor last summer.  He notes it at rest.  He also noted some shuffling.     02/26/16 update:  The patient follows up today regarding his new diagnosis of Parkinson's disease.  His wife accompanies him today and supplements the history.  He admits that there was an "emotional adjustment" to make over the diagnosis.  He states that the "jittery feeling" goes "up and down."  He is currently on no medications.  He has been exercising a "bunch".  Doing 30 min of cardio on home treadmill 4 days a week and does some weights 2 days a week.   Last visit, I talked to him about decreasing his alcohol content.  I checked lab work and his sodium was 128.  I asked him to follow-up with his primary care provider.  He did see his nurse practitioner and she felt that this was just chronic hyponatremia.  No falls but occasionally has "balance uncertainty", lightheadedness/near syncope, hallucinations since last visit.  Mood has been more irritable and wife states that she has noted a "real change."   Drinking 1-2 glasses of wine/night and sometimes will have "cocktail" as well.    06/28/16 update:  The patient follows up today, accompanied by his wife who supplements the history.  He is on no medication for this, as he has not wanted to be.  He has had no falls since last visit.  No lightheadedness or near syncope. He is doing cardio 5 days a week and feels "zapped" for the rest of the day.  Has had a little bit more tremor because of more stress.   In regards to alcohol, the patient states that he is drinking about the same, maybe a little less scotch.     07/30/16 update:  Pt f/u today.  He called his wife and she was in on the conference call.  I started him on Mirapex ER, and worked to 1.5 mg daily.  He called me and reported side effects with the medication.  He reported that he had a near passing out episode.  This occurred after drinking alcohol and his wife and friend had to help him out of the restaurant.  He called his pharmacist, who told him he should not be drinking alcohol with the medication.  We have discussed his alcohol intake several times in the past.  Ultimately, the patient ended up discontinuing his Mirapex ER.  States that it caused weight loss and diarrhea.  He does have a hx of UC and has a hx of diarrhea but not that often.  Pt concerned about low "BP."  Wife complains that he generally doesn't feel good.    09/14/16 update: The patient follows up today.  I have reviewed records since our last visit.  His losartan dose was initially dropped by half and about a week later, his primary care physician stopped the medication altogether.  The patient states that he is doing well off of the medication.  He has been keeping a log of it and brought it in for review and the  numbers looked good.  Otherwise, no changes since last visit besides for the fact that he had a URI.  States that he has had 3 rounds of antibiotics and still has productive cough in the AM.  Also c/o pain in the nose.  Saw ENT and was given ointment and not helpful.  Notes that has to purposefully swing arms to feel balanced.  No falls but some near falls.  R leg seems to "linger" when gets up and needs to turn - R leg isn't as fast to move.  Wife notes that he seems depressed.  Still drinking about same amount of EtOH.    10/14/16 update:  Patient follows up today, accompanied by his wife who supplements the history.  I spoke with his ENT physician prior to today's visit.  The patient is having lancinating pain in the nose and face.  He tried various creams and ointments  without relief and thought that perhaps this represented trigeminal neuralgia.  The patient states that the pain is located over the L nose and radiates around the L eye.  Pt states that it is a "jabbing pain."  It happens on and off all day long.  Chewing will make it worse as does looking down.  Doesn't know if cold will set it off.  Has had productive cough, but some better after abx x 3. No neuroimaging.   In regards to his Parkinson's, he was started on Neupro last visit and worked to 4 mg daily.  He thinks that medication worked well, without side effects.  Tremor improved. No compulsive behaviors.  No sleep attacks.  No hallucinations.  No lightheadedness or near syncope.  12/22/16 update:  Patient follows up today.  The patient remains on Neupro, 4 mg daily for Parkinson's disease. Having some skin irritation with the patch.   Patch is helping sx's.   No falls other that tripping over the dog.  No lightheadedness or near syncope.  No hallucinations.  No sleep attacks.  No compulsive behaviors.  His bigger issue has been trigeminal neuralgia.  We started him on Trileptal, which did seem to work, but caused hyponatremia.  We took him off of that and changed him to gabapentin, which did not help.  He is off of that now.  He got a surgical opinion from Dr. Cyndy Freeze on 11/22/2016.  Surgical decompression was offered given the MRI demonstrated AICA crossing near L trigeminal nerve root.  Pt wanted to think about that before proceeding and has not yet been back to neurosx.  Pain comes and goes.  Worse when in the shower and water hits it.    PREVIOUS MEDS:  mirapex (states that he had diarrhea but does have hx of UC and diarrhea isn't unusual for him; also had near passing out episode but had been drinking EtOH when it happened)   ALLERGIES:  No Known Allergies  CURRENT MEDICATIONS:  Outpatient Encounter Prescriptions as of 12/22/2016  Medication Sig  . aspirin (ASPIRIN CHILDRENS) 81 MG chewable tablet Chew  1 tablet (81 mg total) by mouth daily.  . ferrous sulfate 325 (65 FE) MG tablet Take 325 mg by mouth daily with breakfast.    . omeprazole (PRILOSEC) 40 MG capsule take 1 capsule by mouth twice a day  . psyllium (METAMUCIL) 58.6 % powder Take 1 packet by mouth 2 (two) times daily. 1 TBS Twice Daily.  . rotigotine (NEUPRO) 4 MG/24HR Place 1 patch onto the skin daily.  . Sildenafil Citrate (VIAGRA PO)  Take by mouth. Reported on 11/12/2015  . [DISCONTINUED] gabapentin (NEURONTIN) 300 MG capsule Take 1 capsule (300 mg total) by mouth 2 (two) times daily.  . [DISCONTINUED] Oxcarbazepine (TRILEPTAL) 300 MG tablet Take 1 tablet (300 mg total) by mouth 2 (two) times daily.   No facility-administered encounter medications on file as of 12/22/2016.     PAST MEDICAL HISTORY:   Past Medical History:  Diagnosis Date  . Allergic rhinitis   . Anemia, iron deficiency   . Arthritis   . Blood transfusion without reported diagnosis   . Colonic stricture   . ED (erectile dysfunction)   . Esophageal stricture    Status post upper endoscopy with esophageal dilatation  . Gastric polyps   . GERD (gastroesophageal reflux disease)   . Hiatal hernia   . Hypertension   . Inguinal hernia   . Kidney stone    x 2  . Parkinson disease ([place])   . Ulcerative colitis    status post total abdominal colectomy with ileoanal anastomosis    PAST SURGICAL HISTORY:   Past Surgical History:  Procedure Laterality Date  . COLECTOMY  1992   with ilial anal pull through (for ulcerative colitis)  . ETHMOIDECTOMY     and maxillary enterostomies '04, redo in 12/04  . INGUINAL HERNIA REPAIR    . KIDNEY STONE SURGERY     removed 2013  . KNEE ARTHROSCOPY Right 2004  . LASIK      SOCIAL HISTORY:   Social History   Social History  . Marital status: Married    Spouse name: N/A  . Number of children: N/A  . Years of education: N/A   Occupational History  . Mortgage Banker [employer]   Social History Main  Topics  . Smoking status: Former Smoker    Quit date: 09/14/1979  . Smokeless tobacco: Never Used  . Alcohol use 0.0 oz/week     Comment: glass wine a night/2 drinks weekend  . Drug use: No  . Sexual activity: Yes   Other Topics Concern  . Not on file   Social History Narrative   [employer] grad   Married 13.5 years- divorced; married '05   No children   Work: Insurance underwriter   Patient is a former smoker. Quit in 1981   Alcohol use- yes social   Daily Caffeine use 2 cups coffee per day   Illicit drug use- no    FAMILY HISTORY:   Family Status  Relation Status  . Father Deceased   rectal cancer, multiple myeloma  . Mother Deceased   dementia, HTN  . Brother Alive   heart disease (identical)  . Brother Alive   sinus issues  . Neg Hx     ROS:  A complete 10 system review of systems was obtained and was unremarkable apart from what is mentioned above.  PHYSICAL EXAMINATION:    VITALS:   Vitals:   12/22/16 0901  BP: 130/78  Pulse: 72  SpO2: 98%  Weight: 163 lb (73.9 kg)  Height: 5' 7" (1.702 m)   Wt Readings from Last 3 Encounters:  12/22/16 163 lb (73.9 kg)  10/14/16 163 lb (73.9 kg)  09/14/16 163 lb (73.9 kg)     GEN:  The patient appears stated age and is in NAD. HEENT:  Normocephalic, atraumatic.  The mucous membranes are moist. The superficial temporal arteries are without ropiness or tenderness. CV:  Bradycardic.  Regular rhythm.   Lungs:  CTAB Neck/HEME:  There  are no carotid bruits bilaterally.  Neurological examination:  Orientation: The patient is alert and oriented x3. Fund of knowledge is appropriate.  Recent and remote memory are intact.  Attention and concentration are normal.    Able to name objects and repeat phrases. Cranial nerves: There is good facial symmetry. There is facial hypomimiaPupils are equal round and reactive to light bilaterally. Fundoscopic exam reveals clear margins bilaterally. Extraocular muscles are intact. The visual fields  are full to confrontational testing. The speech is fluent and clear. Soft palate rises symmetrically and there is no tongue deviation. Hearing is intact to conversational tone. Sensation: Sensation is intact to light and pinprick throughout (facial, trunk, extremities). Vibration is intact at the bilateral big toe. There is no extinction with double simultaneous stimulation. There is no sensory dermatomal level identified. Motor: Strength is 5/5 in the bilateral upper and lower extremities.   Shoulder shrug is equal and symmetric.  There is no pronator drift.   Movement examination: Tone: There is normal tone Abnormal movements: There is rare tremor in the RUE Coordination:  There is decremation with finger taps and toe taps on the right.  RAMs on the L are good and other RAMs on the right are good Gait and Station: The patient has no difficulty arising out of a deep-seated chair without the use of the hands. The patient's stride length is good with very purposeful arm swing. The patient has a negative pull test.      LABS  Lab Results  Component Value Date   TSH 1.40 11/19/2015     Chemistry      Component Value Date/Time   NA 125 (L) 11/02/2016 1128   K 4.3 11/02/2016 1128   CL 93 (L) 11/02/2016 1128   CO2 28 11/02/2016 1128   BUN 14 11/02/2016 1128   CREATININE 0.89 11/02/2016 1128   CREATININE 0.94 08/02/2016 0906      Component Value Date/Time   CALCIUM 9.0 11/02/2016 1128   ALKPHOS 48 08/02/2016 0906   AST 18 08/02/2016 0906   ALT 19 08/02/2016 0906   BILITOT 0.6 08/02/2016 0906     Lab Results  Component Value Date   WBC 5.8 08/02/2016   HGB 13.5 08/02/2016   HCT 39.6 08/02/2016   MCV 97.3 08/02/2016   PLT 228 08/02/2016   Lab Results  Component Value Date   VITAMINB12 589 08/02/2016     ASSESSMENT/PLAN:  1. Idiopathic Parkinson's disease.  The patient just barely meets [place] brain bank criteria but he does meet it.  -We discussed the diagnosis as well as  pathophysiology of the disease again as wife not here last visit.  We discussed treatment options as well as prognostic indicators.  Patient education was provided.  -Not convinced that all of the reported side effects were from the pramipexole ER but since we have other options decided to hold on reusing unless really need it  -continue neupro 4 mg daily as doing better on that .    Risks, benefits, side effects and alternative therapies were discussed.  The opportunity to ask questions was given and they were answered to the best of my ability.  The patient expressed understanding and willingness to follow the outlined treatment protocols.  -start flonase on skin to avoid skin reaction  -asked me about azilect/levodopa and reviewed indications for those meds 2.  EtOH overuse  -has dropped to one glass per night, which is better and okay 3.  Hx HTN  -Now off  of losartan   4.  Trigeminal neuralgia  -Trileptal caused hyponatremia.     -Gabapentin has not been beneficial.  -He got a surgical opinion from Dr. Cyndy Freeze on 11/22/2016.  Surgical decompression was offered given the MRI demonstrated AICA crossing near L trigeminal nerve root.  Pt decided to hold on that for now 5.  Depression  -pt denies and thinks just masked face but wife not convinced.  Will see how he does starting neupro and with getting back into exercise routine 6.    Follow up is anticipated at previously scheduled appt in april, sooner should new issues arise.  Much greater than 50% of this visit was spent in counseling and coordinating care.  Total face to face time:  25 min

## 2016-12-22 ENCOUNTER — Ambulatory Visit (INDEPENDENT_AMBULATORY_CARE_PROVIDER_SITE_OTHER): Payer: Medicare HMO | Admitting: Neurology

## 2016-12-22 ENCOUNTER — Encounter: Payer: Self-pay | Admitting: Neurology

## 2016-12-22 VITALS — BP 130/78 | HR 72 | Ht 67.0 in | Wt 163.0 lb

## 2016-12-22 DIAGNOSIS — G20 Parkinson's disease: Secondary | ICD-10-CM | POA: Diagnosis not present

## 2016-12-22 DIAGNOSIS — G500 Trigeminal neuralgia: Secondary | ICD-10-CM

## 2016-12-22 MED ORDER — FLUTICASONE PROPIONATE 50 MCG/ACT NA SUSP: NASAL | 11 refills | Status: DC

## 2016-12-31 ENCOUNTER — Other Ambulatory Visit: Payer: Self-pay | Admitting: Neurology

## 2017-01-04 ENCOUNTER — Telehealth: Payer: Self-pay | Admitting: Neurology

## 2017-01-04 NOTE — Telephone Encounter (Signed)
Please advise. He has failed all the medications offered and declined surgery.

## 2017-01-04 NOTE — Telephone Encounter (Signed)
Spoke with [facility] and asked them to start the process of switching providers. They will call me back with any questions. They will let me know if Dr. Vertell Limber Does not accept the transfer.

## 2017-01-04 NOTE — Telephone Encounter (Signed)
Spoke with patient and he states he does not want to try any other medication.  He is interested in going ahead with surgery but his wife was not comfortable with Dr. Christella Noa.  He wanted to get a suggestion for another practice at [facility] (or somewhere) or wanted to know if he could switch providers at [facility].  Please advise.

## 2017-01-04 NOTE — Telephone Encounter (Signed)
Caller: PT   Urgent? No  Reason for the call: PT states his neuralgia is really acting up and painful

## 2017-01-04 NOTE — Telephone Encounter (Signed)
If he wants to change providers at [facility], then that will be up to the practice itself and patient has to call and request the transfer.  I would suggest Dr. Vertell Limber.    If wants referral to [facility], can see if Dr. Wende Mott does these surgeries.

## 2017-01-04 NOTE — Telephone Encounter (Signed)
I don't think he has tried lyrica and we can given him that if he would like 75 mg bid to start BUT this is going to keep coming and going regardless of med because of the structural issue he has and likely will need surgical intervention

## 2017-01-07 ENCOUNTER — Telehealth: Payer: Self-pay | Admitting: Neurology

## 2017-01-07 NOTE — Telephone Encounter (Signed)
Caller: PT  Urgent? No  Reason for the call: PT said he was waiting on a referral to Dr Vertell Limber or someone in [place] and has not heard anything yet/Dawn

## 2017-01-07 NOTE — Telephone Encounter (Signed)
Patient made aware it will be a couple weeks til he hears from them because of transferring of care protocols in their office. William [name] would be a longer wait time. He will let me know if he has any other questions.

## 2017-01-30 ENCOUNTER — Other Ambulatory Visit: Payer: Self-pay | Admitting: Internal Medicine

## 2017-02-01 ENCOUNTER — Encounter: Payer: Self-pay | Admitting: Nurse Practitioner

## 2017-02-01 ENCOUNTER — Ambulatory Visit (INDEPENDENT_AMBULATORY_CARE_PROVIDER_SITE_OTHER): Payer: Medicare HMO | Admitting: Nurse Practitioner

## 2017-02-01 VITALS — BP 132/74 | HR 53 | Temp 97.7°F | Ht 67.0 in | Wt 164.0 lb

## 2017-02-01 DIAGNOSIS — H6983 Other specified disorders of Eustachian tube, bilateral: Secondary | ICD-10-CM | POA: Diagnosis not present

## 2017-02-01 MED ORDER — FLUTICASONE PROPIONATE 50 MCG/ACT NA SUSP: 2.0000 | Freq: Every day | NASAL | 11 refills | Status: DC

## 2017-02-01 MED ORDER — CETIRIZINE HCL 10 MG PO TABS: 10.0000 mg | ORAL_TABLET | Freq: Every day | ORAL | 0 refills | Status: DC

## 2017-02-01 NOTE — Progress Notes (Signed)
Subjective:  Patient ID: William [name], male    DOB: October 29, 1950  Age: 66 y.o. MRN: [id number]  CC: Ear Pain (right ear pain 5 days)   Otalgia   There is pain in the right ear. This is a new problem. The current episode started in the past 7 days. The problem occurs constantly. The problem has been waxing and waning. There has been no fever. Pertinent negatives include no abdominal pain, coughing, diarrhea, ear discharge, headaches, hearing loss, neck pain, rash, rhinorrhea, sore throat or vomiting. He has tried nothing for the symptoms. There is no history of a chronic ear infection, hearing loss or a tympanostomy tube.    Outpatient Medications Prior to Visit  Medication Sig Dispense Refill  . aspirin (ASPIRIN CHILDRENS) 81 MG chewable tablet Chew 1 tablet (81 mg total) by mouth daily. 36 tablet 11  . ferrous sulfate 325 (65 FE) MG tablet Take 325 mg by mouth daily with breakfast.      . NEUPRO 4 MG/24HR place 1 patch ONTO THE SKIN once daily 30 patch 5  . omeprazole (PRILOSEC) 40 MG capsule take 1 capsule by mouth twice a day 60 capsule 6  . psyllium (METAMUCIL) 58.6 % powder Take 1 packet by mouth 2 (two) times daily. 1 TBS Twice Daily.    . Sildenafil Citrate (VIAGRA PO) Take by mouth. Reported on 11/12/2015    . fluticasone (FLONASE) 50 MCG/ACT nasal spray As directed 16 g 11   No facility-administered medications prior to visit.     ROS See HPI  Objective:  BP 132/74   Pulse (!) 53   Temp 97.7 F (36.5 C)   Ht 5' 7"  (1.702 m)   Wt 164 lb (74.4 kg)   SpO2 98%   BMI 25.69 kg/m   BP Readings from Last 3 Encounters:  02/01/17 132/74  12/22/16 130/78  10/14/16 112/72    Wt Readings from Last 3 Encounters:  02/01/17 164 lb (74.4 kg)  12/22/16 163 lb (73.9 kg)  10/14/16 163 lb (73.9 kg)    Physical Exam  Constitutional: No distress.  HENT:  Head: Normocephalic.  Right Ear: External ear and ear canal normal. No tenderness. No mastoid tenderness. Tympanic membrane  is not injected and not erythematous. A middle ear effusion is present.  Left Ear: External ear and ear canal normal. No tenderness. No mastoid tenderness. Tympanic membrane is not erythematous. A middle ear effusion is present.  Nose: Nose normal.  Mouth/Throat: Oropharynx is clear and moist. No oropharyngeal exudate.  Eyes: Conjunctivae and EOM are normal. Pupils are equal, round, and reactive to light. Right eye exhibits no discharge. Left eye exhibits no discharge. No scleral icterus.  Neck: Normal range of motion. Neck supple. No thyromegaly present.  Cardiovascular: Normal rate.   Pulmonary/Chest: Effort normal.  Lymphadenopathy:    He has no cervical adenopathy.  Vitals reviewed.   Lab Results  Component Value Date   WBC 5.8 08/02/2016   HGB 13.5 08/02/2016   HCT 39.6 08/02/2016   PLT 228 08/02/2016   GLUCOSE 99 11/02/2016   CHOL 199 06/18/2014   TRIG 117.0 06/18/2014   HDL 77.50 06/18/2014   LDLCALC 98 06/18/2014   ALT 19 08/02/2016   AST 18 08/02/2016   NA 125 (L) 11/02/2016   K 4.3 11/02/2016   CL 93 (L) 11/02/2016   CREATININE 0.89 11/02/2016   BUN 14 11/02/2016   CO2 28 11/02/2016   TSH 1.40 11/19/2015   PSA 0.99 09/28/2012  Mr Face/trigeminal Wo/w Cm  Result Date: 11/02/2016 CLINICAL DATA:  Trigeminal neuralgia. Left-sided facial pain for 3 months. EXAM: MRI FACE TRIGEMINAL WITHOUT AND WITH CONTRAST TECHNIQUE: Multiplanar, multiecho pulse sequences of the face and surrounding structures, including thin slice imaging of the course of the Trigeminal Nerves, were obtained both before and after administration of intravenous contrast. CONTRAST:  15 mL MultiHance COMPARISON:  Limited coronal sinus CT 10/23/2009 FINDINGS: There is a vessel which appears to be the left AICA which crosses just inferior to the left trigeminal nerve near the root entry zone with question of subtle superolateral displacement of the nerve. The cisternal segment of the right trigeminal nerve is  unremarkable. [place] are normal in appearance bilaterally. No mass is identified along the course of the trigeminal nerves. The visualized portion of the brain is unremarkable. The orbits are unremarkable. Postsurgical changes are noted in the paranasal sinuses. There is mild bilateral maxillary sinus mucosal thickening and small volume fluid, with a mucous retention cyst on the left. There is mild bilateral ethmoid sinus mucosal thickening. The mastoid air cells are clear. IMPRESSION: 1. Crossing vessel (likely the left AICA) near the left trigeminal nerve root entry zone. 2. No mass. Electronically Signed   By: Logan Bores M.D.   On: 11/02/2016 13:26    Assessment & Plan:   William [name] was seen today for ear pain.  Diagnoses and all orders for this visit:  Eustachian tube dysfunction, bilateral -     fluticasone (FLONASE) 50 MCG/ACT nasal spray; Place 2 sprays into both nostrils daily. As directed -     cetirizine (ZYRTEC) 10 MG tablet; Take 1 tablet (10 mg total) by mouth at bedtime.   I have changed William [name]'s fluticasone. I am also having him start on cetirizine. Additionally, I am having him maintain his ferrous sulfate, psyllium, Sildenafil Citrate (VIAGRA PO), aspirin, omeprazole, and NEUPRO.  Meds ordered this encounter  Medications  . fluticasone (FLONASE) 50 MCG/ACT nasal spray    Sig: Place 2 sprays into both nostrils daily. As directed    Dispense:  16 g    Refill:  11    Order Specific Question:   Supervising Provider    Answer:   Cassandria Anger [[id number]]  . cetirizine (ZYRTEC) 10 MG tablet    Sig: Take 1 tablet (10 mg total) by mouth at bedtime.    Dispense:  30 tablet    Refill:  0    Order Specific Question:   Supervising Provider    Answer:   Cassandria Anger [[id number]]    Follow-up: Return if symptoms worsen or fail to improve.  Wilfred Lacy, NP

## 2017-02-01 NOTE — Patient Instructions (Signed)
Eustachian Tube Dysfunction The eustachian tube connects the middle ear to the back of the nose. It regulates air pressure in the middle ear by allowing air to move between the ear and nose. It also helps to drain fluid from the middle ear space. When the eustachian tube does not function properly, air pressure, fluid, or both can build up in the middle ear. Eustachian tube dysfunction can affect one or both ears. What are the causes? This condition happens when the eustachian tube becomes blocked or cannot open normally. This may result from:  Ear infections.  Colds and other upper respiratory infections.  Allergies.  Irritation, such as from cigarette smoke or acid from the stomach coming up into the esophagus (gastroesophageal reflux).  Sudden changes in air pressure, such as from descending in an airplane.  Abnormal growths in the nose or throat, such as nasal polyps, tumors, or enlarged tissue at the back of the throat (adenoids). What increases the risk? This condition may be more likely to develop in people who smoke and people who are overweight. Eustachian tube dysfunction may also be more likely to develop in children, especially children who have:  Certain birth defects of the mouth, such as cleft palate.  Large tonsils and adenoids. What are the signs or symptoms? Symptoms of this condition may include:  A feeling of fullness in the ear.  Ear pain.  Clicking or popping noises in the ear.  Ringing in the ear.  Hearing loss.  Loss of balance. Symptoms may get worse when the air pressure around you changes, such as when you travel to an area of high elevation or fly on an airplane. How is this diagnosed? This condition may be diagnosed based on:  Your symptoms.  A physical exam of your ear, nose, and throat.  Tests, such as those that measure:  The movement of your eardrum (tympanogram).  Your hearing (audiometry). How is this treated? Treatment depends on  the cause and severity of your condition. If your symptoms are mild, you may be able to relieve your symptoms by moving air into ("popping") your ears. If you have symptoms of fluid in your ears, treatment may include:  Decongestants.  Antihistamines.  Nasal sprays or ear drops that contain medicines that reduce swelling (steroids). In some cases, you may need to have a procedure to drain the fluid in your eardrum (myringotomy). In this procedure, a small tube is placed in the eardrum to:  Drain the fluid.  Restore the air in the middle ear space. Follow these instructions at home:  Take over-the-counter and prescription medicines only as told by your health care provider.  Use techniques to help pop your ears as recommended by your health care provider. These may include:  Chewing gum.  Yawning.  Frequent, forceful swallowing.  Closing your mouth, holding your nose closed, and gently blowing as if you are trying to blow air out of your nose.  Do not do any of the following until your health care provider approves:  Travel to high altitudes.  Fly in airplanes.  Work in a Pension scheme manager or room.  Scuba dive.  Keep your ears dry. Dry your ears completely after showering or bathing.  Do not smoke.  Keep all follow-up visits as told by your health care provider. This is important. Contact a health care provider if:  Your symptoms do not go away after treatment.  Your symptoms come back after treatment.  You are unable to pop your ears.  You have:  A fever.  Pain in your ear.  Pain in your head or neck.  Fluid draining from your ear.  Your hearing suddenly changes.  You become very dizzy.  You lose your balance. This information is not intended to replace advice given to you by your health care provider. Make sure you discuss any questions you have with your health care provider. Document Released: 09/26/2015 Document Revised: 02/05/2016 Document  Reviewed: 09/18/2014 Elsevier Interactive Patient Education  2017 [employer].

## 2017-03-09 DIAGNOSIS — G20 Parkinson's disease: Secondary | ICD-10-CM | POA: Diagnosis not present

## 2017-03-09 DIAGNOSIS — G500 Trigeminal neuralgia: Secondary | ICD-10-CM | POA: Diagnosis not present

## 2017-03-14 ENCOUNTER — Telehealth: Payer: Self-pay | Admitting: Neurology

## 2017-03-14 NOTE — Telephone Encounter (Signed)
Patient made aware no neurologic reason he can't donate blood.

## 2017-03-14 NOTE — Telephone Encounter (Signed)
PT left a voicemail message wanting to know if it was ok to give blood, please advise

## 2017-03-18 ENCOUNTER — Telehealth: Payer: Self-pay

## 2017-03-18 NOTE — Telephone Encounter (Signed)
Patient is on the list for [employer] 2018 and may be a good candidate for an AWV. Please let me know if/when appt is scheduled.

## 2017-03-18 NOTE — Telephone Encounter (Signed)
Pt is eligible for Welcome to Medicare Visit. Pt scheduled appt for 04/04/2017 with Dr. Camila Li.

## 2017-04-04 ENCOUNTER — Encounter: Payer: Self-pay | Admitting: Internal Medicine

## 2017-04-04 ENCOUNTER — Ambulatory Visit (INDEPENDENT_AMBULATORY_CARE_PROVIDER_SITE_OTHER): Payer: Medicare HMO | Admitting: Internal Medicine

## 2017-04-04 VITALS — BP 132/82 | HR 65 | Temp 98.4°F | Ht 67.0 in | Wt 167.0 lb

## 2017-04-04 DIAGNOSIS — S61219A Laceration without foreign body of unspecified finger without damage to nail, initial encounter: Secondary | ICD-10-CM | POA: Insufficient documentation

## 2017-04-04 DIAGNOSIS — S61216A Laceration without foreign body of right little finger without damage to nail, initial encounter: Secondary | ICD-10-CM

## 2017-04-04 DIAGNOSIS — Z23 Encounter for immunization: Secondary | ICD-10-CM | POA: Diagnosis not present

## 2017-04-04 DIAGNOSIS — G20 Parkinson's disease: Secondary | ICD-10-CM

## 2017-04-04 DIAGNOSIS — I10 Essential (primary) hypertension: Secondary | ICD-10-CM

## 2017-04-04 DIAGNOSIS — Z0001 Encounter for general adult medical examination with abnormal findings: Secondary | ICD-10-CM

## 2017-04-04 DIAGNOSIS — N320 Bladder-neck obstruction: Secondary | ICD-10-CM

## 2017-04-04 DIAGNOSIS — Z0000 Encounter for general adult medical examination without abnormal findings: Secondary | ICD-10-CM

## 2017-04-04 NOTE — Assessment & Plan Note (Signed)
Steri-strips to stop bleading Wound cleaned

## 2017-04-04 NOTE — Assessment & Plan Note (Signed)
F/u w/dr Tat

## 2017-04-04 NOTE — Assessment & Plan Note (Signed)
labs

## 2017-04-04 NOTE — Assessment & Plan Note (Addendum)
Here for medicare wellness/physical  Diet: heart healthy  Physical activity: not sedentary  Depression/mood screen: negative  Hearing: intact to whispered voice  Visual acuity: grossly normal w/glasses, performs annual eye exam  ADLs: capable  Fall risk: low   Home safety: good  Cognitive evaluation: intact to orientation, naming, recall and repetition  EOL planning: adv directives, full code/ I agree  I have personally reviewed and have noted  1. The patient's medical, surgical and social history  2. Their use of alcohol, tobacco or illicit drugs  3. Their current medications and supplements  4. The patient's functional ability including ADL's, fall risks, home safety risks and hearing or visual impairment.  5. Diet and physical activities  6. Evidence for depression or mood disorders 7. The roster of all physicians providing medical care to patient - is listed in the Snapshot section of the chart and reviewed today.    Today patient counseled on age appropriate routine health concerns for screening and prevention, each reviewed and up to date or declined. Immunizations reviewed and up to date or declined. Labs ordered and reviewed. Risk factors for depression reviewed and negative. Hearing function and visual acuity are intact. ADLs screened and addressed as needed. Functional ability and level of safety reviewed and appropriate. Education, counseling and referrals performed based on assessed risks today. Patient provided with a copy of personalized plan for preventive services.   PSA and prostate exam per Dr Elnoria Howard in [place] [facility] - Dr Henrene Pastor w/baloon dilatation; last 2015  Wine: reduce to <2 glasses at night adviced

## 2017-04-04 NOTE — Patient Instructions (Signed)

## 2017-04-04 NOTE — Progress Notes (Addendum)
Subjective:  Patient ID: William [name], male    DOB: 1951/07/15  Age: 66 y.o. MRN: [id number]  CC: No chief complaint on file.   HPI William [name] presents for a [facility] well exam C/o R 5th finger laceration this am   Outpatient Medications Prior to Visit  Medication Sig Dispense Refill  . aspirin (ASPIRIN CHILDRENS) 81 MG chewable tablet Chew 1 tablet (81 mg total) by mouth daily. 36 tablet 11  . cetirizine (ZYRTEC) 10 MG tablet Take 1 tablet (10 mg total) by mouth at bedtime. 30 tablet 0  . ferrous sulfate 325 (65 FE) MG tablet Take 325 mg by mouth daily with breakfast.      . fluticasone (FLONASE) 50 MCG/ACT nasal spray Place 2 sprays into both nostrils daily. As directed 16 g 11  . NEUPRO 4 MG/24HR place 1 patch ONTO THE SKIN once daily 30 patch 5  . omeprazole (PRILOSEC) 40 MG capsule take 1 capsule by mouth twice a day 60 capsule 2  . psyllium (METAMUCIL) 58.6 % powder Take 1 packet by mouth 2 (two) times daily. 1 TBS Twice Daily.    . Sildenafil Citrate (VIAGRA PO) Take by mouth. Reported on 11/12/2015     No facility-administered medications prior to visit.     ROS Review of Systems  Constitutional: Negative for appetite change, fatigue and unexpected weight change.  HENT: Negative for congestion, nosebleeds, sneezing, sore throat and trouble swallowing.   Eyes: Negative for itching and visual disturbance.  Respiratory: Negative for cough.   Cardiovascular: Negative for chest pain, palpitations and leg swelling.  Gastrointestinal: Negative for abdominal distention, blood in stool, diarrhea and nausea.  Genitourinary: Negative for frequency and hematuria.  Musculoskeletal: Negative for back pain, gait problem, joint swelling and neck pain.  Skin: Positive for wound. Negative for rash.  Neurological: Positive for tremors. Negative for dizziness, speech difficulty and weakness.  Psychiatric/Behavioral: Negative for agitation, dysphoric mood, sleep disturbance and suicidal  ideas. The patient is not nervous/anxious.     Objective:  BP 132/82 (BP Location: Left Arm, Patient Position: Sitting, Cuff Size: Normal)   Pulse 65   Temp 98.4 F (36.9 C) (Oral)   Ht 5' 7"  (1.702 m)   Wt 167 lb (75.8 kg)   SpO2 99%   BMI 26.16 kg/m   BP Readings from Last 3 Encounters:  04/04/17 132/82  02/01/17 132/74  12/22/16 130/78    Wt Readings from Last 3 Encounters:  04/04/17 167 lb (75.8 kg)  02/01/17 164 lb (74.4 kg)  12/22/16 163 lb (73.9 kg)    Physical Exam  Constitutional: He is oriented to person, place, and time. He appears well-developed. No distress.  NAD  HENT:  Mouth/Throat: Oropharynx is clear and moist.  Eyes: Pupils are equal, round, and reactive to light. Conjunctivae are normal.  Neck: Normal range of motion. No JVD present. No thyromegaly present.  Cardiovascular: Normal rate, regular rhythm, normal heart sounds and intact distal pulses.  Exam reveals no gallop and no friction rub.   No murmur heard. Pulmonary/Chest: Effort normal and breath sounds normal. No respiratory distress. He has no wheezes. He has no rales. He exhibits no tenderness.  Abdominal: Soft. Bowel sounds are normal. He exhibits no distension and no mass. There is no tenderness. There is no rebound and no guarding.  Musculoskeletal: Normal range of motion. He exhibits no edema or tenderness.  Lymphadenopathy:    He has no cervical adenopathy.  Neurological: He is alert and oriented  to person, place, and time. He has normal reflexes. No cranial nerve deficit. He exhibits normal muscle tone. He displays a negative Romberg sign. Coordination and gait normal.  Skin: Skin is warm and dry. No rash noted.  Psychiatric: He has a normal mood and affect. His behavior is normal. Judgment and thought content normal.  bleeding laceration R dist phalanx 9x5 mm Rectal - per Urology  Lab Results  Component Value Date   WBC 5.8 08/02/2016   HGB 13.5 08/02/2016   HCT 39.6 08/02/2016    PLT 228 08/02/2016   GLUCOSE 99 11/02/2016   CHOL 199 06/18/2014   TRIG 117.0 06/18/2014   HDL 77.50 06/18/2014   LDLCALC 98 06/18/2014   ALT 19 08/02/2016   AST 18 08/02/2016   NA 125 (L) 11/02/2016   K 4.3 11/02/2016   CL 93 (L) 11/02/2016   CREATININE 0.89 11/02/2016   BUN 14 11/02/2016   CO2 28 11/02/2016   TSH 1.40 11/19/2015   PSA 0.99 09/28/2012    Mr Face/trigeminal Wo/w Cm  Result Date: 11/02/2016 CLINICAL DATA:  Trigeminal neuralgia. Left-sided facial pain for 3 months. EXAM: MRI FACE TRIGEMINAL WITHOUT AND WITH CONTRAST TECHNIQUE: Multiplanar, multiecho pulse sequences of the face and surrounding structures, including thin slice imaging of the course of the Trigeminal Nerves, were obtained both before and after administration of intravenous contrast. CONTRAST:  15 mL MultiHance COMPARISON:  Limited coronal sinus CT 10/23/2009 FINDINGS: There is a vessel which appears to be the left AICA which crosses just inferior to the left trigeminal nerve near the root entry zone with question of subtle superolateral displacement of the nerve. The cisternal segment of the right trigeminal nerve is unremarkable. [place] are normal in appearance bilaterally. No mass is identified along the course of the trigeminal nerves. The visualized portion of the brain is unremarkable. The orbits are unremarkable. Postsurgical changes are noted in the paranasal sinuses. There is mild bilateral maxillary sinus mucosal thickening and small volume fluid, with a mucous retention cyst on the left. There is mild bilateral ethmoid sinus mucosal thickening. The mastoid air cells are clear. IMPRESSION: 1. Crossing vessel (likely the left AICA) near the left trigeminal nerve root entry zone. 2. No mass. Electronically Signed   By: Logan Bores M.D.   On: 11/02/2016 13:26    Assessment & Plan:   There are no diagnoses linked to this encounter. I am having William [name] maintain his ferrous sulfate, psyllium,  Sildenafil Citrate (VIAGRA PO), aspirin, NEUPRO, omeprazole, fluticasone, and cetirizine.  No orders of the defined types were placed in this encounter.    Follow-up: No Follow-up on file.  Walker Kehr, MD

## 2017-04-06 ENCOUNTER — Other Ambulatory Visit (INDEPENDENT_AMBULATORY_CARE_PROVIDER_SITE_OTHER): Payer: Medicare HMO

## 2017-04-06 DIAGNOSIS — Z0000 Encounter for general adult medical examination without abnormal findings: Secondary | ICD-10-CM

## 2017-04-06 DIAGNOSIS — I10 Essential (primary) hypertension: Secondary | ICD-10-CM | POA: Diagnosis not present

## 2017-04-06 DIAGNOSIS — N320 Bladder-neck obstruction: Secondary | ICD-10-CM

## 2017-04-06 DIAGNOSIS — G20 Parkinson's disease: Secondary | ICD-10-CM

## 2017-04-06 LAB — URINALYSIS
BILIRUBIN URINE: NEGATIVE
HGB URINE DIPSTICK: NEGATIVE
KETONES UR: NEGATIVE
Leukocytes, UA: NEGATIVE
Nitrite: NEGATIVE
SPECIFIC GRAVITY, URINE: 1.02 (ref 1.000–1.030)
URINE GLUCOSE: NEGATIVE
UROBILINOGEN UA: 0.2 (ref 0.0–1.0)
pH: 6 (ref 5.0–8.0)

## 2017-04-06 LAB — CBC WITH DIFFERENTIAL/PLATELET
Basophils Absolute: 0 10*3/uL (ref 0.0–0.1)
Basophils Relative: 0.8 % (ref 0.0–3.0)
EOS ABS: 0.2 10*3/uL (ref 0.0–0.7)
EOS PCT: 4.3 % (ref 0.0–5.0)
HEMATOCRIT: 39.3 % (ref 39.0–52.0)
HEMOGLOBIN: 13.5 g/dL (ref 13.0–17.0)
LYMPHS PCT: 17.2 % (ref 12.0–46.0)
Lymphs Abs: 0.8 10*3/uL (ref 0.7–4.0)
MCHC: 34.3 g/dL (ref 30.0–36.0)
MCV: 97.4 fl (ref 78.0–100.0)
MONO ABS: 0.5 10*3/uL (ref 0.1–1.0)
Monocytes Relative: 10.6 % (ref 3.0–12.0)
Neutro Abs: 3.2 10*3/uL (ref 1.4–7.7)
Neutrophils Relative %: 67.1 % (ref 43.0–77.0)
Platelets: 200 10*3/uL (ref 150.0–400.0)
RBC: 4.03 Mil/uL — AB (ref 4.22–5.81)
RDW: 13.1 % (ref 11.5–15.5)
WBC: 4.8 10*3/uL (ref 4.0–10.5)

## 2017-04-06 LAB — LIPID PANEL
Cholesterol: 157 mg/dL (ref 0–200)
HDL: 69.7 mg/dL (ref >39.00)
LDL Cholesterol: 76 mg/dL (ref 0–99)
NONHDL: 86.98
Total CHOL/HDL Ratio: 2
Triglycerides: 55 mg/dL (ref 0.0–149.0)
VLDL: 11 mg/dL (ref 0.0–40.0)

## 2017-04-06 LAB — BASIC METABOLIC PANEL
BUN: 19 mg/dL (ref 6–23)
CO2: 25 meq/L (ref 19–32)
Calcium: 9.4 mg/dL (ref 8.4–10.5)
Chloride: 103 mEq/L (ref 96–112)
Creatinine, Ser: 0.98 mg/dL (ref 0.40–1.50)
GFR: 81.41 mL/min (ref >60.00)
GLUCOSE: 115 mg/dL — AB (ref 70–99)
POTASSIUM: 4.2 meq/L (ref 3.5–5.1)
SODIUM: 134 meq/L — AB (ref 135–145)

## 2017-04-06 LAB — HEPATIC FUNCTION PANEL
ALBUMIN: 4.4 g/dL (ref 3.5–5.2)
ALK PHOS: 36 U/L — AB (ref 39–117)
ALT: 18 U/L (ref 0–53)
AST: 15 U/L (ref 0–37)
Bilirubin, Direct: 0.1 mg/dL (ref 0.0–0.3)
TOTAL PROTEIN: 6.8 g/dL (ref 6.0–8.3)
Total Bilirubin: 0.5 mg/dL (ref 0.2–1.2)

## 2017-04-06 LAB — PSA: PSA: 0.98 ng/mL (ref 0.10–4.00)

## 2017-04-06 LAB — TSH: TSH: 2.2 u[IU]/mL (ref 0.35–4.50)

## 2017-04-29 ENCOUNTER — Other Ambulatory Visit: Payer: Self-pay | Admitting: Internal Medicine

## 2017-05-06 ENCOUNTER — Telehealth: Payer: Self-pay | Admitting: Internal Medicine

## 2017-05-06 MED ORDER — OMEPRAZOLE 40 MG PO CPDR: 40.0000 mg | DELAYED_RELEASE_CAPSULE | Freq: Two times a day (BID) | ORAL | 3 refills | Status: DC

## 2017-05-06 NOTE — Telephone Encounter (Signed)
Omeprazole refilled

## 2017-05-27 ENCOUNTER — Observation Stay (HOSPITAL_COMMUNITY)
Admission: EM | Admit: 2017-05-27 | Discharge: 2017-05-28 | Disposition: A | Discharge status: Home / Self Care | Payer: Medicare HMO | Attending: Family Medicine | Admitting: Family Medicine

## 2017-05-27 ENCOUNTER — Ambulatory Visit (INDEPENDENT_AMBULATORY_CARE_PROVIDER_SITE_OTHER)
Admission: RE | Admit: 2017-05-27 | Discharge: 2017-05-27 | Disposition: A | Discharge status: Home / Self Care | Payer: Medicare HMO | Source: Ambulatory Visit | Attending: Internal Medicine | Admitting: Internal Medicine

## 2017-05-27 ENCOUNTER — Ambulatory Visit (INDEPENDENT_AMBULATORY_CARE_PROVIDER_SITE_OTHER): Payer: Medicare HMO | Admitting: Internal Medicine

## 2017-05-27 ENCOUNTER — Emergency Department (HOSPITAL_COMMUNITY): Payer: Medicare HMO

## 2017-05-27 ENCOUNTER — Encounter (HOSPITAL_COMMUNITY): Payer: Self-pay | Admitting: Emergency Medicine

## 2017-05-27 ENCOUNTER — Encounter: Payer: Self-pay | Admitting: Internal Medicine

## 2017-05-27 ENCOUNTER — Other Ambulatory Visit (INDEPENDENT_AMBULATORY_CARE_PROVIDER_SITE_OTHER): Payer: Medicare HMO

## 2017-05-27 VITALS — BP 130/80 | HR 49 | Temp 98.6°F | Ht 67.0 in | Wt 168.0 lb

## 2017-05-27 DIAGNOSIS — R079 Chest pain, unspecified: Secondary | ICD-10-CM

## 2017-05-27 DIAGNOSIS — I10 Essential (primary) hypertension: Secondary | ICD-10-CM | POA: Diagnosis not present

## 2017-05-27 DIAGNOSIS — Z79899 Other long term (current) drug therapy: Secondary | ICD-10-CM | POA: Insufficient documentation

## 2017-05-27 DIAGNOSIS — Z7982 Long term (current) use of aspirin: Secondary | ICD-10-CM | POA: Insufficient documentation

## 2017-05-27 DIAGNOSIS — R42 Dizziness and giddiness: Secondary | ICD-10-CM | POA: Diagnosis not present

## 2017-05-27 DIAGNOSIS — E785 Hyperlipidemia, unspecified: Secondary | ICD-10-CM | POA: Diagnosis present

## 2017-05-27 DIAGNOSIS — Z87891 Personal history of nicotine dependence: Secondary | ICD-10-CM | POA: Insufficient documentation

## 2017-05-27 DIAGNOSIS — R001 Bradycardia, unspecified: Secondary | ICD-10-CM | POA: Diagnosis present

## 2017-05-27 DIAGNOSIS — G20 Parkinson's disease: Secondary | ICD-10-CM | POA: Diagnosis not present

## 2017-05-27 DIAGNOSIS — R0789 Other chest pain: Secondary | ICD-10-CM | POA: Insufficient documentation

## 2017-05-27 DIAGNOSIS — G20A1 Parkinson's disease without dyskinesia, without mention of fluctuations: Secondary | ICD-10-CM | POA: Diagnosis present

## 2017-05-27 HISTORY — DX: Hypo-osmolality and hyponatremia: E87.1

## 2017-05-27 LAB — I-STAT TROPONIN, ED: Troponin i, poc: 0 ng/mL (ref 0.00–0.08)

## 2017-05-27 LAB — BASIC METABOLIC PANEL
Anion gap: 9 (ref 5–15)
BUN: 22 mg/dL (ref 6–23)
BUN: 19 mg/dL (ref 6–20)
CALCIUM: 9.5 mg/dL (ref 8.4–10.5)
CO2: 25 mEq/L (ref 19–32)
CO2: 21 mmol/L — ABNORMAL LOW (ref 22–32)
CREATININE: 0.96 mg/dL (ref 0.40–1.50)
Calcium: 9.1 mg/dL (ref 8.9–10.3)
Chloride: 98 mEq/L (ref 96–112)
Chloride: 100 mmol/L — ABNORMAL LOW (ref 101–111)
Creatinine, Ser: 0.96 mg/dL (ref 0.61–1.24)
GFR: 83.33 mL/min (ref >60.00)
GLUCOSE: 104 mg/dL — AB (ref 70–99)
Glucose, Bld: 99 mg/dL (ref 65–99)
POTASSIUM: 4 mmol/L (ref 3.5–5.1)
Potassium: 4.2 mEq/L (ref 3.5–5.1)
SODIUM: 130 meq/L — AB (ref 135–145)
SODIUM: 130 mmol/L — AB (ref 135–145)

## 2017-05-27 LAB — CBC WITH DIFFERENTIAL/PLATELET
BASOS ABS: 0 10*3/uL (ref 0.0–0.1)
Basophils Relative: 0.8 % (ref 0.0–3.0)
EOS ABS: 0.1 10*3/uL (ref 0.0–0.7)
Eosinophils Relative: 3.1 % (ref 0.0–5.0)
HCT: 40 % (ref 39.0–52.0)
Hemoglobin: 13.6 g/dL (ref 13.0–17.0)
LYMPHS ABS: 0.6 10*3/uL — AB (ref 0.7–4.0)
Lymphocytes Relative: 14.8 % (ref 12.0–46.0)
MCHC: 33.9 g/dL (ref 30.0–36.0)
MCV: 97.8 fl (ref 78.0–100.0)
MONOS PCT: 11.1 % (ref 3.0–12.0)
Monocytes Absolute: 0.5 10*3/uL (ref 0.1–1.0)
NEUTROS PCT: 70.2 % (ref 43.0–77.0)
Neutro Abs: 2.9 10*3/uL (ref 1.4–7.7)
PLATELETS: 190 10*3/uL (ref 150.0–400.0)
RBC: 4.09 Mil/uL — AB (ref 4.22–5.81)
RDW: 12.6 % (ref 11.5–15.5)
WBC: 4.2 10*3/uL (ref 4.0–10.5)

## 2017-05-27 LAB — HEPATIC FUNCTION PANEL
ALBUMIN: 4.4 g/dL (ref 3.5–5.2)
ALT: 19 U/L (ref 0–53)
AST: 16 U/L (ref 0–37)
Alkaline Phosphatase: 37 U/L — ABNORMAL LOW (ref 39–117)
BILIRUBIN DIRECT: 0.1 mg/dL (ref 0.0–0.3)
TOTAL PROTEIN: 7 g/dL (ref 6.0–8.3)
Total Bilirubin: 0.7 mg/dL (ref 0.2–1.2)

## 2017-05-27 LAB — CBC
HCT: 38.6 % — ABNORMAL LOW (ref 39.0–52.0)
Hemoglobin: 13.5 g/dL (ref 13.0–17.0)
MCH: 32.5 pg (ref 26.0–34.0)
MCHC: 35 g/dL (ref 30.0–36.0)
MCV: 92.8 fL (ref 78.0–100.0)
PLATELETS: 187 10*3/uL (ref 150–400)
RBC: 4.16 MIL/uL — AB (ref 4.22–5.81)
RDW: 11.9 % (ref 11.5–15.5)
WBC: 4.8 10*3/uL (ref 4.0–10.5)

## 2017-05-27 LAB — CREATININE KINASE MB: CK-MB: 7.8 ng/mL — ABNORMAL HIGH (ref 0.3–4.0)

## 2017-05-27 MED ORDER — NITROGLYCERIN 0.4 MG SL SUBL: 0.4000 mg | SUBLINGUAL_TABLET | SUBLINGUAL | Status: DC | PRN

## 2017-05-27 MED ORDER — ASPIRIN 81 MG PO CHEW
324.0000 mg | CHEWABLE_TABLET | Freq: Once | ORAL | Status: DC
  Filled 2017-05-27: qty 4

## 2017-05-27 MED ORDER — VITAMIN D3 50 MCG (2000 UT) PO CAPS: 2000.0000 [IU] | ORAL_CAPSULE | Freq: Every day | ORAL | 3 refills | Status: DC

## 2017-05-27 MED ORDER — B COMPLEX PO TABS: 1.0000 | ORAL_TABLET | Freq: Every day | ORAL | 3 refills | Status: DC

## 2017-05-27 NOTE — Assessment & Plan Note (Addendum)
?  chronic ?worse  5:30 PM: CK MB is elevated. I spoke with Clair Gulling - no CP now. He will go to [facility] ER now. I spoke w/ER triage RN.

## 2017-05-27 NOTE — H&P (Addendum)
History and Physical    William [name] VFI:[id number] DOB: 1951/05/17 DOA: 05/27/2017  Referring MD/NP/PA: Dr. Leonides Schanz  PCP: Cassandria Anger, MD  Patient coming from: Home   Chief Complaint: Chest pain  HPI: William [name] is a 66 y.o. male with medical history significant of HTN, Parkinson's disease, ulcerative colitis s/p total abdominal colectomy, and gerd; who presents with complaints of left upper chest pain. Describes the pain as a tightness sensation that goes into the left shoulder area. Symptoms started last night after patient states eating a large meal. He attributed symptoms initially to possibly just indigestion. However, this morning patient notes similar symptoms for which he went to his primary care doctor and was evaluated. Patient admits to working out 30 minutes/day at least 4-6 times a week utilizing aerobic machines and free weights. Following his office visit lab work was obtained, and he is called around 6 PM being instructed to come to the hospital for further evaluation as his CK-MB was elevated at 7.8. Patient reports having possible stress test many years ago that was negative.   ED Course: Upon admission into the emergency department patient was seen to be afebrile, heart is 49-64, blood pressure 130/80-171/61, and all other vital signs within normal limits. Labs revealed sodium 1:30, chloride 100, CO2 21, troponin 0. EKG showed no significant ischemic changes. Patient had taken 162 mg of aspirin prior to coming. He is given an additional 162 mg of aspirin while in the ED.   Review of Systems  Constitutional: Negative for chills, fever and weight loss.  HENT: Negative for ear discharge and nosebleeds.   Eyes: Negative for double vision and photophobia.  Respiratory: Negative for hemoptysis and sputum production.   Cardiovascular: Positive for chest pain. Negative for leg swelling.  Gastrointestinal: Positive for nausea. Negative for abdominal pain, blood in stool,  constipation, diarrhea and vomiting.  Genitourinary: Negative for hematuria and urgency.  Musculoskeletal: Positive for myalgias. Negative for back pain and neck pain.  Skin: Negative for itching and rash.  Neurological: Negative for speech change and focal weakness.  Endo/Heme/Allergies: Negative for polydipsia. Does not bruise/bleed easily.  Psychiatric/Behavioral: Negative for hallucinations. The patient does not have insomnia.     Past Medical History:  Diagnosis Date  . Allergic rhinitis   . Anemia, iron deficiency   . Arthritis   . Blood transfusion without reported diagnosis   . Colonic stricture ([place])   . ED (erectile dysfunction)   . Esophageal stricture    Status post upper endoscopy with esophageal dilatation  . Gastric polyps   . GERD (gastroesophageal reflux disease)   . Hiatal hernia   . Hypertension   . Inguinal hernia   . Kidney stone    x 2  . Parkinson disease ([place])   . Ulcerative colitis    status post total abdominal colectomy with ileoanal anastomosis    Past Surgical History:  Procedure Laterality Date  . COLECTOMY  1992   with ilial anal pull through (for ulcerative colitis)  . ETHMOIDECTOMY     and maxillary enterostomies '04, redo in 12/04  . INGUINAL HERNIA REPAIR    . KIDNEY STONE SURGERY     removed 2013  . KNEE ARTHROSCOPY Right 2004  . LASIK       reports that he quit smoking about 37 years ago. He has never used smokeless tobacco. He reports that he drinks alcohol. He reports that he does not use drugs.  No Known Allergies  Family History  Problem Relation Age of Onset  . Cancer Father        rectal cancer and MM  . Rectal cancer Father   . Dementia Mother   . Hypertension Mother   . Heart disease Brother        valve repaired at 61  . Colon cancer Neg Hx   . Esophageal cancer Neg Hx   . Stomach cancer Neg Hx     Prior to Admission medications   Medication Sig Start Date End Date Taking? Authorizing Provider  aspirin  (ASPIRIN CHILDRENS) 81 MG chewable tablet Chew 1 tablet (81 mg total) by mouth daily. 10/23/14   Plotnikov, Evie Lacks, MD  b complex vitamins tablet Take 1 tablet by mouth daily. 05/27/17   Plotnikov, Evie Lacks, MD  Cholecalciferol (VITAMIN D3) 2000 units capsule Take 1 capsule (2,000 Units total) by mouth daily. 05/27/17   Plotnikov, Evie Lacks, MD  ferrous sulfate 325 (65 FE) MG tablet Take 325 mg by mouth daily with breakfast.      [provider]  NEUPRO 4 MG/24HR place 1 patch ONTO THE SKIN once daily 12/31/16   Tat, Eustace Quail, DO  omeprazole (PRILOSEC) 40 MG capsule Take 1 capsule (40 mg total) by mouth 2 (two) times daily. 05/06/17   Irene Shipper, MD  psyllium (METAMUCIL) 58.6 % powder Take 1 packet by mouth 2 (two) times daily. 1 TBS Twice Daily.    [provider]  Sildenafil Citrate (VIAGRA PO) Take by mouth. Reported on 11/12/2015    [provider]    Physical Exam:  Constitutional: NAD, calm, comfortable Vitals:   05/27/17 1812 05/27/17 2252  BP: 135/75 (!) 171/61  Pulse: 62 64  Resp: 14 13  Temp: 98.4 F (36.9 C)   TempSrc: Oral   SpO2: 98% 100%   Eyes: PERRL, lids and conjunctivae normal ENMT: Mucous membranes are moist. Posterior pharynx clear of any exudate or lesions.Normal dentition.  Neck: normal, supple, no masses, no thyromegaly Respiratory: clear to auscultation bilaterally, no wheezing, no crackles. Normal respiratory effort. No accessory muscle use.  Cardiovascular: Regular rate and rhythm, no murmurs / rubs / gallops. No extremity edema. 2+ pedal pulses. No carotid bruits. Chest tightness appears reproducible to palpation of the upper left chest wall. Abdomen: no tenderness, no masses palpated. No hepatosplenomegaly. Bowel sounds positive.  Musculoskeletal: no clubbing / cyanosis. No joint deformity upper and lower extremities. Good ROM, no contractures. Normal muscle tone.  Skin: no rashes, lesions, ulcers. No induration Neurologic: CN  2-12 grossly intact. Sensation intact, DTR normal. Strength 5/5 in all 4.  Psychiatric: Normal judgment and insight. Alert and oriented x 3. Normal mood.     Labs on Admission: I have personally reviewed following labs and imaging studies  CBC:  Recent Labs Lab 05/27/17 1138 05/27/17 1816  WBC 4.2 4.8  NEUTROABS 2.9  --   HGB 13.6 13.5  HCT 40.0 38.6*  MCV 97.8 92.8  PLT 190.0 846   Basic Metabolic Panel:  Recent Labs Lab 05/27/17 1138 05/27/17 1816  NA 130* 130*  K 4.2 4.0  CL 98 100*  CO2 25 21*  GLUCOSE 104* 99  BUN 22 19  CREATININE 0.96 0.96  CALCIUM 9.5 9.1   GFR: Estimated Creatinine Clearance: 71.7 mL/min (by C-G formula based on SCr of 0.96 mg/dL). Liver Function Tests:  Recent Labs Lab 05/27/17 1138  AST 16  ALT 19  ALKPHOS 37*  BILITOT 0.7  PROT 7.0  ALBUMIN 4.4  No results for input(s): LIPASE, AMYLASE in the last 168 hours. No results for input(s): AMMONIA in the last 168 hours. Coagulation Profile: No results for input(s): INR, PROTIME in the last 168 hours. Cardiac Enzymes:  Recent Labs Lab 05/27/17 1138  CKMB 7.8*   BNP (last 3 results) No results for input(s): PROBNP in the last 8760 hours. HbA1C: No results for input(s): HGBA1C in the last 72 hours. CBG: No results for input(s): GLUCAP in the last 168 hours. Lipid Profile: No results for input(s): CHOL, HDL, LDLCALC, TRIG, CHOLHDL, LDLDIRECT in the last 72 hours. Thyroid Function Tests: No results for input(s): TSH, T4TOTAL, FREET4, T3FREE, THYROIDAB in the last 72 hours. Anemia Panel: No results for input(s): VITAMINB12, FOLATE, FERRITIN, TIBC, IRON, RETICCTPCT in the last 72 hours. Urine analysis:    Component Value Date/Time   COLORURINE YELLOW 04/06/2017 0745   APPEARANCEUR CLEAR 04/06/2017 0745   LABSPEC 1.020 04/06/2017 0745   PHURINE 6.0 04/06/2017 0745   GLUCOSEU NEGATIVE 04/06/2017 0745   HGBUR NEGATIVE 04/06/2017 0745   BILIRUBINUR NEGATIVE 04/06/2017 0745     KETONESUR NEGATIVE 04/06/2017 0745   UROBILINOGEN 0.2 04/06/2017 0745   NITRITE NEGATIVE 04/06/2017 0745   LEUKOCYTESUR NEGATIVE 04/06/2017 0745   Sepsis Labs: No results found for this or any previous visit (from the past 240 hour(s)).   Radiological Exams on Admission: Dg Chest 2 View  Result Date: 05/27/2017 CLINICAL DATA:  Chest pressure radiating into right shoulder. EXAM: CHEST  2 VIEW COMPARISON:  05/27/2017 FINDINGS: The heart size and mediastinal contours are within normal limits. Both lungs are clear. The visualized skeletal structures are unremarkable. IMPRESSION: No active cardiopulmonary disease. Electronically Signed   By: Kerby Moors M.D.   On: 05/27/2017 18:47   Dg Chest 2 View  Result Date: 05/27/2017 CLINICAL DATA:  Left chest pain EXAM: CHEST  2 VIEW COMPARISON:  10/23/2009 FINDINGS: The heart size and mediastinal contours are within normal limits. Both lungs are clear. The visualized skeletal structures are unremarkable. IMPRESSION: No active cardiopulmonary disease. Electronically Signed   By: Jerilynn Mages.  Shick M.D.   On: 05/27/2017 11:53    EKG: Independently reviewed. Normal sinus rhythm at 61 bpm with left axis deviation   Assessment/Plan  Chest pain: Acute. Patient gives history of left upper chest tightness since yesterday evening. Patient's symptoms appear to be reproducible with palpation. Heart score 5. Differential includes costochondritis vs. underlying cardiac issue. - Admit to telemetry bed - Trend cardiac troponins - Check echocardiogram in a.m.  - Recheck EKG in a.m. - Will need to consult Cardiology in the a.m.  Parkinson disease - Continue rotigotine  Essential hypertension: Patient currently not on any antihypertensive medications. Initially blood pressure seen as high as 171/61, but could be do to acute stress.  - Hydralazine IV prn sBP >180  Hyperlipidemia: Last lipid panel obtained on 04/06/2017 showed total cholesterol 157, HDL 69, LDL 76, and  triglycerides 55.  Hyponatremia: Chronic. Patient's initial sodium level 130 on admission. Patient has had chronically low sodium levels seen as far back as 2013.  DVT prophylaxis: Lovenox  Code Status: Full Family Communication: No family present Disposition Plan: Likely discharge home if work-up negative  Consults called: None  Admission status: Observation  Norval Morton MD [facility] Pager [phone]   If 7PM-7AM, please contact night-coverage [email or web address] Password [email or web address]  05/27/2017, 11:47 PM

## 2017-05-27 NOTE — Assessment & Plan Note (Addendum)
EKG Labs CXR Go to ER if worse Re-start ASA  5:30 PM: CK MB is elevated. I spoke with William [name] - no CP now. He will go to [facility] ER now. I spoke w/ER triage RN.

## 2017-05-27 NOTE — Patient Instructions (Addendum)
Go to ER  If worse Pepcid complete 1 twice a day

## 2017-05-27 NOTE — ED Provider Notes (Signed)
TIME SEEN: 11:25 PM  CHIEF COMPLAINT: Chest pain  HPI: Patient is a 66 year old male with history of Parkinson's, hypertension, GERD who presents to the emergency department with left shoulder pain that started yesterday and started having chest tightness today in the left side of his chest. Has had associated nausea and fatigue. No shortness of breath, diaphoresis or dizziness. States after exercising yesterday he did feel more fatigued than normal and developed left shoulder pain. Shoulder does not hurt with movement. Pain is not reproduced with palpation. No injury to the shoulder. He saw his primary care physician Dr. Alain Marion today and had blood work drawn and was found to have an elevated CK-MB. Troponin was not checked at that time. Patient was instructed to come to the emergency department. He states he has not had a stress test in many years. No history of cardiac catheterization. No family history of coronary artery disease. Does have a identical 31 Brother who had to have a valve replacement.  ROS: See HPI Constitutional: no fever  Eyes: no drainage  ENT: no runny nose   Cardiovascular:   chest pain  Resp: no SOB  GI: no vomiting GU: no dysuria Integumentary: no rash  Allergy: no hives  Musculoskeletal: no leg swelling  Neurological: no slurred speech ROS otherwise negative  PAST MEDICAL HISTORY/PAST SURGICAL HISTORY:  Past Medical History:  Diagnosis Date  . Allergic rhinitis   . Anemia, iron deficiency   . Arthritis   . Blood transfusion without reported diagnosis   . Colonic stricture ([place])   . ED (erectile dysfunction)   . Esophageal stricture    Status post upper endoscopy with esophageal dilatation  . Gastric polyps   . GERD (gastroesophageal reflux disease)   . Hiatal hernia   . Hypertension   . Inguinal hernia   . Kidney stone    x 2  . Parkinson disease ([place])   . Ulcerative colitis    status post total abdominal colectomy with ileoanal anastomosis     MEDICATIONS:  Prior to Admission medications   Medication Sig Start Date End Date Taking? Authorizing Provider  aspirin (ASPIRIN CHILDRENS) 81 MG chewable tablet Chew 1 tablet (81 mg total) by mouth daily. 10/23/14   Plotnikov, Evie Lacks, MD  b complex vitamins tablet Take 1 tablet by mouth daily. 05/27/17   Plotnikov, Evie Lacks, MD  Cholecalciferol (VITAMIN D3) 2000 units capsule Take 1 capsule (2,000 Units total) by mouth daily. 05/27/17   Plotnikov, Evie Lacks, MD  ferrous sulfate 325 (65 FE) MG tablet Take 325 mg by mouth daily with breakfast.      [provider]  NEUPRO 4 MG/24HR place 1 patch ONTO THE SKIN once daily 12/31/16   Tat, Eustace Quail, DO  omeprazole (PRILOSEC) 40 MG capsule Take 1 capsule (40 mg total) by mouth 2 (two) times daily. 05/06/17   Irene Shipper, MD  psyllium (METAMUCIL) 58.6 % powder Take 1 packet by mouth 2 (two) times daily. 1 TBS Twice Daily.    [provider]  Sildenafil Citrate (VIAGRA PO) Take by mouth. Reported on 11/12/2015    [provider]    ALLERGIES:  No Known Allergies  SOCIAL HISTORY:  Social History  Substance Use Topics  . Smoking status: Former Smoker    Quit date: 09/14/1979  . Smokeless tobacco: Never Used  . Alcohol use 0.0 oz/week     Comment: glass wine a night/2 drinks weekend    FAMILY HISTORY: Family History  Problem Relation  Age of Onset  . Cancer Father        rectal cancer and MM  . Rectal cancer Father   . Dementia Mother   . Hypertension Mother   . Heart disease Brother        valve repaired at 78  . Colon cancer Neg Hx   . Esophageal cancer Neg Hx   . Stomach cancer Neg Hx     EXAM: BP (!) 171/61 (BP Location: Right Arm)   Pulse 64   Temp 98.4 F (36.9 C) (Oral)   Resp 13   SpO2 100%  CONSTITUTIONAL: Alert and oriented and responds appropriately to questions. Well-appearing; well-nourished, Elderly HEAD: Normocephalic EYES: Conjunctivae clear, pupils appear equal, EOMI ENT: normal  nose; moist mucous membranes NECK: Supple, no meningismus, no nuchal rigidity, no LAD  CARD: RRR; S1 and S2 appreciated; no murmurs, no clicks, no rubs, no gallops RESP: Normal chest excursion without splinting or tachypnea; breath sounds clear and equal bilaterally; no wheezes, no rhonchi, no rales, no hypoxia or respiratory distress, speaking full sentences ABD/GI: Normal bowel sounds; non-distended; soft, non-tender, no rebound, no guarding, no peritoneal signs, no hepatosplenomegaly BACK:  The back appears normal and is non-tender to palpation, there is no CVA tenderness EXT: Normal ROM in all joints; non-tender to palpation; no edema; normal capillary refill; no cyanosis, no calf tenderness or swelling    SKIN: Normal color for age and race; warm; no rash NEURO: Moves all extremities equally PSYCH: The patient's mood and manner are appropriate. Grooming and personal hygiene are appropriate.  MEDICAL DECISION MAKING: Patient here with chest pain. He has a heart score 5 with no recent provocative testing. I have recommended admission. Troponin here is negative. Chest x-ray clear. No cardiomegaly or widened mediastinum. Will give aspirin, nitroglycerin. Doubt dissection or PE. He has new incomplete right bundle branch block on EKG. Will discuss with medicine for admission.  ED PROGRESS:   12:02 AM Discussed patient's case with [facility], Dr. Tamala Julian.  I have recommended admission and patient (and family if present) agree with this plan. Admitting physician will place admission orders.   I reviewed all nursing notes, vitals, pertinent previous records, EKGs, lab and urine results, imaging (as available).       EKG Interpretation  Date/Time:  Friday May 27 2017 18:10:06 EDT Ventricular Rate:  61 PR Interval:  202 QRS Duration: 108 QT Interval:  428 QTC Calculation: 430 R Axis:   -47 Text Interpretation:  Normal sinus rhythm Left axis deviation Incomplete right bundle branch block  Abnormal ECG Confirmed by Pryor Curia [phone]) on 05/27/2017 11:11:56 PM         Ward, Delice Bison, DO 05/28/17 0002

## 2017-05-27 NOTE — ED Triage Notes (Signed)
Pt reports left arm pain that progressed to left chest pain beginning last night, saw pcp today, had labs done, pcp called stating pts CKMB was elevated. Pt a/ox4, resp e/u, nad.

## 2017-05-27 NOTE — Progress Notes (Signed)
Subjective:  Patient ID: William [name], male    DOB: 01-31-1951  Age: 66 y.o. MRN: [id number]  CC: No chief complaint on file.   HPI William [name] presents for fatigue and CP William [name] was working out William [name], then had a heavy dinner and felt weak and had a L CP This am - felt the CP again, he has it now 3/10. C/o lightheadedness. New job, stress.Marland KitchenMarland KitchenHe ran out of ASA a few days ago...  Outpatient Medications Prior to Visit  Medication Sig Dispense Refill  . aspirin (ASPIRIN CHILDRENS) 81 MG chewable tablet Chew 1 tablet (81 mg total) by mouth daily. 36 tablet 11  . cetirizine (ZYRTEC) 10 MG tablet Take 1 tablet (10 mg total) by mouth at bedtime. 30 tablet 0  . ferrous sulfate 325 (65 FE) MG tablet Take 325 mg by mouth daily with breakfast.      . fluticasone (FLONASE) 50 MCG/ACT nasal spray Place 2 sprays into both nostrils daily. As directed 16 g 11  . NEUPRO 4 MG/24HR place 1 patch ONTO THE SKIN once daily 30 patch 5  . omeprazole (PRILOSEC) 40 MG capsule Take 1 capsule (40 mg total) by mouth 2 (two) times daily. 60 capsule 3  . psyllium (METAMUCIL) 58.6 % powder Take 1 packet by mouth 2 (two) times daily. 1 TBS Twice Daily.    . Sildenafil Citrate (VIAGRA PO) Take by mouth. Reported on 11/12/2015     No facility-administered medications prior to visit.     ROS Review of Systems  Constitutional: Positive for fatigue. Negative for appetite change and unexpected weight change.  HENT: Negative for congestion, nosebleeds, sneezing, sore throat and trouble swallowing.   Eyes: Negative for itching and visual disturbance.  Respiratory: Negative for cough.   Cardiovascular: Positive for chest pain. Negative for palpitations and leg swelling.  Gastrointestinal: Negative for abdominal distention, blood in stool, diarrhea and nausea.  Genitourinary: Negative for frequency and hematuria.  Musculoskeletal: Negative for back pain, gait problem, joint swelling and neck pain.  Skin: Negative for rash.    Neurological: Positive for tremors and light-headedness. Negative for dizziness, speech difficulty and weakness.  Psychiatric/Behavioral: Negative for agitation, dysphoric mood and sleep disturbance. The patient is not nervous/anxious.     Objective:  There were no vitals taken for this visit.  BP Readings from Last 3 Encounters:  04/04/17 132/82  02/01/17 132/74  12/22/16 130/78    Wt Readings from Last 3 Encounters:  04/04/17 167 lb (75.8 kg)  02/01/17 164 lb (74.4 kg)  12/22/16 163 lb (73.9 kg)    Physical Exam  Constitutional: He is oriented to person, place, and time. He appears well-developed. No distress.  NAD  HENT:  Mouth/Throat: Oropharynx is clear and moist.  Eyes: Pupils are equal, round, and reactive to light. Conjunctivae are normal.  Neck: Normal range of motion. No JVD present. No thyromegaly present.  Cardiovascular: Normal rate, regular rhythm, normal heart sounds and intact distal pulses.  Exam reveals no gallop and no friction rub.   No murmur heard. Pulmonary/Chest: Effort normal and breath sounds normal. No respiratory distress. He has no wheezes. He has no rales. He exhibits no tenderness.  Abdominal: Soft. Bowel sounds are normal. He exhibits no distension and no mass. There is no tenderness. There is no rebound and no guarding.  Musculoskeletal: Normal range of motion. He exhibits no edema or tenderness.  Lymphadenopathy:    He has no cervical adenopathy.  Neurological: He is alert and oriented  to person, place, and time. He has normal reflexes. No cranial nerve deficit. He exhibits normal muscle tone. He displays a negative Romberg sign. Coordination and gait normal.  Skin: Skin is warm and dry. No rash noted.  Psychiatric: He has a normal mood and affect. His behavior is normal. Judgment and thought content normal.    Procedure: EKG Indication: chest pain, lightheaded Impression: S brady. Occ ectopic beats. No acute changes.   Lab Results   Component Value Date   WBC 4.8 04/06/2017   HGB 13.5 04/06/2017   HCT 39.3 04/06/2017   PLT 200.0 04/06/2017   GLUCOSE 115 (H) 04/06/2017   CHOL 157 04/06/2017   TRIG 55.0 04/06/2017   HDL 69.70 04/06/2017   LDLCALC 76 04/06/2017   ALT 18 04/06/2017   AST 15 04/06/2017   NA 134 (L) 04/06/2017   K 4.2 04/06/2017   CL 103 04/06/2017   CREATININE 0.98 04/06/2017   BUN 19 04/06/2017   CO2 25 04/06/2017   TSH 2.20 04/06/2017   PSA 0.98 04/06/2017    Mr Face/trigeminal Wo/w Cm  Result Date: 11/02/2016 CLINICAL DATA:  Trigeminal neuralgia. Left-sided facial pain for 3 months. EXAM: MRI FACE TRIGEMINAL WITHOUT AND WITH CONTRAST TECHNIQUE: Multiplanar, multiecho pulse sequences of the face and surrounding structures, including thin slice imaging of the course of the Trigeminal Nerves, were obtained both before and after administration of intravenous contrast. CONTRAST:  15 mL MultiHance COMPARISON:  Limited coronal sinus CT 10/23/2009 FINDINGS: There is a vessel which appears to be the left AICA which crosses just inferior to the left trigeminal nerve near the root entry zone with question of subtle superolateral displacement of the nerve. The cisternal segment of the right trigeminal nerve is unremarkable. Floyd are normal in appearance bilaterally. No mass is identified along the course of the trigeminal nerves. The visualized portion of the brain is unremarkable. The orbits are unremarkable. Postsurgical changes are noted in the paranasal sinuses. There is mild bilateral maxillary sinus mucosal thickening and small volume fluid, with a mucous retention cyst on the left. There is mild bilateral ethmoid sinus mucosal thickening. The mastoid air cells are clear. IMPRESSION: 1. Crossing vessel (likely the left AICA) near the left trigeminal nerve root entry zone. 2. No mass. Electronically Signed   By: Logan Bores M.D.   On: 11/02/2016 13:26    Assessment & Plan:   There are no  diagnoses linked to this encounter. I am having William [name] maintain his ferrous sulfate, psyllium, Sildenafil Citrate (VIAGRA PO), aspirin, NEUPRO, fluticasone, cetirizine, and omeprazole.  No orders of the defined types were placed in this encounter.    Follow-up: No Follow-up on file.  Walker Kehr, MD

## 2017-05-28 ENCOUNTER — Encounter (HOSPITAL_COMMUNITY): Payer: Self-pay | Admitting: Physician Assistant

## 2017-05-28 ENCOUNTER — Observation Stay (HOSPITAL_BASED_OUTPATIENT_CLINIC_OR_DEPARTMENT_OTHER): Payer: Medicare HMO

## 2017-05-28 ENCOUNTER — Other Ambulatory Visit: Payer: Self-pay | Admitting: Physician Assistant

## 2017-05-28 DIAGNOSIS — G20 Parkinson's disease: Secondary | ICD-10-CM | POA: Diagnosis not present

## 2017-05-28 DIAGNOSIS — I351 Nonrheumatic aortic (valve) insufficiency: Secondary | ICD-10-CM

## 2017-05-28 DIAGNOSIS — R0789 Other chest pain: Secondary | ICD-10-CM | POA: Diagnosis not present

## 2017-05-28 DIAGNOSIS — E785 Hyperlipidemia, unspecified: Secondary | ICD-10-CM | POA: Diagnosis present

## 2017-05-28 DIAGNOSIS — R001 Bradycardia, unspecified: Secondary | ICD-10-CM | POA: Diagnosis not present

## 2017-05-28 DIAGNOSIS — I10 Essential (primary) hypertension: Secondary | ICD-10-CM

## 2017-05-28 DIAGNOSIS — I519 Heart disease, unspecified: Secondary | ICD-10-CM | POA: Diagnosis not present

## 2017-05-28 DIAGNOSIS — Z7982 Long term (current) use of aspirin: Secondary | ICD-10-CM | POA: Diagnosis not present

## 2017-05-28 DIAGNOSIS — R079 Chest pain, unspecified: Secondary | ICD-10-CM | POA: Diagnosis present

## 2017-05-28 DIAGNOSIS — I340 Nonrheumatic mitral (valve) insufficiency: Secondary | ICD-10-CM | POA: Diagnosis not present

## 2017-05-28 DIAGNOSIS — E871 Hypo-osmolality and hyponatremia: Secondary | ICD-10-CM | POA: Diagnosis not present

## 2017-05-28 DIAGNOSIS — Z87891 Personal history of nicotine dependence: Secondary | ICD-10-CM | POA: Diagnosis not present

## 2017-05-28 DIAGNOSIS — Z79899 Other long term (current) drug therapy: Secondary | ICD-10-CM | POA: Diagnosis not present

## 2017-05-28 LAB — CBC WITH DIFFERENTIAL/PLATELET
BASOS ABS: 0 10*3/uL (ref 0.0–0.1)
Basophils Relative: 1 %
EOS PCT: 5 %
Eosinophils Absolute: 0.2 10*3/uL (ref 0.0–0.7)
HCT: 35.7 % — ABNORMAL LOW (ref 39.0–52.0)
Hemoglobin: 12.3 g/dL — ABNORMAL LOW (ref 13.0–17.0)
LYMPHS PCT: 29 %
Lymphs Abs: 1.1 10*3/uL (ref 0.7–4.0)
MCH: 32.2 pg (ref 26.0–34.0)
MCHC: 34.5 g/dL (ref 30.0–36.0)
MCV: 93.5 fL (ref 78.0–100.0)
Monocytes Absolute: 0.3 10*3/uL (ref 0.1–1.0)
Monocytes Relative: 9 %
Neutro Abs: 2.1 10*3/uL (ref 1.7–7.7)
Neutrophils Relative %: 56 %
PLATELETS: 158 10*3/uL (ref 150–400)
RBC: 3.82 MIL/uL — ABNORMAL LOW (ref 4.22–5.81)
RDW: 12 % (ref 11.5–15.5)
WBC: 3.8 10*3/uL — ABNORMAL LOW (ref 4.0–10.5)

## 2017-05-28 LAB — BASIC METABOLIC PANEL
ANION GAP: 6 (ref 5–15)
BUN: 15 mg/dL (ref 6–20)
CHLORIDE: 101 mmol/L (ref 101–111)
CO2: 24 mmol/L (ref 22–32)
Calcium: 8.6 mg/dL — ABNORMAL LOW (ref 8.9–10.3)
Creatinine, Ser: 1.05 mg/dL (ref 0.61–1.24)
GFR calc Af Amer: >60 mL/min (ref >60)
Glucose, Bld: 114 mg/dL — ABNORMAL HIGH (ref 65–99)
POTASSIUM: 3.4 mmol/L — AB (ref 3.5–5.1)
Sodium: 131 mmol/L — ABNORMAL LOW (ref 135–145)

## 2017-05-28 LAB — TROPONIN I

## 2017-05-28 LAB — ECHOCARDIOGRAM COMPLETE
HEIGHTINCHES: 67 in
Weight: 2646.4 oz

## 2017-05-28 LAB — HIV ANTIBODY (ROUTINE TESTING W REFLEX): HIV Screen 4th Generation wRfx: NONREACTIVE

## 2017-05-28 MED ORDER — ENOXAPARIN SODIUM 40 MG/0.4ML ~~LOC~~ SOLN: 40.0000 mg | Freq: Every day | SUBCUTANEOUS | Status: DC

## 2017-05-28 MED ORDER — ONDANSETRON HCL 4 MG/2ML IJ SOLN: 4.0000 mg | Freq: Four times a day (QID) | INTRAMUSCULAR | Status: DC | PRN

## 2017-05-28 MED ORDER — ROTIGOTINE 4 MG/24HR TD PT24: 1.0000 | MEDICATED_PATCH | Freq: Every day | TRANSDERMAL | Status: DC

## 2017-05-28 MED ORDER — PSYLLIUM 95 % PO PACK
1.0000 | PACK | Freq: Two times a day (BID) | ORAL | Status: DC
  Administered 2017-05-28: 1 via ORAL
  Filled 2017-05-28: qty 1

## 2017-05-28 MED ORDER — PANTOPRAZOLE SODIUM 40 MG PO TBEC
40.0000 mg | DELAYED_RELEASE_TABLET | Freq: Every day | ORAL | Status: DC
  Administered 2017-05-28: 40 mg via ORAL
  Filled 2017-05-28: qty 1

## 2017-05-28 MED ORDER — SODIUM CHLORIDE 0.9 % IV SOLN: INTRAVENOUS | Status: DC

## 2017-05-28 MED ORDER — HYDRALAZINE HCL 20 MG/ML IJ SOLN: 10.0000 mg | INTRAMUSCULAR | Status: DC | PRN

## 2017-05-28 MED ORDER — ACETAMINOPHEN 325 MG PO TABS
650.0000 mg | ORAL_TABLET | ORAL | Status: DC | PRN
  Administered 2017-05-28: 650 mg via ORAL
  Filled 2017-05-28: qty 2

## 2017-05-28 MED ORDER — MORPHINE SULFATE (PF) 4 MG/ML IV SOLN: 2.0000 mg | INTRAVENOUS | Status: DC | PRN

## 2017-05-28 MED ORDER — GI COCKTAIL ~~LOC~~: 30.0000 mL | Freq: Four times a day (QID) | ORAL | Status: DC | PRN

## 2017-05-28 MED ORDER — FERROUS SULFATE 325 (65 FE) MG PO TABS
325.0000 mg | ORAL_TABLET | Freq: Every day | ORAL | Status: DC
  Filled 2017-05-28: qty 1

## 2017-05-28 MED ORDER — AMLODIPINE BESYLATE 5 MG PO TABS: 5.0000 mg | ORAL_TABLET | Freq: Every day | ORAL | 0 refills | Status: DC

## 2017-05-28 MED ORDER — ASPIRIN 81 MG PO CHEW
81.0000 mg | CHEWABLE_TABLET | Freq: Every day | ORAL | Status: DC
  Administered 2017-05-28: 81 mg via ORAL
  Filled 2017-05-28: qty 1

## 2017-05-28 MED ORDER — ASPIRIN 81 MG PO CHEW
162.0000 mg | CHEWABLE_TABLET | Freq: Once | ORAL | Status: AC
  Administered 2017-05-28: 162 mg via ORAL

## 2017-05-28 NOTE — Discharge Instructions (Signed)
Follow with Primary MD  Plotnikov, Evie Lacks, MD  and other consultant's as instructed your Hospitalist MD  Please get a complete blood count and chemistry panel checked by your Primary MD at your next visit, and again as instructed by your Primary MD.  Get Medicines reviewed and adjusted: Please take all your medications with you for your next visit with your Primary MD  Laboratory/radiological data: Please request your Primary MD to go over all hospital tests and procedure/radiological results at the follow up, please ask your Primary MD to get all Hospital records sent to his/her office.  In some cases, they will be blood work, cultures and biopsy results pending at the time of your discharge. Please request that your primary care M.D. follows up on these results.  Also Note the following: If you experience worsening of your admission symptoms, develop shortness of breath, life threatening emergency, suicidal or homicidal thoughts you must seek medical attention immediately by calling 911 or calling your MD immediately  if symptoms less severe.  You must read complete instructions/literature along with all the possible adverse reactions/side effects for all the Medicines you take and that have been prescribed to you. Take any new Medicines after you have completely understood and accpet all the possible adverse reactions/side effects.   Do not drive when taking Pain medications or sleeping medications (Benzodaizepines)  Do not take more than prescribed Pain, Sleep and Anxiety Medications. It is not advisable to combine anxiety,sleep and pain medications without talking with your primary care practitioner  Special Instructions: If you have smoked or chewed Tobacco  in the last 2 yrs please stop smoking, stop any regular Alcohol  and or any Recreational drug use.  Wear Seat belts while driving.  Please note: You were cared for by a hospitalist during your hospital stay. Once you are  discharged, your primary care physician will handle any further medical issues. Please note that NO REFILLS for any discharge medications will be authorized once you are discharged, as it is imperative that you return to your primary care physician (or establish a relationship with a primary care physician if you do not have one) for your post hospital discharge needs so that they can reassess your need for medications and monitor your lab values.

## 2017-05-28 NOTE — Progress Notes (Signed)
  Echocardiogram 2D Echocardiogram has been performed.  William [name] 05/28/2017, 1:27 PM

## 2017-05-28 NOTE — Progress Notes (Addendum)
Per d/w Dr. Bronson Ing, plan OP f/u. Pt no longer wishes to stay as inpt. I have sent a message to our [facility] office's scheduler requesting a follow-up appointment, and our office will call the patient with this information.  Addendum: echo has been done as inpatient, have updated scheduler msg that pt needs f/u only.  Raylon Lamson PA-C

## 2017-05-28 NOTE — ED Notes (Signed)
Attempted report x1. 

## 2017-05-28 NOTE — Discharge Summary (Addendum)
Physician Discharge Summary  BAIRON KLEMANN JHE:[id number] DOB: January 04, 1951 DOA: 05/27/2017  PCP: Cassandria Anger, MD  Admit date: 05/27/2017 Discharge date: 05/28/2017  Admitted From: Home  Disposition: Home   Recommendations for Outpatient Follow-up:  1. Follow up with PCP in 1 weeks 2. Follow up with cardiology in 2 weeks to arrange outpatient stress testing 3. Please follow up echocardiogram results 4. Please monitor blood pressure and adjust therapy as needed 5. Please check TSH outpatient on follow up  Discharge Condition: STABLE   CODE STATUS: FULL    Brief Hospitalization Summary: Please see all hospital notes, images, labs for full details of the hospitalization.  HPI: William [name] is a 66 y.o. male with medical history significant of HTN, Parkinson's disease, ulcerative colitis s/p total abdominal colectomy, and gerd; who presents with complaints of left upper chest pain. Describes the pain as a tightness sensation that goes into the left shoulder area. Symptoms started last night after patient states eating a large meal. He attributed symptoms initially to possibly just indigestion. However, this morning patient notes similar symptoms for which he went to his primary care doctor and was evaluated. Patient admits to working out 30 minutes/day at least 4-6 times a week utilizing aerobic machines and free weights. Following his office visit lab work was obtained, and he is called around 6 PM being instructed to come to the hospital for further evaluation as his CK-MB was elevated at 7.8. Patient reports having possible stress test many years ago that was negative.   ED Course: Upon admission into the emergency department patient was seen to be afebrile, heart is 49-64, blood pressure 130/80-171/61, and all other vital signs within normal limits. Labs revealed sodium 1:30, chloride 100, CO2 21, troponin 0. EKG showed no significant ischemic changes. Patient had taken 162 mg of  aspirin prior to coming. He is given an additional 162 mg of aspirin while in the ED.  Assessment/Plan  Atypical chest pain/pressure - Pt says his symptoms are resolved now.  I had consulted cardiology to see him but patient says that he doesn't want to wait in hospital any longer.  He says that he will follow up outpatient.  His troponins have been negative x 3 and his telemetry has been stable and no acute findings.  I will have patient follow up outpatient with [facility].  He did not want to wait for his echocardiogram so will recommend he have it done outpatient.  Risks discussed with patient and he verbalized understanding.    Parkinson disease  - Continue rotigotine  Essential hypertension: Patient currently not on any antihypertensive medications. Initially blood pressure seen as high as 171/61, but could be do to acute stress.  - BPs have been elevated in hospital. I will prescribe amlodipine 5 mg daily for blood pressure and have him follow up with his PCP.   Hyperlipidemia: Last lipid panel obtained on 04/06/2017 showed total cholesterol 157, HDL 69, LDL 76, and triglycerides 55.  Bradycardia - Pt did not want to wait to be seen by cardiology in hospital.  He will follow up with them outpatient.     Hyponatremia: Chronic. Patient's initial sodium level 130 on admission. Patient has had chronically low sodium levels seen as far back as 2013.  DVT prophylaxis: Lovenox  Code Status: Full Family Communication: wife present Disposition Plan: Home Consults called: cardiology  Admission status: Observation  Discharge Diagnoses:  Principal Problem:   Chest pain Active Problems:   Essential hypertension,  benign   PD (Parkinson's disease) ([place])   Hyperlipidemia  Discharge Instructions: Discharge Instructions    Call MD for:  difficulty breathing, headache or visual disturbances    Complete by:  As directed    Call MD for:  extreme fatigue    Complete by:  As directed     Call MD for:  persistant dizziness or light-headedness    Complete by:  As directed    Call MD for:  persistant nausea and vomiting    Complete by:  As directed    Call MD for:  severe uncontrolled pain    Complete by:  As directed    Increase activity slowly    Complete by:  As directed      Allergies as of 05/28/2017   No Known Allergies     Medication List    STOP taking these medications   b complex vitamins tablet   Vitamin D3 2000 units capsule     TAKE these medications   amLODipine 5 MG tablet Commonly known as:  NORVASC Take 1 tablet (5 mg total) by mouth daily.   aspirin 81 MG chewable tablet Commonly known as:  ASPIRIN CHILDRENS Chew 1 tablet (81 mg total) by mouth daily.   ferrous sulfate 325 (65 FE) MG tablet Take 325 mg by mouth daily with breakfast.   ibuprofen 200 MG tablet Commonly known as:  ADVIL,MOTRIN Take 400 mg by mouth every 6 (six) hours as needed for fever, mild pain or moderate pain.   MUCINEX PO Take 1 tablet by mouth as needed (Cough).   multivitamin with minerals Tabs tablet Take 1 tablet by mouth daily.   NEUPRO 4 MG/24HR Generic drug:  rotigotine place 1 patch ONTO THE SKIN once daily   omeprazole 40 MG capsule Commonly known as:  PRILOSEC Take 1 capsule (40 mg total) by mouth 2 (two) times daily.   PSEUDOEPHEDRINE-DM PO Take 2 tablets by mouth as needed (congestion).   psyllium 58.6 % powder Commonly known as:  METAMUCIL Take 1 packet by mouth 2 (two) times daily. 1 TBS Twice Daily.   sildenafil 20 MG tablet Commonly known as:  REVATIO Take 20-100 mg by mouth as needed (Intercourse).   VIAGRA PO Take by mouth. Reported on 11/12/2015            Discharge Care Instructions        Start     Ordered   05/28/17 0000  Increase activity slowly     05/28/17 1146   05/28/17 0000  Call MD for:  severe uncontrolled pain     05/28/17 1146   05/28/17 0000  Call MD for:  persistant nausea and vomiting     05/28/17  1146   05/28/17 0000  Call MD for:  difficulty breathing, headache or visual disturbances     05/28/17 1146   05/28/17 0000  Call MD for:  persistant dizziness or light-headedness     05/28/17 1146   05/28/17 0000  Call MD for:  extreme fatigue     05/28/17 1146   05/28/17 0000  amLODipine (NORVASC) 5 MG tablet  Daily     05/28/17 1154     Follow-up Information    Plotnikov, Evie Lacks, MD. Schedule an appointment as soon as possible for a visit in 1 week(s).   Specialty:  Internal Medicine Why:  Hospital Follow Up  Contact information: [place] [address] [phone]        [facility] [facility]. Schedule an appointment as soon as possible for a visit in 2 week(s).   Why:  Make appointment for hospital follow up  Contact information: [place] [address] [phone]         No Known Allergies Current Discharge Medication List    START taking these medications   Details  amLODipine (NORVASC) 5 MG tablet Take 1 tablet (5 mg total) by mouth daily. Qty: 30 tablet, Refills: 0      CONTINUE these medications which have NOT CHANGED   Details  aspirin (ASPIRIN CHILDRENS) 81 MG chewable tablet Chew 1 tablet (81 mg total) by mouth daily. Qty: 36 tablet, Refills: 11    ferrous sulfate 325 (65 FE) MG tablet Take 325 mg by mouth daily with breakfast.      GuaiFENesin (MUCINEX PO) Take 1 tablet by mouth as needed (Cough).    ibuprofen (ADVIL,MOTRIN) 200 MG tablet Take 400 mg by mouth every 6 (six) hours as needed for fever, mild pain or moderate pain.    Multiple Vitamin (MULTIVITAMIN WITH MINERALS) TABS tablet Take 1 tablet by mouth daily.    NEUPRO 4 MG/24HR place 1 patch ONTO THE SKIN once daily Qty: 30 patch, Refills: 5    omeprazole (PRILOSEC) 40 MG capsule Take 1 capsule (40 mg total) by mouth 2 (two) times daily. Qty: 60 capsule, Refills: 3    PSEUDOEPHEDRINE-DM PO  Take 2 tablets by mouth as needed (congestion).    psyllium (METAMUCIL) 58.6 % powder Take 1 packet by mouth 2 (two) times daily. 1 TBS Twice Daily.    sildenafil (REVATIO) 20 MG tablet Take 20-100 mg by mouth as needed (Intercourse).    Sildenafil Citrate (VIAGRA PO) Take by mouth. Reported on 11/12/2015      STOP taking these medications     b complex vitamins tablet      Cholecalciferol (VITAMIN D3) 2000 units capsule         Procedures/Studies: Dg Chest 2 View  Result Date: 05/27/2017 CLINICAL DATA:  Chest pressure radiating into right shoulder. EXAM: CHEST  2 VIEW COMPARISON:  05/27/2017 FINDINGS: The heart size and mediastinal contours are within normal limits. Both lungs are clear. The visualized skeletal structures are unremarkable. IMPRESSION: No active cardiopulmonary disease. Electronically Signed   By: Kerby Moors M.D.   On: 05/27/2017 18:47   Dg Chest 2 View  Result Date: 05/27/2017 CLINICAL DATA:  Left chest pain EXAM: CHEST  2 VIEW COMPARISON:  10/23/2009 FINDINGS: The heart size and mediastinal contours are within normal limits. Both lungs are clear. The visualized skeletal structures are unremarkable. IMPRESSION: No active cardiopulmonary disease. Electronically Signed   By: Jerilynn Mages.  Shick M.D.   On: 05/27/2017 11:53     Subjective: Pt says that he feels much better and he wants to go home.   Discharge Exam: Vitals:   05/28/17 0159 05/28/17 0500  BP: (!) 173/53 (!) 167/70  Pulse: (!) 48 (!) 49  Resp: 16 16  Temp: 98.8 F (37.1 C) 98 F (36.7 C)  SpO2: 100% 100%   Vitals:   05/27/17 2300 05/27/17 2330 05/28/17 0159 05/28/17 0500  BP: (!) 157/83 (!) 159/75 (!) 173/53 (!) 167/70  Pulse: (!) 54 (!) 49 (!) 48 (!) 49  Resp: 16 11 16 16   Temp:   98.8 F (37.1 C) 98 F (36.7 C)  TempSrc:   Oral Oral  SpO2: 100% 98% 100% 100%  Weight:   75 kg (  165 lb 6.4 oz)   Height:   5' 7"  (1.702 m)     General: Pt is alert, awake, not in acute distress Cardiovascular:  S1/S2 +, no rubs, no gallops Respiratory: CTA bilaterally, no wheezing, no rhonchi Abdominal: Soft, NT, ND, bowel sounds + Extremities: no edema, no cyanosis   The results of significant diagnostics from this hospitalization (including imaging, microbiology, ancillary and laboratory) are listed below for reference.     Microbiology: No results found for this or any previous visit (from the past 240 hour(s)).   Labs: BNP (last 3 results) No results for input(s): BNP in the last 8760 hours. Basic Metabolic Panel:  Recent Labs Lab 05/27/17 1138 05/27/17 1816 05/28/17 0231  NA 130* 130* 131*  K 4.2 4.0 3.4*  CL 98 100* 101  CO2 25 21* 24  GLUCOSE 104* 99 114*  BUN 22 19 15   CREATININE 0.96 0.96 1.05  CALCIUM 9.5 9.1 8.6*   Liver Function Tests:  Recent Labs Lab 05/27/17 1138  AST 16  ALT 19  ALKPHOS 37*  BILITOT 0.7  PROT 7.0  ALBUMIN 4.4   No results for input(s): LIPASE, AMYLASE in the last 168 hours. No results for input(s): AMMONIA in the last 168 hours. CBC:  Recent Labs Lab 05/27/17 1138 05/27/17 1816 05/28/17 0231  WBC 4.2 4.8 3.8*  NEUTROABS 2.9  --  2.1  HGB 13.6 13.5 12.3*  HCT 40.0 38.6* 35.7*  MCV 97.8 92.8 93.5  PLT 190.0 187 158   Cardiac Enzymes:  Recent Labs Lab 05/27/17 1138 05/28/17 0231 05/28/17 0540  CKMB 7.8*  --   --   TROPONINI  --  <0.03 <0.03   BNP: Invalid input(s): POCBNP CBG: No results for input(s): GLUCAP in the last 168 hours. D-Dimer No results for input(s): DDIMER in the last 72 hours. Hgb A1c No results for input(s): HGBA1C in the last 72 hours. Lipid Profile No results for input(s): CHOL, HDL, LDLCALC, TRIG, CHOLHDL, LDLDIRECT in the last 72 hours. Thyroid function studies No results for input(s): TSH, T4TOTAL, T3FREE, THYROIDAB in the last 72 hours.  Invalid input(s): FREET3 Anemia work up No results for input(s): VITAMINB12, FOLATE, FERRITIN, TIBC, IRON, RETICCTPCT in the last 72 hours. Urinalysis     Component Value Date/Time   COLORURINE YELLOW 04/06/2017 0745   APPEARANCEUR CLEAR 04/06/2017 0745   LABSPEC 1.020 04/06/2017 0745   PHURINE 6.0 04/06/2017 0745   GLUCOSEU NEGATIVE 04/06/2017 0745   HGBUR NEGATIVE 04/06/2017 0745   BILIRUBINUR NEGATIVE 04/06/2017 0745   KETONESUR NEGATIVE 04/06/2017 0745   UROBILINOGEN 0.2 04/06/2017 0745   NITRITE NEGATIVE 04/06/2017 0745   LEUKOCYTESUR NEGATIVE 04/06/2017 0745   Sepsis Labs Invalid input(s): PROCALCITONIN,  WBC,  LACTICIDVEN Microbiology No results found for this or any previous visit (from the past 240 hour(s)).  Time coordinating discharge:   SIGNED:  Irwin Brakeman, MD  [facility] 05/28/2017, 11:54 AM Pager [phone]  If 7PM-7AM, please contact night-coverage [email or web address] Password [email or web address]

## 2017-05-28 NOTE — Progress Notes (Signed)
Clarified with Dr. Wynetta Emery pt may do echo outpt. Carroll Kinds RN

## 2017-05-28 NOTE — Consult Note (Signed)
Cardiology Consultation:   Patient ID: William [name]; [id number]; 1951-07-23   Admit date: 05/27/2017 Date of Consult: 05/28/2017  Primary Care Provider: Cassandria Anger, MD Primary Cardiologist: New  Chief Complaint: chest pain  Patient Profile:   William [name] is a 66 y.o. male with a hx of HTN, Parkinson's, ulcerative colitis s/p total abdominal colectomy, GERD, habitual alcohol use (2 glasses of wine per night), former tobacco abuse (7 years), chronic appearing hyponatremia, hiatal hernia, arthritis, iron deficiency anemia  who is being seen today for the evaluation of chest pain at the request of Dr. Smith/chest pain unit consult.  History of Present Illness:   He has no formal cardiac history but does have prior LV dysfunction on remote echo, done due to his identical twin having to have a valve repair. 2D Echo 09/2012 showed EF 50%,  difficult toassess whether inferior and apical walls are hypokinetic, limited echo recommended to confirm RWMA; mild LVH, grade 1 DD, mild AI/MR, mild LAE. F/u limited echo 10/2012 showed EF 45-50%, no segmental WMA. He reports a remote stress test that was OK, but no further details available.  He exercises 5 days a week at the advice of his neurologist and has been doing so without any functional limitation. He typically alternates cardio (working up to 3.41mh on the treadmill) and light free weight lifting. He has not had any recent angina or SOB. 2 nights ago while at rest he noticed a very dull, light chest discomfort in his upper left chest to left shoulder. He did not think much of it due to mild nature so went to bed. When he woke up and began his day yesterday he noticed it was there in the background sometimes for hours. It was not worse with exertion, palpation, movement, inspiration, eating, nor was it better with any of these things. He saw his PCP who drew OP CKMB which was elevated at 7.8 so he was advised to come to the ER. Last LDL 76.  Labs notable for troponin neg x 3, Na 130 c/w prior, Hgb 12-13. Has continued to have intermittent CP but describes it as an "irritation" because of its low level. No other recent sx including SOB, n/v, diaphoresis, palpitations or syncope. VS show BP 1211-941systolic (not on any meds at home), telemetry also shows low resting HR in the 40s-50s with periodic drop to 40 during sleeping hours (sinus bradycardia without high grade block). Not on any AVN blocking agents.   Past Medical History:  Diagnosis Date  . Allergic rhinitis   . Anemia, iron deficiency   . Arthritis   . Blood transfusion without reported diagnosis   . [place] stricture [place]   . ED (erectile dysfunction)   . Esophageal stricture    Status post upper endoscopy with esophageal dilatation  . Gastric polyps   . GERD (gastroesophageal reflux disease)   . Hiatal hernia   . Hypertension   . Hyponatremia   . Inguinal hernia   . Kidney stone    x 2  . Parkinson disease ([place]   . Ulcerative colitis    status post total abdominal colectomy with ileoanal anastomosis    Past Surgical History:  Procedure Laterality Date  . COLECTOMY  1992   with ilial anal pull through (for ulcerative colitis)  . ETHMOIDECTOMY     and maxillary enterostomies '04, redo in 12/04  . INGUINAL HERNIA REPAIR    . KIDNEY STONE SURGERY     removed 2013  .  KNEE ARTHROSCOPY Right 2004  . LASIK       Inpatient Medications: Scheduled Meds: . aspirin  81 mg Oral Daily  . enoxaparin (LOVENOX) injection  40 mg Subcutaneous Daily  . ferrous sulfate  325 mg Oral Q breakfast  . pantoprazole  40 mg Oral Daily  . psyllium  1 packet Oral BID  . [START ON 05/29/2017] rotigotine  1 patch Transdermal Daily   Continuous Infusions: . sodium chloride     PRN Meds: acetaminophen, gi cocktail, hydrALAZINE, morphine injection, nitroGLYCERIN, ondansetron (ZOFRAN) IV  Home Meds: Prior to Admission medications   Medication Sig Start Date End Date Taking?  Authorizing Provider  aspirin (ASPIRIN CHILDRENS) 81 MG chewable tablet Chew 1 tablet (81 mg total) by mouth daily. 10/23/14  Yes Plotnikov, Evie Lacks, MD  ferrous sulfate 325 (65 FE) MG tablet Take 325 mg by mouth daily with breakfast.     Yes [provider]  GuaiFENesin (MUCINEX PO) Take 1 tablet by mouth as needed (Cough).   Yes [provider]  ibuprofen (ADVIL,MOTRIN) 200 MG tablet Take 400 mg by mouth every 6 (six) hours as needed for fever, mild pain or moderate pain.   Yes [provider]  Multiple Vitamin (MULTIVITAMIN WITH MINERALS) TABS tablet Take 1 tablet by mouth daily.   Yes [provider]  NEUPRO 4 MG/24HR place 1 patch ONTO THE SKIN once daily 12/31/16  Yes Tat, Eustace Quail, DO  omeprazole (PRILOSEC) 40 MG capsule Take 1 capsule (40 mg total) by mouth 2 (two) times daily. 05/06/17  Yes Irene Shipper, MD  PSEUDOEPHEDRINE-DM PO Take 2 tablets by mouth as needed (congestion).   Yes [provider]  psyllium (METAMUCIL) 58.6 % powder Take 1 packet by mouth 2 (two) times daily. 1 TBS Twice Daily.   Yes [provider]  sildenafil (REVATIO) 20 MG tablet Take 20-100 mg by mouth as needed (Intercourse).   Yes [provider]  b complex vitamins tablet Take 1 tablet by mouth daily. Patient not taking: Reported on 05/28/2017 05/27/17   Plotnikov, Evie Lacks, MD  Cholecalciferol (VITAMIN D3) 2000 units capsule Take 1 capsule (2,000 Units total) by mouth daily. Patient not taking: Reported on 05/28/2017 05/27/17   Plotnikov, Evie Lacks, MD  Sildenafil Citrate (VIAGRA PO) Take by mouth. Reported on 11/12/2015    [provider]    Allergies:   No Known Allergies  Social History:   Social History   Social History  . Marital status: Married    Spouse name: N/A  . Number of children: N/A  . Years of education: N/A   Occupational History  . 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 Smoking status:  Former Smoker    Quit date: 09/14/1979  . Smokeless tobacco: Never Used     Comment: smoked for 7 years  . Alcohol use 0.0 oz/week     Comment: 2 glasses of wine per night  . Drug use: No  . Sexual activity: Yes   Other Topics Concern  . Not on file   Social History Narrative   [employer] grad   Married 13.5 years- divorced; married '05   No children   Work: Insurance underwriter   Patient is a former smoker. Quit in 1981   Alcohol use- yes social   Daily Caffeine use 2 cups coffee per day   Illicit drug use- no    Family History:   The patient's family history includes  Cancer in his father; Dementia in his mother; Heart disease in his brother; Hypertension in his mother; Rectal cancer in his father. There is no history of Colon cancer, Esophageal cancer, or Stomach cancer.  ROS:  Please see the history of present illness.  All other ROS reviewed and negative.     Physical Exam/Data:   Vitals:   05/27/17 2300 05/27/17 2330 05/28/17 0159 05/28/17 0500  BP: (!) 157/83 (!) 159/75 (!) 173/53 (!) 167/70  Pulse: (!) 54 (!) 49 (!) 48 (!) 49  Resp: 16 11 16 16   Temp:   98.8 F (37.1 C) 98 F (36.7 C)  TempSrc:   Oral Oral  SpO2: 100% 98% 100% 100%  Weight:   165 lb 6.4 oz (75 kg)   Height:   5' 7"  (1.702 m)    No intake or output data in the 24 hours ending 05/28/17 1148 Filed Weights   05/28/17 0159  Weight: 165 lb 6.4 oz (75 kg)   Body mass index is 25.91 kg/m.  General: Well developed, well nourished WM, in no acute distress. Head: Normocephalic, atraumatic, sclera non-icteric, no xanthomas, nares are without discharge.  Neck: Negative for carotid bruits. JVD not elevated. Lungs: Clear bilaterally to auscultation without wheezes, rales, or rhonchi. Breathing is unlabored. Heart: RRR with S1 S2. No murmurs, rubs, or gallops appreciated. Abdomen: Soft, non-tender, non-distended with normoactive bowel sounds. No hepatomegaly. No rebound/guarding. No obvious abdominal  masses. Msk:  Strength and tone appear normal for age. Extremities: No clubbing or cyanosis. No edema.  Distal pedal pulses are 2+ and equal bilaterally. Neuro: Alert and oriented X 3. No facial asymmetry. No focal deficit. Moves all extremities spontaneously. Psych:  Responds to questions appropriately with a normal affect.  EKG:  The EKG was personally reviewed and demonstrates SB 48bpm, 1st degree AVB, nonspecific ST-T Changes, incomplete RBBB  Relevant CV Studies: See above  Laboratory Data:  Chemistry  Recent Labs Lab 05/27/17 1138 05/27/17 1816 05/28/17 0231  NA 130* 130* 131*  K 4.2 4.0 3.4*  CL 98 100* 101  CO2 25 21* 24  GLUCOSE 104* 99 114*  BUN 22 19 15   CREATININE 0.96 0.96 1.05  CALCIUM 9.5 9.1 8.6*  GFRNONAA  --  >60 >60  GFRAA  --  >60 >60  ANIONGAP  --  9 6     Recent Labs Lab 05/27/17 1138  PROT 7.0  ALBUMIN 4.4  AST 16  ALT 19  ALKPHOS 37*  BILITOT 0.7   Hematology  Recent Labs Lab 05/27/17 1138 05/27/17 1816 05/28/17 0231  WBC 4.2 4.8 3.8*  RBC 4.09* 4.16* 3.82*  HGB 13.6 13.5 12.3*  HCT 40.0 38.6* 35.7*  MCV 97.8 92.8 93.5  MCH  --  32.5 32.2  MCHC 33.9 35.0 34.5  RDW 12.6 11.9 12.0  PLT 190.0 187 158   Cardiac Enzymes  Recent Labs Lab 05/28/17 0231 05/28/17 0540  TROPONINI <0.03 <0.03     Recent Labs Lab 05/27/17 1825  TROPIPOC 0.00    BNPNo results for input(s): BNP, PROBNP in the last 168 hours.  DDimer No results for input(s): DDIMER in the last 168 hours.  Radiology/Studies:  Dg Chest 2 View  Result Date: 05/27/2017 CLINICAL DATA:  Chest pressure radiating into right shoulder. EXAM: CHEST  2 VIEW COMPARISON:  05/27/2017 FINDINGS: The heart size and mediastinal contours are within normal limits. Both lungs are clear. The visualized skeletal structures are unremarkable. IMPRESSION: No active cardiopulmonary disease. Electronically Signed  By: Kerby Moors M.D.   On: 05/27/2017 18:47   Dg Chest 2 View  Result  Date: 05/27/2017 CLINICAL DATA:  Left chest pain EXAM: CHEST  2 VIEW COMPARISON:  10/23/2009 FINDINGS: The heart size and mediastinal contours are within normal limits. Both lungs are clear. The visualized skeletal structures are unremarkable. IMPRESSION: No active cardiopulmonary disease. Electronically Signed   By: Jerilynn Mages.  Shick M.D.   On: 05/27/2017 11:53    Assessment and Plan:   1. Chest pain - mostly atypical, EKG nonspecific. Remote echo with depressed EF but no segmental wall motion abnormalities. Will review w/u with MD.  2. Sinus bradycardia - no specific symptoms related to this, would avoid AVN blocking agents at this time. Almost all lower 40's HR were during nocturnal hours; upper 40s-low 50s during waking hours. Denies symptoms of chronotropic incompetence. Check TSH.   3. Elevated blood pressure without prior diagnosis of HTN - further management per primary team.  4. Hyponatremia/hypokalemia - I am unclear if he has had any prior workup for his hyponatremia. Would consider CT chest as outpatient. Will defer further management of lytes to IM.   Signed, Charlie Pitter, PA-C  05/28/2017 11:48 AM   The patient was seen and examined, and I agree with the history, physical exam, assessment and plan as documented above, with modifications as noted below. I have also personally reviewed all relevant documentation, old records, labs, and both radiographic and cardiovascular studies. I have also independently interpreted old and new ECG's.  66 yr old male with aforementioned history (Parkinson's disease, LV dysfunction) admitted with upper left sided chest pain at the direction of his PCP after a CK-MB was found to be elevated.  He also has hyponatremia which appears to be chronic and of uncertain etiology (I do not know what his PCP's workup has been thus far regarding this). Has a remote h/o smoking.  Symptoms began 2 days ago with a waxing and waning character. ECG with nonspecific  abnormalities and troponins have been normal.  An echocardiogram has been ordered.  Recommendations: The patient was discharged by the hospitalist service. We will arrange for outpatient follow up including a stress test and echocardiogram. Given his hyponatremia and chest pain, would consider CT of the chest to evaluate for a pulmonary lesion.  Kate Sable, MD, [facility]  05/28/2017 12:31 PM

## 2017-05-30 ENCOUNTER — Telehealth: Payer: Self-pay | Admitting: *Deleted

## 2017-05-30 NOTE — Telephone Encounter (Signed)
Transition Care Management Follow-up Telephone Call   Date discharged? 05/28/17   How have you been since you were released from the hospital? Pt states he is doing ok   Do you understand why you were in the hospital? YES   Do you understand the discharge instructions? YES   Where were you discharged to? Home   Items Reviewed:  Medications reviewed: YES  Allergies reviewed: YES  Dietary changes reviewed: NO  Referrals reviewed: YES, Pt states he is seeing his cardiologist 1st of Oct   Functional Questionnaire:   Activities of Daily Living (ADLs):   He states he are independent in the following: bathing and hygiene, feeding, continence, grooming, toileting and dressing States he require assistance with the following: ambulation   Any transportation issues/concerns?: NO   Any patient concerns? NO   Confirmed importance and date/time of follow-up visits scheduled YES, appt 06/03/17  Provider Appointment booked with Dr. Alain Marion  Confirmed with patient if condition begins to worsen call PCP or go to the ER.  Patient was given the office number and encouraged to call back with question or concerns.  : YES

## 2017-06-03 ENCOUNTER — Ambulatory Visit (INDEPENDENT_AMBULATORY_CARE_PROVIDER_SITE_OTHER): Payer: Medicare HMO | Admitting: Internal Medicine

## 2017-06-03 ENCOUNTER — Encounter: Payer: Self-pay | Admitting: Internal Medicine

## 2017-06-03 DIAGNOSIS — G20 Parkinson's disease: Secondary | ICD-10-CM | POA: Diagnosis not present

## 2017-06-03 DIAGNOSIS — R0789 Other chest pain: Secondary | ICD-10-CM

## 2017-06-03 DIAGNOSIS — I10 Essential (primary) hypertension: Secondary | ICD-10-CM

## 2017-06-03 MED ORDER — LOSARTAN POTASSIUM 50 MG PO TABS
50.0000 mg | ORAL_TABLET | Freq: Every day | ORAL | 11 refills | Status: DC
Start: 2017-06-03 — End: 2018-06-29

## 2017-06-03 NOTE — Patient Instructions (Addendum)
Start Losartan 1/2 tab a day if BP>150/95

## 2017-06-03 NOTE — Assessment & Plan Note (Signed)
MI r/o ECHO ok

## 2017-06-03 NOTE — Progress Notes (Signed)
Subjective:  Patient ID: William [name], male    DOB: Jun 05, 1951  Age: 66 y.o. MRN: [id number]  CC: No chief complaint on file.   HPI William [name] presents for post-hosp f/u. He was hospitalized for CP - r/o MI, ECHO was ok F/u HTN  Hx: "Atypical chest pain/pressure - Pt says his symptoms are resolved now.  I had consulted cardiology to see him but patient says that he doesn't want to wait in hospital any longer.  He says that he will follow up outpatient.  His troponins have been negative x 3 and his telemetry has been stable and no acute findings.  I will have patient follow up outpatient with [facility].  He did not want to wait for his echocardiogram so will recommend he have it done outpatient.  Risks discussed with patient and he verbalized understanding.    Parkinson disease  - Continue rotigotine  Essential hypertension:Patient currently not on any antihypertensive medications. Initially blood pressure seen as high as 171/61, but could be do to acute stress.  - BPs have been elevated in hospital. I will prescribe amlodipine 5 mg daily for blood pressure and have him follow up with his PCP.   Hyperlipidemia:Last lipid panel obtained on 04/06/2017 showed total cholesterol 157, HDL 69, LDL 76, and triglycerides 55.  Bradycardia - Pt did not want to wait to be seen by cardiology in hospital.  He will follow up with them outpatient.     Hyponatremia: Chronic. Patient's initial sodium level 130 on admission. Patient has had chronically low sodium levels seen as far back as 2013."    Outpatient Medications Prior to Visit  Medication Sig Dispense Refill  . amLODipine (NORVASC) 5 MG tablet Take 1 tablet (5 mg total) by mouth daily. 30 tablet 0  . aspirin (ASPIRIN CHILDRENS) 81 MG chewable tablet Chew 1 tablet (81 mg total) by mouth daily. 36 tablet 11  . ferrous sulfate 325 (65 FE) MG tablet Take 325 mg by mouth daily with breakfast.      . GuaiFENesin (MUCINEX PO) Take  1 tablet by mouth as needed (Cough).    [name] ibuprofen (ADVIL,MOTRIN) 200 MG tablet Take 400 mg by mouth every 6 (six) hours as needed for fever, mild pain or moderate pain.    . Multiple Vitamin (MULTIVITAMIN WITH MINERALS) TABS tablet Take 1 tablet by mouth daily.    [name] NEUPRO 4 MG/24HR place 1 patch ONTO THE SKIN once daily 30 patch 5  . omeprazole (PRILOSEC) 40 MG capsule Take 1 capsule (40 mg total) by mouth 2 (two) times daily. 60 capsule 3  . PSEUDOEPHEDRINE-DM PO Take 2 tablets by mouth as needed (congestion).    . psyllium (METAMUCIL) 58.6 % powder Take 1 packet by mouth 2 (two) times daily. 1 TBS Twice Daily.    . sildenafil (REVATIO) 20 MG tablet Take 20-100 mg by mouth as needed (Intercourse).    . Sildenafil Citrate (VIAGRA PO) Take by mouth. Reported on 11/12/2015     No facility-administered medications prior to visit.     ROS Review of Systems  Constitutional: Positive for fatigue. Negative for appetite change and unexpected weight change.  HENT: Negative for congestion, nosebleeds, sneezing, sore throat and trouble swallowing.   Eyes: Negative for itching and visual disturbance.  Respiratory: Negative for cough.   Cardiovascular: Negative for chest pain, palpitations and leg swelling.  Gastrointestinal: Negative for abdominal distention, blood in stool, diarrhea and nausea.  Genitourinary: Negative for frequency and  hematuria.  Musculoskeletal: Negative for back pain, gait problem, joint swelling and neck pain.  Skin: Negative for rash.  Neurological: Positive for tremors. Negative for dizziness, speech difficulty and weakness.  Psychiatric/Behavioral: Negative for agitation, dysphoric mood and sleep disturbance. The patient is not nervous/anxious.     Objective:  BP (!) 162/84 (BP Location: Left Arm, Patient Position: Sitting, Cuff Size: Normal)   Pulse (!) 51   Temp 98 F (36.7 C) (Oral)   Ht 5' 7"  (1.702 m)   Wt 167 lb (75.8 kg)   SpO2 99%   BMI 26.16 kg/m   BP  Readings from Last 3 Encounters:  06/03/17 (!) 162/84  05/28/17 (!) 167/70  05/27/17 130/80    Wt Readings from Last 3 Encounters:  06/03/17 167 lb (75.8 kg)  05/28/17 165 lb 6.4 oz (75 kg)  05/27/17 168 lb (76.2 kg)    Physical Exam  Constitutional: He is oriented to person, place, and time. He appears well-developed. No distress.  NAD  HENT:  Mouth/Throat: Oropharynx is clear and moist.  Eyes: Pupils are equal, round, and reactive to light. Conjunctivae are normal.  Neck: Normal range of motion. No JVD present. No thyromegaly present.  Cardiovascular: Normal rate, regular rhythm, normal heart sounds and intact distal pulses.  Exam reveals no gallop and no friction rub.   No murmur heard. Pulmonary/Chest: Effort normal and breath sounds normal. No respiratory distress. He has no wheezes. He has no rales. He exhibits no tenderness.  Abdominal: Soft. Bowel sounds are normal. He exhibits no distension and no mass. There is no tenderness. There is no rebound and no guarding.  Musculoskeletal: Normal range of motion. He exhibits no edema or tenderness.  Lymphadenopathy:    He has no cervical adenopathy.  Neurological: He is alert and oriented to person, place, and time. He has normal reflexes. No cranial nerve deficit. He exhibits normal muscle tone. He displays a negative Romberg sign. Coordination and gait normal.  Skin: Skin is warm and dry. No rash noted.  Psychiatric: He has a normal mood and affect. His behavior is normal. Judgment and thought content normal.  somewhat flat affect  Lab Results  Component Value Date   WBC 3.8 (L) 05/28/2017   HGB 12.3 (L) 05/28/2017   HCT 35.7 (L) 05/28/2017   PLT 158 05/28/2017   GLUCOSE 114 (H) 05/28/2017   CHOL 157 04/06/2017   TRIG 55.0 04/06/2017   HDL 69.70 04/06/2017   LDLCALC 76 04/06/2017   ALT 19 05/27/2017   AST 16 05/27/2017   NA 131 (L) 05/28/2017   K 3.4 (L) 05/28/2017   CL 101 05/28/2017   CREATININE 1.05 05/28/2017    BUN 15 05/28/2017   CO2 24 05/28/2017   TSH 2.20 04/06/2017   PSA 0.98 04/06/2017    Dg Chest 2 View  Result Date: 05/27/2017 CLINICAL DATA:  Chest pressure radiating into right shoulder. EXAM: CHEST  2 VIEW COMPARISON:  05/27/2017 FINDINGS: The heart size and mediastinal contours are within normal limits. Both lungs are clear. The visualized skeletal structures are unremarkable. IMPRESSION: No active cardiopulmonary disease. Electronically Signed   By: Kerby Moors M.D.   On: 05/27/2017 18:47   Dg Chest 2 View  Result Date: 05/27/2017 CLINICAL DATA:  Left chest pain EXAM: CHEST  2 VIEW COMPARISON:  10/23/2009 FINDINGS: The heart size and mediastinal contours are within normal limits. Both lungs are clear. The visualized skeletal structures are unremarkable. IMPRESSION: No active cardiopulmonary disease. Electronically Signed  By: Eugenie Filler M.D.   On: 05/27/2017 11:53    Assessment & Plan:   There are no diagnoses linked to this encounter. I am having Mr. Vinzant maintain his ferrous sulfate, psyllium, Sildenafil Citrate (VIAGRA PO), aspirin, NEUPRO, omeprazole, sildenafil, PSEUDOEPHEDRINE-DM PO, GuaiFENesin (MUCINEX PO), multivitamin with minerals, ibuprofen, and amLODipine.  No orders of the defined types were placed in this encounter.    Follow-up: No Follow-up on file.  Walker Kehr, MD

## 2017-06-03 NOTE — Assessment & Plan Note (Signed)
Losartan, Amlodipine - not taking

## 2017-06-03 NOTE — Assessment & Plan Note (Signed)
On Rx 

## 2017-06-07 ENCOUNTER — Encounter: Payer: Self-pay | Admitting: Nurse Practitioner

## 2017-06-07 ENCOUNTER — Ambulatory Visit (INDEPENDENT_AMBULATORY_CARE_PROVIDER_SITE_OTHER): Payer: Medicare HMO | Admitting: Nurse Practitioner

## 2017-06-07 VITALS — BP 130/70 | HR 63 | Temp 98.2°F | Ht 67.0 in | Wt 170.0 lb

## 2017-06-07 DIAGNOSIS — J069 Acute upper respiratory infection, unspecified: Secondary | ICD-10-CM | POA: Diagnosis not present

## 2017-06-07 MED ORDER — FLUTICASONE PROPIONATE 50 MCG/ACT NA SUSP: 2.0000 | Freq: Every day | NASAL | 0 refills | Status: DC

## 2017-06-07 MED ORDER — METHYLPREDNISOLONE ACETATE 40 MG/ML IJ SUSP
40.0000 mg | Freq: Once | INTRAMUSCULAR | Status: AC
  Administered 2017-06-07: 40 mg via INTRAMUSCULAR

## 2017-06-07 MED ORDER — DM-GUAIFENESIN ER 30-600 MG PO TB12: 1.0000 | ORAL_TABLET | Freq: Two times a day (BID) | ORAL | 0 refills | Status: DC | PRN

## 2017-06-07 MED ORDER — SALINE SPRAY 0.65 % NA SOLN: 1.0000 | NASAL | 0 refills | Status: DC | PRN

## 2017-06-07 NOTE — Progress Notes (Signed)
Subjective:  Patient ID: William [name], male    DOB: February 17, 1951  Age: 66 y.o. MRN: [id number]  CC: Sinusitis (pressure in face,coughing up green mucus,nasal drips--2 days. )   Sinusitis  This is a new problem. The current episode started yesterday. The problem is unchanged. Associated symptoms include congestion, coughing, sinus pressure and sneezing. Pertinent negatives include no chills, diaphoresis, ear pain, headaches, hoarse voice, neck pain, shortness of breath, sore throat or swollen glands. Past treatments include oral decongestants and acetaminophen. The treatment provided mild relief.    Outpatient Medications Prior to Visit  Medication Sig Dispense Refill  . aspirin (ASPIRIN CHILDRENS) 81 MG chewable tablet Chew 1 tablet (81 mg total) by mouth daily. 36 tablet 11  . ferrous sulfate 325 (65 FE) MG tablet Take 325 mg by mouth daily with breakfast.      . ibuprofen (ADVIL,MOTRIN) 200 MG tablet Take 400 mg by mouth every 6 (six) hours as needed for fever, mild pain or moderate pain.    [name] losartan (COZAAR) 50 MG tablet Take 1 tablet (50 mg total) by mouth daily. 30 tablet 11  . Multiple Vitamin (MULTIVITAMIN WITH MINERALS) TABS tablet Take 1 tablet by mouth daily.    [name] NEUPRO 4 MG/24HR place 1 patch ONTO THE SKIN once daily 30 patch 5  . omeprazole (PRILOSEC) 40 MG capsule Take 1 capsule (40 mg total) by mouth 2 (two) times daily. 60 capsule 3  . psyllium (METAMUCIL) 58.6 % powder Take 1 packet by mouth 2 (two) times daily. 1 TBS Twice Daily.    . sildenafil (REVATIO) 20 MG tablet Take 20-100 mg by mouth as needed (Intercourse).    . Sildenafil Citrate (VIAGRA PO) Take by mouth. Reported on 11/12/2015    . GuaiFENesin (MUCINEX PO) Take 1 tablet by mouth as needed (Cough).    [name] PSEUDOEPHEDRINE-DM PO Take 2 tablets by mouth as needed (congestion).     No facility-administered medications prior to visit.     ROS See HPI  Objective:  BP 130/70   Pulse 63   Temp 98.2 F (36.8 C)    Ht 5' 7"  (1.702 m)   Wt 170 lb (77.1 kg)   SpO2 98%   BMI 26.63 kg/m   BP Readings from Last 3 Encounters:  06/07/17 130/70  06/03/17 (!) 162/84  05/28/17 (!) 167/70    Wt Readings from Last 3 Encounters:  06/07/17 170 lb (77.1 kg)  06/03/17 167 lb (75.8 kg)  05/28/17 165 lb 6.4 oz (75 kg)    Physical Exam  Constitutional: He is oriented to person, place, and time. No distress.  HENT:  Right Ear: Tympanic membrane, external ear and ear canal normal.  Left Ear: Tympanic membrane and ear canal normal.  Nose: Mucosal edema and rhinorrhea present. Right sinus exhibits maxillary sinus tenderness and frontal sinus tenderness. Left sinus exhibits maxillary sinus tenderness and frontal sinus tenderness.  Mouth/Throat: Uvula is midline. Posterior oropharyngeal erythema present. No oropharyngeal exudate.  Eyes: No scleral icterus.  Neck: Normal range of motion. Neck supple.  Cardiovascular: Normal rate and regular rhythm.   Pulmonary/Chest: Effort normal and breath sounds normal.  Musculoskeletal: He exhibits no edema.  Lymphadenopathy:    He has no cervical adenopathy.  Neurological: He is alert and oriented to person, place, and time.  Vitals reviewed.   Lab Results  Component Value Date   WBC 3.8 (L) 05/28/2017   HGB 12.3 (L) 05/28/2017   HCT 35.7 (L) 05/28/2017   PLT  158 05/28/2017   GLUCOSE 114 (H) 05/28/2017   CHOL 157 04/06/2017   TRIG 55.0 04/06/2017   HDL 69.70 04/06/2017   LDLCALC 76 04/06/2017   ALT 19 05/27/2017   AST 16 05/27/2017   NA 131 (L) 05/28/2017   K 3.4 (L) 05/28/2017   CL 101 05/28/2017   CREATININE 1.05 05/28/2017   BUN 15 05/28/2017   CO2 24 05/28/2017   TSH 2.20 04/06/2017   PSA 0.98 04/06/2017    Dg Chest 2 View  Result Date: 05/27/2017 CLINICAL DATA:  Chest pressure radiating into right shoulder. EXAM: CHEST  2 VIEW COMPARISON:  05/27/2017 FINDINGS: The heart size and mediastinal contours are within normal limits. Both lungs are clear.  The visualized skeletal structures are unremarkable. IMPRESSION: No active cardiopulmonary disease. Electronically Signed   By: Kerby Moors M.D.   On: 05/27/2017 18:47   Dg Chest 2 View  Result Date: 05/27/2017 CLINICAL DATA:  Left chest pain EXAM: CHEST  2 VIEW COMPARISON:  10/23/2009 FINDINGS: The heart size and mediastinal contours are within normal limits. Both lungs are clear. The visualized skeletal structures are unremarkable. IMPRESSION: No active cardiopulmonary disease. Electronically Signed   By: Jerilynn Mages.  Shick M.D.   On: 05/27/2017 11:53    Assessment & Plan:   Boomer was seen today for sinusitis.  Diagnoses and all orders for this visit:  Acute URI -     fluticasone (FLONASE) 50 MCG/ACT nasal spray; Place 2 sprays into both nostrils daily. -     sodium chloride (OCEAN) 0.65 % SOLN nasal spray; Place 1 spray into both nostrils as needed for congestion. -     dextromethorphan-guaiFENesin (MUCINEX DM) 30-600 MG 12hr tablet; Take 1 tablet by mouth 2 (two) times daily as needed for cough. -     methylPREDNISolone acetate (DEPO-MEDROL) injection 40 mg; Inject 1 mL (40 mg total) into the muscle once.   I have discontinued Mr. Lao's PSEUDOEPHEDRINE-DM PO and GuaiFENesin (MUCINEX PO). I am also having him start on fluticasone, sodium chloride, and dextromethorphan-guaiFENesin. Additionally, I am having him maintain his ferrous sulfate, psyllium, Sildenafil Citrate (VIAGRA PO), aspirin, NEUPRO, omeprazole, sildenafil, multivitamin with minerals, ibuprofen, and losartan. We will continue to administer methylPREDNISolone acetate.  Meds ordered this encounter  Medications  . fluticasone (FLONASE) 50 MCG/ACT nasal spray    Sig: Place 2 sprays into both nostrils daily.    Dispense:  16 g    Refill:  0    Order Specific Question:   Supervising Provider    Answer:   Cassandria Anger [[id number]]  . sodium chloride (OCEAN) 0.65 % SOLN nasal spray    Sig: Place 1 spray into both nostrils as  needed for congestion.    Dispense:  15 mL    Refill:  0    Order Specific Question:   Supervising Provider    Answer:   Cassandria Anger [[id number]]  . dextromethorphan-guaiFENesin (MUCINEX DM) 30-600 MG 12hr tablet    Sig: Take 1 tablet by mouth 2 (two) times daily as needed for cough.    Dispense:  14 tablet    Refill:  0    Order Specific Question:   Supervising Provider    Answer:   Cassandria Anger [[id number]]  . methylPREDNISolone acetate (DEPO-MEDROL) injection 40 mg    Follow-up: Return if symptoms worsen or fail to improve.  Wilfred Lacy, NP

## 2017-06-07 NOTE — Patient Instructions (Signed)
URI Instructions: Encourage adequate oral hydration.  Avoid decongestants if you have high blood pressure. Use" Delsym" or" Robitussin" cough syrup varietis for cough.  You can use plain "Tylenol" or "Advi"l for fever, chills and achyness.   "Common cold" symptoms are usually triggered by a virus.  The antibiotics are usually not necessary. On average, a" viral cold" illness would take 4-7 days to resolve. Please, make an appointment if you are not better or if you're worse.  Call office for antibiotic prescription if no improvement in 3days.

## 2017-06-16 ENCOUNTER — Ambulatory Visit (INDEPENDENT_AMBULATORY_CARE_PROVIDER_SITE_OTHER): Payer: Medicare HMO | Admitting: Cardiovascular Disease

## 2017-06-16 ENCOUNTER — Encounter: Payer: Self-pay | Admitting: Cardiovascular Disease

## 2017-06-16 VITALS — BP 158/88 | HR 55 | Ht 67.0 in | Wt 168.2 lb

## 2017-06-16 DIAGNOSIS — K219 Gastro-esophageal reflux disease without esophagitis: Secondary | ICD-10-CM | POA: Diagnosis not present

## 2017-06-16 DIAGNOSIS — I517 Cardiomegaly: Secondary | ICD-10-CM | POA: Diagnosis not present

## 2017-06-16 DIAGNOSIS — I10 Essential (primary) hypertension: Secondary | ICD-10-CM | POA: Diagnosis not present

## 2017-06-16 DIAGNOSIS — R0789 Other chest pain: Secondary | ICD-10-CM

## 2017-06-16 NOTE — Patient Instructions (Signed)
Medication Instructions:  BEGIN the losartan 50 mg daily --take 25 mg (1/2 tablet) for 1 week, then increase to 50 mg (1 tablet) daily.  Follow-Up: Your physician recommends that you schedule a follow-up appointment in: 3 MONTHS with Dr. Claiborne Billings.   Any Other Special Instructions Will Be Listed Below (If Applicable).     If you need a refill on your cardiac medications before your next appointment, please call your pharmacy.

## 2017-06-16 NOTE — Progress Notes (Signed)
Cardiology Office Note    Date:  06/18/2017   ID:  William [name], DOB 1951-05-11, MRN [id number]  PCP:  Cassandria Anger, MD  Cardiologist:  Shelva Majestic, MD   Evaluation for chest pain.  History of Present Illness:  William [name] is a 66 y.o. male who is referred for cardiology evaluation.  Further evaluation of left-sided dull nonexertional chest pain.  William [name] has a history of hypertension, Parkinson's disease, ulcerative colitis, stab status post total abdominal colectomy, GERD, hiatal hernia, arthritis, and iron deficiency anemia.  The patient had recently seen his primary physician who drew a CPK which was elevated at 7.8 an ER evaluation was advised.  The patient was seen in cardiology consultation by Dr. Aviva Signs on 05/28/2017.  Remotely, the patient had previously undergone an echo Doppler study in 2014 which showed an EF of 50% with mild LVH, grade 1 diastolic dysfunction, mild AR and MR and mild left atrial enlargement.  Recently, he had experienced a dull light chest discomfort in the upper left chest radiating to his left sternal.  The pain recurred the following day and was specifically not worse with exertion, or inspiration.  When he was evaluated.  He was bradycardic in the 40s to 50s.  His ECG was nonspecific and was felt that his chest pain was mostly atypical.  Commended that he have an echo Doppler study.  With his history of mildly depressed EF.  Remotely, and also there was consideration for a nuclear stress test.  He underwent an echo Doppler study in 05/28/2017 which showed an EF now normal at 60-65%.  There was mild concentric and moderate focal basal septal hypertrophy.  There was mild AR and mild MR.  He had indeterminant filling pressures.  He never underwent a stress test.  He denies any recurrent symptomatology.  He admits to having some anxiety with the death of family members and starting a new job.  He has a hiatal hernia with GERD.  He has been able to  walk on a treadmill at home without chest pain or shortness of breath.  He presents for evaluation    Past Medical History:  Diagnosis Date  . Allergic rhinitis   . Anemia, iron deficiency   . Arthritis   . Blood transfusion without reported diagnosis   . Colonic stricture ([place])   . ED (erectile dysfunction)   . Esophageal stricture    Status post upper endoscopy with esophageal dilatation  . Gastric polyps   . GERD (gastroesophageal reflux disease)   . Hiatal hernia   . Hypertension   . Hyponatremia   . Inguinal hernia   . Kidney stone    x 2  . Parkinson disease ([employer])   . Ulcerative colitis    status post total abdominal colectomy with ileoanal anastomosis    Past Surgical History:  Procedure Laterality Date  . COLECTOMY  1992   with ilial anal pull through (for ulcerative colitis)  . ETHMOIDECTOMY     and maxillary enterostomies '04, redo in 12/04  . INGUINAL HERNIA REPAIR    . KIDNEY STONE SURGERY     removed 2013  . KNEE ARTHROSCOPY Right 2004  . LASIK      Current Medications: Outpatient Medications Prior to Visit  Medication Sig Dispense Refill  . aspirin (ASPIRIN CHILDRENS) 81 MG chewable tablet Chew 1 tablet (81 mg total) by mouth daily. 36 tablet 11  . dextromethorphan-guaiFENesin (MUCINEX DM) 30-600 MG 12hr tablet Take 1 tablet  by mouth 2 (two) times daily as needed for cough. 14 tablet 0  . ferrous sulfate 325 (65 FE) MG tablet Take 325 mg by mouth daily with breakfast.      . fluticasone (FLONASE) 50 MCG/ACT nasal spray Place 2 sprays into both nostrils daily. 16 g 0  . ibuprofen (ADVIL,MOTRIN) 200 MG tablet Take 400 mg by mouth every 6 (six) hours as needed for fever, mild pain or moderate pain.    . losartan (COZAAR) 50 MG tablet Take 1 tablet (50 mg total) by mouth daily. 30 tablet 11  . Multiple Vitamin (MULTIVITAMIN WITH MINERALS) TABS tablet Take 1 tablet by mouth daily.    . NEUPRO 4 MG/24HR place 1 patch ONTO THE SKIN once daily 30 patch 5  .  omeprazole (PRILOSEC) 40 MG capsule Take 1 capsule (40 mg total) by mouth 2 (two) times daily. 60 capsule 3  . psyllium (METAMUCIL) 58.6 % powder Take 1 packet by mouth 2 (two) times daily. 1 TBS Twice Daily.    . sildenafil (REVATIO) 20 MG tablet Take 20-100 mg by mouth as needed (Intercourse).    . Sildenafil Citrate (VIAGRA PO) Take by mouth. Reported on 11/12/2015    . sodium chloride (OCEAN) 0.65 % SOLN nasal spray Place 1 spray into both nostrils as needed for congestion. 15 mL 0   No facility-administered medications prior to visit.      Allergies:   Patient has no known allergies.   Social History   Social History  . Marital status: Married    Spouse name: N/A  . Number of children: N/A  . Years of education: N/A   Occupational History  . Mortgage Banker [employer]   Social History Main Topics  . Smoking status: Former Smoker    Quit date: 09/14/1979  . Smokeless tobacco: Never Used     Comment: smoked for 7 years  . Alcohol use 0.0 oz/week     Comment: 2 glasses of wine per night  . Drug use: No  . Sexual activity: Yes   Other Topics Concern  . None   Social History Narrative   [employer] grad   Married 13.5 years- divorced; married '05   No children   Work: mortgage banker   Patient is a former smoker. 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  Family History:  The patient's family history includes Cancer in his father; Dementia in his mother; Heart disease in his brother; Hypertension in his mother; Rectal cancer in his father.   ROS General: Negative; No fevers, chills, or night sweats;  HEENT: Negative; No changes in vision or hearing, sinus congestion, difficulty swallowing Pulmonary: Negative; No cough, wheezing, shortness of breath, hemoptysis Cardiovascular: Negative; No chest pain, presyncope, syncope,  palpitations GI: Hiatal hernia/GERD; history of ulcerative colitis status post abdominal colectomy GU: Negative; No dysuria, hematuria, or difficulty voiding Musculoskeletal: Negative; no myalgias, joint pain, or weakness Hematologic/Oncology: Negative; no easy bruising, bleeding Endocrine: Negative; no heat/cold intolerance; no diabetes Neuro: History of Parkinson's disease Skin: Negative; No rashes or skin lesions Psychiatric: Anxiety Sleep: Negative; No snoring, daytime sleepiness, hypersomnolence, bruxism, restless legs, hypnogognic hallucinations, no cataplexy Other comprehensive 14 point system review is negative.   PHYSICAL EXAM:   VS:  BP (!) 158/88   Pulse (!) 55     Ht 5' 7" (1.702 m)   Wt 168 lb 3.2 oz (76.3 kg)   BMI 26.34 kg/m     Repeat blood pressure 150/86 Wt Readings from Last 3 Encounters:  06/16/17 168 lb 3.2 oz (76.3 kg)  06/07/17 170 lb (77.1 kg)  06/03/17 167 lb (75.8 kg)    General: Alert, oriented, no distress.  Skin: normal turgor, no rashes, warm and dry HEENT: Normocephalic, atraumatic. Pupils equal round and reactive to light; sclera anicteric; extraocular muscles intact; Fundi Mild arteriolar narrowing.  No hemorrhages or exudates. Nose without nasal septal hypertrophy Mouth/Parynx benign; Mallinpatti scale 2 Neck: No JVD, no carotid bruits; normal carotid upstroke Lungs: clear to ausculatation and percussion; no wheezing or rales Chest wall: without tenderness to palpitation Heart: PMI not displaced, RRR, s1 s2 normal, 1/6 systolic murmur, faint AI murmur no diastolic murmur, no rubs, gallops, thrills, or heaves Abdomen: soft, nontender; no hepatosplenomehaly, BS+; abdominal aorta nontender and not dilated by palpation. Back: no CVA tenderness Pulses 2+ Musculoskeletal: full range of motion, normal strength, no joint deformities Extremities: no clubbing cyanosis or edema, Homan's sign negative  Neurologic: grossly nonfocal; Cranial nerves grossly  wnl Psychologic: Normal mood and affect   Studies/Labs Reviewed:   EKG:  EKG is ordered today.  ECG (independently read by me):  Sinus bradycardia 55 beats per minute, first-degree AV block with a PR interval of 28 ms.  Incomplete right bundle branch block.  Recent Labs: BMP Latest Ref Rng & Units 05/28/2017 05/27/2017 05/27/2017  Glucose 65 - 99 mg/dL 114(H) 99 104(H)  BUN 6 - 20 mg/dL 15 19 22  Creatinine 0.61 - 1.24 mg/dL 1.05 0.96 0.96  Sodium 135 - 145 mmol/L 131(L) 130(L) 130(L)  Potassium 3.5 - 5.1 mmol/L 3.4(L) 4.0 4.2  Chloride 101 - 111 mmol/L 101 100(L) 98  CO2 22 - 32 mmol/L 24 21(L) 25  Calcium 8.9 - 10.3 mg/dL 8.6(L) 9.1 9.5     Hepatic Function Latest Ref Rng & Units 05/27/2017 04/06/2017 08/02/2016  Total Protein 6.0 - 8.3 g/dL 7.0 6.8 6.6  Albumin 3.5 - 5.2 g/dL 4.4 4.4 4.1  AST 0 - 37 U/L 16 15 18  ALT 0 - 53 U/L 19 18 19  Alk Phosphatase 39 - 117 U/L 37(L) 36(L) 48  Total Bilirubin 0.2 - 1.2 mg/dL 0.7 0.5 0.6  Bilirubin, Direct 0.0 - 0.3 mg/dL 0.1 0.1 -    CBC Latest Ref Rng & Units 05/28/2017 05/27/2017 05/27/2017  WBC 4.0 - 10.5 K/uL 3.8(L) 4.8 4.2  Hemoglobin 13.0 - 17.0 g/dL 12.3(L) 13.5 13.6  Hematocrit 39.0 - 52.0 % 35.7(L) 38.6(L) 40.0  Platelets 150 - 400 K/uL 158 187 190.0   Lab Results  Component Value Date   MCV 93.5 05/28/2017   MCV 92.8 05/27/2017   MCV 97.8 05/27/2017   Lab Results  Component Value Date   TSH 2.20 04/06/2017   No results found for: HGBA1C   BNP No results found for: BNP  ProBNP No results found for: PROBNP   Lipid Panel     Component Value Date/Time   CHOL 157 04/06/2017 0745   TRIG 55.0 04/06/2017 0745   TRIG 41 08/17/2006 0744   HDL 69.70 04/06/2017 0745   CHOLHDL 2 04/06/2017 0745   VLDL 11.0 04/06/2017 0745   LDLCALC 76 04/06/2017 0745     RADIOLOGY: Dg Chest 2 View  Result Date: 05/27/2017 CLINICAL DATA:  Chest pressure radiating into right shoulder. EXAM: CHEST  2 VIEW COMPARISON:  05/27/2017    FINDINGS: The heart size and mediastinal contours are within normal limits. Both lungs are clear. The visualized skeletal structures are unremarkable. IMPRESSION: No active cardiopulmonary disease. Electronically Signed   By: Taylor  Stroud M.D.   On: 05/27/2017 18:47   Dg Chest 2 View  Result Date: 05/27/2017 CLINICAL DATA:  Left chest pain EXAM: CHEST  2 VIEW COMPARISON:  10/23/2009 FINDINGS: The heart size and mediastinal contours are within normal limits. Both lungs are clear. The visualized skeletal structures are unremarkable. IMPRESSION: No active cardiopulmonary disease. Electronically Signed   By: M.  Shick M.D.   On: 05/27/2017 11:53     Additional studies/ records that were reviewed today include:  I reviewed the patient's recent ER evaluation and cardiology evaluation from 05/28/2017    ASSESSMENT:    1. Essential hypertension, benign   2. Gastroesophageal reflux disease without esophagitis   3. Atypical chest pain   4. Left ventricular hypertrophy      PLAN:  Mr Crilly is a 65-year-old male who was a history of hypertension, and remotely had been on valsartan, but he is not been taking this.  He recently developed chest pain with atypical features.  His pain is nonexertional, left-sided.  He has not experienced any discomfort with activity or walking on a treadmill.  His recent echo Doppler study was reviewed which showed normal systolic function with mild AR and MR and mild left ventricular hypertrophy.  His blood pressure today was elevated, but he admits to some anxiety and typically he believes his blood pressure at home is been in the 130s.  He states he has been under increased anxiety recently with a start of a new job as well as death in the family.  With his blood pressure elevation.  I have recommended reinstitution of losartan and he will take 25 mg daily for the first week and then increase this to 50 mg as tolerated.  He continues to be on omeprazole for GERD.   Although it nuclear stress test was recommended.  When he had seen Dr. Koneswarem.  The patient denies any recurrent symptomatology.  At this point, with his atypical symptomatology, I do not feel it is essential that this be done presently, but if he were to develop any recurrent episodes.  I strongly recommended that we pursue this further evaluation.  I will see him in 3 months for reevaluation.   Medication Adjustments/Labs and Tests Ordered: Current medicines are reviewed at length with the patient today.  Concerns regarding medicines are outlined above.  Medication changes, Labs and Tests ordered today are listed in the Patient Instructions below. Patient Instructions  Medication Instructions:  BEGIN the losartan 50 mg daily --take 25 mg (1/2 tablet) for 1 week, then increase to 50 mg (1 tablet) daily.  Follow-Up: Your physician recommends that you schedule a follow-up appointment in: 3 MONTHS with Dr. .   Any Other Special Instructions Will Be Listed Below (If Applicable).     If you need a refill on your cardiac medications before your next appointment, please call your pharmacy.      Signed,  , MD  06/18/2017 10:10 PM    [facility] [address], [address], [place], [place]  [address] Phone: [phone]    

## 2017-06-29 ENCOUNTER — Other Ambulatory Visit: Payer: Self-pay | Admitting: Neurology

## 2017-06-30 ENCOUNTER — Ambulatory Visit: Payer: Medicare HMO | Admitting: Internal Medicine

## 2017-07-05 ENCOUNTER — Ambulatory Visit (INDEPENDENT_AMBULATORY_CARE_PROVIDER_SITE_OTHER): Payer: Medicare HMO | Admitting: Internal Medicine

## 2017-07-05 ENCOUNTER — Encounter: Payer: Self-pay | Admitting: Internal Medicine

## 2017-07-05 ENCOUNTER — Telehealth: Payer: Self-pay | Admitting: Neurology

## 2017-07-05 VITALS — BP 128/72 | HR 58 | Ht 67.0 in | Wt 171.6 lb

## 2017-07-05 DIAGNOSIS — D509 Iron deficiency anemia, unspecified: Secondary | ICD-10-CM | POA: Diagnosis not present

## 2017-07-05 DIAGNOSIS — K219 Gastro-esophageal reflux disease without esophagitis: Secondary | ICD-10-CM

## 2017-07-05 DIAGNOSIS — K624 Stenosis of anus and rectum: Secondary | ICD-10-CM | POA: Diagnosis not present

## 2017-07-05 MED ORDER — OMEPRAZOLE 40 MG PO CPDR: 40.0000 mg | DELAYED_RELEASE_CAPSULE | Freq: Two times a day (BID) | ORAL | 11 refills | Status: DC

## 2017-07-05 NOTE — Patient Instructions (Signed)
We have sent the following medications to your pharmacy for you to pick up at your convenience: omeprazole   Please follow through 2 years

## 2017-07-05 NOTE — Telephone Encounter (Signed)
Patient made aware and he agrees with this plan. He will keep follow up appt.

## 2017-07-05 NOTE — Telephone Encounter (Signed)
Let me address at visit since it has been so long.

## 2017-07-05 NOTE — Progress Notes (Signed)
HISTORY OF PRESENT ILLNESS:  William [name] is a 66 y.o. male with a history of erosive esophagitis, peptic stricture, iron deficiency anemia, remote ulcer colitis status post total abdominal colectomy with ileoanal anal anastomosis greater than 20 years ago, and ileoanal anastomotic stricturing with partial bowel obstruction requiring periodic endoscopic pneumatic dilation. Patient was last evaluated in this office March 2017. At that time he continued on MiraLAX for constipation, PPI for GERD, iron for history of iron deficiency anemia, and recommended follow-up in 1 year. Patient presents today for follow-up regarding his multiple GI diagnoses and their management. Reports recently being hospitalized with noncardiac chest pain (reviewed). Review of outside blood work from September 2015 shows CBC of 12.3 with MCV 93.5. Normal platelets. Last sigmoidoscopy with anastomotic balloon dilation performed March 2016 to a maximal diameter of 15 mm. Last upper endoscopy 2003. Patient reports that as long as he takes his PPI (omeprazole 40 mg twice daily) he has good control of reflux symptoms. He has had 6 or 7 pound weight gain over the past year. He does have Parkinson's disease. For his bowels he is currently taking Metamucil on a regular basis. Moves his bowels daily. Some vague lower abdominal discomfort at times though minor and nonprogressive. No bleeding. Recently moved jobs and is working with [employer] in [place].  REVIEW OF SYSTEMS:  All non-GI ROS negative entirely upon review  Past Medical History:  Diagnosis Date  . Allergic rhinitis   . Anemia, iron deficiency   . Arthritis   . Blood transfusion without reported diagnosis   . Colonic stricture ([place])   . ED (erectile dysfunction)   . Esophageal stricture    Status post upper endoscopy with esophageal dilatation  . Gastric polyps   . GERD (gastroesophageal reflux disease)   . Hiatal hernia   . Hypertension    . Hyponatremia   . Inguinal hernia   . Kidney stone    x 2  . Parkinson disease ([place])   . Ulcerative colitis    status post total abdominal colectomy with ileoanal anastomosis    Past Surgical History:  Procedure Laterality Date  . COLECTOMY  1992   with ilial anal pull through (for ulcerative colitis)  . ETHMOIDECTOMY     and maxillary enterostomies '04, redo in 12/04  . INGUINAL HERNIA REPAIR    . KIDNEY STONE SURGERY     removed 2013  . KNEE ARTHROSCOPY Right 2004  . LASIK      Social History William [name]  reports that he quit smoking about 37 years ago. He has never used smokeless tobacco. He reports that he drinks alcohol. He reports that he does not use drugs.  family history includes Cancer in his father; Dementia in his mother; Heart disease in his brother; Hypertension in his mother; Rectal cancer in his father.  No Known Allergies     PHYSICAL EXAMINATION: Vital signs: BP 128/72   Pulse (!) 58   Ht 5' 7"  (1.702 m)   Wt 171 lb 9.6 oz (77.8 kg)   SpO2 98%   BMI 26.88 kg/m   Constitutional: generally well-appearing, no acute distress Psychiatric: alert and oriented x3, cooperative Eyes: extraocular movements intact, anicteric, conjunctiva pink Mouth: oral pharynx moist, no lesions Neck: supple no lymphadenopathy Cardiovascular: heart regular rate and rhythm, no murmur Lungs: clear to auscultation bilaterally Abdomen: soft, nontender, nondistended, no obvious ascites, no peritoneal signs, normal bowel sounds, no organomegaly Rectal: Omitted Extremities: no clubbing, cyanosis, or  lower extremity edema bilaterally Skin: no lesions on visible extremities Neuro: No focal deficits. Cranial nerves intact. Tremor right upper extremity  ASSESSMENT:  #1. Remote history of ulcerative colitis status post total abdominal colectomy with ileoanal anastomosis #2. History of anastomotic stricture requiring periodic balloon dilation. Last dilation March 2016. He  remains asymptomatic at this time #3. Chronic constipation. Currently using fiber with good bowel movements #4. Chronic GERD. Requiring PPI for control of symptoms #5. History of iron deficiency anemia secondary to erosive esophagitis. Remains on chronic therapy. Good hemoglobins recently #6. History of Parkinson's disease   PLAN:  #1. Continue omeprazole 40 mg twice daily. Prescription refilled #2. Continue Metamucil for bowels #3. Continue iron #4. Routine follow-up 2 years. Sooner if needed for interval questions or problems as reviewed  15 minutes spent face-to-face with the patient. Greater than 50% a time use for counseling regarding his multiple GI diagnoses and their management

## 2017-07-05 NOTE — Telephone Encounter (Signed)
New Message  Pt voiced he has some concerns about memory issues and would like to speak to the nurse.  Please f/u

## 2017-07-05 NOTE — Telephone Encounter (Signed)
Spoke with patient. He is getting concerned about his memory and would like referral to have this tested by Dr. Si Raider.   Patient has not been seen since April in our office. Appt made for follow up on 07/14/17.   Do you want to wait to see him or can I put in referral for neuropsychological testing now?

## 2017-07-11 ENCOUNTER — Telehealth: Payer: Self-pay | Admitting: Neurology

## 2017-07-11 MED ORDER — ROTIGOTINE 4 MG/24HR TD PT24: MEDICATED_PATCH | TRANSDERMAL | 0 refills | Status: DC

## 2017-07-11 NOTE — Telephone Encounter (Signed)
Patient needs a refill on the patches he is out and uses the [facility] on [address]

## 2017-07-11 NOTE — Telephone Encounter (Signed)
RX sent

## 2017-07-13 NOTE — Progress Notes (Signed)
William [name] was seen today in the movement disorders clinic for neurologic consultation at the request of Plotnikov, Evie Lacks, MD.  The consultation is for the evaluation of tremor and gait instability.  Tremor is in both hands, but it is more in the R hand.  Pt states that he thought it was going on for 3 months but when he talked to his brother, he said he noted jaw tremor last summer.  He notes it at rest.  He also noted some shuffling.     02/26/16 update:  The patient follows up today regarding his new diagnosis of Parkinson's disease.  His wife accompanies him today and supplements the history.  He admits that there was an "emotional adjustment" to make over the diagnosis.  He states that the "jittery feeling" goes "up and down."  He is currently on no medications.  He has been exercising a "bunch".  Doing 30 min of cardio on home treadmill 4 days a week and does some weights 2 days a week.   Last visit, I talked to him about decreasing his alcohol content.  I checked lab work and his sodium was 128.  I asked him to follow-up with his primary care provider.  He did see his nurse practitioner and she felt that this was just chronic hyponatremia.  No falls but occasionally has "balance uncertainty", lightheadedness/near syncope, hallucinations since last visit.  Mood has been more irritable and wife states that she has noted a "real change."   Drinking 1-2 glasses of wine/night and sometimes will have "cocktail" as well.    06/28/16 update:  The patient follows up today, accompanied by his wife who supplements the history.  He is on no medication for this, as he has not wanted to be.  He has had no falls since last visit.  No lightheadedness or near syncope. He is doing cardio 5 days a week and feels "zapped" for the rest of the day.  Has had a little bit more tremor because of more stress.   In regards to alcohol, the patient states that he is drinking about the same, maybe a little less scotch.      07/30/16 update:  Pt f/u today.  He called his wife and she was in on the conference call.  I started him on Mirapex ER, and worked to 1.5 mg daily.  He called me and reported side effects with the medication.  He reported that he had a near passing out episode.  This occurred after drinking alcohol and his wife and friend had to help him out of the restaurant.  He called his pharmacist, who told him he should not be drinking alcohol with the medication.  We have discussed his alcohol intake several times in the past.  Ultimately, the patient ended up discontinuing his Mirapex ER.  States that it caused weight loss and diarrhea.  He does have a hx of UC and has a hx of diarrhea but not that often.  Pt concerned about low "BP."  Wife complains that he generally doesn't feel good.    09/14/16 update: The patient follows up today.  I have reviewed records since our last visit.  His losartan dose was initially dropped by half and about a week later, his primary care physician stopped the medication altogether.  The patient states that he is doing well off of the medication.  He has been keeping a log of it and brought it in for review  and the numbers looked good.  Otherwise, no changes since last visit besides for the fact that he had a URI.  States that he has had 3 rounds of antibiotics and still has productive cough in the AM.  Also c/o pain in the nose.  Saw ENT and was given ointment and not helpful.  Notes that has to purposefully swing arms to feel balanced.  No falls but some near falls.  R leg seems to "linger" when gets up and needs to turn - R leg isn't as fast to move.  Wife notes that he seems depressed.  Still drinking about same amount of EtOH.    10/14/16 update:  Patient follows up today, accompanied by his wife who supplements the history.  I spoke with his ENT physician prior to today's visit.  The patient is having lancinating pain in the nose and face.  He tried various creams and ointments  without relief and thought that perhaps this represented trigeminal neuralgia.  The patient states that the pain is located over the L nose and radiates around the L eye.  Pt states that it is a "jabbing pain."  It happens on and off all day long.  Chewing will make it worse as does looking down.  Doesn't know if cold will set it off.  Has had productive cough, but some better after abx x 3. No neuroimaging.   In regards to his Parkinson's, he was started on Neupro last visit and worked to 4 mg daily.  He thinks that medication worked well, without side effects.  Tremor improved. No compulsive behaviors.  No sleep attacks.  No hallucinations.  No lightheadedness or near syncope.  12/22/16 update:  Patient follows up today.  The patient remains on Neupro, 4 mg daily for Parkinson's disease. Having some skin irritation with the patch.   Patch is helping sx's.   No falls other that tripping over the dog.  No lightheadedness or near syncope.  No hallucinations.  No sleep attacks.  No compulsive behaviors.  His bigger issue has been trigeminal neuralgia.  We started him on Trileptal, which did seem to work, but caused hyponatremia.  We took him off of that and changed him to gabapentin, which did not help.  He is off of that now.  He got a surgical opinion from Dr. Cyndy Freeze on 11/22/2016.  Surgical decompression was offered given the MRI demonstrated AICA crossing near L trigeminal nerve root.  Pt wanted to think about that before proceeding and has not yet been back to neurosx.  Pain comes and goes.  Worse when in the shower and water hits it.    07/14/17 update:  Pt seen today in follow-up for his Parkinson's disease.  He is on Neupro, 4 mg daily.  He ran out for a day and he knew it was working.  He has no compulsive behaviors.  No sleep attacks.  No falls.  Worked out 6 days last week.  Reviewed records since our last visit.  He was in the hospital in September for chest pain.  Cardiology was consulted, but the  patient wanted to leave the hospital before they were actually able to see him.  He did see Dr. Claiborne Billings as an outpatient on October 4.  He felt chest pain was noncardiac in nature and perhaps related to anxiety or reflux.  He did just call here at the end of October and was concerned about memory and wanted neurocognitive testing.  We opted to discuss  this today.  States that his wife is having memory issues and he agreed to do some memory testing if she would.  He doesn't think that his memory is quite as good as in the past.   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  He changed new jobs a month ago.  He does think he is doing ok with this.  He did meet with Dr. Vertell Limber for [place] but he hasn't had any pain since but he states that he wanted to establish a relationship with him.    PREVIOUS MEDS:  mirapex (states that he had diarrhea but does have hx of UC and diarrhea isn't unusual for him; also had near passing out episode but had been drinking EtOH when it happened)   ALLERGIES:  No Known Allergies  CURRENT MEDICATIONS:  Outpatient Encounter Prescriptions as of 07/14/2017  Medication Sig  . aspirin (ASPIRIN CHILDRENS) 81 MG chewable tablet Chew 1 tablet (81 mg total) by mouth daily.  [name] dextromethorphan-guaiFENesin (MUCINEX DM) 30-600 MG 12hr tablet Take 1 tablet by mouth 2 (two) times daily as needed for cough.  . ferrous sulfate 325 (65 FE) MG tablet Take 325 mg by mouth daily with breakfast.    . losartan (COZAAR) 50 MG tablet Take 1 tablet (50 mg total) by mouth daily.  . Multiple Vitamin (MULTIVITAMIN WITH MINERALS) TABS tablet Take 1 tablet by mouth daily.  [name] omeprazole (PRILOSEC) 40 MG capsule Take 1 capsule (40 mg total) by mouth 2 (two) times daily.  . psyllium (METAMUCIL) 58.6 % powder Take 1 packet by mouth 2 (two) times daily. 1 TBS Twice Daily.  . rotigotine (NEUPRO) 4 MG/24HR place 1 patch ONTO THE SKIN once daily  .  Sildenafil Citrate (VIAGRA PO) Take by mouth. Reported on 11/12/2015  . sodium chloride (OCEAN) 0.65 % SOLN nasal spray Place 1 spray into both nostrils as needed for congestion.  . [DISCONTINUED] ibuprofen (ADVIL,MOTRIN) 200 MG tablet Take 400 mg by mouth every 6 (six) hours as needed for fever, mild pain or moderate pain.  . [DISCONTINUED] fluticasone (FLONASE) 50 MCG/ACT nasal spray Place 2 sprays into both nostrils daily.   No facility-administered encounter medications on file as of 07/14/2017.     PAST MEDICAL HISTORY:   Past Medical History:  Diagnosis Date  . Allergic rhinitis   . Anemia, iron deficiency   . Arthritis   . Blood transfusion without reported diagnosis   . Colonic stricture ([place])   . ED (erectile dysfunction)   . Esophageal stricture    Status post upper endoscopy with esophageal dilatation  . Gastric polyps   . GERD (gastroesophageal reflux disease)   . Hiatal hernia   . Hypertension   . Hyponatremia   . Inguinal hernia   . Kidney stone    x 2  . Parkinson disease ([place])   . Ulcerative colitis    status post total abdominal colectomy with ileoanal anastomosis    PAST SURGICAL HISTORY:   Past Surgical History:  Procedure Laterality Date  . COLECTOMY  1992   with ilial anal pull through (for ulcerative colitis)  . ETHMOIDECTOMY     and maxillary enterostomies '04, redo in 12/04  . INGUINAL HERNIA REPAIR    . KIDNEY STONE SURGERY     removed 2013  . KNEE ARTHROSCOPY Right 2004  . LASIK      SOCIAL HISTORY:   Social History  Social History  . Marital status: Married    Spouse name: N/A  . Number of children: N/A  . Years of education: N/A   Occupational History  . 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 Smoking status: Former Smoker    Quit date: 09/14/1979  . Smokeless tobacco: Never Used     Comment: smoked for 7 years  . Alcohol use 0.0 oz/week     Comment: 2 glasses of wine per night  . Drug use: No  . Sexual  activity: Yes   Other Topics Concern  . Not on file   Social History Narrative   [employer] grad   Married 13.5 years- divorced; married '05   No children   Work: Insurance underwriter   Patient is a former smoker. Quit in 1981   Alcohol use- yes social   Daily Caffeine use 2 cups coffee per day   Illicit drug use- no    FAMILY HISTORY:   Family Status  Relation Status  . Father Deceased       rectal cancer, multiple myeloma  . Mother Deceased       dementia, HTN  . Brother Alive       heart disease (identical)  . Brother Alive       sinus issues  . Neg Hx (Not Specified)    ROS:  A complete 10 system review of systems was obtained and was unremarkable apart from what is mentioned above.  PHYSICAL EXAMINATION:    VITALS:   Vitals:   07/14/17 1122  BP: 110/74  Pulse: (!) 54  SpO2: 98%  Weight: 169 lb (76.7 kg)  Height: 5' 7"  (1.702 m)   Wt Readings from Last 3 Encounters:  07/14/17 169 lb (76.7 kg)  07/05/17 171 lb 9.6 oz (77.8 kg)  06/16/17 168 lb 3.2 oz (76.3 kg)     GEN:  The patient appears stated age and is in NAD. HEENT:  Normocephalic, atraumatic.  The mucous membranes are moist. The superficial temporal arteries are without ropiness or tenderness. CV:  Bradycardic.  Regular rhythm.   Lungs:  CTAB Neck/HEME:  There are no carotid bruits bilaterally.  Neurological examination:  Orientation: The patient is alert and oriented x3.  Cranial nerves: There is good facial symmetry. There is facial hypomimiaPupils are equal round and reactive to light bilaterally. Fundoscopic exam reveals clear margins bilaterally. Extraocular muscles are intact. The visual fields are full to confrontational testing. The speech is fluent and clear. Soft palate rises symmetrically and there is no tongue deviation. Hearing is intact to conversational tone. Sensation: Sensation is intact to light and pinprick throughout (facial, trunk, extremities). Vibration is intact at the bilateral  big toe. There is no extinction with double simultaneous stimulation. There is no sensory dermatomal level identified. Motor: Strength is 5/5 in the bilateral upper and lower extremities.   Shoulder shrug is equal and symmetric.  There is no pronator drift.   Movement examination: Tone: There is normal tone Abnormal movements: There is rare tremor in the RUE and fingers on the L hand Coordination:  There is decremation with finger taps and toe taps on the both sides but it is really mild Gait and Station: The patient has no difficulty arising out of a deep-seated chair without the use of the hands. The patient's stride length is good with very purposeful arm swing. The patient has a negative pull test.      LABS  Lab Results  Component Value Date   TSH 2.20 04/06/2017     Chemistry      Component Value Date/Time   NA 131 (L) 05/28/2017 0231   K 3.4 (L) 05/28/2017 0231   CL 101 05/28/2017 0231   CO2 24 05/28/2017 0231   BUN 15 05/28/2017 0231   CREATININE 1.05 05/28/2017 0231   CREATININE 0.94 08/02/2016 0906      Component Value Date/Time   CALCIUM 8.6 (L) 05/28/2017 0231   ALKPHOS 37 (L) 05/27/2017 1138   AST 16 05/27/2017 1138   ALT 19 05/27/2017 1138   BILITOT 0.7 05/27/2017 1138     Lab Results  Component Value Date   WBC 3.8 (L) 05/28/2017   HGB 12.3 (L) 05/28/2017   HCT 35.7 (L) 05/28/2017   MCV 93.5 05/28/2017   PLT 158 05/28/2017   Lab Results  Component Value Date   VITAMINB12 589 08/02/2016     ASSESSMENT/PLAN:  1. Idiopathic Parkinson's disease.  The patient just barely meets [place] brain bank criteria but he does meet it.  -We discussed the diagnosis as well as pathophysiology of the disease again as wife not here last visit.  We discussed treatment options as well as prognostic indicators.  Patient education was provided.  -Not convinced that all of the reported side effects were from the pramipexole ER but since we have other options decided to hold on  reusing unless really need it  -continue neupro 4 mg daily as doing better on that .  He is in donut hole but I didn't have samples unfortunately.  Risks, benefits, side effects and alternative therapies were discussed.  The opportunity to ask questions was given and they were answered to the best of my ability.  The patient expressed understanding and willingness to follow the outlined treatment protocols.  -no further skin reactions  -asked me about azilect/levodopa and reviewed indications for those meds 2.  EtOH overuse  -has dropped to one glass per night, which is better and okay 3.  Hx HTN  -Now off of losartan   4.  Trigeminal neuralgia  -Trileptal caused hyponatremia.     -Gabapentin has not been beneficial.  -He got a surgical opinion from Dr. Cyndy Freeze on 11/22/2016.  Surgical decompression was offered given the MRI demonstrated AICA crossing near L trigeminal nerve root.  Pt had second opinion with Dr. Vertell Limber but hasn't had pain since so just holding for now 5.  Depression  -pt denies  6.  Memory change  -we will order neurocognitive testing.  I don't think that he has any degree of neuro degenerative memory loss.  It does sound like he is under a significant amount of stress at home and this may be contributing. 7.    I will see him back in the next few months, sooner should new neurologic issues arise.  Greater than 50% of the 25-minute visit was spent in counseling with the patient.

## 2017-07-14 ENCOUNTER — Ambulatory Visit (INDEPENDENT_AMBULATORY_CARE_PROVIDER_SITE_OTHER): Payer: Medicare HMO | Admitting: Neurology

## 2017-07-14 ENCOUNTER — Encounter: Payer: Self-pay | Admitting: Neurology

## 2017-07-14 VITALS — BP 110/74 | HR 54 | Ht 67.0 in | Wt 169.0 lb

## 2017-07-14 DIAGNOSIS — R413 Other amnesia: Secondary | ICD-10-CM | POA: Diagnosis not present

## 2017-07-14 DIAGNOSIS — G20A1 Parkinson's disease without dyskinesia, without mention of fluctuations: Secondary | ICD-10-CM

## 2017-07-14 DIAGNOSIS — G20 Parkinson's disease: Secondary | ICD-10-CM | POA: Diagnosis not present

## 2017-07-14 NOTE — Patient Instructions (Signed)
   You have been referred for a neurocognitive evaluation in our office.   The evaluation takes approximately two hours. The first part of the appointment is a clinical interview with the neuropsychologist (Dr. Macarthur Critchley). Please bring someone with you to this appointment if possible, as it is helpful for Dr. Si Raider to hear from both you and another adult who knows you well. After speaking with Dr. Si Raider, you will complete testing with her technician. The testing includes a variety of tasks- mostly question-and-answer, some paper-and-pencil. There is nothing you need to do to prepare for this appointment, but having a good night's sleep prior to the testing, and bringing eyeglasses and hearing aids (if you wear them), is advised.   About a week after the evaluation, you will return to follow up with Dr. Si Raider to review the test results. This appointment is about 30 minutes. If you would like a family member to receive this information as well, please bring them to the appointment.   We have to reserve several hours of the neuropsychologist's time and the psychometrician's time for your evaluation appointment. As such, please note that there is a No-Show fee of $100. If you are unable to attend any of your appointments, please contact our office as soon as possible to reschedule.

## 2017-08-10 DIAGNOSIS — Z87442 Personal history of urinary calculi: Secondary | ICD-10-CM | POA: Diagnosis not present

## 2017-08-10 DIAGNOSIS — G20 Parkinson's disease: Secondary | ICD-10-CM | POA: Diagnosis not present

## 2017-08-10 DIAGNOSIS — N401 Enlarged prostate with lower urinary tract symptoms: Secondary | ICD-10-CM | POA: Diagnosis not present

## 2017-08-10 DIAGNOSIS — N486 Induration penis plastica: Secondary | ICD-10-CM | POA: Diagnosis not present

## 2017-08-10 DIAGNOSIS — N529 Male erectile dysfunction, unspecified: Secondary | ICD-10-CM | POA: Diagnosis not present

## 2017-08-12 ENCOUNTER — Other Ambulatory Visit: Payer: Self-pay | Admitting: Neurology

## 2017-08-12 MED ORDER — ROTIGOTINE 4 MG/24HR TD PT24: MEDICATED_PATCH | TRANSDERMAL | 1 refills | Status: DC

## 2017-09-03 DIAGNOSIS — R69 Illness, unspecified: Secondary | ICD-10-CM | POA: Diagnosis not present

## 2017-09-21 ENCOUNTER — Encounter: Payer: Self-pay | Admitting: Cardiovascular Disease

## 2017-09-21 ENCOUNTER — Ambulatory Visit (INDEPENDENT_AMBULATORY_CARE_PROVIDER_SITE_OTHER): Payer: Medicare HMO | Admitting: Cardiovascular Disease

## 2017-09-21 VITALS — BP 130/66 | HR 61 | Ht 66.5 in | Wt 170.8 lb

## 2017-09-21 DIAGNOSIS — I10 Essential (primary) hypertension: Secondary | ICD-10-CM | POA: Diagnosis not present

## 2017-09-21 DIAGNOSIS — K219 Gastro-esophageal reflux disease without esophagitis: Secondary | ICD-10-CM

## 2017-09-21 DIAGNOSIS — R0789 Other chest pain: Secondary | ICD-10-CM | POA: Diagnosis not present

## 2017-09-21 DIAGNOSIS — G20 Parkinson's disease: Secondary | ICD-10-CM
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
## 2017-09-21 NOTE — Patient Instructions (Signed)
Medication Instructions:  Your physician recommends that you continue on your current medications as directed. Please refer to the Current Medication list given to you today.  Follow-Up: AS NEEDED with Dr. Claiborne Billings.  Any Other Special Instructions Will Be Listed Below (If Applicable).     If you need a refill on your cardiac medications before your next appointment, please call your pharmacy.

## 2017-09-23 ENCOUNTER — Encounter: Payer: Self-pay | Admitting: Cardiovascular Disease

## 2017-10-10 ENCOUNTER — Ambulatory Visit: Payer: Medicare HMO | Admitting: Psychology

## 2017-10-10 ENCOUNTER — Encounter: Payer: Self-pay | Admitting: Psychology

## 2017-10-10 DIAGNOSIS — G20 Parkinson's disease: Secondary | ICD-10-CM | POA: Diagnosis not present

## 2017-10-10 DIAGNOSIS — R413 Other amnesia: Secondary | ICD-10-CM

## 2017-10-10 NOTE — Progress Notes (Signed)
   Neuropsychology Note  [name] completed 120 minutes of neuropsychological testing with technician, Milana Kidney, BS, under the supervision of Dr. Macarthur Critchley, Licensed Psychologist. The patient did not appear overtly distressed by the testing session, per behavioral observation or via self-report to the technician. Rest breaks were offered.   Clinical Decision Making: In considering the patient's current level of functioning, level of presumed impairment, nature of symptoms, emotional and behavioral responses during the interview, level of literacy, and observed level of motivation/effort, a battery of tests was selected and communicated to the psychometrician.  Communication between the psychologist and technician was ongoing throughout the testing session and changes were made as deemed necessary based on patient performance on testing, technician observations and additional pertinent factors such as those listed above.  William [name] will return within approximately 2 weeks for an interactive feedback session with Dr. Si Raider at which time his test performances, clinical impressions and treatment recommendations will be reviewed in detail. The patient understands he can contact our office should he require our assistance before this time.  15 minutes spent performing neuropsychological evaluation services/clinical decision making (psychologist). [CPT 68341] 120 minutes spent face-to-face with patient administering standardized tests, 30 minutes spent scoring (technician). [CPT Y8200648, 96222]  Full report to follow.
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
## 2017-11-15 ENCOUNTER — Ambulatory Visit (INDEPENDENT_AMBULATORY_CARE_PROVIDER_SITE_OTHER): Payer: Medicare HMO | Admitting: Psychology

## 2017-11-15 ENCOUNTER — Encounter: Payer: Self-pay | Admitting: Psychology

## 2017-11-15 DIAGNOSIS — G3184 Mild cognitive impairment, so stated: Secondary | ICD-10-CM

## 2017-11-15 DIAGNOSIS — G20 Parkinson's disease: Secondary | ICD-10-CM | POA: Diagnosis not present

## 2017-11-18 ENCOUNTER — Encounter: Payer: Self-pay | Admitting: Family

## 2017-11-18 ENCOUNTER — Ambulatory Visit (INDEPENDENT_AMBULATORY_CARE_PROVIDER_SITE_OTHER): Payer: Medicare HMO | Admitting: Family

## 2017-11-18 VITALS — BP 132/74 | HR 63 | Temp 97.5°F | Ht 66.0 in | Wt 168.1 lb

## 2017-11-18 DIAGNOSIS — J019 Acute sinusitis, unspecified: Secondary | ICD-10-CM | POA: Diagnosis not present

## 2017-11-18 MED ORDER — AMOXICILLIN-POT CLAVULANATE 875-125 MG PO TABS: 1.0000 | ORAL_TABLET | Freq: Two times a day (BID) | ORAL | 0 refills | Status: DC

## 2017-11-18 MED ORDER — PREDNISONE 20 MG PO TABS: 20.0000 mg | ORAL_TABLET | Freq: Every day | ORAL | 0 refills | Status: DC

## 2017-11-18 NOTE — Progress Notes (Signed)
William [name] is a 67 y.o. male with the following history as recorded in [employer]:  Patient Active Problem List   Diagnosis Date Noted  . Chest pain 05/28/2017  . Hyperlipidemia 05/28/2017  . Bradycardia 05/28/2017  . Chest pain, atypical 05/27/2017  . Lightheadedness 05/27/2017  . Finger laceration 04/04/2017  . Acute bronchitis 08/09/2016  . PD (Parkinson's disease) ([place]) 02/26/2016  . Eustachian tube dysfunction 02/06/2016  . Hyponatremia 01/26/2016  . Tremor 11/12/2015  . Visual-spatial impairment 11/12/2015  . Stress at work 10/24/2014  . Nonspecific abnormal electrocardiogram (ECG) (EKG) 06/18/2014  . S/P colectomy 06/18/2014  . Well adult exam 06/18/2014  . Renal stones 06/18/2014  . Elevated BP 06/18/2014  . Headache(784.0) 04/25/2014  . Essential hypertension, benign 04/25/2014  . Serous otitis media 03/21/2014  . Travel advice encounter 04/17/2013  . Calcium nephrolithiasis 10/04/2012  . Routine health maintenance 10/04/2012  . Acute sinusitis 05/24/2012  . Unspecified intestinal obstruction 12/03/2008  . Other diseases of nasal cavity and sinuses(478.19) 05/30/2008  . GERD 02/26/2008  . PEPTIC STRICTURE 02/22/2008  . HIATAL HERNIA 02/22/2008  . Chest tightness or pressure 01/09/2008  . ANEMIA-IRON DEFICIENCY 04/07/2007  . ERECTILE DYSFUNCTION 04/07/2007  . Allergic rhinitis 04/07/2007  . COLITIS, ULCERATIVE NOS 04/07/2007  . GASTRIC POLYP, HX OF 04/07/2007    Current Outpatient Medications  Medication Sig Dispense Refill  . aspirin (ASPIRIN CHILDRENS) 81 MG chewable tablet Chew 1 tablet (81 mg total) by mouth daily. 36 tablet 11  . ferrous sulfate 325 (65 FE) MG tablet Take 325 mg by mouth daily with breakfast.      . losartan (COZAAR) 50 MG tablet Take 1 tablet (50 mg total) by mouth daily. 30 tablet 11  . Multiple Vitamin (MULTIVITAMIN WITH MINERALS) TABS tablet Take 1 tablet by mouth daily.    [name] omeprazole (PRILOSEC) 40 MG capsule Take 1 capsule (40 mg  total) by mouth 2 (two) times daily. 60 capsule 11  . psyllium (METAMUCIL) 58.6 % powder Take 1 packet by mouth 2 (two) times daily. 1 TBS Twice Daily.    . rotigotine (NEUPRO) 4 MG/24HR place 1 patch ONTO THE SKIN once daily 90 patch 1  . Sildenafil Citrate (VIAGRA PO) Take by mouth. Reported on 11/12/2015    . sodium chloride (OCEAN) 0.65 % SOLN nasal spray Place 1 spray into both nostrils as needed for congestion. 15 mL 0  . amoxicillin-clavulanate (AUGMENTIN) 875-125 MG tablet Take 1 tablet by mouth 2 (two) times daily. 20 tablet 0  . predniSONE (DELTASONE) 20 MG tablet Take 1 tablet (20 mg total) by mouth daily with breakfast. 5 tablet 0   No current facility-administered medications for this visit.     Allergies: Patient has no known allergies.  Past Medical History:  Diagnosis Date  . Allergic rhinitis   . Anemia, iron deficiency   . Arthritis   . Blood transfusion without reported diagnosis   . Colonic stricture ([place])   . ED (erectile dysfunction)   . Esophageal stricture    Status post upper endoscopy with esophageal dilatation  . Gastric polyps   . GERD (gastroesophageal reflux disease)   . Hiatal hernia   . Hypertension   . Hyponatremia   . Inguinal hernia   . Kidney stone    x 2  . Parkinson disease ([place])   . Ulcerative colitis    status post total abdominal colectomy with ileoanal anastomosis    Past Surgical History:  Procedure Laterality Date  . COLECTOMY  1992   with ilial anal pull through (for ulcerative colitis)  . ETHMOIDECTOMY     and maxillary enterostomies '04, redo in 12/04  . INGUINAL HERNIA REPAIR    . KIDNEY STONE SURGERY     removed 2013  . KNEE ARTHROSCOPY Right 2004  . LASIK      Family History  Problem Relation Age of Onset  . Cancer Father        rectal cancer and MM  . Rectal cancer Father   . Dementia Mother   . Hypertension Mother   . Heart disease Brother        valve repaired at 55  . Colon cancer Neg Hx   . Esophageal cancer  Neg Hx   . Stomach cancer Neg Hx     Social History   Tobacco Use  . Smoking status: Former Smoker    Last attempt to quit: 09/14/1979    Years since quitting: 38.2  . Smokeless tobacco: Never Used  . Tobacco comment: smoked for 7 years  Substance Use Topics  . Alcohol use: Yes    Alcohol/week: 0.0 oz    Comment: 2 glasses of wine per night    Subjective:  Left ear "feels clogged up" x 2-3 days; + sinus pressure; +prone to sinus infections; has been using OTC saline rinse with some benefit; does not like allergy nasal steroid sprays- causes nose bleeds; denies any chest pain or shortness of breath; no fever; Will be leaving for [facility] on Monday for 3 day work conference- will be flying;   Objective:  Vitals:   11/18/17 0827  BP: 132/74  Pulse: 63  Temp: (!) 97.5 F (36.4 C)  TempSrc: Oral  SpO2: 99%  Weight: 168 lb 1.9 oz (76.3 kg)  Height: 5' 6"  (1.676 m)    General: Well developed, well nourished, in no acute distress  Skin : Warm and dry.  Head: Normocephalic and atraumatic  Eyes: Sclera and conjunctiva clear; pupils round and reactive to light; extraocular movements intact  Ears: External normal; canals clear; tympanic membranes full, congested; left mildly bulging/ erythematous Oropharynx: Pink, supple. No suspicious lesions  Neck: Supple without thyromegaly, adenopathy  Lungs: Respirations unlabored; clear to auscultation bilaterally without wheeze, rales, rhonchi  CVS exam: normal rate and regular rhythm.  Neurologic: Alert and oriented; speech intact; face symmetrical; moves all extremities well; CNII-XII intact without focal deficit  Assessment:   1. Acute sinusitis, recurrence not specified, unspecified location     Plan:  Rx for Augmentin 875 mg bid x 10 days; Rx for Prednisone 20 mg qd x 5 days; increase fluids, rest and follow-up worse, no better.   No Follow-up on file.  No orders of the defined types were placed in this encounter.   Requested  Prescriptions   Signed Prescriptions Disp Refills  . amoxicillin-clavulanate (AUGMENTIN) 875-125 MG tablet 20 tablet 0    Sig: Take 1 tablet by mouth 2 (two) times daily.  . predniSONE (DELTASONE) 20 MG tablet 5 tablet 0    Sig: Take 1 tablet (20 mg total) by mouth daily with breakfast.

## 2017-12-03 ENCOUNTER — Ambulatory Visit (INDEPENDENT_AMBULATORY_CARE_PROVIDER_SITE_OTHER): Payer: Medicare HMO | Admitting: Family Medicine

## 2017-12-03 ENCOUNTER — Encounter: Payer: Self-pay | Admitting: Family Medicine

## 2017-12-03 VITALS — BP 116/76 | HR 64 | Temp 98.0°F | Ht 66.5 in | Wt 172.1 lb

## 2017-12-03 DIAGNOSIS — H66005 Acute suppurative otitis media without spontaneous rupture of ear drum, recurrent, left ear: Secondary | ICD-10-CM | POA: Diagnosis not present

## 2017-12-03 DIAGNOSIS — J0101 Acute recurrent maxillary sinusitis: Secondary | ICD-10-CM | POA: Diagnosis not present

## 2017-12-03 MED ORDER — CEFDINIR 300 MG PO CAPS: 300.0000 mg | ORAL_CAPSULE | Freq: Two times a day (BID) | ORAL | 0 refills | Status: DC

## 2017-12-03 MED ORDER — PREDNISONE 5 MG PO TABS: ORAL_TABLET | ORAL | 0 refills | Status: DC

## 2017-12-03 NOTE — Progress Notes (Signed)
History of Present Illness:   William [name] is a 67 y.o. male who presents for evaluation of possible sinus infection. Symptoms include achiness, cough described as productive of purulent sputum, post nasal drip, purulent nasal discharge, sinus pressure and left ear pain with no fever, chills, night sweats or weight loss. Onset of symptoms was 2 weeks ago, gradually worsening since that time.  He is drinking plenty of fluids. Patient is a non-smoker.  PMHx, SurgHx, SocialHx, Medications, and Allergies were reviewed in the Visit Navigator and updated as appropriate.   Past Medical History:   Past Medical History:  Diagnosis Date  . Allergic rhinitis   . Anemia, iron deficiency   . Arthritis   . Blood transfusion without reported diagnosis   . Colonic stricture ([place])   . ED (erectile dysfunction)   . Esophageal stricture    Status post upper endoscopy with esophageal dilatation  . Gastric polyps   . GERD (gastroesophageal reflux disease)   . Hiatal hernia   . Hypertension   . Hyponatremia   . Inguinal hernia   . Kidney stone    x 2  . Parkinson disease ([place])   . Ulcerative colitis    status post total abdominal colectomy with ileoanal anastomosis     Past Surgical History:   Past Surgical History:  Procedure Laterality Date  . COLECTOMY  1992   with ilial anal pull through (for ulcerative colitis)  . ETHMOIDECTOMY     and maxillary enterostomies '04, redo in 12/04  . INGUINAL HERNIA REPAIR    . KIDNEY STONE SURGERY     removed 2013  . KNEE ARTHROSCOPY Right 2004  . LASIK       Allergies:  No Known Allergies   Current Medications:   Prior to Admission medications   Medication Sig Start Date End Date Taking? Authorizing Provider  aspirin (ASPIRIN CHILDRENS) 81 MG chewable tablet Chew 1 tablet (81 mg total) by mouth daily. 10/23/14  Yes Plotnikov, Evie Lacks, MD  ferrous sulfate 325 (65 FE) MG tablet Take 325 mg by mouth daily with breakfast.     Yes [provider]  losartan (COZAAR) 50 MG tablet Take 1 tablet (50 mg total) by mouth daily. 06/03/17  Yes Plotnikov, Evie Lacks, MD  Multiple Vitamin (MULTIVITAMIN WITH MINERALS) TABS tablet Take 1 tablet by mouth daily.   Yes [provider]  omeprazole (PRILOSEC) 40 MG capsule Take 1 capsule (40 mg total) by mouth 2 (two) times daily. 07/05/17  Yes Irene Shipper, MD  psyllium (METAMUCIL) 58.6 % powder Take 1 packet by mouth 2 (two) times daily. 1 TBS Twice Daily.   Yes [provider]  rotigotine (NEUPRO) 4 MG/24HR place 1 patch ONTO THE SKIN once daily 08/12/17  Yes Tat, Eustace Quail, DO  Sildenafil Citrate (VIAGRA PO) Take by mouth. Reported on 11/12/2015   Yes [provider]  sodium chloride (OCEAN) 0.65 % SOLN nasal spray Place 1 spray into both nostrils as needed for congestion. 06/07/17  Yes Nche, Charlene Brooke, NP     Social History:   Social History   Tobacco Use  . Smoking status: Former Smoker    Last attempt to quit: 09/14/1979    Years since quitting: 38.2  . Smokeless tobacco: Never Used  . Tobacco comment: smoked for 7 years  Substance Use Topics  . Alcohol use: Yes    Alcohol/week: 0.0 oz    Comment: 2 glasses of wine per night  .  Drug use: No    Review of Systems:   No unusual headaches, no dizziness. No dyspnea or chest pain on exertion. No abdominal pain, change in bowel habits, black or bloody stools.  No urinary tract symptoms. No new or unusual musculoskeletal symptoms. No edema.  Vitals:   Vitals:   12/03/17 1033  BP: 116/76  Pulse: 64  Temp: 98 F (36.7 C)  TempSrc: Oral  SpO2: 98%  Weight: 172 lb 1.9 oz (78.1 kg)  Height: 5' 6.5" (1.689 m)     Body mass index is 27.36 kg/m.  Physical Exam:   General: alert, cooperative, in NAD Eyes: PERRL, EOMI, conjunctiva clear HENT: left TM red and bulging, moist mucous membranes, posterior pharyngeal erythema, PND, bilateral maxillary and frontal sinus ttp Cardiovascular: regular rate  and rhythm, S1, S2 normal, no murmur, click, rub or gallop Respiratory: CTAB Abdomen: soft, non-tender; bowel sounds normal; no masses,  no organomegaly Extremities: extremities normal, atraumatic, no cyanosis or edema Pulses: 2+ and symmetric Skin: Skin color, texture, turgor normal. No rashes or lesions  Assessment and Plan:   Diagnoses and all orders for this visit:  Recurrent acute suppurative otitis media without spontaneous rupture of left tympanic membrane -     cefdinir (OMNICEF) 300 MG capsule; Take 1 capsule (300 mg total) by mouth 2 (two) times daily. -     predniSONE (DELTASONE) 5 MG tablet; 6-5-4-3-2-1-off  Acute recurrent maxillary sinusitis -     cefdinir (OMNICEF) 300 MG capsule; Take 1 capsule (300 mg total) by mouth 2 (two) times daily. -     predniSONE (DELTASONE) 5 MG tablet; 6-5-4-3-2-1-off   . Reviewed expectations re: course of current medical issues. . Discussed self-management of symptoms. . Outlined signs and symptoms indicating need for more acute intervention. . Patient verbalized understanding and all questions were answered. . See orders for this visit as documented in the electronic medical record. . Patient received an After Visit Summary.  Briscoe Deutscher, D.O.

## 2017-12-08 ENCOUNTER — Encounter: Payer: Medicare HMO | Admitting: Psychology

## 2017-12-12 ENCOUNTER — Encounter: Payer: Self-pay | Admitting: Family Medicine

## 2017-12-12 ENCOUNTER — Ambulatory Visit (INDEPENDENT_AMBULATORY_CARE_PROVIDER_SITE_OTHER): Payer: Medicare HMO | Admitting: Family Medicine

## 2017-12-12 VITALS — BP 124/78 | HR 64 | Temp 99.1°F | Ht 66.5 in | Wt 170.4 lb

## 2017-12-12 DIAGNOSIS — H6592 Unspecified nonsuppurative otitis media, left ear: Secondary | ICD-10-CM | POA: Diagnosis not present

## 2017-12-12 MED ORDER — IPRATROPIUM BROMIDE 0.06 % NA SOLN: 2.0000 | Freq: Four times a day (QID) | NASAL | 0 refills | Status: DC

## 2017-12-12 MED ORDER — PREDNISONE 50 MG PO TABS: ORAL_TABLET | ORAL | 0 refills | Status: DC

## 2017-12-12 MED ORDER — DOXYCYCLINE HYCLATE 100 MG PO TABS: 100.0000 mg | ORAL_TABLET | Freq: Two times a day (BID) | ORAL | 0 refills | Status: DC

## 2017-12-12 NOTE — Progress Notes (Signed)
    Subjective:  William [name] is a 67 y.o. male who presents today for same-day appointment with a chief complaint of ear [name].   HPI:  Ear [name], established problem, uncontrolled.  Patient seen 9 days ago for otitis media.  Was prescribed Omnicef and prednisone.  A few weeks prior to this, he was prescribed Augmentin for a sinus infection.  None of these medications seem to have helped with his symptoms.  Still has a significant amount of [name] to his left ear.  ROS: Per HPI  Objective:  Physical Exam: BP 124/78 (BP Location: Left Arm, Patient Position: Sitting, Cuff Size: Normal)   Pulse 64   Temp 99.1 F (37.3 C) (Oral)   Ht 5' 6.5" (1.689 m)   Wt 170 lb 6.4 oz (77.3 kg)   SpO2 99%   BMI 27.09 kg/m   Gen: NAD, resting comfortably HEENT: Right TM clear.  Left TM erythematous with white effusion.  Assessment/Plan:  Left otitis media Symptoms not improving despite treatment with Augmentin and Omnicef.  We will start a 1 week course of doxycycline today to cover for MRSA and other gram positives.  We will also start Atrovent to alleviate sinus/nasal congestion that could be contributing.  Also start a 5-day burst of prednisone.  If symptoms not improving despite treatment with the above, will need ENT referral.  Return precautions reviewed.  William [name]. William Pain, MD 12/12/2017 11:30 AM

## 2018-02-01 DIAGNOSIS — L570 Actinic keratosis: Secondary | ICD-10-CM | POA: Diagnosis not present

## 2018-02-01 DIAGNOSIS — L84 Corns and callosities: Secondary | ICD-10-CM | POA: Diagnosis not present

## 2018-02-01 DIAGNOSIS — L821 Other seborrheic keratosis: Secondary | ICD-10-CM | POA: Diagnosis not present

## 2018-02-01 DIAGNOSIS — X32XXXD Exposure to sunlight, subsequent encounter: Secondary | ICD-10-CM | POA: Diagnosis not present

## 2018-02-02 ENCOUNTER — Other Ambulatory Visit: Payer: Self-pay | Admitting: Neurology

## 2018-02-02 MED ORDER — ROTIGOTINE 4 MG/24HR TD PT24: MEDICATED_PATCH | TRANSDERMAL | 0 refills | Status: DC

## 2018-02-08 DIAGNOSIS — M545 Low back pain: Secondary | ICD-10-CM | POA: Diagnosis not present

## 2018-02-08 DIAGNOSIS — M542 Cervicalgia: Secondary | ICD-10-CM | POA: Diagnosis not present

## 2018-02-08 DIAGNOSIS — M9903 Segmental and somatic dysfunction of lumbar region: Secondary | ICD-10-CM | POA: Diagnosis not present

## 2018-02-08 DIAGNOSIS — M9901 Segmental and somatic dysfunction of cervical region: Secondary | ICD-10-CM | POA: Diagnosis not present

## 2018-02-09 DIAGNOSIS — M9903 Segmental and somatic dysfunction of lumbar region: Secondary | ICD-10-CM | POA: Diagnosis not present

## 2018-02-09 DIAGNOSIS — M545 Low back pain: Secondary | ICD-10-CM | POA: Diagnosis not present

## 2018-02-09 DIAGNOSIS — M9901 Segmental and somatic dysfunction of cervical region: Secondary | ICD-10-CM | POA: Diagnosis not present

## 2018-02-09 DIAGNOSIS — M542 Cervicalgia: Secondary | ICD-10-CM | POA: Diagnosis not present

## 2018-02-15 DIAGNOSIS — M25561 Pain in right knee: Secondary | ICD-10-CM | POA: Diagnosis not present

## 2018-02-22 NOTE — Progress Notes (Signed)
William [name] was seen today in the movement disorders clinic for neurologic consultation at the request of Plotnikov, Evie Lacks, MD.  The consultation is for the evaluation of tremor and gait instability.  Tremor is in both hands, but it is more in the R hand.  Pt states that he thought it was going on for 3 months but when he talked to his brother, he said he noted jaw tremor last summer.  He notes it at rest.  He also noted some shuffling.     02/26/16 update:  The patient follows up today regarding his new diagnosis of Parkinson's disease.  His wife accompanies him today and supplements the history.  He admits that there was an "emotional adjustment" to make over the diagnosis.  He states that the "jittery feeling" goes "up and down."  He is currently on no medications.  He has been exercising a "bunch".  Doing 30 min of cardio on home treadmill 4 days a week and does some weights 2 days a week.   Last visit, I talked to him about decreasing his alcohol content.  I checked lab work and his sodium was 128.  I asked him to follow-up with his primary care provider.  He did see his nurse practitioner and she felt that this was just chronic hyponatremia.  No falls but occasionally has "balance uncertainty", lightheadedness/near syncope, hallucinations since last visit.  Mood has been more irritable and wife states that she has noted a "real change."   Drinking 1-2 glasses of wine/night and sometimes will have "cocktail" as well.    06/28/16 update:  The patient follows up today, accompanied by his wife who supplements the history.  He is on no medication for this, as he has not wanted to be.  He has had no falls since last visit.  No lightheadedness or near syncope. He is doing cardio 5 days a week and feels "zapped" for the rest of the day.  Has had a little bit more tremor because of more stress.   In regards to alcohol, the patient states that he is drinking about the same, maybe a little less scotch.      07/30/16 update:  Pt f/u today.  He called his wife and she was in on the conference call.  I started him on Mirapex ER, and worked to 1.5 mg daily.  He called me and reported side effects with the medication.  He reported that he had a near passing out episode.  This occurred after drinking alcohol and his wife and friend had to help him out of the restaurant.  He called his pharmacist, who told him he should not be drinking alcohol with the medication.  We have discussed his alcohol intake several times in the past.  Ultimately, the patient ended up discontinuing his Mirapex ER.  States that it caused weight loss and diarrhea.  He does have a hx of UC and has a hx of diarrhea but not that often.  Pt concerned about low "BP."  Wife complains that he generally doesn't feel good.    09/14/16 update: The patient follows up today.  I have reviewed records since our last visit.  His losartan dose was initially dropped by half and about a week later, his primary care physician stopped the medication altogether.  The patient states that he is doing well off of the medication.  He has been keeping a log of it and brought it in for review  and the numbers looked good.  Otherwise, no changes since last visit besides for the fact that he had a URI.  States that he has had 3 rounds of antibiotics and still has productive cough in the AM.  Also c/o pain in the nose.  Saw ENT and was given ointment and not helpful.  Notes that has to purposefully swing arms to feel balanced.  No falls but some near falls.  R leg seems to "linger" when gets up and needs to turn - R leg isn't as fast to move.  Wife notes that he seems depressed.  Still drinking about same amount of EtOH.    10/14/16 update:  Patient follows up today, accompanied by his wife who supplements the history.  I spoke with his ENT physician prior to today's visit.  The patient is having lancinating pain in the nose and face.  He tried various creams and ointments  without relief and thought that perhaps this represented trigeminal neuralgia.  The patient states that the pain is located over the L nose and radiates around the L eye.  Pt states that it is a "jabbing pain."  It happens on and off all day long.  Chewing will make it worse as does looking down.  Doesn't know if cold will set it off.  Has had productive cough, but some better after abx x 3. No neuroimaging.   In regards to his Parkinson's, he was started on Neupro last visit and worked to 4 mg daily.  He thinks that medication worked well, without side effects.  Tremor improved. No compulsive behaviors.  No sleep attacks.  No hallucinations.  No lightheadedness or near syncope.  12/22/16 update:  Patient follows up today.  The patient remains on Neupro, 4 mg daily for Parkinson's disease. Having some skin irritation with the patch.   Patch is helping sx's.   No falls other that tripping over the dog.  No lightheadedness or near syncope.  No hallucinations.  No sleep attacks.  No compulsive behaviors.  His bigger issue has been trigeminal neuralgia.  We started him on Trileptal, which did seem to work, but caused hyponatremia.  We took him off of that and changed him to gabapentin, which did not help.  He is off of that now.  He got a surgical opinion from Dr. Cyndy Freeze on 11/22/2016.  Surgical decompression was offered given the MRI demonstrated AICA crossing near L trigeminal nerve root.  Pt wanted to think about that before proceeding and has not yet been back to neurosx.  Pain comes and goes.  Worse when in the shower and water hits it.    07/14/17 update:  Pt seen today in follow-up for his Parkinson's disease.  He is on Neupro, 4 mg daily.  He ran out for a day and he knew it was working.  He has no compulsive behaviors.  No sleep attacks.  No falls.  Worked out 6 days last week.  Reviewed records since our last visit.  He was in the hospital in September for chest pain.  Cardiology was consulted, but the  patient wanted to leave the hospital before they were actually able to see him.  He did see Dr. Claiborne Billings as an outpatient on October 4.  He felt chest pain was noncardiac in nature and perhaps related to anxiety or reflux.  He did just call here at the end of October and was concerned about memory and wanted neurocognitive testing.  We opted to discuss  this today.  States that his wife is having memory issues and he agreed to do some memory testing if she would.  He doesn't think that his memory is quite as good as in the past.   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  He changed new jobs a month ago.  He does think he is doing ok with this.  He did meet with Dr. Vertell Limber for [place] but he hasn't had any pain since but he states that he wanted to establish a relationship with him.    02/24/18 update: Patient is seen today in follow-up for Parkinson's disease.  I have not seen him since last November.  This patient is accompanied in the office by his spouse who supplements the history.  His daughter is also on the phone.   He is currently on Neupro, 4 mg daily.  No site reactions.  No compulsive behaviors.  No sleep attacks.  No hallucinations.  No falls but notes balance isn't as good.  He is noting more tremor.  He is noting start hesitation and "some short choppy steps."    He did have neurocognitive testing with Dr. Si Raider on October 10, 2017.  I have reviewed this.  There was no dementia.  There was evidence of mild cognitive impairment.  Patient has been to primary care and cardiology since last visit.  Those records are reviewed.  No follow-up was needed from a cardiology standpoint.  Chest pain had atypical features.  He is doing cardio exercises 3 days per week.    PREVIOUS MEDS:  mirapex (states that he had diarrhea but does have hx of UC and diarrhea isn't unusual for him; also had near passing out episode but had been drinking EtOH when it  happened)   ALLERGIES:  No Known Allergies  CURRENT MEDICATIONS:  Outpatient Encounter Medications as of 02/24/2018  Medication Sig  . aspirin (ASPIRIN CHILDRENS) 81 MG chewable tablet Chew 1 tablet (81 mg total) by mouth daily.  [name] doxycycline (VIBRA-TABS) 100 MG tablet Take 1 tablet (100 mg total) by mouth 2 (two) times daily.  . ferrous sulfate 325 (65 FE) MG tablet Take 325 mg by mouth daily with breakfast.    . ipratropium (ATROVENT) 0.06 % nasal spray Place 2 sprays into both nostrils 4 (four) times daily.  [name] losartan (COZAAR) 50 MG tablet Take 1 tablet (50 mg total) by mouth daily.  . Multiple Vitamin (MULTIVITAMIN WITH MINERALS) TABS tablet Take 1 tablet by mouth daily.  [name] omeprazole (PRILOSEC) 40 MG capsule Take 1 capsule (40 mg total) by mouth 2 (two) times daily.  . predniSONE (DELTASONE) 50 MG tablet Take 1 tablet daily for 5 days.  [name] psyllium (METAMUCIL) 58.6 % powder Take 1 packet by mouth 2 (two) times daily. 1 TBS Twice Daily.  . rotigotine (NEUPRO) 4 MG/24HR place 1 patch ONTO THE SKIN once daily  . Sildenafil Citrate (VIAGRA PO) Take by mouth. Reported on 11/12/2015  . sodium chloride (OCEAN) 0.65 % SOLN nasal spray Place 1 spray into both nostrils as needed for congestion.  . [DISCONTINUED] rotigotine (NEUPRO) 4 MG/24HR place 1 patch ONTO THE SKIN once daily  . carbidopa-levodopa (SINEMET IR) 25-100 MG tablet Take 1 tablet by mouth 3 (three) times daily.   No facility-administered encounter medications on file as of 02/24/2018.     PAST MEDICAL HISTORY:   Past Medical History:  Diagnosis Date  .  Allergic rhinitis   . Anemia, iron deficiency   . Arthritis   . Blood transfusion without reported diagnosis   . Colonic stricture ([place])   . ED (erectile dysfunction)   . Esophageal stricture    Status post upper endoscopy with esophageal dilatation  . Gastric polyps   . GERD (gastroesophageal reflux disease)   . Hiatal hernia   . Hypertension   . Hyponatremia   .  Inguinal hernia   . Kidney stone    x 2  . Parkinson disease ([place])   . Ulcerative colitis    status post total abdominal colectomy with ileoanal anastomosis    PAST SURGICAL HISTORY:   Past Surgical History:  Procedure Laterality Date  . COLECTOMY  1992   with ilial anal pull through (for ulcerative colitis)  . ETHMOIDECTOMY     and maxillary enterostomies '04, redo in 12/04  . INGUINAL HERNIA REPAIR    . KIDNEY STONE SURGERY     removed 2013  . KNEE ARTHROSCOPY Right 2004  . LASIK      SOCIAL HISTORY:   Social History   Socioeconomic History  . Marital status: Married    Spouse name: Not on file  . Number of children: Not on file  . Years of education: Not on file  . Highest education level: Not on file  Occupational History  . Occupation: Public house manager: [employer]  Social Needs  . Financial resource strain: Not on file  . Food insecurity:    Worry: Not on file    Inability: Not on file  . Transportation needs:    Medical: Not on file    Non-medical: Not on file  Tobacco Use  . Smoking status: Former Smoker    Last attempt to quit: 09/14/1979    Years since quitting: 38.4  . Smokeless tobacco: Never Used  . Tobacco comment: smoked for 7 years  Substance and Sexual Activity  . Alcohol use: Yes    Alcohol/week: 0.0 oz    Comment: 2 glasses of wine per night  . Drug use: No  . Sexual activity: Yes  Lifestyle  . Physical activity:    Days per week: Not on file    Minutes per session: Not on file  . Stress: Not on file  Relationships  . Social connections:    Talks on phone: Not on file    Gets together: Not on file    Attends religious service: Not on file    Active member of club or organization: Not on file    Attends meetings of clubs or organizations: Not on file    Relationship status: Not on file  . Intimate partner violence:    Fear of current or ex partner: Not on file    Emotionally abused: Not on file    Physically abused:  Not on file    Forced sexual activity: Not on file  Other Topics Concern  . Not on file  Social History Narrative   [employer] grad   Married 13.5 years- divorced; married '05   No children   Work: Insurance underwriter   Patient is a former smoker. Quit in 1981   Alcohol use- yes social   Daily Caffeine use 2 cups coffee per day   Illicit drug use- no    FAMILY HISTORY:   Family Status  Relation Name Status  . Father  Deceased       rectal cancer, multiple myeloma  .  Mother 81 Deceased       dementia, HTN  . Brother twin Alive       heart disease (identical)  . Brother  Alive       sinus issues  . Neg Hx  (Not Specified)    ROS:  A complete 10 system review of systems was obtained and was unremarkable apart from what is mentioned above.  PHYSICAL EXAMINATION:    VITALS:   Vitals:   02/24/18 1122  BP: (!) 148/80  Pulse: (!) 59  SpO2: 98%  Weight: 173 lb (78.5 kg)  Height: _0  (1.676 m)   Wt Readings from Last 3 Encounters:  02/24/18 173 lb (78.5 kg)  12/12/17 170 lb 6.4 oz (77.3 kg)  12/03/17 172 lb 1.9 oz (78.1 kg)   GEN:  The patient appears stated age and is in NAD. HEENT:  Normocephalic, atraumatic.  The mucous membranes are moist. The superficial temporal arteries are without ropiness or tenderness. CV:  RRR Lungs:  CTAB Neck/HEME:  There are no carotid bruits bilaterally.  Neurological examination:  Orientation: The patient is alert and oriented x3. Cranial nerves: There is good facial symmetry. There is facial hypomimia.  The speech is fluent and clear. Soft palate rises symmetrically and there is no tongue deviation. Hearing is intact to conversational tone. Sensation: Sensation is intact to light touch throughout Motor: Strength is 5/5 in the bilateral upper and lower extremities.   Shoulder shrug is equal and symmetric.  There is no pronator drift.  Movement examination: Tone: There is mild increased tone in the bilateral UE, R more than L Abnormal  movements: Can be felt in both upper extremities at rest. Coordination:  There is mild decremation with RAM's with finger taps, heel taps and toe taps, R more than L Gait and Station: The patient has no difficulty arising out of a deep-seated chair without the use of the hands. The patient's stride length is with full arm swing when I am testing him in the hall.  However, I did watch him walk again and there was virtually no arm swing bilaterally with just slightly shorter stride length.  The patient has a negative pull test.      GEN:  The patient appears stated age and is in NAD. HEENT:  Normocephalic, atraumatic.  The mucous membranes are moist. The superficial temporal arteries are without ropiness or tenderness. CV:  Bradycardic.  Regular rhythm.   Lungs:  CTAB Neck/HEME:  There are no carotid bruits bilaterally.  Neurological examination:    LABS  Lab Results  Component Value Date   TSH 2.20 04/06/2017     Chemistry      Component Value Date/Time   NA 131 (L) 05/28/2017 0231   K 3.4 (L) 05/28/2017 0231   CL 101 05/28/2017 0231   CO2 24 05/28/2017 0231   BUN 15 05/28/2017 0231   CREATININE 1.05 05/28/2017 0231   CREATININE 0.94 08/02/2016 0906      Component Value Date/Time   CALCIUM 8.6 (L) 05/28/2017 0231   ALKPHOS 37 (L) 05/27/2017 1138   AST 16 05/27/2017 1138   ALT 19 05/27/2017 1138   BILITOT 0.7 05/27/2017 1138     Lab Results  Component Value Date   WBC 3.8 (L) 05/28/2017   HGB 12.3 (L) 05/28/2017   HCT 35.7 (L) 05/28/2017   MCV 93.5 05/28/2017   PLT 158 05/28/2017   Lab Results  Component Value Date   VITAMINB12 589 08/02/2016  ASSESSMENT/PLAN:  1. Idiopathic Parkinson's disease.  The patient just barely meets [place] brain bank criteria but he does meet it.  -We discussed the diagnosis as well as pathophysiology of the disease again as wife not here last visit.  We discussed treatment options as well as prognostic indicators.  Patient education  was provided.  -Not convinced that all of the reported side effects were from the pramipexole ER but since we have other options decided to hold on reusing unless really need it  -continue neupro 4 mg daily.  Risks, benefits, side effects and alternative therapies were discussed.  The opportunity to ask questions was given and they were answered to the best of my ability.  The patient expressed understanding and willingness to follow the outlined treatment protocols.  -Do think it is time to start pramipexole.  He and I discussed extensively risks, benefits, and side effects.  His daughter was on the telephone.  In addition to rigidity, it should provide him with continued ability to work.  Discussed risk for dyskinesias.  -no further skin reactions  -Discussed importance of exercise. 2.  EtOH overuse  -has dropped to one glass per night, which is better and okay 3.  Hx HTN  -Now off of losartan   4.  Trigeminal neuralgia  -Trileptal caused hyponatremia.     -Gabapentin has not been beneficial.  -He got a surgical opinion from Dr. Cyndy Freeze on 11/22/2016.  Surgical decompression was offered given the MRI demonstrated AICA crossing near L trigeminal nerve root.  Pt had second opinion with Dr. Vertell Limber but hasn't had pain since so just holding for now 5.  Depression  -pt denies  6.  Memory change  -Neurocognitive testing done in January, 2019 demonstrated mild cognitive impairment.  No evidence of dementia. 7.    F/u 3-4 months.  Much greater than 50% of this visit was spent in counseling and coordinating care.  Total face to face time:  25 min

## 2018-02-24 ENCOUNTER — Other Ambulatory Visit: Payer: Self-pay

## 2018-02-24 ENCOUNTER — Encounter: Payer: Self-pay | Admitting: Neurology

## 2018-02-24 ENCOUNTER — Ambulatory Visit: Payer: Medicare HMO | Admitting: Neurology

## 2018-02-24 VITALS — BP 148/80 | HR 59 | Ht 66.0 in | Wt 173.0 lb

## 2018-02-24 DIAGNOSIS — G20 Parkinson's disease: Secondary | ICD-10-CM

## 2018-02-24 MED ORDER — CARBIDOPA-LEVODOPA 25-100 MG PO TABS: 1.0000 | ORAL_TABLET | Freq: Three times a day (TID) | ORAL | 1 refills | Status: DC

## 2018-02-24 MED ORDER — ROTIGOTINE 4 MG/24HR TD PT24: MEDICATED_PATCH | TRANSDERMAL | 5 refills | Status: DC

## 2018-02-24 NOTE — Patient Instructions (Signed)
1. Start Carbidopa Levodopa as follows:  Take 1/2 tablet three times daily, at least 30 minutes before meals, for one week  Then take 1/2 tablet in the morning, 1/2 tablet in the afternoon, 1 tablet in the evening, at least 30 minutes before meals, for one week  Then take 1/2 tablet in the morning, 1 tablet in the afternoon, 1 tablet in the evening, at least 30 minutes before meals, for one week  Then take 1 tablet three times daily, at least 30 minutes before meals

## 2018-03-10 ENCOUNTER — Telehealth: Payer: Self-pay | Admitting: Podiatry

## 2018-03-10 NOTE — Telephone Encounter (Signed)
Pt left voicemail about possibly refurbishing his orthotics that he got in 2009 from Dr Blenda Mounts.  I returned call and had bad connection. Pt called back and I told him we could refurbish them for 90.00 or if he wanted a new pair he would have to see one of our providers and the cost for the orthotics is 398.00.  I did explain if we are refurbishing that when he drops off the orthotics the 90.00 has to be paid then and I would call him when they came back in so he could pick them up.

## 2018-03-28 DIAGNOSIS — M222X1 Patellofemoral disorders, right knee: Secondary | ICD-10-CM | POA: Diagnosis not present

## 2018-04-05 DIAGNOSIS — M222X1 Patellofemoral disorders, right knee: Secondary | ICD-10-CM | POA: Diagnosis not present

## 2018-04-12 ENCOUNTER — Ambulatory Visit (INDEPENDENT_AMBULATORY_CARE_PROVIDER_SITE_OTHER): Payer: Medicare HMO | Admitting: Family

## 2018-04-12 ENCOUNTER — Encounter: Payer: Self-pay | Admitting: Family

## 2018-04-12 VITALS — BP 130/82 | HR 81 | Temp 97.7°F | Ht 66.0 in | Wt 179.0 lb

## 2018-04-12 DIAGNOSIS — J019 Acute sinusitis, unspecified: Secondary | ICD-10-CM | POA: Diagnosis not present

## 2018-04-12 MED ORDER — PREDNISONE 20 MG PO TABS: 40.0000 mg | ORAL_TABLET | Freq: Every day | ORAL | 0 refills | Status: DC

## 2018-04-12 MED ORDER — BETAMETHASONE DIPROPIONATE 0.05 % EX CREA: TOPICAL_CREAM | Freq: Two times a day (BID) | CUTANEOUS | 0 refills | Status: DC

## 2018-04-12 MED ORDER — DOXYCYCLINE HYCLATE 100 MG PO TABS
100.0000 mg | ORAL_TABLET | Freq: Two times a day (BID) | ORAL | 0 refills | Status: DC
Start: 2018-04-12 — End: 2018-06-27

## 2018-04-12 NOTE — Progress Notes (Signed)
William [name] is a 67 y.o. male with the following history as recorded in [employer]:  Patient Active Problem List   Diagnosis Date Noted  . Chest pain 05/28/2017  . Hyperlipidemia 05/28/2017  . Bradycardia 05/28/2017  . Chest pain, atypical 05/27/2017  . Lightheadedness 05/27/2017  . Finger laceration 04/04/2017  . Acute bronchitis 08/09/2016  . PD (Parkinson's disease) ([place]) 02/26/2016  . Eustachian tube dysfunction 02/06/2016  . Hyponatremia 01/26/2016  . Tremor 11/12/2015  . Visual-spatial impairment 11/12/2015  . Stress at work 10/24/2014  . Nonspecific abnormal electrocardiogram (ECG) (EKG) 06/18/2014  . S/P colectomy 06/18/2014  . Well adult exam 06/18/2014  . Renal stones 06/18/2014  . Elevated BP 06/18/2014  . Headache(784.0) 04/25/2014  . Essential hypertension, benign 04/25/2014  . Serous otitis media 03/21/2014  . Travel advice encounter 04/17/2013  . Calcium nephrolithiasis 10/04/2012  . Routine health maintenance 10/04/2012  . Acute sinusitis 05/24/2012  . Unspecified intestinal obstruction 12/03/2008  . Other diseases of nasal cavity and sinuses(478.19) 05/30/2008  . GERD 02/26/2008  . PEPTIC STRICTURE 02/22/2008  . HIATAL HERNIA 02/22/2008  . Chest tightness or pressure 01/09/2008  . ANEMIA-IRON DEFICIENCY 04/07/2007  . ERECTILE DYSFUNCTION 04/07/2007  . Allergic rhinitis 04/07/2007  . COLITIS, ULCERATIVE NOS 04/07/2007  . GASTRIC POLYP, HX OF 04/07/2007    Current Outpatient Medications  Medication Sig Dispense Refill  . carbidopa-levodopa (SINEMET IR) 25-100 MG tablet Take 1 tablet by mouth 3 (three) times daily. 270 tablet 1  . ferrous sulfate 325 (65 FE) MG tablet Take 325 mg by mouth daily with breakfast.      . losartan (COZAAR) 50 MG tablet Take 1 tablet (50 mg total) by mouth daily. 30 tablet 11  . Multiple Vitamin (MULTIVITAMIN WITH MINERALS) TABS tablet Take 1 tablet by mouth daily.    [name] omeprazole (PRILOSEC) 40 MG capsule Take 1 capsule (40 mg  total) by mouth 2 (two) times daily. 60 capsule 11  . predniSONE (DELTASONE) 20 MG tablet Take 2 tablets (40 mg total) by mouth daily with breakfast. 10 tablet 0  . psyllium (METAMUCIL) 58.6 % powder Take 1 packet by mouth 2 (two) times daily. 1 TBS Twice Daily.    . rotigotine (NEUPRO) 4 MG/24HR place 1 patch ONTO THE SKIN once daily 30 patch 5  . Sildenafil Citrate (VIAGRA PO) Take by mouth. Reported on 11/12/2015    . sodium chloride (OCEAN) 0.65 % SOLN nasal spray Place 1 spray into both nostrils as needed for congestion. 15 mL 0  . betamethasone dipropionate (DIPROLENE) 0.05 % cream Apply topically 2 (two) times daily. 30 g 0  . doxycycline (VIBRA-TABS) 100 MG tablet Take 1 tablet (100 mg total) by mouth 2 (two) times daily. 20 tablet 0   No current facility-administered medications for this visit.     Allergies: Patient has no known allergies.  Past Medical History:  Diagnosis Date  . Allergic rhinitis   . Anemia, iron deficiency   . Arthritis   . Blood transfusion without reported diagnosis   . Colonic stricture ([place])   . ED (erectile dysfunction)   . Esophageal stricture    Status post upper endoscopy with esophageal dilatation  . Gastric polyps   . GERD (gastroesophageal reflux disease)   . Hiatal hernia   . Hypertension   . Hyponatremia   . Inguinal hernia   . Kidney stone    x 2  . Parkinson disease ([place])   . Ulcerative colitis    status post total  abdominal colectomy with ileoanal anastomosis    Past Surgical History:  Procedure Laterality Date  . COLECTOMY  1992   with ilial anal pull through (for ulcerative colitis)  . ETHMOIDECTOMY     and maxillary enterostomies '04, redo in 12/04  . INGUINAL HERNIA REPAIR    . KIDNEY STONE SURGERY     removed 2013  . KNEE ARTHROSCOPY Right 2004  . LASIK      Family History  Problem Relation Age of Onset  . Cancer Father        rectal cancer and MM  . Rectal cancer Father   . Dementia Mother   . Hypertension Mother    . Heart disease Brother        valve repaired at 90  . Colon cancer Neg Hx   . Esophageal cancer Neg Hx   . Stomach cancer Neg Hx     Social History   Tobacco Use  . Smoking status: Former Smoker    Last attempt to quit: 09/14/1979    Years since quitting: 38.6  . Smokeless tobacco: Never Used  . Tobacco comment: smoked for 7 years  Substance Use Topics  . Alcohol use: Yes    Alcohol/week: 0.0 oz    Comment: 2 glasses of wine per night    Subjective:  Patient presents with concerns for possible sinus infection; seems to have worsened since flying home yesterday; + chest congestion; symptoms x 4-5 days; bilateral ear pain; prone to recurrent sinus infections.   Objective:  Vitals:   04/12/18 1306  BP: 130/82  Pulse: 81  Temp: 97.7 F (36.5 C)  TempSrc: Oral  SpO2: 98%  Weight: 179 lb 0.6 oz (81.2 kg)  Height: 5' 6"  (1.676 m)    General: Well developed, well nourished, in no acute distress  Skin : Warm and dry.  Head: Normocephalic and atraumatic  Eyes: Sclera and conjunctiva clear; pupils round and reactive to light; extraocular movements intact  Ears: External normal; canals clear; tympanic membranes congested/ bilateral erythema Oropharynx: Pink, supple. No suspicious lesions  Neck: Supple without thyromegaly, adenopathy  Lungs: Respirations unlabored; clear to auscultation bilaterally without wheeze, rales, rhonchi  CVS exam: normal rate and regular rhythm.  Neurologic: Alert and oriented; speech intact; face symmetrical; moves all extremities well; CNII-XII intact without focal deficit   Assessment:  1. Acute sinusitis, recurrence not specified, unspecified location     Plan:  Rx for Doxycycline 100 mg bid x 10 days, Prednisone 40 mg qd x 5 days; increase fluids, rest and follow-up worse, no better.   No follow-ups on file.  No orders of the defined types were placed in this encounter.   Requested Prescriptions   Signed Prescriptions Disp Refills  .  betamethasone dipropionate (DIPROLENE) 0.05 % cream 30 g 0    Sig: Apply topically 2 (two) times daily.  . predniSONE (DELTASONE) 20 MG tablet 10 tablet 0    Sig: Take 2 tablets (40 mg total) by mouth daily with breakfast.  . doxycycline (VIBRA-TABS) 100 MG tablet 20 tablet 0    Sig: Take 1 tablet (100 mg total) by mouth 2 (two) times daily.

## 2018-04-17 DIAGNOSIS — N200 Calculus of kidney: Secondary | ICD-10-CM | POA: Diagnosis not present

## 2018-04-17 DIAGNOSIS — N486 Induration penis plastica: Secondary | ICD-10-CM | POA: Diagnosis not present

## 2018-04-20 DIAGNOSIS — M222X1 Patellofemoral disorders, right knee: Secondary | ICD-10-CM | POA: Diagnosis not present

## 2018-04-25 DIAGNOSIS — N200 Calculus of kidney: Secondary | ICD-10-CM | POA: Diagnosis not present

## 2018-06-26 NOTE — Progress Notes (Signed)
William [name] was seen today in the movement disorders clinic for neurologic consultation at the request of Plotnikov, Evie Lacks, MD.  The consultation is for the evaluation of tremor and gait instability.  Tremor is in both hands, but it is more in the R hand.  Pt states that he thought it was going on for 3 months but when he talked to his brother, he said he noted jaw tremor last summer.  He notes it at rest.  He also noted some shuffling.     02/26/16 update:  The patient follows up today regarding his new diagnosis of Parkinson's disease.  His wife accompanies him today and supplements the history.  He admits that there was an "emotional adjustment" to make over the diagnosis.  He states that the "jittery feeling" goes "up and down."  He is currently on no medications.  He has been exercising a "bunch".  Doing 30 min of cardio on home treadmill 4 days a week and does some weights 2 days a week.   Last visit, I talked to him about decreasing his alcohol content.  I checked lab work and his sodium was 128.  I asked him to follow-up with his primary care provider.  He did see his nurse practitioner and she felt that this was just chronic hyponatremia.  No falls but occasionally has "balance uncertainty", lightheadedness/near syncope, hallucinations since last visit.  Mood has been more irritable and wife states that she has noted a "real change."   Drinking 1-2 glasses of wine/night and sometimes will have "cocktail" as well.    06/28/16 update:  The patient follows up today, accompanied by his wife who supplements the history.  He is on no medication for this, as he has not wanted to be.  He has had no falls since last visit.  No lightheadedness or near syncope. He is doing cardio 5 days a week and feels "zapped" for the rest of the day.  Has had a little bit more tremor because of more stress.   In regards to alcohol, the patient states that he is drinking about the same, maybe a little less scotch.      07/30/16 update:  Pt f/u today.  He called his wife and she was in on the conference call.  I started him on Mirapex ER, and worked to 1.5 mg daily.  He called me and reported side effects with the medication.  He reported that he had a near passing out episode.  This occurred after drinking alcohol and his wife and friend had to help him out of the restaurant.  He called his pharmacist, who told him he should not be drinking alcohol with the medication.  We have discussed his alcohol intake several times in the past.  Ultimately, the patient ended up discontinuing his Mirapex ER.  States that it caused weight loss and diarrhea.  He does have a hx of UC and has a hx of diarrhea but not that often.  Pt concerned about low "BP."  Wife complains that he generally doesn't feel good.    09/14/16 update: The patient follows up today.  I have reviewed records since our last visit.  His losartan dose was initially dropped by half and about a week later, his primary care physician stopped the medication altogether.  The patient states that he is doing well off of the medication.  He has been keeping a log of it and brought it in for review  and the numbers looked good.  Otherwise, no changes since last visit besides for the fact that he had a URI.  States that he has had 3 rounds of antibiotics and still has productive cough in the AM.  Also c/o pain in the nose.  Saw ENT and was given ointment and not helpful.  Notes that has to purposefully swing arms to feel balanced.  No falls but some near falls.  R leg seems to "linger" when gets up and needs to turn - R leg isn't as fast to move.  Wife notes that he seems depressed.  Still drinking about same amount of EtOH.    10/14/16 update:  Patient follows up today, accompanied by his wife who supplements the history.  I spoke with his ENT physician prior to today's visit.  The patient is having lancinating pain in the nose and face.  He tried various creams and ointments  without relief and thought that perhaps this represented trigeminal neuralgia.  The patient states that the pain is located over the L nose and radiates around the L eye.  Pt states that it is a "jabbing pain."  It happens on and off all day long.  Chewing will make it worse as does looking down.  Doesn't know if cold will set it off.  Has had productive cough, but some better after abx x 3. No neuroimaging.   In regards to his Parkinson's, he was started on Neupro last visit and worked to 4 mg daily.  He thinks that medication worked well, without side effects.  Tremor improved. No compulsive behaviors.  No sleep attacks.  No hallucinations.  No lightheadedness or near syncope.  12/22/16 update:  Patient follows up today.  The patient remains on Neupro, 4 mg daily for Parkinson's disease. Having some skin irritation with the patch.   Patch is helping sx's.   No falls other that tripping over the dog.  No lightheadedness or near syncope.  No hallucinations.  No sleep attacks.  No compulsive behaviors.  His bigger issue has been trigeminal neuralgia.  We started him on Trileptal, which did seem to work, but caused hyponatremia.  We took him off of that and changed him to gabapentin, which did not help.  He is off of that now.  He got a surgical opinion from Dr. Cyndy Freeze on 11/22/2016.  Surgical decompression was offered given the MRI demonstrated AICA crossing near L trigeminal nerve root.  Pt wanted to think about that before proceeding and has not yet been back to neurosx.  Pain comes and goes.  Worse when in the shower and water hits it.    07/14/17 update:  Pt seen today in follow-up for his Parkinson's disease.  He is on Neupro, 4 mg daily.  He ran out for a day and he knew it was working.  He has no compulsive behaviors.  No sleep attacks.  No falls.  Worked out 6 days last week.  Reviewed records since our last visit.  He was in the hospital in September for chest pain.  Cardiology was consulted, but the  patient wanted to leave the hospital before they were actually able to see him.  He did see Dr. Claiborne Billings as an outpatient on October 4.  He felt chest pain was noncardiac in nature and perhaps related to anxiety or reflux.  He did just call here at the end of October and was concerned about memory and wanted neurocognitive testing.  We opted to discuss  this today.  States that his wife is having memory issues and he agreed to do some memory testing if she would.  He doesn't think that his memory is quite as good as in the past.   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  He changed new jobs a month ago.  He does think he is doing ok with this.  He did meet with Dr. Vertell Limber for [place] but he hasn't had any pain since but he states that he wanted to establish a relationship with him.    02/24/18 update: Patient is seen today in follow-up for Parkinson's disease.  I have not seen him since last November.  This patient is accompanied in the office by his spouse who supplements the history.  His daughter is also on the phone.   He is currently on Neupro, 4 mg daily.  No site reactions.  No compulsive behaviors.  No sleep attacks.  No hallucinations.  No falls but notes balance isn't as good.  He is noting more tremor.  He is noting start hesitation and "some short choppy steps."    He did have neurocognitive testing with Dr. Si Raider on October 10, 2017.  I have reviewed this.  There was no dementia.  There was evidence of mild cognitive impairment.  Patient has been to primary care and cardiology since last visit.  Those records are reviewed.  No follow-up was needed from a cardiology standpoint.  Chest pain had atypical features.  He is doing cardio exercises 3 days per week.    06/27/18 update: Patient is seen today in follow-up for Parkinson's disease.  This patient is accompanied in the office by his spouse who supplements the history.He is on Neupro, 4  mg daily.  Last visit, levodopa was added and he reports that it was really helpful at first but seems not quite as effective.  He noticed it really helped with shag dancing but maybe a little less so now.  He does that one time every few weeks.  Levodopa also helped start hesitation.  He is had one fall getting off a bar stool at home.   No lightheadedness or near syncope.  No hallucinations.  No compulsive behaviors or sleep attacks.  PREVIOUS MEDS:  mirapex (states that he had diarrhea but does have hx of UC and diarrhea isn't unusual for him; also had near passing out episode but had been drinking EtOH when it happened)   ALLERGIES:  No Known Allergies  CURRENT MEDICATIONS:  Outpatient Encounter Medications as of 06/27/2018  Medication Sig  . carbidopa-levodopa (SINEMET IR) 25-100 MG tablet Take 1 tablet by mouth 3 (three) times daily.  . ferrous sulfate 325 (65 FE) MG tablet Take 325 mg by mouth daily with breakfast.    . losartan (COZAAR) 50 MG tablet Take 1 tablet (50 mg total) by mouth daily.  [name] omeprazole (PRILOSEC) 40 MG capsule Take 1 capsule (40 mg total) by mouth 2 (two) times daily.  . psyllium (METAMUCIL) 58.6 % powder Take 1 packet by mouth 2 (two) times daily. 1 TBS Twice Daily.  . rotigotine (NEUPRO) 4 MG/24HR place 1 patch ONTO THE SKIN once daily  . Sildenafil Citrate (VIAGRA PO) Take by mouth. Reported on 11/12/2015  . sodium chloride (OCEAN) 0.65 % SOLN nasal spray Place 1 spray into both nostrils as needed for congestion.  . [DISCONTINUED] betamethasone dipropionate (DIPROLENE) 0.05 % cream  Apply topically 2 (two) times daily.  . [DISCONTINUED] doxycycline (VIBRA-TABS) 100 MG tablet Take 1 tablet (100 mg total) by mouth 2 (two) times daily.  . [DISCONTINUED] Multiple Vitamin (MULTIVITAMIN WITH MINERALS) TABS tablet Take 1 tablet by mouth daily.  . [DISCONTINUED] predniSONE (DELTASONE) 20 MG tablet Take 2 tablets (40 mg total) by mouth daily with breakfast.   No  facility-administered encounter medications on file as of 06/27/2018.     PAST MEDICAL HISTORY:   Past Medical History:  Diagnosis Date  . Allergic rhinitis   . Anemia, iron deficiency   . Arthritis   . Blood transfusion without reported diagnosis   . Colonic stricture ([place])   . ED (erectile dysfunction)   . Esophageal stricture    Status post upper endoscopy with esophageal dilatation  . Gastric polyps   . GERD (gastroesophageal reflux disease)   . Hiatal hernia   . Hypertension   . Hyponatremia   . Inguinal hernia   . Kidney stone    x 2  . Parkinson disease ([place])   . Ulcerative colitis    status post total abdominal colectomy with ileoanal anastomosis    PAST SURGICAL HISTORY:   Past Surgical History:  Procedure Laterality Date  . COLECTOMY  1992   with ilial anal pull through (for ulcerative colitis)  . ETHMOIDECTOMY     and maxillary enterostomies '04, redo in 12/04  . INGUINAL HERNIA REPAIR    . KIDNEY STONE SURGERY     removed 2013  . KNEE ARTHROSCOPY Right 2004  . LASIK      SOCIAL HISTORY:   Social History   Socioeconomic History  . Marital status: Married    Spouse name: Not on file  . Number of children: Not on file  . Years of education: Not on file  . Highest education level: Not on file  Occupational History  . Occupation: Public house manager: [employer]  Social Needs  . Financial resource strain: Not on file  . Food insecurity:    Worry: Not on file    Inability: Not on file  . Transportation needs:    Medical: Not on file    Non-medical: Not on file  Tobacco Use  . Smoking status: Former Smoker    Last attempt to quit: 09/14/1979    Years since quitting: 38.8  . Smokeless tobacco: Never Used  . Tobacco comment: smoked for 7 years  Substance and Sexual Activity  . Alcohol use: Yes    Alcohol/week: 0.0 standard drinks    Comment: 2 glasses of wine per night  . Drug use: No  . Sexual activity: Yes  Lifestyle  .  Physical activity:    Days per week: Not on file    Minutes per session: Not on file  . Stress: Not on file  Relationships  . Social connections:    Talks on phone: Not on file    Gets together: Not on file    Attends religious service: Not on file    Active member of club or organization: Not on file    Attends meetings of clubs or organizations: Not on file    Relationship status: Not on file  . Intimate partner violence:    Fear of current or ex partner: Not on file    Emotionally abused: Not on file    Physically abused: Not on file    Forced sexual activity: Not on file  Other Topics Concern  . Not  on file  Social History Narrative   [employer] grad   Married 13.5 years- divorced; married '05   No children   Work: Insurance underwriter   Patient is a former smoker. Quit in 1981   Alcohol use- yes social   Daily Caffeine use 2 cups coffee per day   Illicit drug use- no    FAMILY HISTORY:   Family Status  Relation Name Status  . Father  Deceased       rectal cancer, multiple myeloma  . Mother 73 Deceased       dementia, HTN  . Brother twin Alive       heart disease (identical)  . Brother  Alive       sinus issues  . Neg Hx  (Not Specified)    ROS:  Review of Systems  Constitutional: Negative.   HENT: Negative.   Eyes: Negative.   Respiratory: Negative.   Cardiovascular: Negative.   Gastrointestinal: Negative.   Genitourinary: Negative.   Musculoskeletal: Negative.   Skin: Negative.      PHYSICAL EXAMINATION:    VITALS:   Vitals:   06/27/18 0854  BP: (!) 148/80  Pulse: 62  SpO2: 97%  Weight: 176 lb (79.8 kg)  Height: 5' 6"  (1.676 m)   Wt Readings from Last 3 Encounters:  06/27/18 176 lb (79.8 kg)  04/12/18 179 lb 0.6 oz (81.2 kg)  02/24/18 173 lb (78.5 kg)   GEN:  The patient appears stated age and is in NAD. HEENT:  Normocephalic, atraumatic.  The mucous membranes are moist. The superficial temporal arteries are without ropiness or tenderness. CV:   RRR Lungs:  CTAB Neck/HEME:  There are no carotid bruits bilaterally.  Neurological examination:  Orientation: The patient is alert and oriented x3. Cranial nerves: There is good facial symmetry. The speech is fluent and clear. Soft palate rises symmetrically and there is no tongue deviation. Hearing is intact to conversational tone. Sensation: Sensation is intact to light touch throughout Motor: Strength is 5/5 in the bilateral upper and lower extremities.   Shoulder shrug is equal and symmetric.  There is no pronator drift.  Movement examination: Tone: There is normal tone in the UE/LE Abnormal movements: there is rare tremor in the RUE Coordination:  There is decremation with finger taps bilaterally and with toe taps bilaterally. Gait and Station: The patient has no difficulty arising out of a deep-seated chair without the use of the hands. The patient's stride length is very good but purposeful  LABS  Lab Results  Component Value Date   TSH 2.20 04/06/2017     Chemistry      Component Value Date/Time   NA 131 (L) 05/28/2017 0231   K 3.4 (L) 05/28/2017 0231   CL 101 05/28/2017 0231   CO2 24 05/28/2017 0231   BUN 15 05/28/2017 0231   CREATININE 1.05 05/28/2017 0231   CREATININE 0.94 08/02/2016 0906      Component Value Date/Time   CALCIUM 8.6 (L) 05/28/2017 0231   ALKPHOS 37 (L) 05/27/2017 1138   AST 16 05/27/2017 1138   ALT 19 05/27/2017 1138   BILITOT 0.7 05/27/2017 1138     Lab Results  Component Value Date   WBC 3.8 (L) 05/28/2017   HGB 12.3 (L) 05/28/2017   HCT 35.7 (L) 05/28/2017   MCV 93.5 05/28/2017   PLT 158 05/28/2017   Lab Results  Component Value Date   VITAMINB12 589 08/02/2016     ASSESSMENT/PLAN:  1. Idiopathic  Parkinson's disease.  The patient just barely meets [place] brain bank criteria but he does meet it.  -We discussed the diagnosis as well as pathophysiology of the disease again as wife not here last visit.  We discussed treatment options  as well as prognostic indicators.  Patient education was provided.  -Not convinced that all of the reported side effects were from the pramipexole ER but since we have other options decided to hold on reusing unless really need it  -continue neupro 4 mg daily.  Risks, benefits, side effects and alternative therapies were discussed.  The opportunity to ask questions was given and they were answered to the best of my ability.  The patient expressed understanding and willingness to follow the outlined treatment protocols.  -continue Carbidopa/levodopa 25/100, 1 tablet 3 times per day.  Can take extra if needed for shag dancing  -has been over a year since had labs.  Will do chemistry  -asks again about cbd/marijuana but no new data since he and I last talked. 2.  Trigeminal neuralgia, currently pain free  -Trileptal caused hyponatremia.     -Gabapentin has not been beneficial.  -He got a surgical opinion from Dr. Cyndy Freeze on 11/22/2016.  Surgical decompression was offered given the MRI demonstrated AICA crossing near L trigeminal nerve root.  Pt had second opinion with Dr. Vertell Limber but hasn't had pain since so just holding for now 3.  Memory change  -Neurocognitive testing done in January, 2019 demonstrated mild cognitive impairment.  No evidence of dementia.  -asks me today about OTC supplements for memory and didn't recommend them. 4.  Follow up is anticipated in the next 5 months, sooner should new neurologic issues arise.  Much greater than 50% of this visit was spent in counseling and coordinating care.  Total face to face time:  25 min

## 2018-06-27 ENCOUNTER — Encounter: Payer: Self-pay | Admitting: Neurology

## 2018-06-27 ENCOUNTER — Ambulatory Visit: Payer: Medicare HMO | Admitting: Neurology

## 2018-06-27 ENCOUNTER — Other Ambulatory Visit (INDEPENDENT_AMBULATORY_CARE_PROVIDER_SITE_OTHER): Payer: Medicare HMO

## 2018-06-27 VITALS — BP 148/80 | HR 62 | Ht 66.0 in | Wt 176.0 lb

## 2018-06-27 DIAGNOSIS — G20 Parkinson's disease: Secondary | ICD-10-CM

## 2018-06-27 DIAGNOSIS — Z5181 Encounter for therapeutic drug level monitoring: Secondary | ICD-10-CM | POA: Diagnosis not present

## 2018-06-27 NOTE — Patient Instructions (Signed)
1. Your provider has requested that you have labwork completed today. Please go to [facility] ([id number]) on the second floor of this building before leaving the office today. You do not need to check in. If you are not called within 15 minutes please check with the front desk.

## 2018-06-28 ENCOUNTER — Telehealth: Payer: Self-pay | Admitting: Neurology

## 2018-06-28 LAB — COMPREHENSIVE METABOLIC PANEL
AG RATIO: 1.9 (calc) (ref 1.0–2.5)
ALBUMIN MSPROF: 4.3 g/dL (ref 3.6–5.1)
ALT: 18 U/L (ref 9–46)
AST: 17 U/L (ref 10–35)
Alkaline phosphatase (APISO): 30 U/L — ABNORMAL LOW (ref 40–115)
BUN: 21 mg/dL (ref 7–25)
CO2: 23 mmol/L (ref 20–32)
Calcium: 9.4 mg/dL (ref 8.6–10.3)
Chloride: 102 mmol/L (ref 98–110)
Creat: 1.07 mg/dL (ref 0.70–1.25)
GLOBULIN: 2.3 g/dL (ref 1.9–3.7)
GLUCOSE: 97 mg/dL (ref 65–99)
POTASSIUM: 4.4 mmol/L (ref 3.5–5.3)
SODIUM: 133 mmol/L — AB (ref 135–146)
TOTAL PROTEIN: 6.6 g/dL (ref 6.1–8.1)
Total Bilirubin: 0.5 mg/dL (ref 0.2–1.2)

## 2018-06-28 NOTE — Telephone Encounter (Signed)
-----   Message from [place], DO sent at 06/28/2018 10:52 AM EDT ----- Let pt know that chem/labs okay

## 2018-06-28 NOTE — Telephone Encounter (Signed)
Mychart message sent to patient.

## 2018-06-29 ENCOUNTER — Telehealth: Payer: Self-pay | Admitting: Internal Medicine

## 2018-06-29 MED ORDER — LOSARTAN POTASSIUM 50 MG PO TABS: 50.0000 mg | ORAL_TABLET | Freq: Every day | ORAL | 0 refills | Status: DC

## 2018-06-29 NOTE — Telephone Encounter (Signed)
30 day supply sent, Patient needs office visit before refills will be given

## 2018-06-29 NOTE — Telephone Encounter (Signed)
Copied from [place] [phone]. Topic: Quick Communication - Rx Refill/Question >> Jun 29, 2018  2:46 PM Judyann Munson wrote: Medication: losartan (COZAAR) 50 MG tablet  Has the patient contacted their pharmacy?no   Preferred Pharmacy (with phone number or street name): [facility] [phone] - [place], [place] - [address] [phone] (Phone) [phone] (Fax)    Agent: Please be advised that RX refills may take up to 3 business days. We ask that you follow-up with your pharmacy.

## 2018-06-30 ENCOUNTER — Ambulatory Visit: Payer: Medicare HMO | Admitting: Neurology

## 2018-06-30 DIAGNOSIS — M79676 Pain in unspecified toe(s): Secondary | ICD-10-CM

## 2018-07-07 ENCOUNTER — Ambulatory Visit: Payer: Medicare HMO | Admitting: Neurology

## 2018-07-27 ENCOUNTER — Other Ambulatory Visit: Payer: Self-pay | Admitting: Internal Medicine

## 2018-07-28 DIAGNOSIS — N486 Induration penis plastica: Secondary | ICD-10-CM | POA: Diagnosis not present

## 2018-07-28 DIAGNOSIS — N200 Calculus of kidney: Secondary | ICD-10-CM | POA: Diagnosis not present

## 2018-08-03 DIAGNOSIS — M25561 Pain in right knee: Secondary | ICD-10-CM | POA: Diagnosis not present

## 2018-08-23 DIAGNOSIS — M25561 Pain in right knee: Secondary | ICD-10-CM | POA: Diagnosis not present

## 2018-08-28 ENCOUNTER — Encounter: Payer: Self-pay | Admitting: Internal Medicine

## 2018-08-28 ENCOUNTER — Ambulatory Visit (INDEPENDENT_AMBULATORY_CARE_PROVIDER_SITE_OTHER): Payer: Medicare HMO | Admitting: Internal Medicine

## 2018-08-28 DIAGNOSIS — J019 Acute sinusitis, unspecified: Secondary | ICD-10-CM | POA: Diagnosis not present

## 2018-08-28 MED ORDER — CEFDINIR 300 MG PO CAPS: 300.0000 mg | ORAL_CAPSULE | Freq: Two times a day (BID) | ORAL | 0 refills | Status: DC

## 2018-08-28 NOTE — Patient Instructions (Signed)
You can use over-the-counter  "cold" medicines  such as "Afrin" nasal spray for nasal congestion as directed. Use " Delsym" or" Robitussin" cough syrup varietis for cough.  You can use plain "Tylenol" or "Advil" for fever, chills and achyness. Use Halls or Ricola cough drops.    Please, make an appointment if you are not better or if you're worse.

## 2018-08-28 NOTE — Assessment & Plan Note (Signed)
Cefdinir x10 d

## 2018-08-28 NOTE — Progress Notes (Signed)
Subjective:  Patient ID: William [name], male    DOB: 10-16-1950  Age: 67 y.o. MRN: [id number]  CC: No chief complaint on file.   HPI William [name] presents for URI sx's x 3 d - worse C/o cough - green, green sinus d/c  Outpatient Medications Prior to Visit  Medication Sig Dispense Refill  . carbidopa-levodopa (SINEMET IR) 25-100 MG tablet Take 1 tablet by mouth 3 (three) times daily. 270 tablet 1  . ferrous sulfate 325 (65 FE) MG tablet Take 325 mg by mouth daily with breakfast.      . losartan (COZAAR) 50 MG tablet Take 1 tablet (50 mg total) by mouth daily. Patient needs office visit before refills will be given 30 tablet 0  . omeprazole (PRILOSEC) 40 MG capsule TAKE ONE CAPSULE BY MOUTH TWICE DAILY 180 capsule 3  . psyllium (METAMUCIL) 58.6 % powder Take 1 packet by mouth 2 (two) times daily. 1 TBS Twice Daily.    . rotigotine (NEUPRO) 4 MG/24HR place 1 patch ONTO THE SKIN once daily 30 patch 5  . Sildenafil Citrate (VIAGRA PO) Take by mouth. Reported on 11/12/2015    . sodium chloride (OCEAN) 0.65 % SOLN nasal spray Place 1 spray into both nostrils as needed for congestion. 15 mL 0   No facility-administered medications prior to visit.     ROS: Review of Systems  Constitutional: Positive for chills, fatigue and fever. Negative for appetite change and unexpected weight change.  HENT: Positive for rhinorrhea, sinus pressure and sore throat. Negative for congestion, nosebleeds, sneezing and trouble swallowing.   Eyes: Negative for itching and visual disturbance.  Respiratory: Positive for cough and shortness of breath. Negative for chest tightness and wheezing.   Cardiovascular: Negative for chest pain, palpitations and leg swelling.  Gastrointestinal: Negative for abdominal distention, blood in stool, diarrhea and nausea.  Genitourinary: Negative for frequency and hematuria.  Musculoskeletal: Negative for back pain, gait problem, joint swelling and neck pain.  Skin: Negative  for rash.  Neurological: Negative for dizziness, tremors, speech difficulty and weakness.  Psychiatric/Behavioral: Negative for agitation, dysphoric mood, sleep disturbance and suicidal ideas. The patient is not nervous/anxious.     Objective:  BP 134/82 (BP Location: Left Arm, Patient Position: Sitting, Cuff Size: Normal)   Pulse 65   Temp 98.6 F (37 C) (Oral)   Ht 5' 6"  (1.676 m)   Wt 174 lb (78.9 kg)   SpO2 96%   BMI 28.08 kg/m   BP Readings from Last 3 Encounters:  08/28/18 134/82  06/27/18 (!) 148/80  04/12/18 130/82    Wt Readings from Last 3 Encounters:  08/28/18 174 lb (78.9 kg)  06/27/18 176 lb (79.8 kg)  04/12/18 179 lb 0.6 oz (81.2 kg)    Physical Exam Constitutional:      General: He is not in acute distress.    Appearance: He is well-developed.     Comments: NAD  Eyes:     Conjunctiva/sclera: Conjunctivae normal.     Pupils: Pupils are equal, round, and reactive to light.  Neck:     Musculoskeletal: Normal range of motion.     Thyroid: No thyromegaly.     Vascular: No JVD.  Cardiovascular:     Rate and Rhythm: Normal rate and regular rhythm.     Heart sounds: Normal heart sounds. No murmur. No friction rub. No gallop.   Pulmonary:     Effort: Pulmonary effort is normal. No respiratory distress.     Breath  sounds: Normal breath sounds. No wheezing or rales.  Chest:     Chest wall: No tenderness.  Abdominal:     General: Bowel sounds are normal. There is no distension.     Palpations: Abdomen is soft. There is no mass.     Tenderness: There is no abdominal tenderness. There is no guarding or rebound.  Musculoskeletal: Normal range of motion.        General: No tenderness.  Lymphadenopathy:     Cervical: No cervical adenopathy.  Skin:    General: Skin is warm and dry.     Findings: No rash.  Neurological:     Mental Status: He is alert and oriented to person, place, and time.     Cranial Nerves: No cranial nerve deficit.     Motor: No abnormal  muscle tone.     Coordination: Coordination normal.     Gait: Gait normal.     Deep Tendon Reflexes: Reflexes are normal and symmetric.  Psychiatric:        Behavior: Behavior normal.        Thought Content: Thought content normal.        Judgment: Judgment normal.     Lab Results  Component Value Date   WBC 3.8 (L) 05/28/2017   HGB 12.3 (L) 05/28/2017   HCT 35.7 (L) 05/28/2017   PLT 158 05/28/2017   GLUCOSE 97 06/27/2018   CHOL 157 04/06/2017   TRIG 55.0 04/06/2017   HDL 69.70 04/06/2017   LDLCALC 76 04/06/2017   ALT 18 06/27/2018   AST 17 06/27/2018   NA 133 (L) 06/27/2018   K 4.4 06/27/2018   CL 102 06/27/2018   CREATININE 1.07 06/27/2018   BUN 21 06/27/2018   CO2 23 06/27/2018   TSH 2.20 04/06/2017   PSA 0.98 04/06/2017    Dg Chest 2 View  Result Date: 05/27/2017 CLINICAL DATA:  Chest pressure radiating into right shoulder. EXAM: CHEST  2 VIEW COMPARISON:  05/27/2017 FINDINGS: The heart size and mediastinal contours are within normal limits. Both lungs are clear. The visualized skeletal structures are unremarkable. IMPRESSION: No active cardiopulmonary disease. Electronically Signed   By: Kerby Moors M.D.   On: 05/27/2017 18:47   Dg Chest 2 View  Result Date: 05/27/2017 CLINICAL DATA:  Left chest pain EXAM: CHEST  2 VIEW COMPARISON:  10/23/2009 FINDINGS: The heart size and mediastinal contours are within normal limits. Both lungs are clear. The visualized skeletal structures are unremarkable. IMPRESSION: No active cardiopulmonary disease. Electronically Signed   By: Jerilynn Mages.  Shick M.D.   On: 05/27/2017 11:53    Assessment & Plan:   There are no diagnoses linked to this encounter.   No orders of the defined types were placed in this encounter.    Follow-up: No follow-ups on file.  Walker Kehr, MD

## 2018-09-01 DIAGNOSIS — M25561 Pain in right knee: Secondary | ICD-10-CM | POA: Diagnosis not present

## 2018-09-01 DIAGNOSIS — M1711 Unilateral primary osteoarthritis, right knee: Secondary | ICD-10-CM | POA: Diagnosis not present

## 2018-09-05 ENCOUNTER — Other Ambulatory Visit: Payer: Self-pay | Admitting: Internal Medicine

## 2018-09-05 MED ORDER — ROTIGOTINE 4 MG/24HR TD PT24: MEDICATED_PATCH | TRANSDERMAL | 0 refills | Status: DC

## 2018-09-20 ENCOUNTER — Other Ambulatory Visit: Payer: Self-pay

## 2018-09-20 ENCOUNTER — Ambulatory Visit (INDEPENDENT_AMBULATORY_CARE_PROVIDER_SITE_OTHER): Payer: Medicare HMO | Admitting: Family Medicine

## 2018-09-20 ENCOUNTER — Encounter: Payer: Self-pay | Admitting: Family Medicine

## 2018-09-20 VITALS — BP 102/68 | HR 78 | Temp 98.1°F | Ht 66.0 in | Wt 174.2 lb

## 2018-09-20 DIAGNOSIS — Z23 Encounter for immunization: Secondary | ICD-10-CM

## 2018-09-20 DIAGNOSIS — R197 Diarrhea, unspecified: Secondary | ICD-10-CM

## 2018-09-20 NOTE — Progress Notes (Signed)
Subjective:     Patient ID: William [name], male   DOB: 11-10-1950, 68 y.o.   MRN: [id number]  HPI Patient is seen with 2-day history of some diarrhea.  He has past history of ulcerative colitis and has had previous partial colectomy and states that at baseline he has about 4-5 somewhat loose stools per day.  Normally takes Metamucil.  He is currently having about 6 or more watery stools per day.  No bloody stools.  No known fever.  Mild body aches.  Denies any nausea or vomiting.  No recent travels.  He was treated with cefdinir but finished this over 2 weeks ago and did not have any diarrhea for 2 weeks after taking that.  No abdominal pain.  Keeping down fluids well.  Appetite fair.  Past Medical History:  Diagnosis Date  . Allergic rhinitis   . Anemia, iron deficiency   . Arthritis   . Blood transfusion without reported diagnosis   . Colonic stricture ([place])   . ED (erectile dysfunction)   . Esophageal stricture    Status post upper endoscopy with esophageal dilatation  . Gastric polyps   . GERD (gastroesophageal reflux disease)   . Hiatal hernia   . Hypertension   . Hyponatremia   . Inguinal hernia   . Kidney stone    x 2  . Parkinson disease ([place])   . Ulcerative colitis    status post total abdominal colectomy with ileoanal anastomosis   Past Surgical History:  Procedure Laterality Date  . COLECTOMY  1992   with ilial anal pull through (for ulcerative colitis)  . ETHMOIDECTOMY     and maxillary enterostomies '04, redo in 12/04  . INGUINAL HERNIA REPAIR    . KIDNEY STONE SURGERY     removed 2013  . KNEE ARTHROSCOPY Right 2004  . LASIK      reports that he quit smoking about 39 years ago. He has never used smokeless tobacco. He reports current alcohol use. He reports that he does not use drugs. family history includes Cancer in his father; Dementia in his mother; Heart disease in his brother; Hypertension in his mother; Rectal cancer in his father. No Known  Allergies   Review of Systems  Constitutional: Negative for fever.  Gastrointestinal: Positive for diarrhea. Negative for abdominal pain, blood in stool, nausea and vomiting.  Neurological: Negative for dizziness.       Objective:   Physical Exam Constitutional:      Appearance: Normal appearance.  Cardiovascular:     Rate and Rhythm: Normal rate and regular rhythm.  Pulmonary:     Effort: Pulmonary effort is normal.     Breath sounds: Normal breath sounds.  Abdominal:     General: Abdomen is flat. Bowel sounds are normal.     Palpations: Abdomen is soft.     Tenderness: There is no abdominal tenderness. There is no guarding or rebound.  Neurological:     Mental Status: He is alert.        Assessment:     Patient is seen with 2-day history of diarrhea.  Suspect viral.  Clinically stable.  He has frequent loose stools at baseline with prior history of partial colectomy related to ulcerative colitis.  He did have recent antibiotics but doubt C. difficile.  He has not had any fever, bloody stools, etc.  Also finished antibiotic over 2 weeks ago    Plan:     -Discussed appropriate diet for diarrhea -Push fluids -Follow-up  for any fever, bloody stools, abdominal pain, or if diarrhea not resolving over the next week -consider check stool for C dif if not better in one week.  Eulas Post MD [facility]

## 2018-09-20 NOTE — Patient Instructions (Signed)
Food Choices to Help Relieve Diarrhea, Adult When you have diarrhea, the foods you eat and your eating habits are very important. Choosing the right foods and drinks can help:  Relieve diarrhea.  Replace lost fluids and nutrients.  Prevent dehydration. What general guidelines should I follow?  Relieving diarrhea  Choose foods with less than 2 g or .07 oz. of fiber per serving.  Limit fats to less than 8 tsp (38 g or 1.34 oz.) a day.  Avoid the following: ? Foods and beverages sweetened with high-fructose corn syrup, honey, or sugar alcohols such as xylitol, sorbitol, and mannitol. ? Foods that contain a lot of fat or sugar. ? Fried, greasy, or spicy foods. ? High-fiber grains, breads, and cereals. ? Raw fruits and vegetables.  Eat foods that are rich in probiotics. These foods include dairy products such as yogurt and fermented milk products. They help increase healthy bacteria in the stomach and intestines (gastrointestinal tract, or GI tract).  If you have lactose intolerance, avoid dairy products. These may make your diarrhea worse.  Take medicine to help stop diarrhea (antidiarrheal medicine) only as told by your health care provider. Replacing nutrients  Eat small meals or snacks every 3-4 hours.  Eat bland foods, such as white rice, toast, or baked potato, until your diarrhea starts to get better. Gradually reintroduce nutrient-rich foods as tolerated or as told by your health care provider. This includes: ? Well-cooked protein foods. ? Peeled, seeded, and soft-cooked fruits and vegetables. ? Low-fat dairy products.  Take vitamin and mineral supplements as told by your health care provider. Preventing dehydration  Start by sipping water or a special solution to prevent dehydration (oral rehydration solution, ORS). Urine that is clear or pale yellow means that you are getting enough fluid.  Try to drink at least 8-10 cups of fluid each day to help replace lost  fluids.  You may add other liquids in addition to water, such as clear juice or decaffeinated sports drinks, as tolerated or as told by your health care provider.  Avoid drinks with caffeine, such as coffee, tea, or soft drinks.  Avoid alcohol. What foods are recommended?     The items listed may not be a complete list. Talk with your health care provider about what dietary choices are best for you. Grains White rice. White, [place], or pita breads (fresh or toasted), including plain rolls, buns, or bagels. White pasta. Saltine, soda, or graham crackers. Pretzels. Low-fiber cereal. Cooked cereals made with water (such as cornmeal, farina, or cream cereals). Plain muffins. Matzo. Melba toast. Zwieback. Vegetables Potatoes (without the skin). Most well-cooked and canned vegetables without skins or seeds. Tender lettuce. Fruits Apple sauce. Fruits canned in juice. Cooked apricots, cherries, grapefruit, peaches, pears, or plums. Fresh bananas and cantaloupe. Meats and other protein foods Baked or boiled chicken. Eggs. Tofu. Fish. Seafood. Smooth nut butters. Ground or well-cooked tender beef, ham, veal, lamb, pork, or poultry. Dairy Plain yogurt, kefir, and unsweetened liquid yogurt. Lactose-free milk, buttermilk, skim milk, or soy milk. Low-fat or nonfat hard cheese. Beverages Water. Low-calorie sports drinks. Fruit juices without pulp. Strained tomato and vegetable juices. Decaffeinated teas. Sugar-free beverages not sweetened with sugar alcohols. Oral rehydration solutions, if approved by your health care provider. Seasoning and other foods Bouillon, broth, or soups made from recommended foods. What foods are not recommended? The items listed may not be a complete list. Talk with your health care provider about what dietary choices are best for you. Grains Whole  grain, whole wheat, bran, or rye breads, rolls, pastas, and crackers. Wild or brown rice. Whole grain or bran cereals. Barley.  Oats and oatmeal. Corn tortillas or taco shells. Granola. Popcorn. Vegetables Raw vegetables. Fried vegetables. Cabbage, broccoli, Brussels sprouts, artichokes, baked beans, beet greens, corn, kale, legumes, peas, sweet potatoes, and yams. Potato skins. Cooked spinach and cabbage. Fruits Dried fruit, including raisins and dates. Raw fruits. Stewed or dried prunes. Canned fruits with syrup. Meat and other protein foods Fried or fatty meats. Deli meats. Chunky nut butters. Nuts and seeds. Beans and lentils. Berniece Salines. Hot dogs. Sausage. Dairy High-fat cheeses. Whole milk, chocolate milk, and beverages made with milk, such as milk shakes. Half-and-half. Cream. sour cream. Ice cream. Beverages Caffeinated beverages (such as coffee, tea, soda, or energy drinks). Alcoholic beverages. Fruit juices with pulp. Prune juice. Soft drinks sweetened with high-fructose corn syrup or sugar alcohols. High-calorie sports drinks. Fats and oils Butter. Cream sauces. Margarine. Salad oils. Plain salad dressings. Olives. Avocados. Mayonnaise. Sweets and desserts Sweet rolls, doughnuts, and sweet breads. Sugar-free desserts sweetened with sugar alcohols such as xylitol and sorbitol. Seasoning and other foods Honey. Hot sauce. Chili powder. Gravy. Cream-based or milk-based soups. Pancakes and waffles. Summary  When you have diarrhea, the foods you eat and your eating habits are very important.  Make sure you get at least 8-10 cups of fluid each day, or enough to keep your urine clear or pale yellow.  Eat bland foods and gradually reintroduce healthy, nutrient-rich foods as tolerated, or as told by your health care provider.  Avoid high-fiber, fried, greasy, or spicy foods. This information is not intended to replace advice given to you by your health care provider. Make sure you discuss any questions you have with your health care provider. Document Released: 11/20/2003 Document Revised: 08/27/2016 Document Reviewed:  08/27/2016 Elsevier Interactive Patient Education  2019 [employer].  Follow up for any fever, bloody stools.abdominal pain, or if diarrhea not resolving over the next week.

## 2018-09-22 ENCOUNTER — Ambulatory Visit: Payer: Self-pay | Admitting: *Deleted

## 2018-09-22 NOTE — Telephone Encounter (Signed)
Summary: advice   Pt seen 1/08 for stomach pain, diarrhea, pt still has body aches, fever, (100.5) chills, diarrhea, cramping. Pt was not given any med. Pt would like to know what to do     Patient has continued to have diarrhea- not as often. Patient does report he is felt worse in respect to body aches, fever and chills last night.(100.5) Patient has no nausea and appetite is good.  Temperature today- 96.3 first check and second check is 97.5 (30 minutes later) Offered patient appointment for follow up or option to wait and see if his symptoms get worse- he can call back- he will see how he feels and call back if needed.Stressed importance of calling back if he feels worse- he understands. Call to office to make them aware.   Reason for Disposition . [1] Fever AND [2] no signs of serious infection or localizing symptoms (all other triage questions negative)  Answer Assessment - Initial Assessment Questions 1. TEMPERATURE: "What is the most recent temperature?"  "How was it measured?"      96.3 this morning 2. ONSET: "When did the fever start?"      Last night- 100.5 3. SYMPTOMS: "Do you have any other symptoms besides the fever?"  (e.g., colds, headache, sore throat, earache, cough, rash, diarrhea, vomiting, abdominal pain)     Diarrhea- symptoms are better, body ache 4. CAUSE: If there are no symptoms, ask: "What do you think is causing the fever?"      Viral symptom 5. CONTACTS: "Does anyone else in the family have an infection?"     Not acked 6. TREATMENT: "What have you done so far to treat this fever?" (e.g., medications)     fluids and diet- not asked about - medications- no temperature this morning 7. IMMUNOCOMPROMISE: "Do you have of the following: diabetes, HIV positive, splenectomy, cancer chemotherapy, chronic steroid treatment, transplant patient, etc."     no 8. PREGNANCY: "Is there any chance you are pregnant?" "When was your last menstrual period?"     n/a  Protocols  used: FEVER-A-AH

## 2018-09-26 ENCOUNTER — Ambulatory Visit: Payer: Medicare HMO | Admitting: Internal Medicine

## 2018-09-28 ENCOUNTER — Telehealth: Payer: Self-pay | Admitting: Neurology

## 2018-09-28 ENCOUNTER — Other Ambulatory Visit: Payer: Self-pay | Admitting: Internal Medicine

## 2018-09-28 NOTE — Telephone Encounter (Signed)
Spoke with patient and made him aware with PD, symptoms do get worse with illness and she doesn't adjust medications based on short term changes. I advised him to monitor symptoms, stay hydrated, and if he isn't feeling better in a couple weeks to call back to re-address. He expressed understanding.

## 2018-09-28 NOTE — Telephone Encounter (Signed)
Patient called regarding him having a Stomach bug last week and made him very weak. He feels that it has made his Parkinson's "go into high gear". Please Call. Thanks

## 2018-10-02 DIAGNOSIS — R69 Illness, unspecified: Secondary | ICD-10-CM | POA: Diagnosis not present

## 2018-10-04 ENCOUNTER — Other Ambulatory Visit: Payer: Self-pay | Admitting: Internal Medicine

## 2018-10-05 ENCOUNTER — Other Ambulatory Visit: Payer: Self-pay | Admitting: Internal Medicine

## 2018-10-06 ENCOUNTER — Telehealth: Payer: Self-pay | Admitting: Neurology

## 2018-10-06 MED ORDER — ROTIGOTINE 4 MG/24HR TD PT24: 1.0000 | MEDICATED_PATCH | Freq: Every day | TRANSDERMAL | 2 refills | Status: DC

## 2018-10-06 NOTE — Telephone Encounter (Signed)
Patient calling in about Neupro package medication to the [facility] on [address]. He said he has an appt in march so please fill until then. Please send. Thanks!

## 2018-10-06 NOTE — Telephone Encounter (Signed)
RX sent to pharmacy  

## 2018-10-11 ENCOUNTER — Telehealth: Payer: Self-pay | Admitting: Neurology

## 2018-10-11 NOTE — Telephone Encounter (Signed)
Patient states on his VM that he left he was almost out of his patches and we needed to call in a new refill. He states that he left a message earlier but has not heard anything from our office

## 2018-10-11 NOTE — Telephone Encounter (Signed)
Yes, as documented in the chart on 10/06/2018 he requested Neupro patches and this was sent to the pharmacy.   Left message on machine for patient making him aware.

## 2018-10-19 DIAGNOSIS — M1711 Unilateral primary osteoarthritis, right knee: Secondary | ICD-10-CM | POA: Diagnosis not present

## 2018-10-19 DIAGNOSIS — M25561 Pain in right knee: Secondary | ICD-10-CM | POA: Diagnosis not present

## 2018-10-28 DIAGNOSIS — M1711 Unilateral primary osteoarthritis, right knee: Secondary | ICD-10-CM | POA: Diagnosis not present

## 2018-11-02 ENCOUNTER — Other Ambulatory Visit: Payer: Self-pay | Admitting: Neurology

## 2018-11-02 DIAGNOSIS — M1711 Unilateral primary osteoarthritis, right knee: Secondary | ICD-10-CM | POA: Diagnosis not present

## 2018-11-02 DIAGNOSIS — M25561 Pain in right knee: Secondary | ICD-10-CM | POA: Diagnosis not present

## 2018-11-24 NOTE — Progress Notes (Signed)
William [name] was seen today in the movement disorders clinic for neurologic consultation at the request of Plotnikov, Evie Lacks, MD.  The consultation is for the evaluation of tremor and gait instability.  Tremor is in both hands, but it is more in the R hand.  Pt states that he thought it was going on for 3 months but when he talked to his brother, he said he noted jaw tremor last summer.  He notes it at rest.  He also noted some shuffling.     02/26/16 update:  The patient follows up today regarding his new diagnosis of Parkinsons disease.  His wife accompanies him today and supplements the history.  He admits that there was an "emotional adjustment" to make over the diagnosis.  He states that the "jittery feeling" goes "up and down."  He is currently on no medications.  He has been exercising a "bunch".  Doing 30 min of cardio on home treadmill 4 days a week and does some weights 2 days a week.   Last visit, I talked to him about decreasing his alcohol content.  I checked lab work and his sodium was 128.  I asked him to follow-up with his primary care provider.  He did see his nurse practitioner and she felt that this was just chronic hyponatremia.  No falls but occasionally has "balance uncertainty", lightheadedness/near syncope, hallucinations since last visit.  Mood has been more irritable and wife states that she has noted a "real change."   Drinking 1-2 glasses of wine/night and sometimes will have "cocktail" as well.    06/28/16 update:  The patient follows up today, accompanied by his wife who supplements the history.  He is on no medication for this, as he has not wanted to be.  He has had no falls since last visit.  No lightheadedness or near syncope. He is doing cardio 5 days a week and feels "zapped" for the rest of the day.  Has had a little bit more tremor because of more stress.   In regards to alcohol, the patient states that he is drinking about the same, maybe a little less scotch.      07/30/16 update:  Pt f/u today.  He called his wife and she was in on the conference call.  I started him on Mirapex ER, and worked to 1.5 mg daily.  He called me and reported side effects with the medication.  He reported that he had a near passing out episode.  This occurred after drinking alcohol and his wife and friend had to help him out of the restaurant.  He called his pharmacist, who told him he should not be drinking alcohol with the medication.  We have discussed his alcohol intake several times in the past.  Ultimately, the patient ended up discontinuing his Mirapex ER.  States that it caused weight loss and diarrhea.  He does have a hx of UC and has a hx of diarrhea but not that often.  Pt concerned about low "BP."  Wife complains that he generally doesn't feel good.    09/14/16 update: The patient follows up today.  I have reviewed records since our last visit.  His losartan dose was initially dropped by half and about a week later, his primary care physician stopped the medication altogether.  The patient states that he is doing well off of the medication.  He has been keeping a log of it and brought it in for review  and the numbers looked good.  Otherwise, no changes since last visit besides for the fact that he had a URI.  States that he has had 3 rounds of antibiotics and still has productive cough in the AM.  Also c/o pain in the nose.  Saw ENT and was given ointment and not helpful.  Notes that has to purposefully swing arms to feel balanced.  No falls but some near falls.  R leg seems to "linger" when gets up and needs to turn - R leg isn't as fast to move.  Wife notes that he seems depressed.  Still drinking about same amount of EtOH.    10/14/16 update:  Patient follows up today, accompanied by his wife who supplements the history.  I spoke with his ENT physician prior to today's visit.  The patient is having lancinating pain in the nose and face.  He tried various creams and ointments  without relief and thought that perhaps this represented trigeminal neuralgia.  The patient states that the pain is located over the L nose and radiates around the L eye.  Pt states that it is a "jabbing pain."  It happens on and off all day long.  Chewing will make it worse as does looking down.  Doesn't know if cold will set it off.  Has had productive cough, but some better after abx x 3. No neuroimaging.   In regards to his Parkinson's, he was started on Neupro last visit and worked to 4 mg daily.  He thinks that medication worked well, without side effects.  Tremor improved. No compulsive behaviors.  No sleep attacks.  No hallucinations.  No lightheadedness or near syncope.  12/22/16 update:  Patient follows up today.  The patient remains on Neupro, 4 mg daily for Parkinson's disease. Having some skin irritation with the patch.   Patch is helping sx's.   No falls other that tripping over the dog.  No lightheadedness or near syncope.  No hallucinations.  No sleep attacks.  No compulsive behaviors.  His bigger issue has been trigeminal neuralgia.  We started him on Trileptal, which did seem to work, but caused hyponatremia.  We took him off of that and changed him to gabapentin, which did not help.  He is off of that now.  He got a surgical opinion from Dr. Cyndy Freeze on 11/22/2016.  Surgical decompression was offered given the MRI demonstrated AICA crossing near L trigeminal nerve root.  Pt wanted to think about that before proceeding and has not yet been back to neurosx.  Pain comes and goes.  Worse when in the shower and water hits it.    07/14/17 update:  Pt seen today in follow-up for his Parkinson's disease.  He is on Neupro, 4 mg daily.  He ran out for a day and he knew it was working.  He has no compulsive behaviors.  No sleep attacks.  No falls.  Worked out 6 days last week.  Reviewed records since our last visit.  He was in the hospital in September for chest pain.  Cardiology was consulted, but the  patient wanted to leave the hospital before they were actually able to see him.  He did see Dr. Claiborne Billings as an outpatient on October 4.  He felt chest pain was noncardiac in nature and perhaps related to anxiety or reflux.  He did just call here at the end of October and was concerned about memory and wanted neurocognitive testing.  We opted to discuss  this today.  States that his wife is having memory issues and he agreed to do some memory testing if she would.  He doesn't think that his memory is quite as good as in the past.   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  He changed new jobs a month ago.  He does think he is doing ok with this.  He did meet with Dr. Vertell Limber for [place] but he hasn't had any pain since but he states that he wanted to establish a relationship with him.    02/24/18 update: Patient is seen today in follow-up for Parkinson's disease.  I have not seen him since last November.  This patient is accompanied in the office by his spouse who supplements the history.  His daughter is also on the phone.   He is currently on Neupro, 4 mg daily.  No site reactions.  No compulsive behaviors.  No sleep attacks.  No hallucinations.  No falls but notes balance isn't as good.  He is noting more tremor.  He is noting start hesitation and "some short choppy steps."    He did have neurocognitive testing with Dr. Si Raider on October 10, 2017.  I have reviewed this.  There was no dementia.  There was evidence of mild cognitive impairment.  Patient has been to primary care and cardiology since last visit.  Those records are reviewed.  No follow-up was needed from a cardiology standpoint.  Chest pain had atypical features.  He is doing cardio exercises 3 days per week.    06/27/18 update: Patient is seen today in follow-up for Parkinson's disease.  This patient is accompanied in the office by his spouse who supplements the history.He is on Neupro, 4  mg daily.  Last visit, levodopa was added and he reports that it was really helpful at first but seems not quite as effective.  He noticed it really helped with shag dancing but maybe a little less so now.  He does that one time every few weeks.  Levodopa also helped start hesitation.  He is had one fall getting off a bar stool at home.   No lightheadedness or near syncope.  No hallucinations.  No compulsive behaviors or sleep attacks.  11/27/2018 update: Patient is seen today in follow-up for Parkinson's disease, accompanied by his wife who supplements the history.  Patient is on rotigotine, 4 mg daily.  He is also on carbidopa/levodopa 25/100, 1 tablet 3 times per day.  He has had no falls since last visit.  No lightheadedness or near syncope.  No hallucinations.  Some swerving while driving but doesn't think that it is a vision problem and is not new.  He reports a "grogginess" anywhere from 7-9pm that is a "warm, prickly feeling followed by a cold feeling."  Initially wondered if due to EtOH but doesn't always have EtOH in him when happens (or small amounts).  Never taken BP during event.  PREVIOUS MEDS:  mirapex (states that he had diarrhea but does have hx of UC and diarrhea isn't unusual for him; also had near passing out episode but had been drinking EtOH when it happened)   ALLERGIES:  No Known Allergies  CURRENT MEDICATIONS:  Outpatient Encounter Medications as of 11/28/2018  Medication Sig   carbidopa-levodopa (SINEMET IR) 25-100 MG tablet TAKE 1 TABLET BY MOUTH THREE TIMES A DAY   ferrous sulfate 325 (65 FE) MG  tablet Take 325 mg by mouth daily with breakfast.     losartan (COZAAR) 50 MG tablet TAKE 1 TABLET (50 MG TOTAL) BY MOUTH DAILY. OVERDUE FOR ANNUAL APPT W/LABS MUST SEE PROVIDER FOR FUTURE REFILLS   omeprazole (PRILOSEC) 40 MG capsule TAKE ONE CAPSULE BY MOUTH TWICE DAILY   psyllium (METAMUCIL) 58.6 % powder Take 1 packet by mouth 2 (two) times daily. 1 TBS Twice Daily.    rotigotine (NEUPRO) 4 MG/24HR Place 1 patch onto the skin daily.   Sildenafil Citrate (VIAGRA PO) Take by mouth. Reported on 11/12/2015   [DISCONTINUED] cefdinir (OMNICEF) 300 MG capsule Take 1 capsule (300 mg total) by mouth 2 (two) times daily.   [DISCONTINUED] sodium chloride (OCEAN) 0.65 % SOLN nasal spray Place 1 spray into both nostrils as needed for congestion.   No facility-administered encounter medications on file as of 11/28/2018.     PAST MEDICAL HISTORY:   Past Medical History:  Diagnosis Date   Allergic rhinitis    Anemia, iron deficiency    Arthritis    Blood transfusion without reported diagnosis    Colonic stricture [facility])    ED (erectile dysfunction)    Esophageal stricture    Status post upper endoscopy with esophageal dilatation   Gastric polyps    GERD (gastroesophageal reflux disease)    Hiatal hernia    Hypertension    Hyponatremia    Inguinal hernia    Kidney stone    x 2   Parkinson disease ([place])    Ulcerative colitis    status post total abdominal colectomy with ileoanal anastomosis    PAST SURGICAL HISTORY:   Past Surgical History:  Procedure Laterality Date   COLECTOMY  1992   with ilial anal pull through (for ulcerative colitis)   ETHMOIDECTOMY     and maxillary enterostomies '04, redo in 12/04   [place]     removed 2013   KNEE ARTHROSCOPY Right 2004   LASIK      SOCIAL HISTORY:   Social History   Socioeconomic History   Marital status: Married    Spouse name: Not on file   Number of children: Not on file   Years of education: Not on file   Highest education level: Not on file  Occupational History   Occupation: Public house manager: Programme researcher, broadcasting/film/video  Social Needs   Financial resource strain: Not on file   Food insecurity:    Worry: Not on file    Inability: Not on file   Transportation needs:    Medical: Not on file    Non-medical: Not on file    Tobacco Use   Smoking status: Former Smoker    Last attempt to quit: 09/14/1979    Years since quitting: 39.2   Smokeless tobacco: Never Used   Tobacco comment: smoked for 7 years  Substance and Sexual Activity   Alcohol use: Yes    Alcohol/week: 0.0 standard drinks    Comment: 2 glasses of wine per night   Drug use: No   Sexual activity: Yes  Lifestyle   Physical activity:    Days per week: Not on file    Minutes per session: Not on file   Stress: Not on file  Relationships   Social connections:    Talks on phone: Not on file    Gets together: Not on file    Attends religious service: Not on file  Active member of club or organization: Not on file    Attends meetings of clubs or organizations: Not on file    Relationship status: Not on file   Intimate partner violence:    Fear of current or ex partner: Not on file    Emotionally abused: Not on file    Physically abused: Not on file    Forced sexual activity: Not on file  Other Topics Concern   Not on file  Social History Narrative   [employer] grad   Married 13.5 years- divorced; married '05   No children   Work: Insurance underwriter   Patient is a former smoker. Quit in 1981   Alcohol use- yes social   Daily Caffeine use 2 cups coffee per day   Illicit drug use- no    FAMILY HISTORY:   Family Status  Relation Name Status   Father  Deceased       rectal cancer, multiple myeloma   Mother 63 Deceased       dementia, HTN   Brother twin Alive       heart disease (identical)   Brother  Alive       sinus issues   Neg Hx  (Not Specified)    ROS:  Review of Systems  Constitutional: Negative.   HENT: Negative.   Eyes: Negative.   Respiratory: Negative.   Cardiovascular: Negative.   Gastrointestinal: Negative.   Genitourinary: Negative.   Skin: Negative.      PHYSICAL EXAMINATION:    VITALS:   Vitals:   11/28/18 0819  BP: 110/70  Pulse: 60  Temp: (!) 97.5 F (36.4 C)  TempSrc: Oral   SpO2: 98%  Weight: 173 lb (78.5 kg)  Height: _0  (1.702 m)   Wt Readings from Last 3 Encounters:  11/28/18 173 lb (78.5 kg)  09/20/18 174 lb 3.2 oz (79 kg)  08/28/18 174 lb (78.9 kg)   GEN:  The patient appears stated age and is in NAD. HEENT:  Normocephalic, atraumatic.  The mucous membranes are moist. The superficial temporal arteries are without ropiness or tenderness. CV:  RRR Lungs:  CTAB Neck/HEME:  There are no carotid bruits bilaterally.  Neurological examination:  Orientation: The patient is alert and oriented x3. Cranial nerves: There is good facial symmetry. The speech is fluent and clear but hypophonic.  Soft palate rises symmetrically and there is no tongue deviation. Hearing is intact to conversational tone. Sensation: Sensation is intact to light touch throughout Motor: Strength is 5/5 in the bilateral upper and lower extremities.   Shoulder shrug is equal and symmetric.  There is no pronator drift.  Movement examination: Tone: There is normal tone in the UE/LE Abnormal movements: there is intermittent, independent rest tremor in the right and left upper extremities.  It is mild. Coordination:  There is decremation with finger taps bilaterally and with toe taps bilaterally. Gait and Station: The patient has no difficulty arising out of a deep-seated chair without the use of the hands. The patient's stride length is very good but purposeful (same as prior)  LABS  Lab Results  Component Value Date   TSH 2.20 04/06/2017     Chemistry      Component Value Date/Time   NA 133 (L) 06/27/2018 0915   K 4.4 06/27/2018 0915   CL 102 06/27/2018 0915   CO2 23 06/27/2018 0915   BUN 21 06/27/2018 0915   CREATININE 1.07 06/27/2018 0915      Component Value  Date/Time   CALCIUM 9.4 06/27/2018 0915   ALKPHOS 37 (L) 05/27/2017 1138   AST 17 06/27/2018 0915   ALT 18 06/27/2018 0915   BILITOT 0.5 06/27/2018 0915     Lab Results  Component Value Date   WBC 3.8 (L)  05/28/2017   HGB 12.3 (L) 05/28/2017   HCT 35.7 (L) 05/28/2017   MCV 93.5 05/28/2017   PLT 158 05/28/2017   Lab Results  Component Value Date   VITAMINB12 589 08/02/2016     ASSESSMENT/PLAN:  1. Idiopathic Parkinson's disease.  The patient just barely meets [place] brain bank criteria but he does meet it.  -We discussed the diagnosis as well as pathophysiology of the disease again as wife not here last visit.  We discussed treatment options as well as prognostic indicators.  Patient education was provided.  -Not convinced that all of the reported side effects were from the pramipexole ER but since we have other options decided to hold on reusing unless really need it  -continue neupro 4 mg daily.  Risks, benefits, side effects and alternative therapies were discussed.  The opportunity to ask questions was given and they were answered to the best of my ability.  The patient expressed understanding and willingness to follow the outlined treatment protocols.  -continue Carbidopa/levodopa 25/100, 1 tablet 3 times per day.    -Encouraged increasing exercise.  -Check CBC given it has not been checked since 2018 and history of leukopenia.  Check TSH. 2.  Trigeminal neuralgia, currently pain free  -Trileptal caused hyponatremia.     -Gabapentin has not been beneficial.  -He got a surgical opinion from Dr. Cyndy Freeze on 11/22/2016.  Surgical decompression was offered given the MRI demonstrated AICA crossing near L trigeminal nerve root.  Pt had second opinion with Dr. Vertell Limber but hasn't had pain since so just holding for now 3.  Memory change  -Neurocognitive testing done in January, 2019 demonstrated mild cognitive impairment.  No evidence of dementia.  -asks me today about OTC supplements for memory and didn't recommend them.  -Given phone to the evaluator driving company.  Told him to make an appointment. 4.  ?near syncope  -take BP during event at home  -has been off of losartan for 2 weeks and BP in  the office was still pretty low.    -encouraged him to f/u with PCP  5.  Follow up is anticipated in the next few months, sooner should new neurologic issues arise.  Much greater than 50% of this visit was spent in counseling and coordinating care.  Total face to face time:  25 min

## 2018-11-28 ENCOUNTER — Encounter: Payer: Self-pay | Admitting: Neurology

## 2018-11-28 ENCOUNTER — Ambulatory Visit: Payer: Medicare HMO | Admitting: Neurology

## 2018-11-28 ENCOUNTER — Other Ambulatory Visit (INDEPENDENT_AMBULATORY_CARE_PROVIDER_SITE_OTHER): Payer: Medicare HMO

## 2018-11-28 ENCOUNTER — Other Ambulatory Visit: Payer: Self-pay

## 2018-11-28 VITALS — BP 110/70 | HR 60 | Temp 97.5°F | Ht 67.0 in | Wt 173.0 lb

## 2018-11-28 DIAGNOSIS — D72819 Decreased white blood cell count, unspecified: Secondary | ICD-10-CM

## 2018-11-28 DIAGNOSIS — G20 Parkinson's disease: Secondary | ICD-10-CM

## 2018-11-28 DIAGNOSIS — R251 Tremor, unspecified: Secondary | ICD-10-CM

## 2018-11-28 DIAGNOSIS — R55 Syncope and collapse: Secondary | ICD-10-CM

## 2018-11-28 LAB — CBC WITH DIFFERENTIAL/PLATELET
Absolute Monocytes: 384 cells/uL (ref 200–950)
Basophils Absolute: 42 cells/uL (ref 0–200)
Basophils Relative: 1.1 %
Eosinophils Absolute: 110 cells/uL (ref 15–500)
Eosinophils Relative: 2.9 %
HCT: 36.7 % — ABNORMAL LOW (ref 38.5–50.0)
Hemoglobin: 12.5 g/dL — ABNORMAL LOW (ref 13.2–17.1)
Lymphs Abs: 513 cells/uL — ABNORMAL LOW (ref 850–3900)
MCH: 33.4 pg — AB (ref 27.0–33.0)
MCHC: 34.1 g/dL (ref 32.0–36.0)
MCV: 98.1 fL (ref 80.0–100.0)
MONOS PCT: 10.1 %
MPV: 9.5 fL (ref 7.5–12.5)
Neutro Abs: 2751 cells/uL (ref 1500–7800)
Neutrophils Relative %: 72.4 %
Platelets: 211 10*3/uL (ref 140–400)
RBC: 3.74 10*6/uL — ABNORMAL LOW (ref 4.20–5.80)
RDW: 13 % (ref 11.0–15.0)
Total Lymphocyte: 13.5 %
WBC: 3.8 10*3/uL (ref 3.8–10.8)

## 2018-11-28 LAB — TSH: TSH: 2.4 mIU/L (ref 0.40–4.50)

## 2018-11-28 NOTE — Patient Instructions (Signed)
1. Your provider has requested that you have labwork completed today. Please go to [facility] ([address]) on the second floor of this building before leaving the office today. You do not need to check in. If you are not called within 15 minutes please check with the front desk.   2. If you are interested in the driving assessment, you can contact [employer] in [place]. [phone].

## 2018-11-29 ENCOUNTER — Telehealth: Payer: Self-pay | Admitting: Neurology

## 2018-11-29 NOTE — Telephone Encounter (Signed)
-----   Message from [place], DO sent at 11/29/2018  9:33 AM EDT ----- Let pt know that CBC is stable from a year ago.  Mildly anemic and mildly leukopenic but has been that way long time.  TSH is good

## 2018-11-29 NOTE — Telephone Encounter (Signed)
Mychart message sent to patient.

## 2019-01-05 ENCOUNTER — Other Ambulatory Visit: Payer: Self-pay | Admitting: Neurology

## 2019-01-20 ENCOUNTER — Other Ambulatory Visit: Payer: Self-pay

## 2019-01-20 ENCOUNTER — Encounter: Payer: Self-pay | Admitting: Family Medicine

## 2019-01-20 ENCOUNTER — Ambulatory Visit (INDEPENDENT_AMBULATORY_CARE_PROVIDER_SITE_OTHER): Payer: Medicare HMO | Admitting: Family Medicine

## 2019-01-20 DIAGNOSIS — J069 Acute upper respiratory infection, unspecified: Secondary | ICD-10-CM | POA: Diagnosis not present

## 2019-01-20 MED ORDER — CEFDINIR 300 MG PO CAPS: 300.0000 mg | ORAL_CAPSULE | Freq: Two times a day (BID) | ORAL | 0 refills | Status: DC

## 2019-01-20 NOTE — Patient Instructions (Signed)
Isolate yourself  Treat your symptoms as needed  If facial pressure/pain or cough worsen-you can start the cefdinir I sent in (otherwise hold it) Drink fluids and rest  Continue sinus irrigation   I cannot rule out a covid infection  Watch for fever/shortness or breath/body aches or wheezing Please alert [place] if symptoms worsen /go to the ER if needed

## 2019-01-20 NOTE — Assessment & Plan Note (Signed)
Mild with nasal congestion and mild prod cough No fever No known covid contacts/working from home Hx of recurrent /chronic sinusitis (per pt)  Sent px for cefdinir to hold- will start if his sinus pressure turns into pain or he gets purulent nasal drainage  Enc fluids Rest Tylenol prn pain/temp  Isolate (cannot guarantee this is not early covid)- pt voiced understanding  Enc to update and seek ER care if fever/increased or severe cough/sob/wheeze -he voiced understanding Update if not starting to improve in a week or if worsening

## 2019-01-20 NOTE — Progress Notes (Signed)
Virtual Visit via Video Note  I connected with William [name] on 01/20/19 at  9:20 AM EDT by a video enabled telemedicine application and verified that I am speaking with the correct person using two identifiers.  Location: Patient: home Provider: office    I discussed the limitations of evaluation and management by telemedicine and the availability of in person appointments. The patient expressed understanding and agreed to proceed.  History of Present Illness: Presents with uri   A little chest pressure with cough (little productive)- lt green  He is prone to uri and sinus problems (irrigates and he has freq sinus infections)   Reacts to pollen a bit  Was not outdoors yesterday   Throat is scratchy  Some pnd  Nasal congestion - used saline and no more stuffy than usual / clear mucous   No facial pain right now  Some pressure - not severe  Worst across forehead  No fever - 96.9 temp   No chills or sweats or body aches Tremor (parkinsons) was a bit worse yesterday    Is achy in upper back and shoulder blades (this happens when he gets a sinus infection) No ear pain   No wheezing  Used to smoke- quit in 1981   otc : no medicines   No known contacts with covid  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  HENT: Positive for congestion and sore throat. Negative for ear discharge, ear pain and sinus pain.   Eyes: Negative for blurred vision, discharge and redness.  Respiratory: Positive for cough and sputum production. Negative for hemoptysis, shortness of breath, wheezing and stridor.   Cardiovascular: Negative for chest pain and palpitations.  Gastrointestinal: Negative for abdominal pain, diarrhea, nausea and vomiting.  Musculoskeletal: Negative for myalgias.  Skin: Negative for itching.  Neurological: Negative for dizziness and headaches.  Endo/Heme/Allergies:  Positive for environmental allergies.     Observations/Objective:  Patient Active Problem List   Diagnosis Date Noted  . URI (upper respiratory infection) 01/20/2019  . Chest pain 05/28/2017  . Hyperlipidemia 05/28/2017  . Bradycardia 05/28/2017  . Chest pain, atypical 05/27/2017  . Lightheadedness 05/27/2017  . Finger laceration 04/04/2017  . Acute bronchitis 08/09/2016  . Parkinson's disease ([place]) 02/26/2016  . Eustachian tube dysfunction 02/06/2016  . Hyponatremia 01/26/2016  . Tremor 11/12/2015  . Visual-spatial impairment 11/12/2015  . Stress at work 10/24/2014  . Nonspecific abnormal electrocardiogram (ECG) (EKG) 06/18/2014  . S/P colectomy 06/18/2014  . Well adult exam 06/18/2014  . Renal stones 06/18/2014  . Elevated BP 06/18/2014  . Headache(784.0) 04/25/2014  . Essential hypertension, benign 04/25/2014  . Serous otitis media 03/21/2014  . Travel advice encounter 04/17/2013  . Calcium nephrolithiasis 10/04/2012  . Routine health maintenance 10/04/2012  . Acute sinusitis 05/24/2012  . Unspecified intestinal obstruction 12/03/2008  . Other diseases of nasal cavity and sinuses(478.19) 05/30/2008  . GERD 02/26/2008  . PEPTIC STRICTURE 02/22/2008  . HIATAL HERNIA 02/22/2008  . Chest tightness or pressure 01/09/2008  . ANEMIA-IRON DEFICIENCY 04/07/2007  . ERECTILE DYSFUNCTION 04/07/2007  . Allergic rhinitis 04/07/2007  . COLITIS, ULCERATIVE NOS 04/07/2007  . GASTRIC POLYP, HX OF 04/07/2007   Past Medical History:  Diagnosis Date  . Allergic rhinitis   . Anemia, iron deficiency   . Arthritis   . Blood transfusion without reported diagnosis   . Colonic stricture ([place])   . ED (erectile  dysfunction)   . Esophageal stricture    Status post upper endoscopy with esophageal dilatation  . Gastric polyps   . GERD (gastroesophageal reflux disease)   . Hiatal hernia   . Hypertension   . Hyponatremia   . Inguinal hernia   . Kidney stone    x 2  . Parkinson disease  ([place])   . Ulcerative colitis    status post total abdominal colectomy with ileoanal anastomosis   Past Surgical History:  Procedure Laterality Date  . COLECTOMY  1992   with ilial anal pull through (for ulcerative colitis)  . ETHMOIDECTOMY     and maxillary enterostomies '04, redo in 12/04  . INGUINAL HERNIA REPAIR    . KIDNEY STONE SURGERY     removed 2013  . KNEE ARTHROSCOPY Right 2004  . LASIK     Social History   Tobacco Use  . Smoking status: Former Smoker    Last attempt to quit: 09/14/1979    Years since quitting: 39.3  . Smokeless tobacco: Never Used  . Tobacco comment: smoked for 7 years  Substance Use Topics  . Alcohol use: Yes    Alcohol/week: 0.0 standard drinks    Comment: 2 glasses of wine per night  . Drug use: No   Family History  Problem Relation Age of Onset  . Cancer Father        rectal cancer and MM  . Rectal cancer Father   . Dementia Mother   . Hypertension Mother   . Heart disease Brother        valve repaired at 46  . Colon cancer Neg Hx   . Esophageal cancer Neg Hx   . Stomach cancer Neg Hx    No Known Allergies Current Outpatient Medications on File Prior to Visit  Medication Sig Dispense Refill  . carbidopa-levodopa (SINEMET IR) 25-100 MG tablet TAKE 1 TABLET BY MOUTH THREE TIMES A DAY 270 tablet 1  . ferrous sulfate 325 (65 FE) MG tablet Take 325 mg by mouth daily with breakfast.      . NEUPRO 4 MG/24HR APPLY 1 PATCH ONTO THE SKIN EVERY DAY 30 patch 5  . omeprazole (PRILOSEC) 40 MG capsule TAKE ONE CAPSULE BY MOUTH TWICE DAILY 180 capsule 3  . psyllium (METAMUCIL) 58.6 % powder Take 1 packet by mouth 2 (two) times daily. 1 TBS Twice Daily.    . Sildenafil Citrate (VIAGRA PO) Take by mouth. Reported on 11/12/2015     No current facility-administered medications on file prior to visit.     Assessment and Plan: Problem List Items Addressed This Visit      Respiratory   URI (upper respiratory infection) - Primary    Mild with nasal  congestion and mild prod cough No fever No known covid contacts/working from home Hx of recurrent /chronic sinusitis (per pt)  Sent px for cefdinir to hold- will start if his sinus pressure turns into pain or he gets purulent nasal drainage  Enc fluids Rest Tylenol prn pain/temp  Isolate (cannot guarantee this is not early covid)- pt voiced understanding  Enc to update and seek ER care if fever/increased or severe cough/sob/wheeze -he voiced understanding Update if not starting to improve in a week or if worsening        Relevant Medications   cefdinir (OMNICEF) 300 MG capsule       Follow Up Instructions: Isolate yourself  Treat your symptoms as needed  If facial pressure/pain or cough worsen-you can start  the cefdinir I sent in (otherwise hold it) Drink fluids and rest  Continue sinus irrigation   I cannot rule out a covid infection  Watch for fever/shortness or breath/body aches or wheezing Please alert [place] if symptoms worsen /go to the ER if needed    I discussed the assessment and treatment plan with the patient. The patient was provided an opportunity to ask questions and all were answered. The patient agreed with the plan and demonstrated an understanding of the instructions.   The patient was advised to call back or seek an in-person evaluation if the symptoms worsen or if the condition fails to improve as anticipated.   Loura Pardon, MD

## 2019-02-03 IMAGING — DX DG CHEST 2V
2 series · 2 of 2 positions shown · non-contrast
Comparison: 10/23/2009

CLINICAL DATA: Left chest pain

EXAM:
CHEST  2 VIEW

[chest pa]
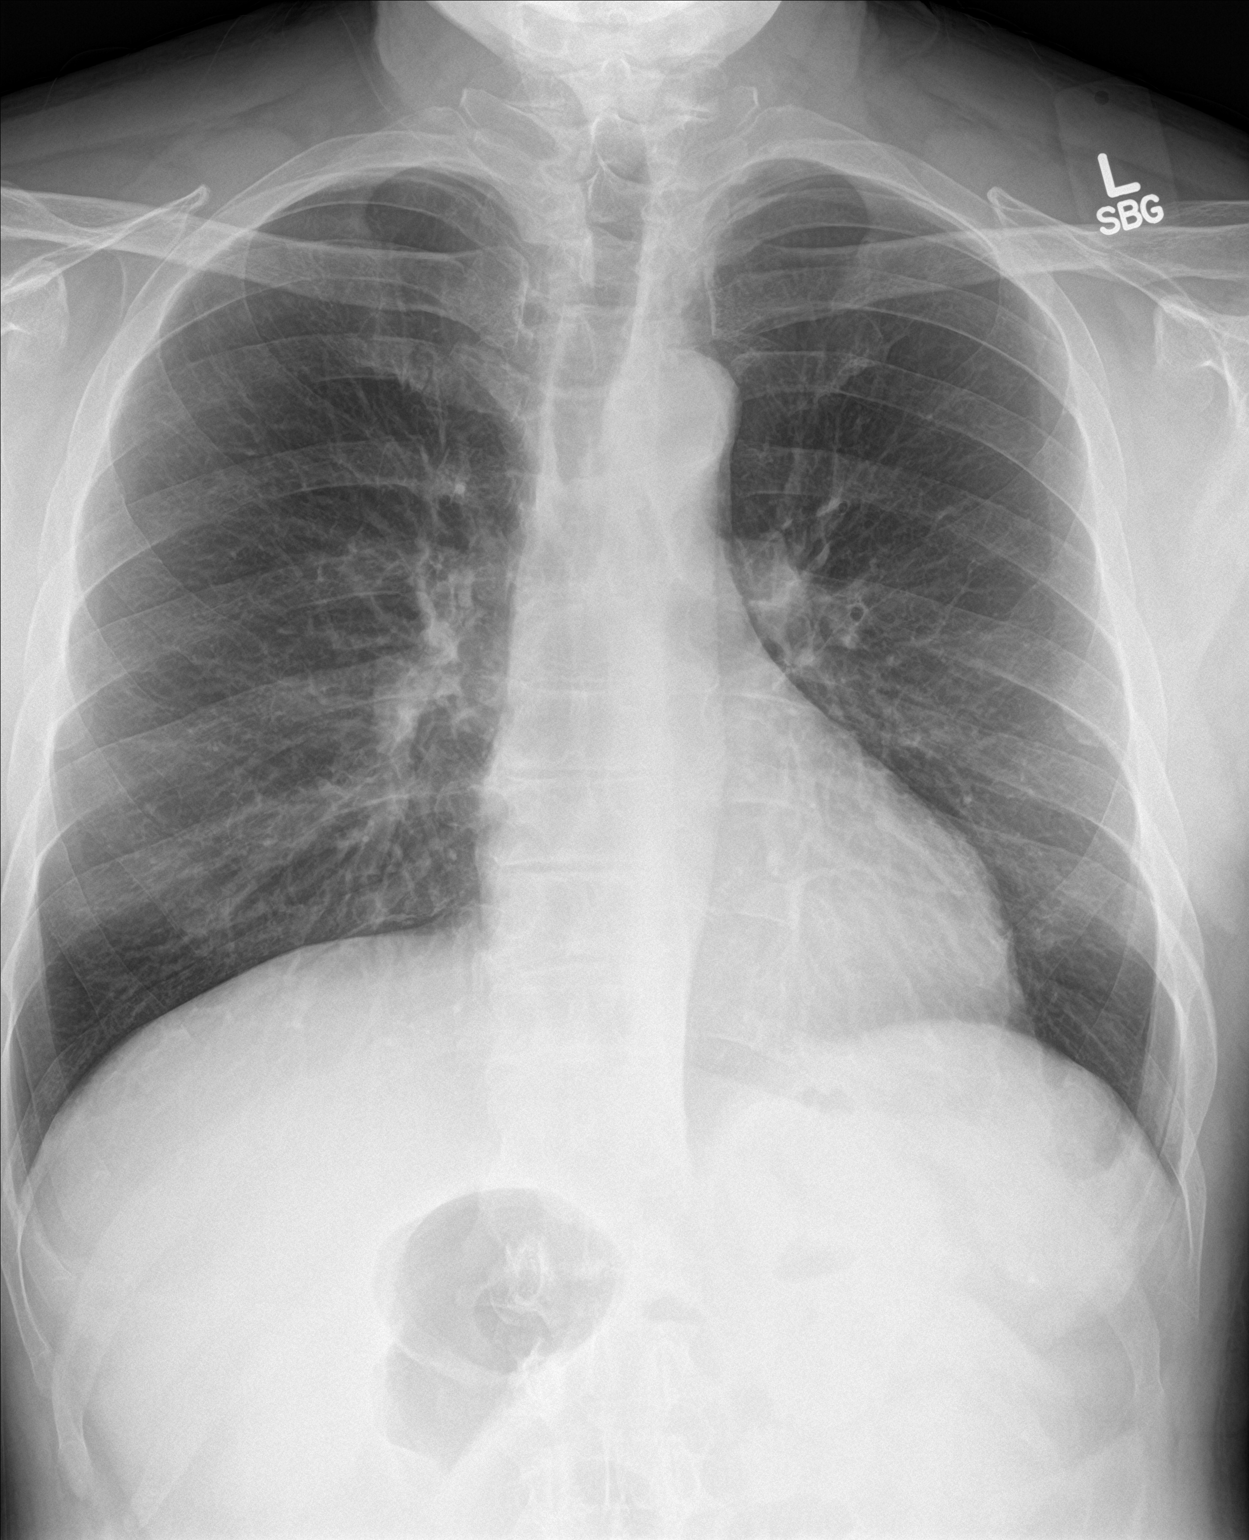

[chest lat]
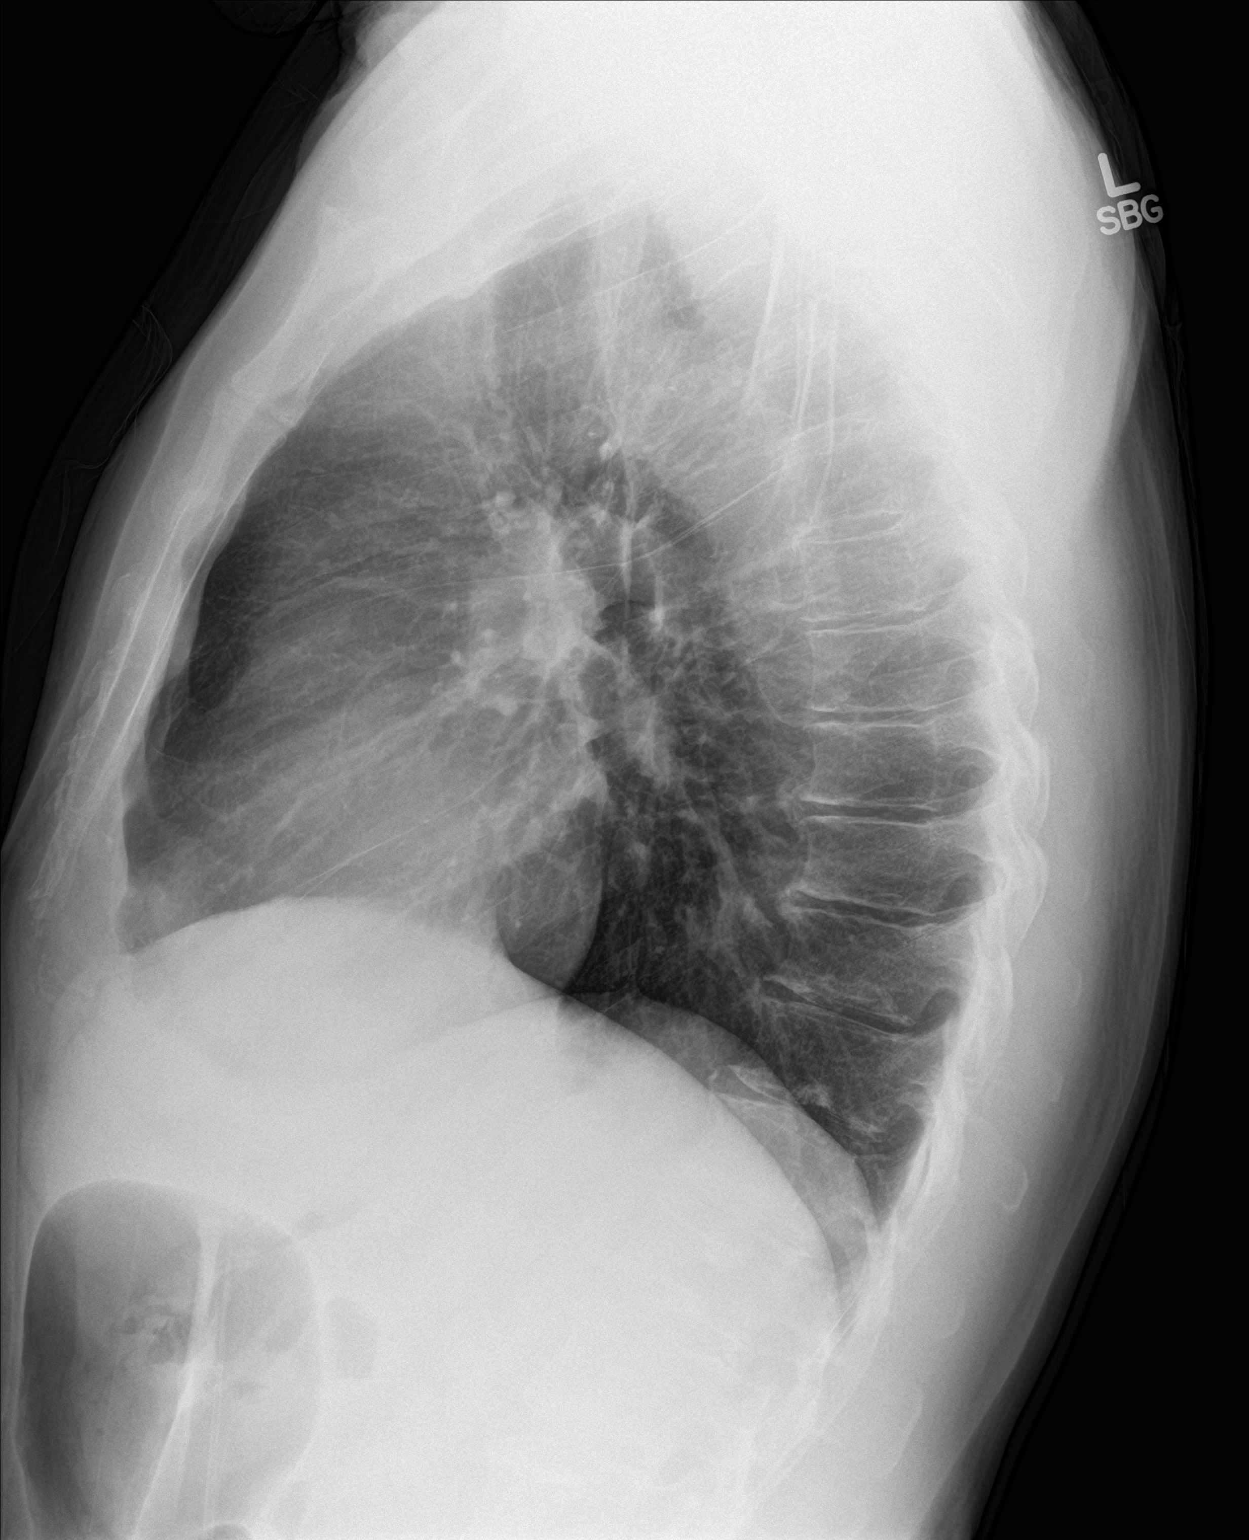

[2 of 2 positions shown; findings below may reference images not displayed]

FINDINGS: The heart size and mediastinal contours are within normal limits.
Both lungs are clear. The visualized skeletal structures are
unremarkable.
IMPRESSION: No active cardiopulmonary disease.

## 2019-04-26 NOTE — Progress Notes (Signed)
William [name] was seen today in the movement disorders clinic for neurologic consultation at the request of Plotnikov, Evie Lacks, MD.  The consultation is for the evaluation of tremor and gait instability.  Tremor is in both hands, but it is more in the R hand.  Pt states that he thought it was going on for 3 months but when he talked to his brother, he said he noted jaw tremor last summer.  He notes it at rest.  He also noted some shuffling.     02/26/16 update:  The patient follows up today regarding his new diagnosis of Parkinsons disease.  His wife accompanies him today and supplements the history.  He admits that there was an "emotional adjustment" to make over the diagnosis.  He states that the "jittery feeling" goes "up and down."  He is currently on no medications.  He has been exercising a "bunch".  Doing 30 min of cardio on home treadmill 4 days a week and does some weights 2 days a week.   Last visit, I talked to him about decreasing his alcohol content.  I checked lab work and his sodium was 128.  I asked him to follow-up with his primary care provider.  He did see his nurse practitioner and she felt that this was just chronic hyponatremia.  No falls but occasionally has "balance uncertainty", lightheadedness/near syncope, hallucinations since last visit.  Mood has been more irritable and wife states that she has noted a "real change."   Drinking 1-2 glasses of wine/night and sometimes will have "cocktail" as well.    06/28/16 update:  The patient follows up today, accompanied by his wife who supplements the history.  He is on no medication for this, as he has not wanted to be.  He has had no falls since last visit.  No lightheadedness or near syncope. He is doing cardio 5 days a week and feels "zapped" for the rest of the day.  Has had a little bit more tremor because of more stress.   In regards to alcohol, the patient states that he is drinking about the same, maybe a little less scotch.      07/30/16 update:  Pt f/u today.  He called his wife and she was in on the conference call.  I started him on Mirapex ER, and worked to 1.5 mg daily.  He called me and reported side effects with the medication.  He reported that he had a near passing out episode.  This occurred after drinking alcohol and his wife and friend had to help him out of the restaurant.  He called his pharmacist, who told him he should not be drinking alcohol with the medication.  We have discussed his alcohol intake several times in the past.  Ultimately, the patient ended up discontinuing his Mirapex ER.  States that it caused weight loss and diarrhea.  He does have a hx of UC and has a hx of diarrhea but not that often.  Pt concerned about low "BP."  Wife complains that he generally doesn't feel good.    09/14/16 update: The patient follows up today.  I have reviewed records since our last visit.  His losartan dose was initially dropped by half and about a week later, his primary care physician stopped the medication altogether.  The patient states that he is doing well off of the medication.  He has been keeping a log of it and brought it in for review  and the numbers looked good.  Otherwise, no changes since last visit besides for the fact that he had a URI.  States that he has had 3 rounds of antibiotics and still has productive cough in the AM.  Also c/o pain in the nose.  Saw ENT and was given ointment and not helpful.  Notes that has to purposefully swing arms to feel balanced.  No falls but some near falls.  R leg seems to "linger" when gets up and needs to turn - R leg isn't as fast to move.  Wife notes that he seems depressed.  Still drinking about same amount of EtOH.    10/14/16 update:  Patient follows up today, accompanied by his wife who supplements the history.  I spoke with his ENT physician prior to today's visit.  The patient is having lancinating pain in the nose and face.  He tried various creams and ointments  without relief and thought that perhaps this represented trigeminal neuralgia.  The patient states that the pain is located over the L nose and radiates around the L eye.  Pt states that it is a "jabbing pain."  It happens on and off all day long.  Chewing will make it worse as does looking down.  Doesn't know if cold will set it off.  Has had productive cough, but some better after abx x 3. No neuroimaging.   In regards to his Parkinson's, he was started on Neupro last visit and worked to 4 mg daily.  He thinks that medication worked well, without side effects.  Tremor improved. No compulsive behaviors.  No sleep attacks.  No hallucinations.  No lightheadedness or near syncope.  12/22/16 update:  Patient follows up today.  The patient remains on Neupro, 4 mg daily for Parkinson's disease. Having some skin irritation with the patch.   Patch is helping sx's.   No falls other that tripping over the dog.  No lightheadedness or near syncope.  No hallucinations.  No sleep attacks.  No compulsive behaviors.  His bigger issue has been trigeminal neuralgia.  We started him on Trileptal, which did seem to work, but caused hyponatremia.  We took him off of that and changed him to gabapentin, which did not help.  He is off of that now.  He got a surgical opinion from Dr. Cyndy Freeze on 11/22/2016.  Surgical decompression was offered given the MRI demonstrated AICA crossing near L trigeminal nerve root.  Pt wanted to think about that before proceeding and has not yet been back to neurosx.  Pain comes and goes.  Worse when in the shower and water hits it.    07/14/17 update:  Pt seen today in follow-up for his Parkinson's disease.  He is on Neupro, 4 mg daily.  He ran out for a day and he knew it was working.  He has no compulsive behaviors.  No sleep attacks.  No falls.  Worked out 6 days last week.  Reviewed records since our last visit.  He was in the hospital in September for chest pain.  Cardiology was consulted, but the  patient wanted to leave the hospital before they were actually able to see him.  He did see Dr. Claiborne Billings as an outpatient on October 4.  He felt chest pain was noncardiac in nature and perhaps related to anxiety or reflux.  He did just call here at the end of October and was concerned about memory and wanted neurocognitive testing.  We opted to discuss  this today.  States that his wife is having memory issues and he agreed to do some memory testing if she would.  He doesn't think that his memory is quite as good as in the past.   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  He changed new jobs a month ago.  He does think he is doing ok with this.  He did meet with Dr. Vertell Limber for [place] but he hasn't had any pain since but he states that he wanted to establish a relationship with him.    02/24/18 update: Patient is seen today in follow-up for Parkinson's disease.  I have not seen him since last November.  This patient is accompanied in the office by his spouse who supplements the history.  His daughter is also on the phone.   He is currently on Neupro, 4 mg daily.  No site reactions.  No compulsive behaviors.  No sleep attacks.  No hallucinations.  No falls but notes balance isn't as good.  He is noting more tremor.  He is noting start hesitation and "some short choppy steps."    He did have neurocognitive testing with Dr. Si Raider on October 10, 2017.  I have reviewed this.  There was no dementia.  There was evidence of mild cognitive impairment.  Patient has been to primary care and cardiology since last visit.  Those records are reviewed.  No follow-up was needed from a cardiology standpoint.  Chest pain had atypical features.  He is doing cardio exercises 3 days per week.    06/27/18 update: Patient is seen today in follow-up for Parkinson's disease.  This patient is accompanied in the office by his spouse who supplements the history.He is on Neupro, 4  mg daily.  Last visit, levodopa was added and he reports that it was really helpful at first but seems not quite as effective.  He noticed it really helped with shag dancing but maybe a little less so now.  He does that one time every few weeks.  Levodopa also helped start hesitation.  He is had one fall getting off a bar stool at home.   No lightheadedness or near syncope.  No hallucinations.  No compulsive behaviors or sleep attacks.  11/27/2018 update: Patient is seen today in follow-up for Parkinson's disease, accompanied by his wife who supplements the history.  Patient is on rotigotine, 4 mg daily.  He is also on carbidopa/levodopa 25/100, 1 tablet 3 times per day.  He has had no falls since last visit.  No lightheadedness or near syncope.  No hallucinations.  Some swerving while driving but doesn't think that it is a vision problem and is not new.  He reports a "grogginess" anywhere from 7-9pm that is a "warm, prickly feeling followed by a cold feeling."  Initially wondered if due to EtOH but doesn't always have EtOH in him when happens (or small amounts).  Never taken BP during event.  04/30/19 update: Patient seen today in follow-up for Parkinson's disease.  Patient is on carbidopa/levodopa 25/100, 1 tablet 3 times per day and rotigotine patch, 4 mg daily.   No significant site reactions.   No compulsive behaviors.  No sleep attacks.   Works from home and does well with that.   Pt denies falls.  Noting some dragging of the right foot.  Pt denies lightheadedness, near syncope.  No hallucinations.  Mood has been up  and down but admits to lots of personal stress.  He is free of facial pain and has been for several years.    PREVIOUS MEDS:  mirapex (states that he had diarrhea but does have hx of UC and diarrhea isn't unusual for him; also had near passing out episode but had been drinking EtOH when it happened)   ALLERGIES:  No Known Allergies  CURRENT MEDICATIONS:  Outpatient Encounter Medications  as of 04/30/2019  Medication Sig   carbidopa-levodopa (SINEMET IR) 25-100 MG tablet TAKE 1 TABLET BY MOUTH THREE TIMES A DAY (Patient taking differently: 2 in the AM, 1 in the afternoon and evening)   ferrous sulfate 325 (65 FE) MG tablet Take 325 mg by mouth daily with breakfast.     NEUPRO 4 MG/24HR APPLY 1 PATCH ONTO THE SKIN EVERY DAY   omeprazole (PRILOSEC) 40 MG capsule TAKE ONE CAPSULE BY MOUTH TWICE DAILY   psyllium (METAMUCIL) 58.6 % powder Take 1 packet by mouth 2 (two) times daily. 1 TBS Twice Daily.   Sildenafil Citrate (VIAGRA PO) Take by mouth. Reported on 11/12/2015   tricitrates (POLYCITRA-LC) [phone] MG/5ML SOLN TAKE 30 MLS BEFORE MEALS   [DISCONTINUED] cefdinir (OMNICEF) 300 MG capsule Take 1 capsule (300 mg total) by mouth 2 (two) times daily. (Patient not taking: Reported on 04/30/2019)   No facility-administered encounter medications on file as of 04/30/2019.     PAST MEDICAL HISTORY:   Past Medical History:  Diagnosis Date   Allergic rhinitis    Anemia, iron deficiency    Arthritis    Blood transfusion without reported diagnosis    Colonic stricture [facility])    ED (erectile dysfunction)    Esophageal stricture    Status post upper endoscopy with esophageal dilatation   Gastric polyps    GERD (gastroesophageal reflux disease)    Hiatal hernia    Hypertension    Hyponatremia    Inguinal hernia    Kidney stone    x 2   Parkinson disease ([place])    Ulcerative colitis    status post total abdominal colectomy with ileoanal anastomosis    PAST SURGICAL HISTORY:   Past Surgical History:  Procedure Laterality Date   COLECTOMY  1992   with ilial anal pull through (for ulcerative colitis)   ETHMOIDECTOMY     and maxillary enterostomies '04, redo in 12/04   Star     removed 2013   KNEE ARTHROSCOPY Right 2004   LASIK      SOCIAL HISTORY:   Social History   Socioeconomic History    Marital status: Married    Spouse name: Not on file   Number of children: 0   Years of education: Not on file   Highest education level: Bachelor's degree (e.g., BA, AB, BS)  Occupational History   Occupation: Public house manager: [employer] resource strain: Not on file   Food insecurity    Worry: Not on file    Inability: Not on file   Transportation needs    Medical: Not on file    Non-medical: Not on file  Tobacco Use   Smoking status: Former Smoker    Quit date: 09/14/1979    Years since quitting: 39.6   Smokeless tobacco: Never Used   Tobacco comment: smoked for 7 years  Substance and Sexual Activity   Alcohol use: Yes    Alcohol/week: 0.0 standard  drinks    Comment: 2 glasses of wine per night   Drug use: No   Sexual activity: Yes  Lifestyle   Physical activity    Days per week: Not on file    Minutes per session: Not on file   Stress: Not on file  Relationships   Social connections    Talks on phone: Not on file    Gets together: Not on file    Attends religious service: Not on file    Active member of club or organization: Not on file    Attends meetings of clubs or organizations: Not on file    Relationship status: Not on file   Intimate partner violence    Fear of current or ex partner: Not on file    Emotionally abused: Not on file    Physically abused: Not on file    Forced sexual activity: Not on file  Other Topics Concern   Not on file  Social History Narrative   [employer] grad   Married 13.5 years- divorced; married '05   No children   Work: Insurance underwriter   Patient is a former smoker. Quit in 1981   Alcohol use- yes social   Daily Caffeine use 2 cups coffee per day   Illicit drug use- no    FAMILY HISTORY:   Family Status  Relation Name Status   Father  Deceased       rectal cancer, multiple myeloma   Mother 49 Deceased       dementia, HTN   Brother twin Alive       heart  disease (identical)   Brother  Alive       sinus issues   Neg Hx  (Not Specified)    ROS:  Review of Systems  Constitutional: Negative.   HENT: Negative.   Eyes: Negative.   Cardiovascular: Negative.   Gastrointestinal: Negative.   Genitourinary: Negative.   Skin: Negative.      PHYSICAL EXAMINATION:    VITALS:   Vitals:   04/30/19 0830  BP: 132/76  Pulse: 72  SpO2: 97%  Weight: 172 lb (78 kg)  Height: 5' 6.5" (1.689 m)   Wt Readings from Last 3 Encounters:  04/30/19 172 lb (78 kg)  11/28/18 173 lb (78.5 kg)  09/20/18 174 lb 3.2 oz (79 kg)   GEN:  The patient appears stated age and is in NAD. HEENT:  Normocephalic, atraumatic.  The mucous membranes are moist. The superficial temporal arteries are without ropiness or tenderness. CV:  RRR Lungs:  CTAB Neck/HEME:  There are no carotid bruits bilaterally.  Neurological examination:  Orientation: The patient is alert and oriented x3. Cranial nerves: There is good facial symmetry. The speech is fluent and clear. Soft palate rises symmetrically and there is no tongue deviation. Hearing is intact to conversational tone. Sensation: Sensation is intact to light touch throughout Motor: Strength is 5/5 in the bilateral upper and lower extremities.   Shoulder shrug is equal and symmetric.  There is no pronator drift.   Movement examination: Tone: There is mild increased tone in the RUE Abnormal movements: there is intermittent, independent rest tremor in the right and left upper extremities.  It is mild and same as previously Coordination:  There is decremation, with any form of RAMS, including alternating supination and pronation of the forearm, hand opening and closing, finger taps, heel taps and toe taps on the right Gait and Station: The patient has no difficulty arising out  of a deep-seated chair without the use of the hands. The patient's stride length is very good but purposeful (same as prior)  LABS  Lab Results    Component Value Date   TSH 2.40 11/28/2018     Chemistry      Component Value Date/Time   NA 133 (L) 06/27/2018 0915   K 4.4 06/27/2018 0915   CL 102 06/27/2018 0915   CO2 23 06/27/2018 0915   BUN 21 06/27/2018 0915   CREATININE 1.07 06/27/2018 0915      Component Value Date/Time   CALCIUM 9.4 06/27/2018 0915   ALKPHOS 37 (L) 05/27/2017 1138   AST 17 06/27/2018 0915   ALT 18 06/27/2018 0915   BILITOT 0.5 06/27/2018 0915     Lab Results  Component Value Date   WBC 3.8 11/28/2018   HGB 12.5 (L) 11/28/2018   HCT 36.7 (L) 11/28/2018   MCV 98.1 11/28/2018   PLT 211 11/28/2018   Lab Results  Component Value Date   VITAMINB12 589 08/02/2016     ASSESSMENT/PLAN:  1. Idiopathic Parkinson's disease.  The patient just barely meets [place] brain bank criteria but he does meet it.  -We discussed the diagnosis as well as pathophysiology of the disease again as wife not here last visit.  We discussed treatment options as well as prognostic indicators.  Patient education was provided.  -Not convinced that all of the reported side effects were from the pramipexole ER but since we have other options decided to hold on reusing unless really need it  -continue neupro 4 mg daily.  Risks, benefits, side effects and alternative therapies were discussed.  The opportunity to ask questions was given and they were answered to the best of my ability.  The patient expressed understanding and willingness to follow the outlined treatment protocols.  -increase carbidopa/levodopa 25/100, 2 in the AM, in the afternoon and evening.  May need further increase but will re-evaluate in the future  -Encouraged increasing exercise. 2.  Trigeminal neuralgia, currently pain free and has been for a few years now  -Trileptal caused hyponatremia.     -Gabapentin has not been beneficial.  -He got a surgical opinion from Dr. Cyndy Freeze on 11/22/2016.  Surgical decompression was offered given the MRI demonstrated AICA  crossing near L trigeminal nerve root.  Pt had second opinion with Dr. Vertell Limber but hasn't had pain since so just holding for now 3.  Memory change  -Neurocognitive testing done in January, 2019 demonstrated mild cognitive impairment.  No evidence of dementia.  -asks me today about OTC supplements for memory and didn't recommend them. 4.  Follow up is anticipated in the next 4-6 months, sooner should new neurologic issues arise.  Much greater than 50% of this visit was spent in counseling and coordinating care.  Total face to face time:  25 min

## 2019-04-30 ENCOUNTER — Ambulatory Visit: Payer: Medicare HMO | Admitting: Neurology

## 2019-04-30 ENCOUNTER — Encounter: Payer: Self-pay | Admitting: Neurology

## 2019-04-30 ENCOUNTER — Other Ambulatory Visit: Payer: Self-pay

## 2019-04-30 VITALS — BP 132/76 | HR 72 | Ht 66.5 in | Wt 172.0 lb

## 2019-04-30 DIAGNOSIS — G20 Parkinson's disease: Secondary | ICD-10-CM | POA: Diagnosis not present

## 2019-04-30 NOTE — Patient Instructions (Signed)
Increase carbidopa/levodopa 25/100, 2 tablets at 7am/1 at 11am, 1 at 4 pm  The physicians and staff at [facility] are committed to providing excellent care. You may receive a survey requesting feedback about your experience at our office. We strive to receive "very good" responses to the survey questions. If you feel that your experience would prevent you from giving the office a "very good " response, please contact our office to try to remedy the situation. We may be reached at [phone]. Thank you for taking the time out of your busy day to complete the survey.

## 2019-05-01 DIAGNOSIS — M25551 Pain in right hip: Secondary | ICD-10-CM | POA: Diagnosis not present

## 2019-05-01 DIAGNOSIS — M47817 Spondylosis without myelopathy or radiculopathy, lumbosacral region: Secondary | ICD-10-CM | POA: Diagnosis not present

## 2019-05-07 ENCOUNTER — Other Ambulatory Visit: Payer: Self-pay | Admitting: Neurology

## 2019-05-07 NOTE — Telephone Encounter (Signed)
Requested Prescriptions   Pending Prescriptions Disp Refills  . carbidopa-levodopa (SINEMET IR) 25-100 MG tablet [Pharmacy Med Name: CARBIDOPA-LEVODOPA 25-100 TAB] 270 tablet 1    Sig: TAKE 1 TABLET BY MOUTH THREE TIMES A DAY   Rx last filled:11/02/18 #270 1 refills  Pt last seen: 04/30/19  Follow up appt scheduled:10/08/19

## 2019-05-22 DIAGNOSIS — M5416 Radiculopathy, lumbar region: Secondary | ICD-10-CM | POA: Diagnosis not present

## 2019-06-06 DIAGNOSIS — M5416 Radiculopathy, lumbar region: Secondary | ICD-10-CM | POA: Diagnosis not present

## 2019-06-12 DIAGNOSIS — M1711 Unilateral primary osteoarthritis, right knee: Secondary | ICD-10-CM | POA: Diagnosis not present

## 2019-06-12 DIAGNOSIS — M25561 Pain in right knee: Secondary | ICD-10-CM | POA: Diagnosis not present

## 2019-06-18 DIAGNOSIS — R69 Illness, unspecified: Secondary | ICD-10-CM | POA: Diagnosis not present

## 2019-07-02 ENCOUNTER — Other Ambulatory Visit: Payer: Self-pay

## 2019-07-02 MED ORDER — OMEPRAZOLE 40 MG PO CPDR: 40.0000 mg | DELAYED_RELEASE_CAPSULE | Freq: Two times a day (BID) | ORAL | 0 refills | Status: DC

## 2019-07-03 ENCOUNTER — Other Ambulatory Visit: Payer: Self-pay | Admitting: Neurology

## 2019-07-03 DIAGNOSIS — M5136 Other intervertebral disc degeneration, lumbar region: Secondary | ICD-10-CM | POA: Diagnosis not present

## 2019-07-17 DIAGNOSIS — M25561 Pain in right knee: Secondary | ICD-10-CM | POA: Diagnosis not present

## 2019-07-20 DIAGNOSIS — M25361 Other instability, right knee: Secondary | ICD-10-CM | POA: Diagnosis not present

## 2019-07-20 DIAGNOSIS — M25561 Pain in right knee: Secondary | ICD-10-CM | POA: Diagnosis not present

## 2019-07-23 DIAGNOSIS — M7138 Other bursal cyst, other site: Secondary | ICD-10-CM | POA: Diagnosis not present

## 2019-07-23 DIAGNOSIS — M5136 Other intervertebral disc degeneration, lumbar region: Secondary | ICD-10-CM | POA: Diagnosis not present

## 2019-07-23 DIAGNOSIS — M545 Low back pain: Secondary | ICD-10-CM | POA: Diagnosis not present

## 2019-07-23 DIAGNOSIS — I10 Essential (primary) hypertension: Secondary | ICD-10-CM | POA: Insufficient documentation

## 2019-08-17 DIAGNOSIS — M1711 Unilateral primary osteoarthritis, right knee: Secondary | ICD-10-CM | POA: Diagnosis not present

## 2019-08-17 DIAGNOSIS — M25561 Pain in right knee: Secondary | ICD-10-CM | POA: Diagnosis not present

## 2019-08-21 DIAGNOSIS — R69 Illness, unspecified: Secondary | ICD-10-CM | POA: Diagnosis not present

## 2019-08-31 DIAGNOSIS — M25561 Pain in right knee: Secondary | ICD-10-CM | POA: Diagnosis not present

## 2019-09-03 DIAGNOSIS — M25561 Pain in right knee: Secondary | ICD-10-CM | POA: Diagnosis not present

## 2019-09-11 DIAGNOSIS — M25561 Pain in right knee: Secondary | ICD-10-CM | POA: Diagnosis not present

## 2019-10-01 ENCOUNTER — Other Ambulatory Visit: Payer: Self-pay | Admitting: Neurology

## 2019-10-01 DIAGNOSIS — M25561 Pain in right knee: Secondary | ICD-10-CM | POA: Diagnosis not present

## 2019-10-02 NOTE — Progress Notes (Signed)
Virtual Visit via Video Note The purpose of this virtual visit is to provide medical care while limiting exposure to the novel coronavirus.    Consent was obtained for video visit:  Yes.   Answered questions that patient had about telehealth interaction:  Yes.   I discussed the limitations, risks, security and privacy concerns of performing an evaluation and management service by telemedicine. I also discussed with the patient that there may be a patient responsible charge related to this service. The patient expressed understanding and agreed to proceed.  Pt location: Home Physician Location: office Name of referring provider:  Plotnikov, Evie Lacks, MD I connected with William [name] at patients initiation/request on 10/08/2019 at  8:45 AM EST by video enabled telemedicine application and verified that I am speaking with the correct person using two identifiers. Pt MRN:  [id number] Pt DOB:  [birth year] Video Participants:  William [name];  Daughter and wife present and supplements the history   History of Present Illness:  Patient seen today in follow-up for Parkinson's disease.  Levodopa was increased last visit.   One fall but he tripped over wires and fell.    Pt denies lightheadedness, near syncope.  No hallucinations.  Mood has been good.  Some word finding trouble.  He has been busy at work and managing the tasks.  Medical records are reviewed since last visit.  He has been following with Dr. Delilah Shan for the right knee pain.  Knee strength is better but gets stiff the longer he sits.  Exercise has been limited because of this.  He is just starting to get back to treadmill with PT.    Current movement d/o meds:  Carbidopa/levodopa 25/100, 2 tablets/1/1 neupro 85m  Prior medication: Pramipexole (diarrhea, but does have a history of ulcerative colitis and that is not unusual for him)   Current Outpatient Medications on File Prior to Visit  Medication Sig Dispense Refill  .  carbidopa-levodopa (SINEMET IR) 25-100 MG tablet TAKE 2 IN THE AM, 1 IN THE AFTERNOON AND EVENING 270 tablet 1  . ferrous sulfate 325 (65 FE) MG tablet Take 325 mg by mouth daily with breakfast.      . omeprazole (PRILOSEC) 40 MG capsule Take 1 capsule (40 mg total) by mouth 2 (two) times daily. Patient needs office visit for further refills 180 capsule 0  . psyllium (METAMUCIL) 58.6 % powder Take 1 packet by mouth 2 (two) times daily. 1 TBS Twice Daily.    . Sildenafil Citrate (VIAGRA PO) Take by mouth. Reported on 11/12/2015    . tricitrates (POLYCITRA-LC) [phone]/5ML SOLN TAKE 30 MLS BEFORE MEALS     No current facility-administered medications on file prior to visit.     Observations/Objective:   There were no vitals filed for this visit. GEN:  The patient appears stated age and is in NAD.  Neurological examination:  Orientation: The patient is alert and oriented x3. Cranial nerves: There is good facial symmetry. There is mild facial hypomimia.  The speech is fluent and clear. Soft palate rises symmetrically and there is no tongue deviation. Hearing is intact to conversational tone. Motor: Strength is at least antigravity x 4.   Shoulder shrug is equal and symmetric.  There is no pronator drift.  Movement examination: Tone: unable Abnormal movements: none Coordination:  There is  decremation with RAM's, with any form of RAMS, including alternating supination and pronation of the forearm, hand opening and closing, finger taps on the righ Gait  and Station: The patient has no difficulty arising out of a deep-seated chair without the use of the hands. The patient's stride length is good with mild dec arm swing on the right.    I have reviewed and interpreted the following labs independently   Chemistry      Component Value Date/Time   NA 133 (L) 06/27/2018 0915   K 4.4 06/27/2018 0915   CL 102 06/27/2018 0915   CO2 23 06/27/2018 0915   BUN 21 06/27/2018 0915   CREATININE 1.07  06/27/2018 0915      Component Value Date/Time   CALCIUM 9.4 06/27/2018 0915   ALKPHOS 37 (L) 05/27/2017 1138   AST 17 06/27/2018 0915   ALT 18 06/27/2018 0915   BILITOT 0.5 06/27/2018 0915     Lab Results  Component Value Date   TSH 2.40 11/28/2018     Assessment and Plan:   1.  Parkinson's disease  -Continue carbidopa/levodopa 25/100, 2/1/1  -continue neupro, 4 mg daily.  Refilled today  -discussed safe, CV exercise, including online programs  -recommend chem, cbc 2.  Trigeminal neuralgia  -Off meds and pain-free  -Per surgical opinion from Dr. Christella Noa and decompression was offered given AICA crossing her left trigeminal nerve root 3.  Memory change  -Neurocognitive testing in January, 2019 demonstrated MCI.  No evidence of dementia  -noting some word finding difficulties.  Discussed common with Parkinsons Disease but don't think he has PDD  Follow Up Instructions:  6 months  -I discussed the assessment and treatment plan with the patient. The patient was provided an opportunity to ask questions and all were answered. The patient agreed with the plan and demonstrated an understanding of the instructions.   The patient was advised to call back or seek an in-person evaluation if the symptoms worsen or if the condition fails to improve as anticipated.     Alonza Bogus, DO

## 2019-10-05 DIAGNOSIS — M25561 Pain in right knee: Secondary | ICD-10-CM | POA: Diagnosis not present

## 2019-10-08 ENCOUNTER — Other Ambulatory Visit: Payer: Self-pay

## 2019-10-08 ENCOUNTER — Telehealth (INDEPENDENT_AMBULATORY_CARE_PROVIDER_SITE_OTHER): Payer: Medicare HMO | Admitting: Neurology

## 2019-10-08 ENCOUNTER — Encounter: Payer: Self-pay | Admitting: Neurology

## 2019-10-08 ENCOUNTER — Telehealth: Payer: Self-pay

## 2019-10-08 DIAGNOSIS — G20 Parkinson's disease: Secondary | ICD-10-CM | POA: Diagnosis not present

## 2019-10-08 MED ORDER — NEUPRO 4 MG/24HR TD PT24: MEDICATED_PATCH | TRANSDERMAL | 5 refills | Status: DC

## 2019-10-08 NOTE — Telephone Encounter (Signed)
-----   Message from [place], DO sent at 10/08/2019  9:08 AM EST ----- Needs chem, cbc.  Dx:  Parkinsons Disease, medication monitoring

## 2019-10-12 DIAGNOSIS — M25561 Pain in right knee: Secondary | ICD-10-CM | POA: Diagnosis not present

## 2019-10-15 DIAGNOSIS — M25561 Pain in right knee: Secondary | ICD-10-CM | POA: Diagnosis not present

## 2019-10-17 DIAGNOSIS — N200 Calculus of kidney: Secondary | ICD-10-CM | POA: Diagnosis not present

## 2019-10-17 DIAGNOSIS — N486 Induration penis plastica: Secondary | ICD-10-CM | POA: Diagnosis not present

## 2019-10-18 DIAGNOSIS — M25561 Pain in right knee: Secondary | ICD-10-CM | POA: Diagnosis not present

## 2019-10-19 ENCOUNTER — Ambulatory Visit: Payer: Medicare HMO | Admitting: Podiatry

## 2019-10-19 ENCOUNTER — Other Ambulatory Visit: Payer: Self-pay

## 2019-10-19 ENCOUNTER — Encounter: Payer: Self-pay | Admitting: Podiatry

## 2019-10-19 VITALS — BP 140/80 | HR 60 | Temp 97.4°F

## 2019-10-19 DIAGNOSIS — L600 Ingrowing nail: Secondary | ICD-10-CM | POA: Diagnosis not present

## 2019-10-19 NOTE — Progress Notes (Signed)
Subjective:   Patient ID: William [name], male   DOB: 69 y.o.   MRN: [id number]   HPI Patient presents stating that this ingrown toenail has really been bothering me for a long time and that he has tried to trim it and soak it without relief and he would like to get it fixed and also that he has had a little bit of discomfort in the outside of the left fifth digit and was concerned about trauma.  Patient does not smoke likes to be active   Review of Systems  All other systems reviewed and are negative.       Objective:  Physical Exam Vitals and nursing note reviewed.  Constitutional:      Appearance: He is well-developed.  Pulmonary:     Effort: Pulmonary effort is normal.  Musculoskeletal:        General: Normal range of motion.  Skin:    General: Skin is warm.  Neurological:     Mental Status: He is alert.     Neurovascular status intact muscle strength found to be adequate range of motion within normal limits.  Patient is noted to have an incurvated lateral border of the right hallux that is painful when pressed with no active drainage or other pathology and some swelling of the left fifth digit localized.  The nail border is sore and does have obvious nail damage associated with that and patient does not smoke and patient does have good digital perfusion well oriented x3     Assessment:  Ingrown toenail deformity right hallux lateral border with pain along with digital deformity fifth digit left     Plan:  H&P reviewed conditions and I do recommend correction of the nail deformity.  I explained procedure risk and today patient wants procedure signed consent form and I infiltrated the right hallux 60 mg Xylocaine Marcaine mixture I remove the lateral border I exposed matrix and applied phenol 3 applications 30 seconds followed by alcohol lavage and sterile dressing.  Gave instructions for soaks and drop usage and encouraged to call with questions and leave dressing on 24 hours  but take it off earlier if any throbbing were to occur

## 2019-10-19 NOTE — Patient Instructions (Signed)

## 2019-10-26 DIAGNOSIS — M25561 Pain in right knee: Secondary | ICD-10-CM | POA: Diagnosis not present

## 2019-10-29 DIAGNOSIS — M1711 Unilateral primary osteoarthritis, right knee: Secondary | ICD-10-CM | POA: Diagnosis not present

## 2019-10-29 DIAGNOSIS — M25561 Pain in right knee: Secondary | ICD-10-CM | POA: Diagnosis not present

## 2019-11-02 ENCOUNTER — Other Ambulatory Visit: Payer: Self-pay | Admitting: Internal Medicine

## 2019-11-20 ENCOUNTER — Ambulatory Visit: Payer: Medicare HMO | Admitting: Internal Medicine

## 2019-11-20 ENCOUNTER — Encounter: Payer: Self-pay | Admitting: Internal Medicine

## 2019-11-20 VITALS — BP 118/60 | HR 72 | Temp 96.8°F | Ht 67.0 in | Wt 178.0 lb

## 2019-11-20 DIAGNOSIS — K624 Stenosis of anus and rectum: Secondary | ICD-10-CM

## 2019-11-20 DIAGNOSIS — K219 Gastro-esophageal reflux disease without esophagitis: Secondary | ICD-10-CM | POA: Diagnosis not present

## 2019-11-20 DIAGNOSIS — D500 Iron deficiency anemia secondary to blood loss (chronic): Secondary | ICD-10-CM | POA: Diagnosis not present

## 2019-11-20 MED ORDER — OMEPRAZOLE 40 MG PO CPDR: 40.0000 mg | DELAYED_RELEASE_CAPSULE | Freq: Two times a day (BID) | ORAL | 3 refills | Status: DC

## 2019-11-20 NOTE — Progress Notes (Signed)
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  The patient presents today for routine follow-up and management of his chronic GI conditions.  He also has a history of Parkinson's disease.  The patient was last seen in the office July 05, 2017.  See that dictation for details.  For his erosive esophagitis he is maintained on omeprazole 20 mg twice daily.  For his bowels he continues on Metamucil daily.  He has been on iron supplement.  He tells me that he is doing well.  No active reflux symptoms.  No dysphagia.  No significant problems with constipation or anything to suggest recurrent bowel obstruction.  His last anastomotic dilation was March 2016.  He sees Dr. Carles Collet for his Parkinson's disease.  REVIEW OF SYSTEMS:  All non-GI ROS negative unless otherwise stated in the HPI except for tremor  Past Medical History:  Diagnosis Date  . Allergic rhinitis   . Anemia, iron deficiency   . Arthritis   . Blood transfusion without reported diagnosis   . Colonic stricture ([place])   . ED (erectile dysfunction)   . Esophageal stricture    Status post upper endoscopy with esophageal dilatation  . Gastric polyps   . GERD (gastroesophageal reflux disease)   . Hiatal hernia   . Hypertension   . Hyponatremia   . Inguinal hernia   . Kidney stone    x 2  . Parkinson disease ([place])   . Ulcerative colitis    status post total abdominal colectomy with ileoanal anastomosis    Past Surgical History:  Procedure Laterality Date  . COLECTOMY  1992   with ilial anal pull through (for ulcerative colitis)  . ETHMOIDECTOMY     and maxillary enterostomies '04, redo in 12/04  .  INGUINAL HERNIA REPAIR    . KIDNEY STONE SURGERY     removed 2013  . KNEE ARTHROSCOPY Right 2004  . LASIK      Social History William [name]  reports that he quit smoking about 40 years ago. He has never used smokeless tobacco. He reports current alcohol use. He reports that he does not use drugs.  family history includes Cancer in his father; Dementia in his mother; Heart disease in his brother; Hypertension in his mother; Rectal cancer in his father.  No Known Allergies     PHYSICAL EXAMINATION: Vital signs: BP 118/60   Pulse 72   Temp (!) 96.8 F (36 C)   Ht 5' 7"  (1.702 m)   Wt 178 lb (80.7 kg)   BMI 27.88 kg/m   Constitutional: generally well-appearing, no acute distress Psychiatric: alert and oriented x3, cooperative Eyes: extraocular movements intact, anicteric, conjunctiva pink Mouth: oral pharynx moist, no lesions Neck: supple no lymphadenopathy Cardiovascular: heart regular rate and rhythm, no murmur Lungs: clear to auscultation bilaterally Abdomen: soft, nontender, nondistended, no obvious ascites, no peritoneal signs, normal bowel sounds, no organomegaly.  Previous surgical incision well-healed Rectal: Omitted Extremities: no clubbing, cyanosis, or lower extremity edema bilaterally Skin: no lesions on visible extremities Neuro: No focal deficits.  Cranial nerves intact  ASSESSMENT:  1.  GERD with erosive esophagitis.  Peptic stricture.  Asymptomatic on PPI 2.  Iron deficiency anemia secondary to the same.  On iron replacement  3.  Remote history of ulcerative colitis status post total abdominal colectomy with ileoanal anastomosis 4.  History of anastomotic stricture requiring pneumatic dilation 5.  Chronic constipation.  Improved with fiber 6.  General medical problems   PLAN:  1.  Continue omeprazole 40 mg twice daily.  Prescription refilled 2.  Continue daily Metamucil and water 3.  Continue iron supplementation 4.  Ongoing general medical care with  Dr. Alain Marion 5.  Ongoing neurologic care with Dr. Carles Collet 6.  Routine GI office follow-up 2 years.  Sooner if needed A total time of 30 minutes was spent preparing to see the patient.  Reviewing test, obtaining history, performing medically appropriate physical examination, counseling the patient regarding his GI issues, ordering medications and follow-up, and documenting clinical information in the health record

## 2019-11-20 NOTE — Patient Instructions (Signed)
We have sent the following medications to your pharmacy for you to pick up at your convenience:  Omeprazole  Please follow up in 2 years  

## 2019-11-26 DIAGNOSIS — M25561 Pain in right knee: Secondary | ICD-10-CM | POA: Diagnosis not present

## 2019-12-03 DIAGNOSIS — M25561 Pain in right knee: Secondary | ICD-10-CM | POA: Diagnosis not present

## 2019-12-03 DIAGNOSIS — M1711 Unilateral primary osteoarthritis, right knee: Secondary | ICD-10-CM | POA: Diagnosis not present

## 2019-12-06 ENCOUNTER — Ambulatory Visit (INDEPENDENT_AMBULATORY_CARE_PROVIDER_SITE_OTHER): Payer: Medicare HMO | Admitting: Internal Medicine

## 2019-12-06 DIAGNOSIS — R05 Cough: Secondary | ICD-10-CM

## 2019-12-06 DIAGNOSIS — I10 Essential (primary) hypertension: Secondary | ICD-10-CM

## 2019-12-06 DIAGNOSIS — K219 Gastro-esophageal reflux disease without esophagitis: Secondary | ICD-10-CM

## 2019-12-06 DIAGNOSIS — R059 Cough, unspecified: Secondary | ICD-10-CM

## 2019-12-06 MED ORDER — AZITHROMYCIN 250 MG PO TABS: ORAL_TABLET | ORAL | 1 refills | Status: DC

## 2019-12-06 MED ORDER — GUAIFENESIN-DM 100-10 MG/5ML PO SYRP: 5.0000 mL | ORAL_SOLUTION | ORAL | 1 refills | Status: DC | PRN

## 2019-12-09 ENCOUNTER — Encounter: Payer: Self-pay | Admitting: Internal Medicine

## 2019-12-09 DIAGNOSIS — R059 Cough, unspecified: Secondary | ICD-10-CM | POA: Insufficient documentation

## 2019-12-09 DIAGNOSIS — R05 Cough: Secondary | ICD-10-CM | POA: Insufficient documentation

## 2019-12-09 NOTE — Assessment & Plan Note (Signed)
Encouraged to f/u bp at home and next visit

## 2019-12-09 NOTE — Progress Notes (Signed)
Patient ID: William [name], male   DOB: 1951/01/20, 69 y.o.   MRN: [id number]  Virtual Visit via Video Note  I connected with [name] on Dec 06, 2019 at 10:00 AM EDT by a video enabled telemedicine application and verified that I am speaking with the correct person using two identifiers.  Location: Patient: at home Provider: at office   I discussed the limitations of evaluation and management by telemedicine and the availability of in person appointments. The patient expressed understanding and agreed to proceed.  History of Present Illness: Here with acute onset mild to mod 2-3 days ST, HA, general weakness and malaise, with prod cough greenish sputum, but Pt denies chest pain, increased sob or doe, wheezing, orthopnea, PND, increased LE swelling, palpitations, dizziness or syncope.  Pt denies new neurological symptoms such as new headache, or facial or extremity weakness or numbness   Pt denies polydipsia, polyuria,  Denies worsening reflux, abd pain, dysphagia, n/v, bowel change or blood.  Past Medical History:  Diagnosis Date  . Allergic rhinitis   . Anemia, iron deficiency   . Arthritis   . Blood transfusion without reported diagnosis   . Colonic stricture ([place])   . ED (erectile dysfunction)   . Esophageal stricture    Status post upper endoscopy with esophageal dilatation  . Gastric polyps   . GERD (gastroesophageal reflux disease)   . Hiatal hernia   . Hypertension   . Hyponatremia   . Inguinal hernia   . Kidney stone    x 2  . Parkinson disease ([place])   . Ulcerative colitis    status post total abdominal colectomy with ileoanal anastomosis   Past Surgical History:  Procedure Laterality Date  . COLECTOMY  1992   with ilial anal pull through (for ulcerative colitis)  . ETHMOIDECTOMY     and maxillary enterostomies '04, redo in 12/04  . INGUINAL HERNIA REPAIR    . KIDNEY STONE SURGERY     removed 2013  . KNEE ARTHROSCOPY Right 2004  . LASIK      reports that  he quit smoking about 40 years ago. He has never used smokeless tobacco. He reports current alcohol use. He reports that he does not use drugs. family history includes Cancer in his father; Dementia in his mother; Heart disease in his brother; Hypertension in his mother; Rectal cancer in his father. No Known Allergies Current Outpatient Medications on File Prior to Visit  Medication Sig Dispense Refill  . carbidopa-levodopa (SINEMET IR) 25-100 MG tablet TAKE 2 IN THE AM, 1 IN THE AFTERNOON AND EVENING 270 tablet 1  . ferrous sulfate 325 (65 FE) MG tablet Take 325 mg by mouth daily with breakfast.      . omeprazole (PRILOSEC) 40 MG capsule Take 1 capsule (40 mg total) by mouth 2 (two) times daily. 180 capsule 3  . psyllium (METAMUCIL) 58.6 % powder Take 1 packet by mouth 2 (two) times daily. 1 TBS Twice Daily.    . rotigotine (NEUPRO) 4 MG/24HR APPLY 1 PATCH ONTO THE SKIN EVERY DAY 30 patch 5  . Sildenafil Citrate (VIAGRA PO) Take by mouth. Reported on 11/12/2015    . tricitrates (POLYCITRA-LC) [phone] MG/5ML SOLN TAKE 30 MLS BEFORE MEALS     No current facility-administered medications on file prior to visit.    Observations/Objective: Alert, NAD, appropriate mood and affect, resps normal, cn 2-12 intact, moves all 4s, no visible rash or swelling Lab Results  Component Value Date  WBC 3.8 11/28/2018   HGB 12.5 (L) 11/28/2018   HCT 36.7 (L) 11/28/2018   PLT 211 11/28/2018   GLUCOSE 97 06/27/2018   CHOL 157 04/06/2017   TRIG 55.0 04/06/2017   HDL 69.70 04/06/2017   LDLCALC 76 04/06/2017   ALT 18 06/27/2018   AST 17 06/27/2018   NA 133 (L) 06/27/2018   K 4.4 06/27/2018   CL 102 06/27/2018   CREATININE 1.07 06/27/2018   BUN 21 06/27/2018   CO2 23 06/27/2018   TSH 2.40 11/28/2018   PSA 0.98 04/06/2017   Assessment and Plan: See notes  Follow Up Instructions: See notes   I discussed the assessment and treatment plan with the patient. The patient was provided an opportunity  to ask questions and all were answered. The patient agreed with the plan and demonstrated an understanding of the instructions.   The patient was advised to call back or seek an in-person evaluation if the symptoms worsen or if the condition fails to improve as anticipated.   Cathlean Cower, MD

## 2019-12-09 NOTE — Assessment & Plan Note (Signed)
stable overall by history and exam, recent data reviewed with pt, and pt to continue medical treatment as before,  to f/u any worsening symptoms or concerns  

## 2019-12-09 NOTE — Patient Instructions (Signed)
Please take all new medication as prescribed 

## 2019-12-09 NOTE — Assessment & Plan Note (Addendum)
Mild to mod, c/w bronchitis vs pna, for antibx course, cough med pn  to f/u any worsening symptoms or concerns  I spent 31 minutes in preparing to see the patient by review of recent labs, imaging and procedures, obtaining and reviewing separately obtained history, communicating with the patient and family or caregiver, ordering medications, tests or procedures, and documenting clinical information in the EHR including the differential Dx, treatment, and any further evaluation and other management of cough, htn, gerd

## 2019-12-31 DIAGNOSIS — M1990 Unspecified osteoarthritis, unspecified site: Secondary | ICD-10-CM | POA: Diagnosis not present

## 2019-12-31 DIAGNOSIS — K219 Gastro-esophageal reflux disease without esophagitis: Secondary | ICD-10-CM | POA: Diagnosis not present

## 2019-12-31 DIAGNOSIS — Z809 Family history of malignant neoplasm, unspecified: Secondary | ICD-10-CM | POA: Diagnosis not present

## 2019-12-31 DIAGNOSIS — Z823 Family history of stroke: Secondary | ICD-10-CM | POA: Diagnosis not present

## 2019-12-31 DIAGNOSIS — G20 Parkinson's disease: Secondary | ICD-10-CM | POA: Diagnosis not present

## 2019-12-31 DIAGNOSIS — Z800 Family history of malignant neoplasm of digestive organs: Secondary | ICD-10-CM | POA: Diagnosis not present

## 2019-12-31 DIAGNOSIS — N529 Male erectile dysfunction, unspecified: Secondary | ICD-10-CM | POA: Diagnosis not present

## 2019-12-31 DIAGNOSIS — Z791 Long term (current) use of non-steroidal anti-inflammatories (NSAID): Secondary | ICD-10-CM | POA: Diagnosis not present

## 2019-12-31 DIAGNOSIS — E663 Overweight: Secondary | ICD-10-CM | POA: Diagnosis not present

## 2019-12-31 DIAGNOSIS — G8929 Other chronic pain: Secondary | ICD-10-CM | POA: Diagnosis not present

## 2020-01-29 DIAGNOSIS — L570 Actinic keratosis: Secondary | ICD-10-CM | POA: Diagnosis not present

## 2020-01-29 DIAGNOSIS — X32XXXD Exposure to sunlight, subsequent encounter: Secondary | ICD-10-CM | POA: Diagnosis not present

## 2020-02-13 ENCOUNTER — Other Ambulatory Visit: Payer: Self-pay | Admitting: Neurology

## 2020-02-13 NOTE — Telephone Encounter (Signed)
Rx(s) sent to pharmacy electronically.  

## 2020-02-18 DIAGNOSIS — M545 Low back pain: Secondary | ICD-10-CM | POA: Diagnosis not present

## 2020-02-18 DIAGNOSIS — M9904 Segmental and somatic dysfunction of sacral region: Secondary | ICD-10-CM | POA: Diagnosis not present

## 2020-02-25 DIAGNOSIS — R69 Illness, unspecified: Secondary | ICD-10-CM | POA: Diagnosis not present

## 2020-02-28 DIAGNOSIS — M9904 Segmental and somatic dysfunction of sacral region: Secondary | ICD-10-CM | POA: Diagnosis not present

## 2020-02-28 DIAGNOSIS — M47817 Spondylosis without myelopathy or radiculopathy, lumbosacral region: Secondary | ICD-10-CM | POA: Diagnosis not present

## 2020-03-04 DIAGNOSIS — M9904 Segmental and somatic dysfunction of sacral region: Secondary | ICD-10-CM | POA: Diagnosis not present

## 2020-03-04 DIAGNOSIS — M47817 Spondylosis without myelopathy or radiculopathy, lumbosacral region: Secondary | ICD-10-CM | POA: Diagnosis not present

## 2020-03-06 DIAGNOSIS — M9904 Segmental and somatic dysfunction of sacral region: Secondary | ICD-10-CM | POA: Diagnosis not present

## 2020-03-06 DIAGNOSIS — M47817 Spondylosis without myelopathy or radiculopathy, lumbosacral region: Secondary | ICD-10-CM | POA: Diagnosis not present

## 2020-03-12 DIAGNOSIS — M9904 Segmental and somatic dysfunction of sacral region: Secondary | ICD-10-CM | POA: Diagnosis not present

## 2020-03-14 DIAGNOSIS — M9904 Segmental and somatic dysfunction of sacral region: Secondary | ICD-10-CM | POA: Diagnosis not present

## 2020-03-14 DIAGNOSIS — M47817 Spondylosis without myelopathy or radiculopathy, lumbosacral region: Secondary | ICD-10-CM | POA: Diagnosis not present

## 2020-03-18 DIAGNOSIS — M47817 Spondylosis without myelopathy or radiculopathy, lumbosacral region: Secondary | ICD-10-CM | POA: Diagnosis not present

## 2020-03-18 DIAGNOSIS — M9904 Segmental and somatic dysfunction of sacral region: Secondary | ICD-10-CM | POA: Diagnosis not present

## 2020-03-20 DIAGNOSIS — M9904 Segmental and somatic dysfunction of sacral region: Secondary | ICD-10-CM | POA: Diagnosis not present

## 2020-03-20 DIAGNOSIS — M47817 Spondylosis without myelopathy or radiculopathy, lumbosacral region: Secondary | ICD-10-CM | POA: Diagnosis not present

## 2020-03-25 DIAGNOSIS — M47817 Spondylosis without myelopathy or radiculopathy, lumbosacral region: Secondary | ICD-10-CM | POA: Diagnosis not present

## 2020-03-25 DIAGNOSIS — M9904 Segmental and somatic dysfunction of sacral region: Secondary | ICD-10-CM | POA: Diagnosis not present

## 2020-03-28 DIAGNOSIS — M47817 Spondylosis without myelopathy or radiculopathy, lumbosacral region: Secondary | ICD-10-CM | POA: Diagnosis not present

## 2020-03-28 DIAGNOSIS — M9904 Segmental and somatic dysfunction of sacral region: Secondary | ICD-10-CM | POA: Diagnosis not present

## 2020-04-01 DIAGNOSIS — R69 Illness, unspecified: Secondary | ICD-10-CM | POA: Diagnosis not present

## 2020-04-01 NOTE — Progress Notes (Signed)
Assessment/Plan:   1.  Parkinsons Disease  -Continue carbidopa/levodopa 25/100, 2/1/1  -Continue rotigotine patch, 4 mg daily.  Samples given.  Apply flonase to skin prior to using patch  -Needs lab work, as it has been over a year since it has been done.  We will do chemistry and CBC today. 2.  Remote history of trigeminal neuralgia  -Been pain-free for a long time.  Did have a surgical opinion from Dr. Christella Noa and decompression surgery was offered given AICA crossing left trigeminal nerve 3.  MCI  -Neurocognitive testing last in January, 2019.  No evidence of dementia at that time, and I do not think that that has changed since then. 4.  Dyskinesia, jaw, mild  -discussed amantadine and discussed dbs.   Wants to hold on both 5.  Follow up is anticipated in the next 4-6 months, sooner should new neurologic issues arise. Subjective:   William [name] was seen today in follow up for Parkinsons disease.  My previous records were reviewed prior to todays visit as well as outside records available to me.  Patient last saw primary care in March, 2021.  Those notes are reviewed.  Pt denies falls.  Noting site reaction with patches.  Speech is more slurred and softer.  Noting extra jaw movement as well.  Undergoing PT for back at [facility].   Pt denies lightheadedness, near syncope.  No hallucinations.  Mood has been good.  Current prescribed movement disorder medications: Carbidopa/levodopa 25/100, 2 tablets in the morning, 1 in the afternoon, 1 in the evening Rotigotine patch, 4 mg daily   ALLERGIES:  No Known Allergies  CURRENT MEDICATIONS:  Outpatient Encounter Medications as of 04/15/2020  Medication Sig  . carbidopa-levodopa (SINEMET IR) 25-100 MG tablet TAKE 2 IN THE AM, 1 IN THE AFTERNOON AND EVENING  . ferrous sulfate 325 (65 FE) MG tablet Take 325 mg by mouth daily with breakfast.    . omeprazole (PRILOSEC) 40 MG capsule Take 1 capsule (40 mg total) by mouth 2 (two) times daily.    . psyllium (METAMUCIL) 58.6 % powder Take 1 packet by mouth 2 (two) times daily. 1 TBS Twice Daily.  . rotigotine (NEUPRO) 4 MG/24HR APPLY 1 PATCH ONTO THE SKIN EVERY DAY  . Sildenafil Citrate (VIAGRA PO) Take by mouth. Reported on 11/12/2015  . [DISCONTINUED] azithromycin (ZITHROMAX) 250 MG tablet 2 tab by mouth day 1, then 1 per day (Patient not taking: Reported on 04/15/2020)  . [DISCONTINUED] guaiFENesin-dextromethorphan (ROBITUSSIN DM) 100-10 MG/5ML syrup Take 5 mLs by mouth every 4 (four) hours as needed for cough. (Patient not taking: Reported on 04/15/2020)  . [DISCONTINUED] tricitrates ([place]) [phone] MG/5ML SOLN TAKE 30 MLS BEFORE MEALS (Patient not taking: Reported on 04/15/2020)   No facility-administered encounter medications on file as of 04/15/2020.    Objective:   PHYSICAL EXAMINATION:    VITALS:   Vitals:   04/15/20 0812  BP: (!) 168/91  Pulse: (!) 57  SpO2: 99%  Weight: 177 lb (80.3 kg)  Height: 5' 7"  (1.702 m)    GEN:  The patient appears stated age and is in NAD. HEENT:  Normocephalic, atraumatic.  The mucous membranes are moist. The superficial temporal arteries are without ropiness or tenderness. CV:  Loletha Grayer.  regular Lungs:  CTAB Neck/HEME:  There are no carotid bruits bilaterally. Skin:  Mild patch site reaction on legs b/l  Neurological examination:  Orientation: The patient is alert and oriented x3. Cranial nerves: There is good facial  symmetry with facial hypomimia. The speech is fluent and clear. Soft palate rises symmetrically and there is no tongue deviation. Hearing is intact to conversational tone. Sensation: Sensation is intact to light touch throughout Motor: Strength is at least antigravity x4.  Movement examination: Tone: There is very mild increased tone in the LUE.  Otherwise nl Abnormal movements: mild jaw dyskinesia.   Coordination:  There is no decremation with RAM's Gait and Station: The patient has no difficulty arising out of a  deep-seated chair without the use of the hands. The patient's stride length is good and he is able to jog down the hall well.      Total time spent on today's visit was 40 minutes, including both face-to-face time and nonface-to-face time.  Time included that spent on review of records (prior notes available to me/labs/imaging if pertinent), discussing treatment and goals, answering patient's questions and coordinating care.  Cc:  Plotnikov, Evie Lacks, MD

## 2020-04-07 ENCOUNTER — Ambulatory Visit: Payer: Medicare HMO | Admitting: Neurology

## 2020-04-07 DIAGNOSIS — M9904 Segmental and somatic dysfunction of sacral region: Secondary | ICD-10-CM | POA: Diagnosis not present

## 2020-04-07 DIAGNOSIS — M47817 Spondylosis without myelopathy or radiculopathy, lumbosacral region: Secondary | ICD-10-CM | POA: Diagnosis not present

## 2020-04-14 DIAGNOSIS — M9904 Segmental and somatic dysfunction of sacral region: Secondary | ICD-10-CM | POA: Diagnosis not present

## 2020-04-14 DIAGNOSIS — M47817 Spondylosis without myelopathy or radiculopathy, lumbosacral region: Secondary | ICD-10-CM | POA: Diagnosis not present

## 2020-04-15 ENCOUNTER — Other Ambulatory Visit (INDEPENDENT_AMBULATORY_CARE_PROVIDER_SITE_OTHER): Payer: Medicare HMO

## 2020-04-15 ENCOUNTER — Encounter: Payer: Self-pay | Admitting: Neurology

## 2020-04-15 ENCOUNTER — Other Ambulatory Visit: Payer: Self-pay

## 2020-04-15 ENCOUNTER — Ambulatory Visit: Payer: Medicare HMO | Admitting: Neurology

## 2020-04-15 VITALS — BP 160/82 | HR 57 | Ht 67.0 in | Wt 177.0 lb

## 2020-04-15 DIAGNOSIS — G20 Parkinson's disease: Secondary | ICD-10-CM

## 2020-04-15 DIAGNOSIS — Z5181 Encounter for therapeutic drug level monitoring: Secondary | ICD-10-CM | POA: Diagnosis not present

## 2020-04-15 DIAGNOSIS — G249 Dystonia, unspecified: Secondary | ICD-10-CM

## 2020-04-15 LAB — COMPREHENSIVE METABOLIC PANEL
ALT: 17 U/L (ref 0–53)
AST: 24 U/L (ref 0–37)
Albumin: 4.4 g/dL (ref 3.5–5.2)
Alkaline Phosphatase: 37 U/L — ABNORMAL LOW (ref 39–117)
BUN: 24 mg/dL — ABNORMAL HIGH (ref 6–23)
CO2: 25 mEq/L (ref 19–32)
Calcium: 9.5 mg/dL (ref 8.4–10.5)
Chloride: 102 mEq/L (ref 96–112)
Creatinine, Ser: 1.13 mg/dL (ref 0.40–1.50)
GFR: 64.4 mL/min (ref >60.00)
Glucose, Bld: 104 mg/dL — ABNORMAL HIGH (ref 70–99)
Potassium: 4.4 mEq/L (ref 3.5–5.1)
Sodium: 131 mEq/L — ABNORMAL LOW (ref 135–145)
Total Bilirubin: 0.6 mg/dL (ref 0.2–1.2)
Total Protein: 6.9 g/dL (ref 6.0–8.3)

## 2020-04-15 LAB — CBC
HCT: 39.6 % (ref 39.0–52.0)
Hemoglobin: 13.5 g/dL (ref 13.0–17.0)
MCHC: 34.2 g/dL (ref 30.0–36.0)
MCV: 98.4 fl (ref 78.0–100.0)
Platelets: 185 10*3/uL (ref 150.0–400.0)
RBC: 4.02 Mil/uL — ABNORMAL LOW (ref 4.22–5.81)
RDW: 12.5 % (ref 11.5–15.5)
WBC: 3.4 10*3/uL — ABNORMAL LOW (ref 4.0–10.5)

## 2020-04-15 NOTE — Patient Instructions (Signed)
Your provider has requested that you have labwork completed today. Please go to [facility] ([id number]) on the second floor of this building before leaving the office today. You do not need to check in. If you are not called within 15 minutes please check with the front desk.   Try flonase - spray it on the skin prior to applying the patch and let it dry before putting on the patch.    The physicians and staff at [facility] are committed to providing excellent care. You may receive a survey requesting feedback about your experience at our office. We strive to receive "very good" responses to the survey questions. If you feel that your experience would prevent you from giving the office a "very good " response, please contact our office to try to remedy the situation. We may be reached at [phone]. Thank you for taking the time out of your busy day to complete the survey.

## 2020-04-23 DIAGNOSIS — M9904 Segmental and somatic dysfunction of sacral region: Secondary | ICD-10-CM | POA: Diagnosis not present

## 2020-05-01 DIAGNOSIS — M9904 Segmental and somatic dysfunction of sacral region: Secondary | ICD-10-CM | POA: Diagnosis not present

## 2020-05-01 DIAGNOSIS — M47817 Spondylosis without myelopathy or radiculopathy, lumbosacral region: Secondary | ICD-10-CM | POA: Diagnosis not present

## 2020-05-05 DIAGNOSIS — M47817 Spondylosis without myelopathy or radiculopathy, lumbosacral region: Secondary | ICD-10-CM | POA: Diagnosis not present

## 2020-05-05 DIAGNOSIS — M9904 Segmental and somatic dysfunction of sacral region: Secondary | ICD-10-CM | POA: Diagnosis not present

## 2020-05-09 DIAGNOSIS — M47817 Spondylosis without myelopathy or radiculopathy, lumbosacral region: Secondary | ICD-10-CM | POA: Diagnosis not present

## 2020-05-09 DIAGNOSIS — M9904 Segmental and somatic dysfunction of sacral region: Secondary | ICD-10-CM | POA: Diagnosis not present

## 2020-05-28 ENCOUNTER — Other Ambulatory Visit: Payer: Self-pay

## 2020-05-28 ENCOUNTER — Ambulatory Visit (INDEPENDENT_AMBULATORY_CARE_PROVIDER_SITE_OTHER): Payer: Medicare HMO

## 2020-05-28 DIAGNOSIS — N486 Induration penis plastica: Secondary | ICD-10-CM | POA: Diagnosis not present

## 2020-05-28 DIAGNOSIS — N5201 Erectile dysfunction due to arterial insufficiency: Secondary | ICD-10-CM | POA: Diagnosis not present

## 2020-05-28 DIAGNOSIS — Z23 Encounter for immunization: Secondary | ICD-10-CM | POA: Diagnosis not present

## 2020-05-28 NOTE — Progress Notes (Signed)
Flu shot given. Pt tolerated well.

## 2020-07-08 DIAGNOSIS — Z0100 Encounter for examination of eyes and vision without abnormal findings: Secondary | ICD-10-CM | POA: Diagnosis not present

## 2020-07-08 DIAGNOSIS — H524 Presbyopia: Secondary | ICD-10-CM | POA: Diagnosis not present

## 2020-07-17 DIAGNOSIS — M25561 Pain in right knee: Secondary | ICD-10-CM | POA: Diagnosis not present

## 2020-08-06 ENCOUNTER — Other Ambulatory Visit: Payer: Self-pay | Admitting: Neurology

## 2020-08-12 ENCOUNTER — Other Ambulatory Visit: Payer: Self-pay | Admitting: Neurology

## 2020-08-18 ENCOUNTER — Other Ambulatory Visit: Payer: Self-pay | Admitting: Neurology

## 2020-08-18 ENCOUNTER — Telehealth: Payer: Self-pay | Admitting: Neurology

## 2020-08-18 NOTE — Telephone Encounter (Signed)
Okay to do so? 

## 2020-08-18 NOTE — Telephone Encounter (Signed)
Absolutely so long as we have it

## 2020-08-18 NOTE — Telephone Encounter (Signed)
One sample given okay per Dr. Carles Collet

## 2020-08-18 NOTE — Telephone Encounter (Signed)
Patient states that he is out of his Neupro patch and the pharmacy can get it until Thursday so he wants to know if we have samples that he can have until he can get is medication  Please call

## 2020-08-26 ENCOUNTER — Telehealth: Payer: Self-pay | Admitting: Neurology

## 2020-08-26 ENCOUNTER — Other Ambulatory Visit: Payer: Self-pay | Admitting: Neurology

## 2020-08-26 NOTE — Telephone Encounter (Signed)
Patient notified directly that rx has been sent over to the pharmacy. He voiced understanding and thanked me for calling.

## 2020-08-26 NOTE — Telephone Encounter (Signed)
Patient states he is completely out of the Neupro medication he was given samples of. His pharmacy told him that we haven't sent in the refill for it yet. Patient is asking for refill and to be notified when it's sent in. Thanks!

## 2020-08-26 NOTE — Telephone Encounter (Signed)
Rx(s) sent to pharmacy electronically.  

## 2020-10-15 NOTE — Progress Notes (Signed)
Assessment/Plan:   1.  Parkinsons Disease  -Continue carbidopa/levodopa 25/100, 2 at 7 AM, one at 11 AM, one at 4 PM  -Doing well with rotigotine patch, 4 mg daily.  Apply Flonase to skin prior to flying patch.  -discussed in detail the importance of exercise and potentially hiring a trainer.  He is considering retiring at the end of the year 2.  Mild jaw dyskinesia  -Not bothersome to patient.  He doesn't want amantadine right now 3.  [facility]  -Last neurocognitive testing in 2019.  I do not think anything clinically has significantly changed since that time.   Subjective:   William [name] was seen today in follow up for Parkinsons disease.  My previous records were reviewed prior to todays visit as well as outside records available to me.  Wife with patient who supplements hx.  No wearing off of medication.  Some cramping of feet.   No compulsive behaviors.  No sleep attacks.  Pt denies falls.  Balance is little worse.  Little shuffling with ambulation.  Still working.  Pt denies lightheadedness, near syncope.  No hallucinations.  Mood has been good.  He has not been exercising until the last 5 days - "I had a wake up call reading an article."    Current prescribed movement disorder medications: Carbidopa/levodopa 25/100, 2 tablets in the morning, 1 in the afternoon, 1 in the evening Rotigotine patch, 4 mg daily   ALLERGIES:  No Known Allergies  CURRENT MEDICATIONS:  Outpatient Encounter Medications as of 10/17/2020  Medication Sig  . carbidopa-levodopa (SINEMET IR) 25-100 MG tablet TAKE 2 IN THE AM, 1 IN THE AFTERNOON AND EVENING  . ferrous sulfate 325 (65 FE) MG tablet Take 325 mg by mouth daily with breakfast.  . NEUPRO 4 MG/24HR APPLY 1 PATCH ONTO THE SKIN EVERY DAY  . omeprazole (PRILOSEC) 40 MG capsule Take 1 capsule (40 mg total) by mouth 2 (two) times daily.  . psyllium (METAMUCIL) 58.6 % powder Take 1 packet by mouth 2 (two) times daily. 1 TBS Twice Daily.  . Sildenafil  Citrate (VIAGRA PO) Take by mouth. Reported on 11/12/2015   No facility-administered encounter medications on file as of 10/17/2020.    Objective:   PHYSICAL EXAMINATION:    VITALS:   Vitals:   10/17/20 0903  BP: 110/62  Pulse: 68  Resp: 18  SpO2: 99%  Weight: 176 lb (79.8 kg)  Height: 5' 7"  (1.702 m)    GEN:  The patient appears stated age and is in NAD. HEENT:  Normocephalic, atraumatic.  The mucous membranes are moist. The superficial temporal arteries are without ropiness or tenderness. CV:  RRR Lungs:  CTAB Neck/HEME:  There are no carotid bruits bilaterally.  Neurological examination:  Orientation: The patient is alert and oriented x3. Cranial nerves: There is good facial symmetry with mild facial hypomimia. The speech is fluent and clear. Soft palate rises symmetrically and there is no tongue deviation. Hearing is intact to conversational tone. Sensation: Sensation is intact to light touch throughout Motor: Strength is at least antigravity x4.  Movement examination: Tone: There is nl tone in the UE/LE Abnormal movements: mild jaw dyskinesia.   Coordination:  There is mild decremation with RAM's with finger taps on the L only Gait and Station: The patient has mld difficulty arising out of a deep-seated chair without the use of the hands. The patient's stride length is good   I have reviewed and interpreted the following labs independently  Chemistry      Component Value Date/Time   NA 131 (L) 04/15/2020 0851   K 4.4 04/15/2020 0851   CL 102 04/15/2020 0851   CO2 25 04/15/2020 0851   BUN 24 (H) 04/15/2020 0851   CREATININE 1.13 04/15/2020 0851   CREATININE 1.07 06/27/2018 0915      Component Value Date/Time   CALCIUM 9.5 04/15/2020 0851   ALKPHOS 37 (L) 04/15/2020 0851   AST 24 04/15/2020 0851   ALT 17 04/15/2020 0851   BILITOT 0.6 04/15/2020 0851       Lab Results  Component Value Date   WBC 3.4 (L) 04/15/2020   HGB 13.5 04/15/2020   HCT 39.6  04/15/2020   MCV 98.4 04/15/2020   PLT 185.0 04/15/2020    Lab Results  Component Value Date   TSH 2.40 11/28/2018     Total time spent on today's visit was 30 minutes, including both face-to-face time and nonface-to-face time.  Time included that spent on review of records (prior notes available to me/labs/imaging if pertinent), discussing treatment and goals, answering patient's questions and coordinating care.  Cc:  Plotnikov, Evie Lacks, MD

## 2020-10-17 ENCOUNTER — Other Ambulatory Visit: Payer: Self-pay

## 2020-10-17 ENCOUNTER — Ambulatory Visit: Payer: Medicare HMO | Admitting: Neurology

## 2020-10-17 ENCOUNTER — Encounter: Payer: Self-pay | Admitting: Neurology

## 2020-10-17 VITALS — BP 110/62 | HR 68 | Resp 18 | Ht 67.0 in | Wt 176.0 lb

## 2020-10-17 DIAGNOSIS — G20 Parkinson's disease: Secondary | ICD-10-CM

## 2020-10-17 MED ORDER — CARBIDOPA-LEVODOPA 25-100 MG PO TABS: ORAL_TABLET | ORAL | 1 refills | Status: DC

## 2020-10-17 MED ORDER — NEUPRO 4 MG/24HR TD PT24: 1.0000 | MEDICATED_PATCH | Freq: Every day | TRANSDERMAL | 1 refills | Status: DC

## 2020-10-17 NOTE — Patient Instructions (Signed)
Online Resources for Power over Parkinson's Group January 2022  . Local [place] Online Groups  o Power over [employer] :   - Power Over Parkinson's Patient Education Group will be Wednesday, January 12th at 2pm via [place].   - Upcoming Power over Parkinson's Meetings:  2nd Wednesdays of the month at 2 pm:       February 9th, March 9th - Contact William [name] at [name] if interested in participating in this online group o Parkinson's Care Partners Group:    3rd Mondays, Contact William [name] o Atypical Parkinsonian Patient Group:   4th Wednesdays, Contact William [name] o If you are interested in participating in these online groups with William [name], please contact her directly for how to join those meetings.  Her contact information is [name].  She will send you a link to join the [employer].  (Please note that William [name] , MSW, LCSW, has resigned her position at [employer] [facility], but will continue to lead the online groups temporarily)  . [place]:  [email or web address] o PD Health at Home continues:  Mindfulness Mondays, Expert Briefing Tuesdays, Wellness Wednesdays, Take Time Thursdays, Fitness Fridays -Listings for January 2022 are on the website o Upcoming Webinar:  Sights, Sounds, and Parkinson's.  Wednesday, October 15, 2020, @ 1 pm  o Please check out their website to sign up for emails and see their full online offerings  . [place]:  [email or web address]  o Upcoming Webinar:   Diet, Exercise, and other Strategies for Living Well as you Age.  Thursday, October 04, 2020 @ 12 noon o Check out additional information on their website to see their full online offerings  . [place]:  [email or web address] o Upcoming Webinar:  Emerging Therapies in Parkinson's with Dr. Andy [name].  Wednesday, October 08, 2020 @ 2 pm o Care Partner Monthly Meetup.  With William [name].  First  Tuesday of each month, 2 pm o Check out additional information to Live Well Today on their website  . Parkinson and Movement Disorders (PMD) Alliance:  www.pmdalliance.org o [employer] Online:  Online Education Events o Sign up for emails, which are sent weekly to give you updates on programming and online offerings  . Parkinson's Association of the [place]:  www.parkinsonassociation.org o Information on online support groups, online exercises including Yoga, Parkinson's exercises and more-LOTS of information on links to PD resources and online events o Virtual Support Group through Parkinson's Association of the [place]; next one is scheduled for Wednesday, October 15, 2020 at 2 pm. (These are typically scheduled for the 1st Wednesday of the month at 2 pm).  Visit website for details.  . Additional links for movement activities: o PWR! Moves Classes at [place] RESUMED (but are ON HOLD DUE TO COVID in January)  Contact William [name], PT [name] or [phone] if interested o Here is a link to the PWR!Moves classes on Zoom from [place] - Daily Mon-Sat at 10:00. Via Zoom, FREE and open to all.  There is also a link below via Facebook if you use that platform. - [email or web address].si - [email or web address].no o Parkinson's Wellness Recovery (PWR! Moves)  www.pwr4life.org - Info on the PWR! Virtual Experience:  You will have access to our expertise through self-assessment, guided plans that start with the PD-specific fundamentals, educational content, tips, Q&A with an expert, and a growing Art therapist of PD-specific pre-recorded and live exercise classes of varying types and intensity - both physical and cognitive! If that is not  enough, we offer 1:1 wellness  consultations (in-person or virtual) to personalize your PWR! Research scientist (medical).  - Check out the PWR! Move of the month on the [place] [facility] website:  [email or web address] o William [name] Fridays:  - As part of the PD Health @ Home program, this free video series focuses each week on one aspect of fitness designed to support people living with Parkinson's.  -  [email or web address].dk o Dance for PD website is offering free, live-stream classes throughout the week, as well as links to [employer] of classes:  [email or web address] o Dance for Parkinson's Class:  [place].  Free offering for people with Parkinson's and care partners; virtual class.  o For more information, contact [phone] or email William [name] at [email or web address] o Virtual dance and Pilates for Parkinson's classes: Click on the Community Tab> Parkinson's Movement Initiative Tab.  To register for classes and for more information, visit www.[email or web address].za and click the "community" tab.  o [employer] Parkinson's Cycling Classes  - [facility] [employer]: 1pm on Fridays-Live classes at [facility] [employer] at [email or web address] or [phone]) [facility]: Virtual Classes Mondays and Thursdays (contact [place] at [email or web address] or [phone])   o [address] - Three levels of classes are offered Tuesdays and Thursdays:  10:30 am,  12 noon & 1:45 pm at [facility]. To observe a class or for  more information, call [phone] or email [email or web address] - PD Flow Yoga for Parkinson's:  Fridays 1-2 pm, January 7-November 07, 2020 . Well[employer]Spring [employer]: o Chief Technology Officer Opportunities:  www.well-springsolutions.org/caregiver-education/caregiver-support-group.  You  may also contact William [name] at [email or web address] or [phone].   o Wellspring Powerful Tools for Caregivers, 6 week series designed to provide family and caregivers with practical tools to care for themselves and loved ones.  Wednesdays 10:15-12:15, beginning January 12th - Contact January 25 (above) for details o Well-Spring Navigator:  Just1Navigator program, a free service to help individuals and families through the journey of determining care for older adults.  The "Navigator" is a William [name], Education officer, museum, who will speak with a prospective client and/or loved ones to provide an assessment of the situation and a set of recommendations for a personalized care plan - all free of charge, and whether [facility]Spring [employer] offers the needed service or not. If the need is not a service we provide, we are well-connected with reputable programs in town that we can refer you to.  www.well-springsolutions.org or to speak with the Navigator, call [phone].

## 2020-11-11 DIAGNOSIS — Z8616 Personal history of COVID-19: Secondary | ICD-10-CM

## 2020-11-11 HISTORY — DX: Personal history of COVID-19: Z86.16

## 2020-11-19 ENCOUNTER — Telehealth: Payer: Self-pay | Admitting: Internal Medicine

## 2020-11-19 NOTE — Telephone Encounter (Signed)
LVM for pt to rtn my call to schedule AWV with [facility]. Please schedule appt if pt calls the office.  

## 2020-11-24 ENCOUNTER — Telehealth: Payer: Self-pay | Admitting: Neurology

## 2020-11-24 NOTE — Telephone Encounter (Signed)
Patient called and said he Neupro 74m patch is on back order and he is past due. He'd like to know if Dr. TCarles Collethas a sample to offer him?

## 2020-11-24 NOTE — Telephone Encounter (Signed)
Checked our samples supply.   We have samples.   3 boxes of Neupro have been placed upfront for pick up and documented in the sample book. .   Patient notified and stated he will be at our office in a few moments.

## 2020-11-24 NOTE — Telephone Encounter (Signed)
If we have samples, please offer them to the patient

## 2020-12-04 DIAGNOSIS — R102 Pelvic and perineal pain: Secondary | ICD-10-CM | POA: Diagnosis not present

## 2020-12-05 ENCOUNTER — Other Ambulatory Visit: Payer: Self-pay

## 2020-12-05 ENCOUNTER — Ambulatory Visit (INDEPENDENT_AMBULATORY_CARE_PROVIDER_SITE_OTHER): Payer: Medicare HMO

## 2020-12-05 VITALS — BP 110/70 | HR 69 | Temp 98.3°F | Resp 16 | Ht 67.0 in | Wt 176.8 lb

## 2020-12-05 DIAGNOSIS — Z0000 Encounter for general adult medical examination without abnormal findings: Secondary | ICD-10-CM | POA: Diagnosis not present

## 2020-12-05 NOTE — Progress Notes (Signed)
Subjective:   William [name] is a 70 y.o. male who presents for Medicare Annual/Subsequent preventive examination.  Review of Systems    No ROS. Medicare Wellness Visit. Additional risk factors are reflected in social history. Cardiac Risk Factors include: advanced age (>60mn, >>32women);dyslipidemia;hypertension;male gender;family history of premature cardiovascular disease     Objective:    Today's Vitals   12/05/20 0907  BP: 110/70  Pulse: 69  Resp: 16  Temp: 98.3 F (36.8 C)  SpO2: 98%  Weight: 176 lb 12.8 oz (80.2 kg)  Height: 5' 7"  (1.702 m)  PainSc: 0-No pain   Body mass index is 27.69 kg/m.  Advanced Directives 12/05/2020 10/17/2020 04/15/2020 04/30/2019 11/28/2018 05/28/2017 11/13/2014  Does Patient Have a Medical Advance Directive? Yes Yes Yes Yes No Yes No  Type of AParamedicof [place]Living will - [place] will Healthcare Power of [place]-  Does patient want to make changes to medical advance directive? No - Patient declined - - - - No - Patient declined -  Copy of HHat [place]in Chart? No - copy requested - - - - - -    Current Medications (verified) Outpatient Encounter Medications as of 12/05/2020  Medication Sig  . carbidopa-levodopa (SINEMET IR) 25-100 MG tablet 2 at 7am/1 at 11am/1 at 4pm  . ferrous sulfate 325 (65 FE) MG tablet Take 325 mg by mouth daily with breakfast.  . omeprazole (PRILOSEC) 40 MG capsule Take 1 capsule (40 mg total) by mouth 2 (two) times daily.  . psyllium (METAMUCIL) 58.6 % powder Take 1 packet by mouth 2 (two) times daily. 1 TBS Twice Daily.  . rotigotine (NEUPRO) 4 MG/24HR Place 1 patch onto the skin daily.  . Sildenafil Citrate (VIAGRA PO) Take by mouth. Reported on 11/12/2015   No facility-administered encounter medications on file as of 12/05/2020.    Allergies (verified) Patient has no known allergies.   History: Past  Medical History:  Diagnosis Date  . Allergic rhinitis   . Anemia, iron deficiency   . Arthritis   . Blood transfusion without reported diagnosis   . Colonic stricture ([place]   . ED (erectile dysfunction)   . Esophageal stricture    Status post upper endoscopy with esophageal dilatation  . Gastric polyps   . GERD (gastroesophageal reflux disease)   . Hiatal hernia   . Hypertension   . Hyponatremia   . Inguinal hernia   . Kidney stone    x 2  . Parkinson disease ([place]   . Ulcerative colitis    status post total abdominal colectomy with ileoanal anastomosis   Past Surgical History:  Procedure Laterality Date  . COLECTOMY  1992   with ilial anal pull through (for ulcerative colitis)  . ETHMOIDECTOMY     and maxillary enterostomies '04, redo in 12/04  . INGUINAL HERNIA REPAIR    . KIDNEY STONE SURGERY     removed 2013  . KNEE ARTHROSCOPY Right 2004  . LASIK     Family History  Problem Relation Age of Onset  . Cancer Father        rectal cancer and MM  . Rectal cancer Father   . Dementia Mother   . Hypertension Mother   . Heart disease Brother        valve repaired at 64 . Colon cancer Neg Hx   . Esophageal cancer Neg Hx   . Stomach cancer  Neg Hx    Social History   Socioeconomic History  . Marital status: Married    Spouse name: Not on file  . Number of children: 0  . Years of education: Not on file  . Highest education level: Bachelor's degree (e.g., BA, AB, BS)  Occupational History  . Occupation: Public house manager: [employer]  Tobacco Use  . Smoking status: Former Smoker    Quit date: 09/14/1979    Years since quitting: 41.2  . Smokeless tobacco: Never Used  . Tobacco comment: smoked for 7 years  Vaping Use  . Vaping Use: Never used  Substance and Sexual Activity  . Alcohol use: Yes    Alcohol/week: 0.0 standard drinks    Comment: 2 glasses of wine per night  . Drug use: No  . Sexual activity: Yes  Other Topics Concern  . Not on  file  Social History Narrative   [employer] grad   Married 13.5 years- divorced; married '05   No children   Work: Insurance underwriter   Patient is a former smoker. Quit in 1981   Alcohol use- yes social   Daily Caffeine use 2 cups coffee per day   Illicit drug use- no   Right handed   Two story home   Social Determinants of Health   Financial Resource Strain: Low Risk   . Difficulty of Paying Living Expenses: Not hard at all  Food Insecurity: No Food Insecurity  . Worried About Charity fundraiser in the Last Year: Never true  . Ran Out of Food in the Last Year: Never true  Transportation Needs: No Transportation Needs  . Lack of Transportation (Medical): No  . Lack of Transportation (Non-Medical): No  Physical Activity: Sufficiently Active  . Days of Exercise per Week: 5 days  . Minutes of Exercise per Session: 30 min  Stress: No Stress Concern Present  . Feeling of Stress : Not at all  Social Connections: Not on file    Tobacco Counseling Counseling given: Not Answered Comment: smoked for 7 years   Clinical Intake:  Pre-visit preparation completed: Yes  Pain : No/denies pain Pain Score: 0-No pain     BMI - recorded: 27.69 Nutritional Status: BMI 25 -29 Overweight Nutritional Risks: None Diabetes: No  How often do you need to have someone help you when you read instructions, pamphlets, or other written materials from your doctor or pharmacy?: 1 - Never What is the last grade level you completed in school?: Graduated from [employer]  Diabetic? no  Interpreter Needed?: No  Information entered by :: Lisette Abu, LPN   Activities of Daily Living In your present state of health, do you have any difficulty performing the following activities: 12/05/2020  Hearing? N  Vision? N  Difficulty concentrating or making decisions? N  Walking or climbing stairs? N  Dressing or bathing? N  Doing errands, shopping? N  Preparing Food and eating ? N  Using the  Toilet? N  In the past six months, have you accidently leaked urine? N  Do you have problems with loss of bowel control? N  Managing your Medications? N  Managing your Finances? N  Housekeeping or managing your Housekeeping? N  Some recent data might be hidden    Patient Care Team: Plotnikov, Evie Lacks, MD as PCP - General (Internal Medicine) Troy Sine, MD as PCP - Cardiology (Cardiology) Irene Shipper, MD as Consulting Physician (Gastroenterology) Rivka Spring, MD (  Urology) Tat, Eustace Quail, DO as Consulting Physician (Neurology)  Indicate any recent Medical Services you may have received from other than [facility] providers in the past year (date may be approximate).     Assessment:   This is a routine wellness examination for Kurt.  Hearing/Vision screen No exam data present  Dietary issues and exercise activities discussed: Current Exercise Habits: Home exercise routine, Type of exercise: stretching;strength training/weights;walking, Time (Minutes): 30, Frequency (Times/Week): 5, Weekly Exercise (Minutes/Week): 150, Intensity: Moderate, Exercise limited by: neurologic condition(s)  Goals    . DIET - INCREASE WATER INTAKE     Continue with my current exercise regimen with walking and doing resistance training 4-5 days a week to control Parkinson's Disease.      Depression Screen PHQ 2/9 Scores 12/05/2020 06/07/2017 08/09/2016  PHQ - 2 Score 0 0 0    Fall Risk Fall Risk  12/05/2020 10/17/2020 04/15/2020 04/30/2019 11/28/2018  Falls in the past year? 0 0 1 0 1  Number falls in past yr: 0 0 0 0 0  Injury with Fall? 0 0 1 0 0  Risk for fall due to : No Fall Risks - - - History of fall(s)  Follow up Falls evaluation completed - - - Falls evaluation completed    FALL RISK PREVENTION PERTAINING TO THE HOME:  Any stairs in or around the home? Yes  If so, are there any without handrails? No  Home free of loose throw rugs in walkways, pet beds, electrical cords, etc? Yes   Adequate lighting in your home to reduce risk of falls? Yes   ASSISTIVE DEVICES UTILIZED TO PREVENT FALLS:  Life alert? No  Use of a cane, walker or w/c? No  Grab bars in the bathroom? No  Shower chair or bench in shower? Yes  Elevated toilet seat or a handicapped toilet? No   TIMED UP AND GO:  Was the test performed? No .  Length of time to ambulate 10 feet: 0 sec.   Gait steady and fast without use of assistive device  Cognitive Function: Normal cognitive status assessed by direct observation by this Nurse Health Advisor. No abnormalities found.   MMSE - Mini Mental State Exam 07/14/2017  Not completed: Refused        Immunizations Immunization History  Administered Date(s) Administered  . Fluad Quad(high Dose 65+) 05/28/2020  . Hepatitis A 03/20/2013  . Influenza Whole 06/17/2010, 09/22/2012  . Influenza, High Dose Seasonal PF 09/20/2018  . Influenza,inj,Quad PF,6+ Mos 06/18/2014, 08/09/2016, 09/03/2017  . Influenza-Unspecified 09/29/2015, 07/07/2017  . PFIZER(Purple Top)SARS-COV-2 Vaccination 10/27/2019, 11/21/2019, 07/09/2020  . Pneumococcal Conjugate-13 04/04/2017  . Pneumococcal Polysaccharide-23 08/15/2008  . Tdap 10/04/2012  . Yellow Fever 04/13/2013  . Zoster 05/28/2014    TDAP status: Up to date  Flu Vaccine status: Up to date  Pneumococcal vaccine status: Up to date  Covid-19 vaccine status: Completed vaccines  Qualifies for Shingles Vaccine? Yes   Zostavax completed Yes   Shingrix Completed?: No.    Education has been provided regarding the importance of this vaccine. Patient has been advised to call insurance company to determine out of pocket expense if they have not yet received this vaccine. Advised may also receive vaccine at local pharmacy or Health Dept. Verbalized acceptance and understanding.  Screening Tests Health Maintenance  Topic Date Due  . Hepatitis C Screening  Never done  . PNA vac Low Risk Adult (2 of 2 - PPSV23) 04/04/2018   . TETANUS/TDAP  10/04/2022  . INFLUENZA  VACCINE  Completed  . COVID-19 Vaccine  Completed  . HPV VACCINES  Aged Out  . COLONOSCOPY (Pts 45-40yr Insurance coverage will need to be confirmed)  Discontinued    Health Maintenance  Health Maintenance Due  Topic Date Due  . Hepatitis C Screening  Never done  . PNA vac Low Risk Adult (2 of 2 - PPSV23) 04/04/2018    Colorectal cancer screening: No longer required.   Lung Cancer Screening: (Low Dose CT Chest recommended if Age 70-80years, 30 pack-year currently smoking OR have quit w/in 15years.) does qualify.   Lung Cancer Screening Referral: no  Additional Screening:  Hepatitis C Screening: does qualify; Completed no  Vision Screening: Recommended annual ophthalmology exams for early detection of glaucoma and other disorders of the eye. Is the patient up to date with their annual eye exam?  Yes  Who is the provider or what is the name of the office in which the patient attends annual eye exams? [facility] If pt is not established with a provider, would they like to be referred to a provider to establish care? No .   Dental Screening: Recommended annual dental exams for proper oral hygiene  Community Resource Referral / Chronic Care Management: CRR required this visit?  No   CCM required this visit?  No      Plan:     I have personally reviewed and noted the following in the patient's chart:   . Medical and social history . Use of alcohol, tobacco or illicit drugs  . Current medications and supplements . Functional ability and status . Nutritional status . Physical activity . Advanced directives . List of other physicians . Hospitalizations, surgeries, and ER visits in previous 12 months . Vitals . Screenings to include cognitive, depression, and falls . Referrals and appointments  In addition, I have reviewed and discussed with patient certain preventive protocols, quality metrics, and best practice  recommendations. A written personalized care plan for preventive services as well as general preventive health recommendations were provided to patient.     SSheral Flow LPN   [id number]  Nurse Notes:  Medications reviewed with patient; no opioid use noted.

## 2020-12-05 NOTE — Patient Instructions (Signed)
William [name] , Thank you for taking time to come for your Medicare Wellness Visit. I appreciate your ongoing commitment to your health goals. Please review the following plan we discussed and let me know if I can assist you in the future.   Screening recommendations/referrals: Colonoscopy: discontinued per records Recommended yearly ophthalmology/optometry visit for glaucoma screening and checkup Recommended yearly dental visit for hygiene and checkup  Vaccinations: Influenza vaccine: 05/28/2020 Pneumococcal vaccine: 08/15/2008, 04/04/2017 Tdap vaccine: 10/04/2012 Shingles vaccine: never done   Covid-19: 10/27/2019, 11/21/2019, 07/09/2020  Advanced directives: Please bring a copy of your health care power of attorney and living will to the office at your convenience.  Conditions/risks identified: Yes; Reviewed health maintenance screenings with patient today and relevant education, vaccines, and/or referrals were provided. Please continue to do your personal lifestyle choices by: daily care of teeth and gums, regular physical activity (goal should be 5 days a week for 30 minutes), eat a healthy diet, avoid tobacco and drug use, limiting any alcohol intake, taking a low-dose aspirin (if not allergic or have been advised by your provider otherwise) and taking vitamins and minerals as recommended by your provider. Continue doing brain stimulating activities (puzzles, reading, adult coloring books, staying active) to keep memory sharp. Continue to eat heart healthy diet (full of fruits, vegetables, whole grains, lean protein, water--limit salt, fat, and sugar intake) and increase physical activity as tolerated.  Next appointment: Please schedule your next Medicare Wellness Visit with your Nurse Health Advisor in 1 year by calling [phone].  Preventive Care 31 Years and Older, Male Preventive care refers to lifestyle choices and visits with your health care provider that can promote health and  wellness. What does preventive care include?  A yearly physical exam. This is also called an annual well check.  Dental exams once or twice a year.  Routine eye exams. Ask your health care provider how often you should have your eyes checked.  Personal lifestyle choices, including:  Daily care of your teeth and gums.  Regular physical activity.  Eating a healthy diet.  Avoiding tobacco and drug use.  Limiting alcohol use.  Practicing safe sex.  Taking low doses of aspirin every day.  Taking vitamin and mineral supplements as recommended by your health care provider. What happens during an annual well check? The services and screenings done by your health care provider during your annual well check will depend on your age, overall health, lifestyle risk factors, and family history of disease. Counseling  Your health care provider may ask you questions about your:  Alcohol use.  Tobacco use.  Drug use.  Emotional well-being.  Home and relationship well-being.  Sexual activity.  Eating habits.  History of falls.  Memory and ability to understand (cognition).  Work and work Statistician. Screening  You may have the following tests or measurements:  Height, weight, and BMI.  Blood pressure.  Lipid and cholesterol levels. These may be checked every 5 years, or more frequently if you are over 51 years old.  Skin check.  Lung cancer screening. You may have this screening every year starting at age 51 if you have a 30-pack-year history of smoking and currently smoke or have quit within the past 15 years.  Fecal occult blood test (FOBT) of the stool. You may have this test every year starting at age 9.  Flexible sigmoidoscopy or colonoscopy. You may have a sigmoidoscopy every 5 years or a colonoscopy every 10 years starting at age 62.  Prostate cancer  screening. Recommendations will vary depending on your family history and other risks.  Hepatitis C blood  test.  Hepatitis B blood test.  Sexually transmitted disease (STD) testing.  Diabetes screening. This is done by checking your blood sugar (glucose) after you have not eaten for a while (fasting). You may have this done every 1-3 years.  Abdominal aortic aneurysm (AAA) screening. You may need this if you are a current or former smoker.  Osteoporosis. You may be screened starting at age 35 if you are at high risk. Talk with your health care provider about your test results, treatment options, and if necessary, the need for more tests. Vaccines  Your health care provider may recommend certain vaccines, such as:  Influenza vaccine. This is recommended every year.  Tetanus, diphtheria, and acellular pertussis (Tdap, Td) vaccine. You may need a Td booster every 10 years.  Zoster vaccine. You may need this after age 64.  Pneumococcal 13-valent conjugate (PCV13) vaccine. One dose is recommended after age 57.  Pneumococcal polysaccharide (PPSV23) vaccine. One dose is recommended after age 23. Talk to your health care provider about which screenings and vaccines you need and how often you need them. This information is not intended to replace advice given to you by your health care provider. Make sure you discuss any questions you have with your health care provider. Document Released: 09/26/2015 Document Revised: 05/19/2016 Document Reviewed: 07/01/2015 Elsevier Interactive Patient Education  2017 [place] Prevention in the Home Falls can cause injuries. They can happen to people of all ages. There are many things you can do to make your home safe and to help prevent falls. What can I do on the outside of my home?  Regularly fix the edges of walkways and driveways and fix any cracks.  Remove anything that might make you trip as you walk through a door, such as a raised step or threshold.  Trim any bushes or trees on the path to your home.  Use bright outdoor  lighting.  Clear any walking paths of anything that might make someone trip, such as rocks or tools.  Regularly check to see if handrails are loose or broken. Make sure that both sides of any steps have handrails.  Any raised decks and porches should have guardrails on the edges.  Have any leaves, snow, or ice cleared regularly.  Use sand or salt on walking paths during winter.  Clean up any spills in your garage right away. This includes oil or grease spills. What can I do in the bathroom?  Use night lights.  Install grab bars by the toilet and in the tub and shower. Do not use towel bars as grab bars.  Use non-skid mats or decals in the tub or shower.  If you need to sit down in the shower, use a plastic, non-slip stool.  Keep the floor dry. Clean up any water that spills on the floor as soon as it happens.  Remove soap buildup in the tub or shower regularly.  Attach bath mats securely with double-sided non-slip rug tape.  Do not have throw rugs and other things on the floor that can make you trip. What can I do in the bedroom?  Use night lights.  Make sure that you have a light by your bed that is easy to reach.  Do not use any sheets or blankets that are too big for your bed. They should not hang down onto the floor.  Have a firm  chair that has side arms. You can use this for support while you get dressed.  Do not have throw rugs and other things on the floor that can make you trip. What can I do in the kitchen?  Clean up any spills right away.  Avoid walking on wet floors.  Keep items that you use a lot in easy-to-reach places.  If you need to reach something above you, use a strong step stool that has a grab bar.  Keep electrical cords out of the way.  Do not use floor polish or wax that makes floors slippery. If you must use wax, use non-skid floor wax.  Do not have throw rugs and other things on the floor that can make you trip. What can I do with my  stairs?  Do not leave any items on the stairs.  Make sure that there are handrails on both sides of the stairs and use them. Fix handrails that are broken or loose. Make sure that handrails are as long as the stairways.  Check any carpeting to make sure that it is firmly attached to the stairs. Fix any carpet that is loose or worn.  Avoid having throw rugs at the top or bottom of the stairs. If you do have throw rugs, attach them to the floor with carpet tape.  Make sure that you have a light switch at the top of the stairs and the bottom of the stairs. If you do not have them, ask someone to add them for you. What else can I do to help prevent falls?  Wear shoes that:  Do not have high heels.  Have rubber bottoms.  Are comfortable and fit you well.  Are closed at the toe. Do not wear sandals.  If you use a stepladder:  Make sure that it is fully opened. Do not climb a closed stepladder.  Make sure that both sides of the stepladder are locked into place.  Ask someone to hold it for you, if possible.  Clearly mark and make sure that you can see:  Any grab bars or handrails.  First and last steps.  Where the edge of each step is.  Use tools that help you move around (mobility aids) if they are needed. These include:  Canes.  Walkers.  Scooters.  Crutches.  Turn on the lights when you go into a dark area. Replace any light bulbs as soon as they burn out.  Set up your furniture so you have a clear path. Avoid moving your furniture around.  If any of your floors are uneven, fix them.  If there are any pets around you, be aware of where they are.  Review your medicines with your doctor. Some medicines can make you feel dizzy. This can increase your chance of falling. Ask your doctor what other things that you can do to help prevent falls. This information is not intended to replace advice given to you by your health care provider. Make sure you discuss any  questions you have with your health care provider. Document Released: 06/26/2009 Document Revised: 02/05/2016 Document Reviewed: 10/04/2014 Elsevier Interactive Patient Education  2017 [employer].

## 2021-01-17 ENCOUNTER — Other Ambulatory Visit: Payer: Self-pay | Admitting: Internal Medicine

## 2021-01-21 ENCOUNTER — Telehealth: Payer: Self-pay | Admitting: Internal Medicine

## 2021-01-21 NOTE — Telephone Encounter (Signed)
No note needed 

## 2021-02-10 DIAGNOSIS — Z6828 Body mass index (BMI) 28.0-28.9, adult: Secondary | ICD-10-CM | POA: Diagnosis not present

## 2021-02-10 DIAGNOSIS — I951 Orthostatic hypotension: Secondary | ICD-10-CM | POA: Diagnosis not present

## 2021-02-10 DIAGNOSIS — E663 Overweight: Secondary | ICD-10-CM | POA: Diagnosis not present

## 2021-02-10 DIAGNOSIS — N529 Male erectile dysfunction, unspecified: Secondary | ICD-10-CM | POA: Diagnosis not present

## 2021-02-10 DIAGNOSIS — Z791 Long term (current) use of non-steroidal anti-inflammatories (NSAID): Secondary | ICD-10-CM | POA: Diagnosis not present

## 2021-02-10 DIAGNOSIS — Z7722 Contact with and (suspected) exposure to environmental tobacco smoke (acute) (chronic): Secondary | ICD-10-CM | POA: Diagnosis not present

## 2021-02-10 DIAGNOSIS — Z809 Family history of malignant neoplasm, unspecified: Secondary | ICD-10-CM | POA: Diagnosis not present

## 2021-02-10 DIAGNOSIS — G20 Parkinson's disease: Secondary | ICD-10-CM | POA: Diagnosis not present

## 2021-02-10 DIAGNOSIS — K219 Gastro-esophageal reflux disease without esophagitis: Secondary | ICD-10-CM | POA: Diagnosis not present

## 2021-02-10 DIAGNOSIS — R030 Elevated blood-pressure reading, without diagnosis of hypertension: Secondary | ICD-10-CM | POA: Diagnosis not present

## 2021-02-10 DIAGNOSIS — Z008 Encounter for other general examination: Secondary | ICD-10-CM | POA: Diagnosis not present

## 2021-04-08 ENCOUNTER — Ambulatory Visit: Payer: Medicare HMO | Admitting: Neurology

## 2021-04-16 ENCOUNTER — Other Ambulatory Visit: Payer: Self-pay | Admitting: Neurology

## 2021-04-16 NOTE — Telephone Encounter (Signed)
Okay per Dr.Tat patient has an appt tomorrow.

## 2021-04-16 NOTE — Progress Notes (Signed)
Assessment/Plan:   1.  Parkinsons Disease  -Increase carbidopa/levodopa 25/100, 2 tablets at 7 AM, 2 tablets at 11 AM, 2 tablets at 4 PM.  -Doing well with rotigotine patch, 4 mg daily.  Apply Flonase to skin prior to applying patch.  -Daughter had several questions and answered those to the best of my ability.  We discussed surgical options, including focused ultrasound and DBS and the differences between the 2.  Discussed medications.  Discussed exercise.  Discussed options for second opinions, if they would like.  2.  Mild jaw dyskinesia  -Not bothersome to patient.  He doesn't want amantadine right now 3.  [facility]  -Last neurocognitive testing in 2019.  I do not think anything clinically has significantly changed since that time.   Subjective:   William [name] was seen today in follow up for Parkinsons disease.  My previous records were reviewed prior to todays visit as well as outside records available to me.  Patient's daughter accompanies the patient and supplements history.  No falls since our last visit.  Continues to work, but considering retirement.  No compulsive behaviors or sleep attacks.  He is exercising.  Thinking about retiring by end of year.    Current prescribed movement disorder medications: Carbidopa/levodopa 25/100, 2 tablets in the morning, 1 in the afternoon, 1 in the evening Rotigotine patch, 4 mg daily   ALLERGIES:  No Known Allergies  CURRENT MEDICATIONS:  Outpatient Encounter Medications as of 04/17/2021  Medication Sig   carbidopa-levodopa (SINEMET IR) 25-100 MG tablet TAKE 2 TABLETS DAILY AT 7 AM,AND 1 TABLET AT 11 AM,AND 1 TABLET AT 4 PM   ferrous sulfate 325 (65 FE) MG tablet Take 325 mg by mouth daily with breakfast.   omeprazole (PRILOSEC) 40 MG capsule TAKE 1 CAPSULE BY MOUTH TWICE A DAY   psyllium (METAMUCIL) 58.6 % powder Take 1 packet by mouth 2 (two) times daily. 1 TBS Twice Daily.   rotigotine (NEUPRO) 4 MG/24HR Place 1 patch onto the skin  daily.   Sildenafil Citrate (VIAGRA PO) Take by mouth. Reported on 11/12/2015   No facility-administered encounter medications on file as of 04/17/2021.    Objective:   PHYSICAL EXAMINATION:    VITALS:   Vitals:   04/17/21 1245  BP: (!) 145/85  Pulse: 71  SpO2: 97%  Weight: 176 lb 3.2 oz (79.9 kg)  Height: 5' 7"  (1.702 m)     GEN:  The patient appears stated age and is in NAD. HEENT:  Normocephalic, atraumatic.  The mucous membranes are moist. The superficial temporal arteries are without ropiness or tenderness. CV:  RRR Lungs:  CTAB Neck/HEME:  There are no carotid bruits bilaterally.  Neurological examination:  Orientation: The patient is alert and oriented x3. Cranial nerves: There is good facial symmetry with mild facial hypomimia. The speech is fluent and clear. Soft palate rises symmetrically and there is no tongue deviation. Hearing is intact to conversational tone. Sensation: Sensation is intact to light touch throughout Motor: Strength is at least antigravity x4.  Movement examination: Tone: There is mod rigidity in the RUE Abnormal movements: mild jaw dyskinesia.   Coordination:  There is mild decremation with RAM's with finger taps on the L only Gait and Station: The patient has minimal difficulty arising out of a deep-seated chair without the use of the hands. The patient has some stutter steps upon first initiating gait, and then is able to walk quite well.  He drags the R leg a  bit.  He has decreased arm swing on the right.    I have reviewed and interpreted the following labs independently    Chemistry      Component Value Date/Time   NA 131 (L) 04/15/2020 0851   K 4.4 04/15/2020 0851   CL 102 04/15/2020 0851   CO2 25 04/15/2020 0851   BUN 24 (H) 04/15/2020 0851   CREATININE 1.13 04/15/2020 0851   CREATININE 1.07 06/27/2018 0915      Component Value Date/Time   CALCIUM 9.5 04/15/2020 0851   ALKPHOS 37 (L) 04/15/2020 0851   AST 24 04/15/2020 0851    ALT 17 04/15/2020 0851   BILITOT 0.6 04/15/2020 0851       Lab Results  Component Value Date   WBC 3.4 (L) 04/15/2020   HGB 13.5 04/15/2020   HCT 39.6 04/15/2020   MCV 98.4 04/15/2020   PLT 185.0 04/15/2020    Lab Results  Component Value Date   TSH 2.40 11/28/2018     Total time spent on today's visit was 33 minutes, including both face-to-face time and nonface-to-face time.  Time included that spent on review of records (prior notes available to me/labs/imaging if pertinent), discussing treatment and goals, answering patient's questions and coordinating care.  Cc:  Plotnikov, Evie Lacks, MD

## 2021-04-17 ENCOUNTER — Other Ambulatory Visit: Payer: Self-pay

## 2021-04-17 ENCOUNTER — Encounter: Payer: Self-pay | Admitting: Neurology

## 2021-04-17 ENCOUNTER — Ambulatory Visit: Payer: Medicare HMO | Admitting: Neurology

## 2021-04-17 VITALS — BP 145/85 | HR 71 | Ht 67.0 in | Wt 176.2 lb

## 2021-04-17 DIAGNOSIS — G249 Dystonia, unspecified: Secondary | ICD-10-CM | POA: Diagnosis not present

## 2021-04-17 DIAGNOSIS — G20 Parkinson's disease: Secondary | ICD-10-CM

## 2021-04-17 MED ORDER — NEUPRO 4 MG/24HR TD PT24: 1.0000 | MEDICATED_PATCH | Freq: Every day | TRANSDERMAL | 1 refills | Status: DC

## 2021-04-17 MED ORDER — CARBIDOPA-LEVODOPA 25-100 MG PO TABS: 2.0000 | ORAL_TABLET | Freq: Three times a day (TID) | ORAL | 1 refills | Status: DC

## 2021-04-17 NOTE — Patient Instructions (Signed)
Take carbidopa/levodopa 25/100, 2 tablets at 7am/11am/4pm Continue the neupro patch 4 mg daily.  The physicians and staff at [facility] are committed to providing excellent care. You may receive a survey requesting feedback about your experience at our office. We strive to receive "very good" responses to the survey questions. If you feel that your experience would prevent you from giving the office a "very good " response, please contact our office to try to remedy the situation. We may be reached at [phone]. Thank you for taking the time out of your busy day to complete the survey.

## 2021-04-29 DIAGNOSIS — M25521 Pain in right elbow: Secondary | ICD-10-CM | POA: Diagnosis not present

## 2021-07-01 ENCOUNTER — Telehealth: Payer: Self-pay | Admitting: Neurology

## 2021-07-01 NOTE — Telephone Encounter (Signed)
Patient called to report shooting pains on the right side of his head since yesterday. This is a new symptom.  This has reportedly gotten worse today. Patient requesting advice.

## 2021-07-02 NOTE — Telephone Encounter (Signed)
Called patient and he said he is not having the symptom today. He did say he would call PCP and go to ED if it comes back. I let patient know to please let me know if he needs anything . Patient agreed

## 2021-07-02 NOTE — Progress Notes (Signed)
Subjective:    Patient ID: William [name], male    DOB: [birth year], 70 y.o.   MRN: [id number]  This visit occurred during the SARS-CoV-2 public health emergency.  Safety protocols were in place, including screening questions prior to the visit, additional usage of staff PPE, and extensive cleaning of exam room while observing appropriate contact time as indicated for disinfecting solutions.    HPI The patient is here for an acute visit.  2 days ago he had intermittent shooting pain on the left side of his head only.  He was in the posterior, middle and then more frontal part of his head.  He was a sharp shooting pain that lasted 1-2 seconds only.  He had a few instances of this and has not had any since then.  Today is a mild dull frontal headache, which he thinks is more normal and he is not concerned about that.  He thinks the cocktail tonight may take care of that.  He denies any neck pain or neck stiffness.  He may have some upper back muscle tightness which is work stress, but nothing new.  He typically does not get headaches on a regular basis.  He was concerned because this was such a different type of headache.  He denies any numbness, tingling or weakness.  There is been no blurry vision.  Medications and allergies reviewed with patient and updated if appropriate.  Patient Active Problem List   Diagnosis Date Noted   Cough 12/09/2019   URI (upper respiratory infection) 01/20/2019   Chest pain 05/28/2017   Hyperlipidemia 05/28/2017   Bradycardia 05/28/2017   Chest pain, atypical 05/27/2017   Lightheadedness 05/27/2017   Finger laceration 04/04/2017   Parkinson's disease ([place]) 02/26/2016   Eustachian tube dysfunction 02/06/2016   Hyponatremia 01/26/2016   Tremor 11/12/2015   Visual-spatial impairment 11/12/2015   Stress at work 10/24/2014   Nonspecific abnormal electrocardiogram (ECG) (EKG) 06/18/2014   S/P colectomy 06/18/2014   Well adult exam 06/18/2014   Renal  stones 06/18/2014   Elevated BP 06/18/2014   Headache(784.0) 04/25/2014   Essential hypertension, benign 04/25/2014   Serous otitis media 03/21/2014   Travel advice encounter 04/17/2013   Calcium nephrolithiasis 10/04/2012   Routine health maintenance 10/04/2012   Unspecified intestinal obstruction 12/03/2008   Other diseases of nasal cavity and sinuses(478.19) 05/30/2008   GERD 02/26/2008   PEPTIC STRICTURE 02/22/2008   HIATAL HERNIA 02/22/2008   Chest tightness or pressure 01/09/2008   ANEMIA-IRON DEFICIENCY 04/07/2007   ERECTILE DYSFUNCTION 04/07/2007   Allergic rhinitis 04/07/2007   COLITIS, ULCERATIVE NOS 04/07/2007   GASTRIC POLYP, HX OF 04/07/2007    Current Outpatient Medications on File Prior to Visit  Medication Sig Dispense Refill   carbidopa-levodopa (SINEMET IR) 25-100 MG tablet TAKE 2 TABLETS DAILY AT 7 AM,AND 1 TABLET AT 11 AM,AND 1 TABLET AT 4 PM 360 tablet 0   carbidopa-levodopa (SINEMET IR) 25-100 MG tablet Take 2 tablets by mouth 3 (three) times daily. 540 tablet 1   ferrous sulfate 325 (65 FE) MG tablet Take 325 mg by mouth daily with breakfast.     omeprazole (PRILOSEC) 40 MG capsule TAKE 1 CAPSULE BY MOUTH TWICE A DAY 180 capsule 3   psyllium (METAMUCIL) 58.6 % powder Take 1 packet by mouth 2 (two) times daily. 1 TBS Twice Daily.     rotigotine (NEUPRO) 4 MG/24HR Place 1 patch onto the skin daily. 90 patch 1   Sildenafil Citrate (VIAGRA PO) Take by  mouth. Reported on 11/12/2015     No current facility-administered medications on file prior to visit.    Past Medical History:  Diagnosis Date   Allergic rhinitis    Anemia, iron deficiency    Arthritis    Blood transfusion without reported diagnosis    Colonic stricture [facility])    ED (erectile dysfunction)    Esophageal stricture    Status post upper endoscopy with esophageal dilatation   Gastric polyps    GERD (gastroesophageal reflux disease)    Hiatal hernia    Hypertension    Hyponatremia    Inguinal  hernia    Kidney stone    x 2   Parkinson disease ([place])    Ulcerative colitis    status post total abdominal colectomy with ileoanal anastomosis    Past Surgical History:  Procedure Laterality Date   COLECTOMY  1992   with ilial anal pull through (for ulcerative colitis)   ETHMOIDECTOMY     and maxillary enterostomies '04, redo in 12/04   [place]     removed 2013   KNEE ARTHROSCOPY Right 2004   LASIK      Social History   Socioeconomic History   Marital status: Married    Spouse name: Not on file   Number of children: 0   Years of education: Not on file   Highest education level: Bachelor's degree (e.g., BA, AB, BS)  Occupational History   Occupation: Public house manager: [employer]  Tobacco Use   Smoking status: Former    Types: Cigarettes    Quit date: 09/14/1979    Years since quitting: 41.8   Smokeless tobacco: Never   Tobacco comments:    smoked for 7 years  Vaping Use   Vaping Use: Never used  Substance and Sexual Activity   Alcohol use: Yes    Alcohol/week: 0.0 standard drinks    Comment: 2 glasses of wine per night   Drug use: No   Sexual activity: Yes  Other Topics Concern   Not on file  Social History Narrative   [employer] grad   Married 13.5 years- divorced; married '05   No children   Work: Insurance underwriter   Patient is a former smoker. Quit in 1981   Alcohol use- yes social   Daily Caffeine use 2 cups coffee per day   Illicit drug use- no   Right handed   Two story home   Social Determinants of Health   Financial Resource Strain: Low Risk    Difficulty of Paying Living Expenses: Not hard at all  Food Insecurity: No Food Insecurity   Worried About Charity fundraiser in the Last Year: Never true   Ran Out of Food in the Last Year: Never true  Transportation Needs: No Transportation Needs   Lack of Transportation (Medical): No   Lack of Transportation (Non-Medical): No  Physical  Activity: Sufficiently Active   Days of Exercise per Week: 5 days   Minutes of Exercise per Session: 30 min  Stress: No Stress Concern Present   Feeling of Stress : Not at all  Social Connections: Not on file    Family History  Problem Relation Age of Onset   Cancer Father        rectal cancer and MM   Rectal cancer Father    Dementia Mother    Hypertension Mother    Heart disease Brother  valve repaired at 13   Colon cancer Neg Hx    Esophageal cancer Neg Hx    Stomach cancer Neg Hx     Review of Systems  Constitutional:  Negative for fever.  Eyes:  Negative for visual disturbance.  Neurological:  Positive for headaches. Negative for dizziness, weakness, light-headedness and numbness.      Objective:   Vitals:   07/03/21 1542  BP: 136/72  Pulse: 60  Temp: 97.9 F (36.6 C)  SpO2: 98%   BP Readings from Last 3 Encounters:  07/03/21 136/72  04/17/21 (!) 145/85  12/05/20 110/70   Wt Readings from Last 3 Encounters:  07/03/21 178 lb (80.7 kg)  04/17/21 176 lb 3.2 oz (79.9 kg)  12/05/20 176 lb 12.8 oz (80.2 kg)   Body mass index is 27.88 kg/m.   Physical Exam Constitutional:      General: He is not in acute distress.    Appearance: He is well-developed. He is not ill-appearing.  HENT:     Head: Normocephalic and atraumatic.  Musculoskeletal:     Comments: Mild bilateral trapezius tenderness, but no posterior neck tenderness or cervical spine tenderness.  No tenderness along base of skull.  No left-sided head tenderness  Skin:    General: Skin is warm and dry.  Neurological:     Mental Status: He is alert and oriented to person, place, and time.     Cranial Nerves: No cranial nerve deficit, dysarthria or facial asymmetry.     Sensory: No sensory deficit.     Motor: No weakness.  Psychiatric:        Mood and Affect: Mood normal.        Behavior: Behavior normal.           Assessment & Plan:    Occipital neuralgia on left  side: Acute Symptoms he is describing are consistent with occipital neuralgia on the left side of his head-this occurred 2 days ago and he has not had a recurrence At this point no treatment is necessary Discussed etiology -Can also discuss with his neurologist Dr. Carles Collet  He does have a slight dull headache today which is likely stress related and he is not concerned about that-we will treat at home advised to call if he has recurrent symptoms   Advised to call with any concerns or questions

## 2021-07-03 ENCOUNTER — Encounter: Payer: Self-pay | Admitting: Internal Medicine

## 2021-07-03 ENCOUNTER — Ambulatory Visit (INDEPENDENT_AMBULATORY_CARE_PROVIDER_SITE_OTHER): Payer: Medicare HMO | Admitting: Internal Medicine

## 2021-07-03 ENCOUNTER — Other Ambulatory Visit: Payer: Self-pay

## 2021-07-03 VITALS — BP 136/72 | HR 60 | Temp 97.9°F | Ht 67.0 in | Wt 178.0 lb

## 2021-07-03 DIAGNOSIS — M5481 Occipital neuralgia: Secondary | ICD-10-CM | POA: Diagnosis not present

## 2021-07-03 NOTE — Patient Instructions (Signed)
    You left sided head pain is likely nerve related.  This is not concerning.

## 2021-07-08 ENCOUNTER — Telehealth: Payer: Self-pay | Admitting: Internal Medicine

## 2021-07-08 NOTE — Telephone Encounter (Signed)
Pt. called on 10.21.2022 at 12:15pm stating that he and his wife Have sharp pains in their heads. Caller states left side of his wife Face is swollen under jaw, and into eyebrow.    Chief complaint- Facial Swelling     Plan- See PCP within 24 hours   Spoke with pt. On 10.25.2022 and he and wife expressed  they no longer are  experiencing pains in head.

## 2021-07-08 NOTE — Telephone Encounter (Signed)
Noted.Marland KitchenMarland KitchenPt had appt w/ Dr. Quay Burow on 07/03/21...William [name]

## 2021-07-10 DIAGNOSIS — Z23 Encounter for immunization: Secondary | ICD-10-CM | POA: Diagnosis not present

## 2021-07-20 ENCOUNTER — Other Ambulatory Visit: Payer: Self-pay | Admitting: Neurology

## 2021-07-20 DIAGNOSIS — G20B1 Parkinson's disease with dyskinesia, without mention of fluctuations: Secondary | ICD-10-CM

## 2021-07-20 DIAGNOSIS — G20A1 Parkinson's disease without dyskinesia, without mention of fluctuations: Secondary | ICD-10-CM

## 2021-07-20 DIAGNOSIS — G20 Parkinson's disease: Secondary | ICD-10-CM

## 2021-07-20 DIAGNOSIS — G249 Dystonia, unspecified: Secondary | ICD-10-CM

## 2021-08-11 DIAGNOSIS — M25521 Pain in right elbow: Secondary | ICD-10-CM | POA: Diagnosis not present

## 2021-08-11 DIAGNOSIS — M7711 Lateral epicondylitis, right elbow: Secondary | ICD-10-CM | POA: Diagnosis not present

## 2021-09-10 DIAGNOSIS — N50812 Left testicular pain: Secondary | ICD-10-CM | POA: Diagnosis not present

## 2021-09-10 DIAGNOSIS — N200 Calculus of kidney: Secondary | ICD-10-CM | POA: Diagnosis not present

## 2021-09-10 DIAGNOSIS — N486 Induration penis plastica: Secondary | ICD-10-CM | POA: Diagnosis not present

## 2021-09-10 DIAGNOSIS — N5201 Erectile dysfunction due to arterial insufficiency: Secondary | ICD-10-CM | POA: Diagnosis not present

## 2021-10-16 NOTE — Progress Notes (Signed)
Assessment/Plan:   1.  Parkinsons Disease  -he looks well treated  -continue carbidopa/levodopa 25/100, 2 tablets at 7 AM, 2 tablets at 11 AM, 2 tablets at 4 PM.  -He does get some skin reaction with the rotigotine.  We talked about switching to ropinirole, but ultimately he really would like to stay with rotigotine.  He is already using the Flonase prior to applying the patch.  He will continue with rotigotine patch, 4 mg daily.    -We discussed that it used to be thought that levodopa would increase risk of melanoma but now it is believed that Parkinsons itself likely increases risk of melanoma. he is to get regular skin checks.  2.  Mild jaw dyskinesia  -Not bothersome to patient.  He doesn't want amantadine right now 3.  [facility]  -Last neurocognitive testing in 2019.  I do not think anything clinically has significantly changed since that time.   Subjective:   William [name] was seen today in follow up for Parkinsons disease.  My previous records were reviewed prior to todays visit as well as outside records available to me.  Patient is with his wife who supplements the history.  We increased his levodopa last visit and patient reports that somedays are "rough" and some are "good."  Today is a good day.  He is dancing occ and doing well with that.  Treadmill for exercise.   He sometimes feels near syncopal but a lunch/meal will help.  Not taking BP at home.  Occ dyskinesia.  Not bothersome enough to take more medication.    Current prescribed movement disorder medications: Carbidopa/levodopa 25/100,2 tablets at 7 AM, 2 tablets at 11 AM, 2 tablets at 4 PM.  (An increase) Rotigotine patch, 4 mg daily  Prior meds:  pramipexole er (switched due to a near syncopal episode after drink of alcohol)   ALLERGIES:  No Known Allergies  CURRENT MEDICATIONS:  Outpatient Encounter Medications as of 10/21/2021  Medication Sig   carbidopa-levodopa (SINEMET IR) 25-100 MG tablet Take 2 tablets by  mouth 3 (three) times daily.   ferrous sulfate 325 (65 FE) MG tablet Take 325 mg by mouth daily with breakfast.   omeprazole (PRILOSEC) 40 MG capsule TAKE 1 CAPSULE BY MOUTH TWICE A DAY   psyllium (METAMUCIL) 58.6 % powder Take 1 packet by mouth 2 (two) times daily. 1 TBS Twice Daily.   rotigotine (NEUPRO) 4 MG/24HR Place 1 patch onto the skin daily.   Sildenafil Citrate (VIAGRA PO) Take by mouth. Reported on 11/12/2015   carbidopa-levodopa (SINEMET IR) 25-100 MG tablet TAKE 2 TABLETS DAILY AT 7 AM,AND 2 TABLETS AT 11 AM,AND 2 TABLETS AT 4 PM   No facility-administered encounter medications on file as of 10/21/2021.    Objective:   PHYSICAL EXAMINATION:    VITALS:   Vitals:   10/21/21 0924  BP: (!) 142/63  Pulse: 62  SpO2: 97%  Weight: 177 lb 12.8 oz (80.6 kg)  Height: 5' 7"  (1.702 m)      GEN:  The patient appears stated age and is in NAD. HEENT:  Normocephalic, atraumatic.  The mucous membranes are moist. The superficial temporal arteries are without ropiness or tenderness.   Neurological examination:  Orientation: The patient is alert and oriented x3. Cranial nerves: There is good facial symmetry with mild facial hypomimia. The speech is fluent and clear. Soft palate rises symmetrically and there is no tongue deviation. Hearing is intact to conversational tone. Sensation: Sensation is intact  to light touch throughout Motor: Strength is at least antigravity x4.  Movement examination: Tone: There is normal tone in the upper and lower extremities. Abnormal movements: None Coordination:  There is no decremation, with any form of RAMS, including alternating supination and pronation of the forearm, hand opening and closing, finger taps, heel taps and toe taps. Gait and Station: The patient ambulates well in the hall today.  Good arm swing.  No shuffling.  Turns well.    I have reviewed and interpreted the following labs independently    Chemistry      Component Value  Date/Time   NA 131 (L) 04/15/2020 0851   K 4.4 04/15/2020 0851   CL 102 04/15/2020 0851   CO2 25 04/15/2020 0851   BUN 24 (H) 04/15/2020 0851   CREATININE 1.13 04/15/2020 0851   CREATININE 1.07 06/27/2018 0915      Component Value Date/Time   CALCIUM 9.5 04/15/2020 0851   ALKPHOS 37 (L) 04/15/2020 0851   AST 24 04/15/2020 0851   ALT 17 04/15/2020 0851   BILITOT 0.6 04/15/2020 0851       Lab Results  Component Value Date   WBC 3.4 (L) 04/15/2020   HGB 13.5 04/15/2020   HCT 39.6 04/15/2020   MCV 98.4 04/15/2020   PLT 185.0 04/15/2020    Lab Results  Component Value Date   TSH 2.40 11/28/2018      Cc:  Plotnikov, Evie Lacks, MD

## 2021-10-21 ENCOUNTER — Other Ambulatory Visit: Payer: Self-pay

## 2021-10-21 ENCOUNTER — Ambulatory Visit: Payer: Medicare HMO | Admitting: Neurology

## 2021-10-21 VITALS — BP 142/63 | HR 62 | Ht 67.0 in | Wt 177.8 lb

## 2021-10-21 DIAGNOSIS — G249 Dystonia, unspecified: Secondary | ICD-10-CM

## 2021-10-21 DIAGNOSIS — G20 Parkinson's disease: Secondary | ICD-10-CM

## 2021-10-21 NOTE — Patient Instructions (Signed)
See your dermatologist once a year.  The physicians and staff at [facility] are committed to providing excellent care. You may receive a survey requesting feedback about your experience at our office. We strive to receive "very good" responses to the survey questions. If you feel that your experience would prevent you from giving the office a "very good " response, please contact our office to try to remedy the situation. We may be reached at [phone]. Thank you for taking the time out of your busy day to complete the survey.

## 2021-10-26 ENCOUNTER — Ambulatory Visit (INDEPENDENT_AMBULATORY_CARE_PROVIDER_SITE_OTHER): Payer: Medicare HMO | Admitting: Internal Medicine

## 2021-10-26 ENCOUNTER — Other Ambulatory Visit: Payer: Self-pay

## 2021-10-26 ENCOUNTER — Encounter: Payer: Self-pay | Admitting: Internal Medicine

## 2021-10-26 VITALS — BP 150/90 | HR 81 | Temp 97.7°F | Ht 67.0 in | Wt 178.0 lb

## 2021-10-26 DIAGNOSIS — G20 Parkinson's disease: Secondary | ICD-10-CM

## 2021-10-26 DIAGNOSIS — G500 Trigeminal neuralgia: Secondary | ICD-10-CM

## 2021-10-26 MED ORDER — VALACYCLOVIR HCL 1 G PO TABS: 1000.0000 mg | ORAL_TABLET | Freq: Three times a day (TID) | ORAL | 0 refills | Status: AC

## 2021-10-26 MED ORDER — TRAMADOL HCL 50 MG PO TABS: 50.0000 mg | ORAL_TABLET | Freq: Four times a day (QID) | ORAL | 0 refills | Status: AC | PRN

## 2021-10-26 MED ORDER — METHYLPREDNISOLONE 4 MG PO TBPK: ORAL_TABLET | ORAL | 0 refills | Status: DC

## 2021-10-26 NOTE — Assessment & Plan Note (Addendum)
New - worsening Rx Given Valtrex, Medrol, Tramadol Records reviewed incl MRI  2018  Potential benefits of a short term opioids use as well as potential risks (i.e. addiction risk, apnea etc) and complications (i.e. Somnolence, constipation and others) were explained to the patient and were aknowledged.  Potential benefits of a short term steroid  use as well as potential risks  and complications were explained to the patient and were aknowledged.

## 2021-10-26 NOTE — Progress Notes (Signed)
Subjective:  Patient ID: William [name], male    DOB: 02/18/1951  Age: 71 y.o. MRN: [id number]  CC: No chief complaint on file.   HPI William [name] presents for L ?trigeminal neurlgia sx's x 3 wks - worse. No rash. Pain is 7/10 or more at times when touching L lip/skin above it...  Outpatient Medications Prior to Visit  Medication Sig Dispense Refill   carbidopa-levodopa (SINEMET IR) 25-100 MG tablet TAKE 2 TABLETS DAILY AT 7 AM,AND 2 TABLETS AT 11 AM,AND 2 TABLETS AT 4 PM 540 tablet 0   ferrous sulfate 325 (65 FE) MG tablet Take 325 mg by mouth daily with breakfast.     omeprazole (PRILOSEC) 40 MG capsule TAKE 1 CAPSULE BY MOUTH TWICE A DAY 180 capsule 3   psyllium (METAMUCIL) 58.6 % powder Take 1 packet by mouth 2 (two) times daily. 1 TBS Twice Daily.     rotigotine (NEUPRO) 4 MG/24HR Place 1 patch onto the skin daily. 90 patch 1   Sildenafil Citrate (VIAGRA PO) Take by mouth. Reported on 11/12/2015     carbidopa-levodopa (SINEMET IR) 25-100 MG tablet Take 2 tablets by mouth 3 (three) times daily. 540 tablet 1   No facility-administered medications prior to visit.    ROS: Review of Systems  Constitutional:  Negative for appetite change, fatigue, fever and unexpected weight change.  HENT:  Negative for congestion, nosebleeds, sneezing, sore throat and trouble swallowing.   Eyes:  Negative for itching and visual disturbance.  Respiratory:  Negative for cough.   Cardiovascular:  Negative for chest pain, palpitations and leg swelling.  Gastrointestinal:  Negative for abdominal distention, blood in stool, diarrhea and nausea.  Genitourinary:  Negative for frequency and hematuria.  Musculoskeletal:  Positive for gait problem. Negative for back pain, joint swelling and neck pain.  Skin:  Negative for color change and rash.  Neurological:  Positive for tremors and headaches. Negative for dizziness, speech difficulty, weakness and light-headedness.  Psychiatric/Behavioral:  Negative for  agitation, dysphoric mood and sleep disturbance. The patient is not nervous/anxious.    Objective:  BP (!) 150/90 (BP Location: Left Arm, Patient Position: Sitting, Cuff Size: Large)    Pulse 81    Temp 97.7 F (36.5 C) (Oral)    Ht 5' 7"  (1.702 m)    Wt 178 lb (80.7 kg)    SpO2 94%    BMI 27.88 kg/m   BP Readings from Last 3 Encounters:  10/26/21 (!) 150/90  10/21/21 (!) 142/63  07/03/21 136/72    Wt Readings from Last 3 Encounters:  10/26/21 178 lb (80.7 kg)  10/21/21 177 lb 12.8 oz (80.6 kg)  07/03/21 178 lb (80.7 kg)    Physical Exam Constitutional:      General: He is not in acute distress.    Appearance: He is well-developed.     Comments: NAD  Eyes:     Conjunctiva/sclera: Conjunctivae normal.     Pupils: Pupils are equal, round, and reactive to light.  Neck:     Thyroid: No thyromegaly.     Vascular: No JVD.  Cardiovascular:     Rate and Rhythm: Normal rate and regular rhythm.     Heart sounds: Normal heart sounds. No murmur heard.   No friction rub. No gallop.  Pulmonary:     Effort: Pulmonary effort is normal. No respiratory distress.     Breath sounds: Normal breath sounds. No wheezing or rales.  Chest:     Chest wall: No  tenderness.  Abdominal:     General: Bowel sounds are normal. There is no distension.     Palpations: Abdomen is soft. There is no mass.     Tenderness: There is no abdominal tenderness. There is no guarding or rebound.  Musculoskeletal:        General: No tenderness. Normal range of motion.     Cervical back: Normal range of motion.  Lymphadenopathy:     Cervical: No cervical adenopathy.  Skin:    General: Skin is warm and dry.     Findings: No rash.  Neurological:     Mental Status: He is alert and oriented to person, place, and time.     Cranial Nerves: No cranial nerve deficit.     Motor: No abnormal muscle tone.     Coordination: Coordination normal.     Gait: Gait normal.     Deep Tendon Reflexes: Reflexes are normal and  symmetric.  Psychiatric:        Behavior: Behavior normal.        Thought Content: Thought content normal.        Judgment: Judgment normal.  No blisters  Lab Results  Component Value Date   WBC 3.4 (L) 04/15/2020   HGB 13.5 04/15/2020   HCT 39.6 04/15/2020   PLT 185.0 04/15/2020   GLUCOSE 104 (H) 04/15/2020   CHOL 157 04/06/2017   TRIG 55.0 04/06/2017   HDL 69.70 04/06/2017   LDLCALC 76 04/06/2017   ALT 17 04/15/2020   AST 24 04/15/2020   NA 131 (L) 04/15/2020   K 4.4 04/15/2020   CL 102 04/15/2020   CREATININE 1.13 04/15/2020   BUN 24 (H) 04/15/2020   CO2 25 04/15/2020   TSH 2.40 11/28/2018   PSA 0.98 04/06/2017    DG Chest 2 View  Result Date: 05/27/2017 CLINICAL DATA:  Chest pressure radiating into right shoulder. EXAM: CHEST  2 VIEW COMPARISON:  05/27/2017 FINDINGS: The heart size and mediastinal contours are within normal limits. Both lungs are clear. The visualized skeletal structures are unremarkable. IMPRESSION: No active cardiopulmonary disease. Electronically Signed   By: Kerby Moors M.D.   On: 05/27/2017 18:47   DG Chest 2 View  Result Date: 05/27/2017 CLINICAL DATA:  Left chest pain EXAM: CHEST  2 VIEW COMPARISON:  10/23/2009 FINDINGS: The heart size and mediastinal contours are within normal limits. Both lungs are clear. The visualized skeletal structures are unremarkable. IMPRESSION: No active cardiopulmonary disease. Electronically Signed   By: Jerilynn Mages.  Shick M.D.   On: 05/27/2017 11:53   ECHOCARDIOGRAM COMPLETE  Result Date: 05/28/2017                            *[facility]*                         [address] [place], [place] [address]                            [phone] ------------------------------------------------------------------- Transthoracic Echocardiography Patient:    William [name] MR #:       [id number] Study Date: 05/28/2017  Gender:     M Age:        36 Height:      170.2 cm Weight:     75 kg BSA:        1.9 m^2 Pt. Status: Room:       3W29C  SONOGRAPHER  Tresa Res, RDCS  ATTENDING    Wynetta Emery, Clanford L  PERFORMING   [facility], Inpatient  ADMITTING    Fuller Plan A  ORDERING     Smith, Rondell A  REFERRING    Smith, Rondell A cc: ------------------------------------------------------------------- LV EF: 60% -   65% ------------------------------------------------------------------- Indications:      Chest pain 786.51. ------------------------------------------------------------------- History:   Risk factors:  Former tobacco use. Hypertension. ------------------------------------------------------------------- Study Conclusions - Left ventricle: The cavity size was normal. Systolic function was   normal. The estimated ejection fraction was in the range of 60%   to 65%. Wall motion was normal; there were no regional wall   motion abnormalities. Mild concentric and moderate focal basal   septal hypertrophy. Diastolic dysfunction, grade indeterminate.   Indeterminate filling pressures. - Aortic valve: There was mild regurgitation. - Aorta: Mild aortic root dilatation. - Mitral valve: There was mild regurgitation. - Left atrium: The atrium was moderately dilated. - Atrial septum: No defect or patent foramen ovale was identified. ------------------------------------------------------------------- Study data:   Study status:  Routine.  Study completion:  There were no complications.          Transthoracic echocardiography. M-mode, complete 2D, spectral Doppler, and color Doppler. Birthdate:  Patient birthdate: 05/07/1951.  Age:  Patient is 71 yr old.  Sex:  Gender: male.    BMI: 25.9 kg/m^2.  Blood pressure: 167/70  Patient status:  Inpatient.  Study date:  Study date: 05/28/2017. Study time: 10:44 AM.  Location:  Bedside. ------------------------------------------------------------------- ------------------------------------------------------------------- Left ventricle:  The cavity  size was normal. Systolic function was normal. The estimated ejection fraction was in the range of 60% to 65%. Wall motion was normal; there were no regional wall motion abnormalities. Mild concentric and moderate focal basal septal hypertrophy. Diastolic dysfunction, grade indeterminate. Indeterminate filling pressures. ------------------------------------------------------------------- Aortic valve:   Trileaflet.  Doppler:   There was no stenosis. There was mild regurgitation. ------------------------------------------------------------------- Aorta:  Mild aortic root dilatation. ------------------------------------------------------------------- Mitral valve:   Structurally normal valve.   Leaflet separation was normal.  Doppler:  There was no evidence for stenosis. There was mild regurgitation.    Valve area by pressure half-time: 3.01 cm^2. Indexed valve area by pressure half-time: 1.59 cm^2/m^2. ------------------------------------------------------------------- Left atrium:  The atrium was moderately dilated. ------------------------------------------------------------------- Atrial septum:  No defect or patent foramen ovale was identified.  ------------------------------------------------------------------- Right ventricle:  The cavity size was normal. Wall thickness was normal. Systolic function was normal. ------------------------------------------------------------------- Pulmonic valve:   Poorly visualized.  Doppler:  There was no significant regurgitation. ------------------------------------------------------------------- Tricuspid valve:   Structurally normal valve.   Leaflet separation was normal.  Doppler:  Transvalvular velocity was within the normal range. There was trivial regurgitation. ------------------------------------------------------------------- Right atrium:  The atrium was normal in size. ------------------------------------------------------------------- Pericardium:  There was no  pericardial effusion. ------------------------------------------------------------------- Systemic veins: Inferior vena cava: The vessel was normal in size. The respirophasic diameter changes were in the normal range (>= 50%), consistent with normal central venous pressure. ------------------------------------------------------------------- Post procedure conclusions Ascending Aorta: - Mild aortic root dilatation. ------------------------------------------------------------------- Measurements  Left ventricle  Value          Reference  LV ID, ED, PLAX chordal                48    mm       43 - 52  LV ID, ES, PLAX chordal                33    mm       23 - 38  LV fx shortening, PLAX chordal         31    %        >=29  LV PW thickness, ED                    12    mm       ---------  IVS/LV PW ratio, ED                    1.25           <=1.3  Stroke volume, 2D                      95    ml       ---------  Stroke volume/bsa, 2D                  50    ml/m^2   ---------  LV e&', lateral                         7.93  cm/s     ---------  LV E/e&', lateral                       8.35           ---------  LV e&', medial                          4.73  cm/s     ---------  LV E/e&', medial                        14             ---------  LV e&', average                         6.33  cm/s     ---------  LV E/e&', average                       10.46          ---------   Ventricular septum                     Value          Reference  IVS thickness, ED                      15    mm       ---------   LVOT                                   Value          Reference  LVOT ID, S  25    mm       ---------  LVOT area                              4.91  cm^2     ---------  LVOT peak velocity, S                  67.5  cm/s     ---------  LVOT mean velocity, S                  49.1  cm/s     ---------  LVOT VTI, S                            19.4  cm       ---------  LVOT peak gradient, S                   2     mm Hg    ---------   Aorta                                  Value          Reference  Aortic root ID, ED                     39    mm       ---------   Left atrium                            Value          Reference  LA ID, A-P, ES                         45    mm       ---------  LA ID/bsa, A-P                 (H)     2.37  cm/m^2   <=2.2  LA volume, S                           85    ml       ---------  LA volume/bsa, S                       44.8  ml/m^2   ---------  LA volume, ES, 1-p A4C                 67.6  ml       ---------  LA volume/bsa, ES, 1-p A4C             35.7  ml/m^2   ---------  LA volume, ES, 1-p A2C                 90.8  ml       ---------  LA volume/bsa, ES, 1-p A2C             47.9  ml/m^2   ---------   Mitral valve  Value          Reference  Mitral E-wave peak velocity            66.2  cm/s     ---------  Mitral A-wave peak velocity            70.6  cm/s     ---------  Mitral deceleration time       (H)     250   ms       150 - 230  Mitral pressure half-time              73    ms       ---------  Mitral E/A ratio, peak                 0.9            ---------  Mitral valve area, PHT, DP             3.01  cm^2     ---------  Mitral valve area/bsa, PHT, DP         1.59  cm^2/m^2 ---------   Pulmonary arteries                     Value          Reference  PA pressure, S, DP                     25    mm Hg    <=30   Tricuspid valve                        Value          Reference  Tricuspid regurg peak velocity         232   cm/s     ---------  Tricuspid peak RV-RA gradient          22    mm Hg    ---------   Systemic veins                         Value          Reference  Estimated CVP                          3     mm Hg    ---------   Right ventricle                        Value          Reference  TAPSE                                  29.5  mm       ---------  RV pressure, S, DP                     25    mm Hg    <=30  RV s&', lateral, S                       15.4  cm/s     --------- Legend: (L)  and  (H)  mark values outside specified reference range. ------------------------------------------------------------------- Prepared and Electronically Authenticated by Kate Sable,  MD 2018-09-15T16:47:07   Assessment & Plan:   Problem List Items Addressed This Visit     Parkinson's disease (Alpha)    No recent change Cont w/Sinemet      Trigeminal neuralgia of left side of face    New - worsening Rx Given Valtrex, Medrol, Tramadol Records reviewed incl MRI  2018  Potential benefits of a short term opioids use as well as potential risks (i.e. addiction risk, apnea etc) and complications (i.e. Somnolence, constipation and others) were explained to the patient and were aknowledged.  Potential benefits of a short term steroid  use as well as potential risks  and complications were explained to the patient and were aknowledged.           Meds ordered this encounter  Medications   valACYclovir (VALTREX) 1000 MG tablet    Sig: Take 1 tablet (1,000 mg total) by mouth 3 (three) times daily for 10 days.    Dispense:  21 tablet    Refill:  0   methylPREDNISolone (MEDROL DOSEPAK) 4 MG TBPK tablet    Sig: As directed    Dispense:  21 tablet    Refill:  0   traMADol (ULTRAM) 50 MG tablet    Sig: Take 1-2 tablets (50-100 mg total) by mouth every 6 (six) hours as needed for up to 5 days.    Dispense:  40 tablet    Refill:  0      Follow-up: Return in about 2 weeks (around 11/09/2021) for a follow-up visit.  Walker Kehr, MD

## 2021-10-26 NOTE — Assessment & Plan Note (Signed)
No recent change Cont w/Sinemet

## 2021-11-04 DIAGNOSIS — L258 Unspecified contact dermatitis due to other agents: Secondary | ICD-10-CM | POA: Diagnosis not present

## 2021-11-04 DIAGNOSIS — L570 Actinic keratosis: Secondary | ICD-10-CM | POA: Diagnosis not present

## 2021-11-04 DIAGNOSIS — C44619 Basal cell carcinoma of skin of left upper limb, including shoulder: Secondary | ICD-10-CM | POA: Diagnosis not present

## 2021-11-04 DIAGNOSIS — L821 Other seborrheic keratosis: Secondary | ICD-10-CM | POA: Diagnosis not present

## 2021-11-04 DIAGNOSIS — X32XXXD Exposure to sunlight, subsequent encounter: Secondary | ICD-10-CM | POA: Diagnosis not present

## 2021-11-04 DIAGNOSIS — Z1283 Encounter for screening for malignant neoplasm of skin: Secondary | ICD-10-CM | POA: Diagnosis not present

## 2021-12-01 ENCOUNTER — Other Ambulatory Visit: Payer: Self-pay | Admitting: Neurology

## 2021-12-02 ENCOUNTER — Other Ambulatory Visit: Payer: Self-pay

## 2021-12-07 ENCOUNTER — Ambulatory Visit: Payer: Medicare HMO

## 2021-12-08 DIAGNOSIS — N201 Calculus of ureter: Secondary | ICD-10-CM | POA: Diagnosis not present

## 2021-12-08 DIAGNOSIS — N3289 Other specified disorders of bladder: Secondary | ICD-10-CM | POA: Diagnosis not present

## 2021-12-08 DIAGNOSIS — N200 Calculus of kidney: Secondary | ICD-10-CM | POA: Diagnosis not present

## 2021-12-08 DIAGNOSIS — N400 Enlarged prostate without lower urinary tract symptoms: Secondary | ICD-10-CM | POA: Diagnosis not present

## 2021-12-09 ENCOUNTER — Ambulatory Visit (INDEPENDENT_AMBULATORY_CARE_PROVIDER_SITE_OTHER): Payer: Medicare HMO

## 2021-12-09 VITALS — BP 120/70 | HR 69 | Temp 97.7°F | Resp 16 | Ht 67.0 in | Wt 170.8 lb

## 2021-12-09 DIAGNOSIS — Z0000 Encounter for general adult medical examination without abnormal findings: Secondary | ICD-10-CM

## 2021-12-09 NOTE — Patient Instructions (Signed)
Mr. Savoca , ?Thank you for taking time to come for your Medicare Wellness Visit. I appreciate your ongoing commitment to your health goals. Please review the following plan we discussed and let me know if I can assist you in the future.  ? ?Screening recommendations/referrals: ?Colonoscopy: Not a candidate for screening due to age ?Recommended yearly ophthalmology/optometry visit for glaucoma screening and checkup ?Recommended yearly dental visit for hygiene and checkup ? ?Vaccinations: ?Influenza vaccine: 07/10/2021 ?Pneumococcal vaccine: 08/15/2008, 04/04/2017 ?Tdap vaccine: 10/04/2012; due every 10 years ?Shingles vaccine: never done   ?Covid-19: 10/27/2019, 11/21/2019, 07/09/2020 ? ?Advanced directives: Please bring a copy of your health care power of attorney and living will to the office at your convenience. ? ?Conditions/risks identified: Yes ? ?Next appointment: Please schedule your next Medicare Wellness Visit with your Nurse Health Advisor in 1 year or 366 days by calling [phone]. ? ?Preventive Care 67 Years and Older, Male ?Preventive care refers to lifestyle choices and visits with your health care provider that can promote health and wellness. ?What does preventive care include? ?A yearly physical exam. This is also called an annual well check. ?Dental exams once or twice a year. ?Routine eye exams. Ask your health care provider how often you should have your eyes checked. ?Personal lifestyle choices, including: ?Daily care of your teeth and gums. ?Regular physical activity. ?Eating a healthy diet. ?Avoiding tobacco and drug use. ?Limiting alcohol use. ?Practicing safe sex. ?Taking low doses of aspirin every day. ?Taking vitamin and mineral supplements as recommended by your health care provider. ?What happens during an annual well check? ?The services and screenings done by your health care provider during your annual well check will depend on your age, overall health, lifestyle risk factors, and  family history of disease. ?Counseling  ?Your health care provider may ask you questions about your: ?Alcohol use. ?Tobacco use. ?Drug use. ?Emotional well-being. ?Home and relationship well-being. ?Sexual activity. ?Eating habits. ?History of falls. ?Memory and ability to understand (cognition). ?Work and work Statistician. ?Screening  ?You may have the following tests or measurements: ?Height, weight, and BMI. ?Blood pressure. ?Lipid and cholesterol levels. These may be checked every 5 years, or more frequently if you are over 35 years old. ?Skin check. ?Lung cancer screening. You may have this screening every year starting at age 47 if you have a 30-pack-year history of smoking and currently smoke or have quit within the past 15 years. ?Fecal occult blood test (FOBT) of the stool. You may have this test every year starting at age 6. ?Flexible sigmoidoscopy or colonoscopy. You may have a sigmoidoscopy every 5 years or a colonoscopy every 10 years starting at age 83. ?Prostate cancer screening. Recommendations will vary depending on your family history and other risks. ?Hepatitis C blood test. ?Hepatitis B blood test. ?Sexually transmitted disease (STD) testing. ?Diabetes screening. This is done by checking your blood sugar (glucose) after you have not eaten for a while (fasting). You may have this done every 1-3 years. ?Abdominal aortic aneurysm (AAA) screening. You may need this if you are a current or former smoker. ?Osteoporosis. You may be screened starting at age 48 if you are at high risk. ?Talk with your health care provider about your test results, treatment options, and if necessary, the need for more tests. ?Vaccines  ?Your health care provider may recommend certain vaccines, such as: ?Influenza vaccine. This is recommended every year. ?Tetanus, diphtheria, and acellular pertussis (Tdap, Td) vaccine. You may need a Td booster every 10 years. ?  Zoster vaccine. You may need this after age 35. ?Pneumococcal  13-valent conjugate (PCV13) vaccine. One dose is recommended after age 40. ?Pneumococcal polysaccharide (PPSV23) vaccine. One dose is recommended after age 33. ?Talk to your health care provider about which screenings and vaccines you need and how often you need them. ?This information is not intended to replace advice given to you by your health care provider. Make sure you discuss any questions you have with your health care provider. ?Document Released: 09/26/2015 Document Revised: 05/19/2016 Document Reviewed: 07/01/2015 ?Elsevier Interactive Patient Education ? 2017 [employer]. ? ?Fall Prevention in the Home ?Falls can cause injuries. They can happen to people of all ages. There are many things you can do to make your home safe and to help prevent falls. ?What can I do on the outside of my home? ?Regularly fix the edges of walkways and driveways and fix any cracks. ?Remove anything that might make you trip as you walk through a door, such as a raised step or threshold. ?Trim any bushes or trees on the path to your home. ?Use bright outdoor lighting. ?Clear any walking paths of anything that might make someone trip, such as rocks or tools. ?Regularly check to see if handrails are loose or broken. Make sure that both sides of any steps have handrails. ?Any raised decks and porches should have guardrails on the edges. ?Have any leaves, snow, or ice cleared regularly. ?Use sand or salt on walking paths during winter. ?Clean up any spills in your garage right away. This includes oil or grease spills. ?What can I do in the bathroom? ?Use night lights. ?Install grab bars by the toilet and in the tub and shower. Do not use towel bars as grab bars. ?Use non-skid mats or decals in the tub or shower. ?If you need to sit down in the shower, use a plastic, non-slip stool. ?Keep the floor dry. Clean up any water that spills on the floor as soon as it happens. ?Remove soap buildup in the tub or shower regularly. ?Attach  bath mats securely with double-sided non-slip rug tape. ?Do not have throw rugs and other things on the floor that can make you trip. ?What can I do in the bedroom? ?Use night lights. ?Make sure that you have a light by your bed that is easy to reach. ?Do not use any sheets or blankets that are too big for your bed. They should not hang down onto the floor. ?Have a firm chair that has side arms. You can use this for support while you get dressed. ?Do not have throw rugs and other things on the floor that can make you trip. ?What can I do in the kitchen? ?Clean up any spills right away. ?Avoid walking on wet floors. ?Keep items that you use a lot in easy-to-reach places. ?If you need to reach something above you, use a strong step stool that has a grab bar. ?Keep electrical cords out of the way. ?Do not use floor polish or wax that makes floors slippery. If you must use wax, use non-skid floor wax. ?Do not have throw rugs and other things on the floor that can make you trip. ?What can I do with my stairs? ?Do not leave any items on the stairs. ?Make sure that there are handrails on both sides of the stairs and use them. Fix handrails that are broken or loose. Make sure that handrails are as long as the stairways. ?Check any carpeting to make sure that  it is firmly attached to the stairs. Fix any carpet that is loose or worn. ?Avoid having throw rugs at the top or bottom of the stairs. If you do have throw rugs, attach them to the floor with carpet tape. ?Make sure that you have a light switch at the top of the stairs and the bottom of the stairs. If you do not have them, ask someone to add them for you. ?What else can I do to help prevent falls? ?Wear shoes that: ?Do not have high heels. ?Have rubber bottoms. ?Are comfortable and fit you well. ?Are closed at the toe. Do not wear sandals. ?If you use a stepladder: ?Make sure that it is fully opened. Do not climb a closed stepladder. ?Make sure that both sides of the  stepladder are locked into place. ?Ask someone to hold it for you, if possible. ?Clearly mark and make sure that you can see: ?Any grab bars or handrails. ?First and last steps. ?Where the edge of each step i

## 2021-12-09 NOTE — Progress Notes (Signed)
? ?Subjective:  ? William [name] is a 71 y.o. male who presents for Medicare Annual/Subsequent preventive examination. ? ?Review of Systems    ? ?Cardiac Risk Factors include: advanced age (>62mn, >>19women);family history of premature cardiovascular disease;hypertension;male gender ? ?   ?Objective:  ?  ?Today's Vitals  ? 12/09/21 1002  ?BP: 120/70  ?Pulse: 69  ?Resp: 16  ?Temp: 97.7 ?F (36.5 ?C)  ?SpO2: 97%  ?Weight: 170 lb 12.8 oz (77.5 kg)  ?Height: 5' 7"  (1.702 m)  ?PainSc: 0-No pain  ? ?Body mass index is 26.75 kg/m?. ? ? ?  12/09/2021  ? 10:11 AM 10/21/2021  ?  9:26 AM 04/17/2021  ? 12:47 PM 12/05/2020  ?  9:31 AM 10/17/2020  ?  9:01 AM 04/15/2020  ?  8:10 AM 04/30/2019  ?  8:36 AM  ?Advanced Directives  ?Does Patient Have a Medical Advance Directive? Yes Yes Yes Yes Yes Yes Yes  ?Type of Advance Directive Living will;Healthcare Power of Attorney Living will  [place]  [place] of [place]  ?Does patient want to make changes to medical advance directive? No - Patient declined   No - Patient declined     ?Copy of [place] Chart? No - copy requested   No - copy requested     ? ? ?Current Medications (verified) ?Outpatient Encounter Medications as of 12/09/2021  ?Medication Sig  ? carbidopa-levodopa (SINEMET IR) 25-100 MG tablet TAKE 2 TABLETS DAILY AT 7 AM,AND 2 TABLETS AT 11 AM,AND 2 TABLETS AT 4 PM  ? ferrous sulfate 325 (65 FE) MG tablet Take 325 mg by mouth daily with breakfast.  ? NEUPRO 4 MG/24HR APPLY 1 PATCH ONTO THE SKIN EVERY DAY  ? omeprazole (PRILOSEC) 40 MG capsule TAKE 1 CAPSULE BY MOUTH TWICE A DAY  ? psyllium (METAMUCIL) 58.6 % powder Take 1 packet by mouth 2 (two) times daily. 1 TBS Twice Daily.  ? Sildenafil Citrate (VIAGRA PO) Take by mouth. Reported on 11/12/2015  ? methylPREDNISolone (MEDROL DOSEPAK) 4 MG TBPK tablet As directed (Patient not taking: Reported on 12/09/2021)  ? ?No facility-administered  encounter medications on file as of 12/09/2021.  ? ? ?Allergies (verified) ?Patient has no known allergies.  ? ?History: ?Past Medical History:  ?Diagnosis Date  ? Allergic rhinitis   ? Anemia, iron deficiency   ? Arthritis   ? Blood transfusion without reported diagnosis   ? Colonic stricture (HCC)   ? ED (erectile dysfunction)   ? Esophageal stricture   ? Status post upper endoscopy with esophageal dilatation  ? Gastric polyps   ? GERD (gastroesophageal reflux disease)   ? Hiatal hernia   ? Hypertension   ? Hyponatremia   ? Inguinal hernia   ? Kidney stone   ? x 2  ? [place] disease [place]   ? Ulcerative colitis   ? status post total abdominal colectomy with ileoanal anastomosis  ? ?Past Surgical History:  ?Procedure Laterality Date  ? COLECTOMY  1992  ? with ilial anal pull through (for ulcerative colitis)  ? ETHMOIDECTOMY    ? and maxillary enterostomies '04, redo in 12/04  ? INGUINAL HERNIA REPAIR    ? KIDNEY STONE SURGERY    ? removed 2013  ? KNEE ARTHROSCOPY Right 2004  ? LASIK    ? ?Family History  ?Problem Relation Age of Onset  ? Cancer Father   ?     rectal cancer  and MM  ? Rectal cancer Father   ? Dementia Mother   ? Hypertension Mother   ? Heart disease Brother   ?     valve repaired at 55  ? Colon cancer Neg Hx   ? Esophageal cancer Neg Hx   ? Stomach cancer Neg Hx   ? ?Social History  ? ?Socioeconomic History  ? Marital status: Married  ?  Spouse name: Not on file  ? Number of children: 0  ? Years of education: Not on file  ? Highest education level: Bachelor's degree (e.g., BA, AB, BS)  ?Occupational History  ? Occupation: Insurance underwriter  ?  Employer: [employer]  ?Tobacco Use  ? Smoking status: Former  ?  Types: Cigarettes  ?  Quit date: 09/14/1979  ?  Years since quitting: 42.2  ? Smokeless tobacco: Never  ? Tobacco comments:  ?  smoked for 7 years  ?Vaping Use  ? Vaping Use: Never used  ?Substance and Sexual Activity  ? Alcohol use: Yes  ?  Alcohol/week: 0.0 standard drinks  ?  Comment: 2  glasses of wine per night  ? Drug use: No  ? Sexual activity: Yes  ?Other Topics Concern  ? Not on file  ?Social History Narrative  ? [employer] grad  ? Married 13.5 years- divorced; married '05  ? No children  ? Work: Insurance underwriter  ? Patient is a former smoker. Quit in 1981  ? Alcohol use- yes social  ? Daily Caffeine use 2 cups coffee per day  ? Illicit drug use- no  ? Right handed  ? Two story home  ? ?Social Determinants of Health  ? ?Financial Resource Strain: Low Risk   ? Difficulty of Paying Living Expenses: Not hard at all  ?Food Insecurity: No Food Insecurity  ? Worried About Charity fundraiser in the Last Year: Never true  ? Ran Out of Food in the Last Year: Never true  ?Transportation Needs: No Transportation Needs  ? Lack of Transportation (Medical): No  ? Lack of Transportation (Non-Medical): No  ?Physical Activity: Sufficiently Active  ? Days of Exercise per Week: 5 days  ? Minutes of Exercise per Session: 30 min  ?Stress: No Stress Concern Present  ? Feeling of Stress : Not at all  ?Social Connections: Socially Integrated  ? Frequency of Communication with Friends and Family: More than three times a week  ? Frequency of Social Gatherings with Friends and Family: More than three times a week  ? Attends Religious Services: More than 4 times per year  ? Active Member of Clubs or Organizations: Yes  ? Attends Archivist Meetings: More than 4 times per year  ? Marital Status: Married  ? ? ?Tobacco Counseling ?Counseling given: Not Answered ?Tobacco comments: smoked for 7 years ? ? ?Clinical Intake: ? ?Pre-visit preparation completed: Yes ? ?Pain : No/denies pain ?Pain Score: 0-No pain ? ?  ? ?BMI - recorded: 26.75 ?Nutritional Status: BMI 25 -29 Overweight ?Nutritional Risks: None ?Diabetes: No ? ?How often do you need to have someone help you when you read instructions, pamphlets, or other written materials from your doctor or pharmacy?: 1 - Never ?What is the last grade level you completed in  school?: College Degree from [employer] ? ?Diabetic? no ? ?Interpreter Needed?: No ? ?Information entered by :: Lisette Abu, LPN ? ? ?Activities of Daily Living ? ?  12/09/2021  ? 10:25 AM  ?In your present state of health,  do you have any difficulty performing the following activities:  ?Hearing? 0  ?Vision? 0  ?Difficulty concentrating or making decisions? 1  ?Walking or climbing stairs? 0  ?Dressing or bathing? 0  ?Doing errands, shopping? 0  ?Preparing Food and eating ? N  ?Using the Toilet? N  ?In the past six months, have you accidently leaked urine? N  ?Do you have problems with loss of bowel control? N  ?Managing your Medications? N  ?Managing your Finances? N  ?Housekeeping or managing your Housekeeping? N  ? ? ?Patient Care Team: ?Plotnikov, Evie Lacks, MD as PCP - General (Internal Medicine) ?Troy Sine, MD as PCP - Cardiology (Cardiology) ?Irene Shipper, MD as Consulting Physician (Gastroenterology) ?Rivka Spring, MD (Urology) ?Ludwig Clarks, DO as Consulting Physician (Neurology) ?Willow Ora as Geophysical data processor) ? ?Indicate any recent Medical Services you may have received from other than [facility] providers in the past year (date may be approximate). ? ?   ?Assessment:  ? This is a routine wellness examination for Armani. ? ?Hearing/Vision screen ?No results found. ? ?Dietary issues and exercise activities discussed: ?Current Exercise Habits: Home exercise routine, Type of exercise: walking;strength training/weights, Time (Minutes): 30, Frequency (Times/Week): 5, Weekly Exercise (Minutes/Week): 150, Intensity: Moderate, Exercise limited by: neurologic condition(s) ? ? Goals Addressed   ? ?  ?  ?  ?  ? This Visit's Progress  ?  Patient Stated     ?  I am looking forward to turning 71 years old. ?  ? ?  ?Depression Screen ? ?  12/09/2021  ? 10:23 AM 12/05/2020  ?  9:30 AM 06/07/2017  ?  1:10 PM 08/09/2016  ?  8:52 AM  ?PHQ 2/9 Scores  ?PHQ - 2 Score 0 0 0 0  ?  ?Fall Risk ? ?  12/09/2021   ? 10:25 AM 10/21/2021  ?  9:26 AM 04/17/2021  ? 12:56 PM 04/17/2021  ? 12:47 PM 12/05/2020  ?  9:31 AM  ?Fall Risk   ?Falls in the past year? 0 0 0 0 0  ?Number falls in past yr: 0 0 0 0 0  ?Injury with Fall?

## 2021-12-15 DIAGNOSIS — N201 Calculus of ureter: Secondary | ICD-10-CM | POA: Diagnosis not present

## 2021-12-22 DIAGNOSIS — M545 Low back pain, unspecified: Secondary | ICD-10-CM | POA: Insufficient documentation

## 2021-12-23 ENCOUNTER — Other Ambulatory Visit: Payer: Self-pay | Admitting: Urology

## 2021-12-27 ENCOUNTER — Other Ambulatory Visit: Payer: Self-pay | Admitting: Neurology

## 2021-12-27 DIAGNOSIS — G20 Parkinson's disease: Secondary | ICD-10-CM

## 2021-12-28 ENCOUNTER — Encounter (HOSPITAL_BASED_OUTPATIENT_CLINIC_OR_DEPARTMENT_OTHER): Payer: Self-pay | Admitting: Urology

## 2021-12-28 ENCOUNTER — Other Ambulatory Visit: Payer: Self-pay

## 2021-12-28 NOTE — Progress Notes (Signed)
Spoke w/ via phone for pre-op interview--- pt ?Lab needs dos----  no             ?Lab results------ no ?COVID test -----patient states asymptomatic no test needed ?Arrive at ------- 1315 on 12-31-2021 ?NPO after MN NO Solid Food.  Clear liquids from MN until--- 1115 ?Med rec completed ?Medications to take morning of surgery ----- sinemet, prilosec ?Diabetic medication ----- n/a ?Patient instructed no nail polish to be worn day of surgery ?Patient instructed to bring photo id and insurance card day of surgery ?Patient aware to have Driver (ride ) / caregiver for 24 hours after surgery --wife, candice ?Patient Special Instructions ----- n/a ?Pre-Op special Istructions ----- n/a ?Patient verbalized understanding of instructions that were given at this phone interview. ?Patient denies shortness of breath, chest pain, fever, cough at this phone interview.  ?

## 2021-12-29 ENCOUNTER — Other Ambulatory Visit: Payer: Self-pay

## 2021-12-31 ENCOUNTER — Ambulatory Visit (HOSPITAL_BASED_OUTPATIENT_CLINIC_OR_DEPARTMENT_OTHER): Payer: Medicare HMO | Admitting: Anesthesiology

## 2021-12-31 ENCOUNTER — Encounter (HOSPITAL_BASED_OUTPATIENT_CLINIC_OR_DEPARTMENT_OTHER): Payer: Self-pay | Admitting: Urology

## 2021-12-31 ENCOUNTER — Ambulatory Visit (HOSPITAL_BASED_OUTPATIENT_CLINIC_OR_DEPARTMENT_OTHER)
Admission: RE | Admit: 2021-12-31 | Discharge: 2021-12-31 | Disposition: A | Discharge status: Home / Self Care | Payer: Medicare HMO | Attending: Urology | Admitting: Urology

## 2021-12-31 ENCOUNTER — Other Ambulatory Visit: Payer: Self-pay

## 2021-12-31 ENCOUNTER — Encounter (HOSPITAL_BASED_OUTPATIENT_CLINIC_OR_DEPARTMENT_OTHER)
Admission: RE | Disposition: A | Discharge status: Home / Self Care | Payer: Self-pay | Source: Home / Self Care | Attending: Urology

## 2021-12-31 DIAGNOSIS — Z8249 Family history of ischemic heart disease and other diseases of the circulatory system: Secondary | ICD-10-CM | POA: Insufficient documentation

## 2021-12-31 DIAGNOSIS — N201 Calculus of ureter: Secondary | ICD-10-CM | POA: Insufficient documentation

## 2021-12-31 DIAGNOSIS — K219 Gastro-esophageal reflux disease without esophagitis: Secondary | ICD-10-CM | POA: Diagnosis not present

## 2021-12-31 DIAGNOSIS — Z87891 Personal history of nicotine dependence: Secondary | ICD-10-CM | POA: Diagnosis not present

## 2021-12-31 DIAGNOSIS — I10 Essential (primary) hypertension: Secondary | ICD-10-CM | POA: Diagnosis not present

## 2021-12-31 DIAGNOSIS — M1990 Unspecified osteoarthritis, unspecified site: Secondary | ICD-10-CM | POA: Insufficient documentation

## 2021-12-31 HISTORY — DX: Calculus of ureter: N20.1

## 2021-12-31 HISTORY — DX: Induration penis plastica: N48.6

## 2021-12-31 HISTORY — DX: Personal history of other diseases of the digestive system: Z87.19

## 2021-12-31 HISTORY — DX: Personal history of other diseases of the circulatory system: Z86.79

## 2021-12-31 HISTORY — DX: Presence of spectacles and contact lenses: Z97.3

## 2021-12-31 HISTORY — PX: CYSTOSCOPY WITH RETROGRADE PYELOGRAM, URETEROSCOPY AND STENT PLACEMENT: SHX5789

## 2021-12-31 HISTORY — DX: Benign prostatic hyperplasia with lower urinary tract symptoms: N40.1

## 2021-12-31 HISTORY — DX: Personal history of urinary calculi: Z87.442

## 2021-12-31 HISTORY — DX: Disorder of trigeminal nerve, unspecified: G50.9

## 2021-12-31 HISTORY — PX: HOLMIUM LASER APPLICATION: SHX5852

## 2021-12-31 SURGERY — CYSTOURETEROSCOPY, WITH RETROGRADE PYELOGRAM AND STENT INSERTION
Anesthesia: General | Site: Ureter | Laterality: Left

## 2021-12-31 MED ORDER — SODIUM CHLORIDE 0.9 % IR SOLN
Status: DC | PRN
  Administered 2021-12-31: 3000 mL

## 2021-12-31 MED ORDER — IOHEXOL 300 MG/ML  SOLN
INTRAMUSCULAR | Status: DC | PRN
  Administered 2021-12-31: 10 mL via URETHRAL

## 2021-12-31 MED ORDER — PROPOFOL 10 MG/ML IV BOLUS
INTRAVENOUS | Status: AC
Start: 2021-12-31 — End: ?
  Filled 2021-12-31: qty 20

## 2021-12-31 MED ORDER — AMISULPRIDE (ANTIEMETIC) 5 MG/2ML IV SOLN: 10.0000 mg | Freq: Once | INTRAVENOUS | Status: DC | PRN

## 2021-12-31 MED ORDER — PROMETHAZINE HCL 25 MG/ML IJ SOLN: 6.2500 mg | INTRAMUSCULAR | Status: DC | PRN

## 2021-12-31 MED ORDER — MIDAZOLAM HCL 2 MG/2ML IJ SOLN
INTRAMUSCULAR | Status: AC
  Filled 2021-12-31: qty 2

## 2021-12-31 MED ORDER — LIDOCAINE HCL (PF) 2 % IJ SOLN
INTRAMUSCULAR | Status: AC
  Filled 2021-12-31: qty 5

## 2021-12-31 MED ORDER — 0.9 % SODIUM CHLORIDE (POUR BTL) OPTIME
TOPICAL | Status: DC | PRN
  Administered 2021-12-31: 500 mL

## 2021-12-31 MED ORDER — ONDANSETRON HCL 4 MG/2ML IJ SOLN
INTRAMUSCULAR | Status: DC | PRN
  Administered 2021-12-31: 4 mg via INTRAVENOUS

## 2021-12-31 MED ORDER — FENTANYL CITRATE (PF) 100 MCG/2ML IJ SOLN
INTRAMUSCULAR | Status: AC
  Filled 2021-12-31: qty 2

## 2021-12-31 MED ORDER — OXYCODONE HCL 5 MG/5ML PO SOLN: 5.0000 mg | Freq: Once | ORAL | Status: DC | PRN

## 2021-12-31 MED ORDER — LACTATED RINGERS IV SOLN: INTRAVENOUS | Status: DC

## 2021-12-31 MED ORDER — MIDAZOLAM HCL 2 MG/2ML IJ SOLN
INTRAMUSCULAR | Status: DC | PRN
  Administered 2021-12-31: 2 mg via INTRAVENOUS

## 2021-12-31 MED ORDER — FENTANYL CITRATE (PF) 100 MCG/2ML IJ SOLN
INTRAMUSCULAR | Status: DC | PRN
  Administered 2021-12-31 (×2): 50 ug via INTRAVENOUS

## 2021-12-31 MED ORDER — ACETAMINOPHEN 10 MG/ML IV SOLN
INTRAVENOUS | Status: DC | PRN
  Administered 2021-12-31: 1000 mg via INTRAVENOUS

## 2021-12-31 MED ORDER — CEFAZOLIN SODIUM-DEXTROSE 2-4 GM/100ML-% IV SOLN
INTRAVENOUS | Status: AC
  Filled 2021-12-31: qty 100

## 2021-12-31 MED ORDER — CEPHALEXIN 500 MG PO CAPS: 500.0000 mg | ORAL_CAPSULE | Freq: Two times a day (BID) | ORAL | 0 refills | Status: AC

## 2021-12-31 MED ORDER — KETOROLAC TROMETHAMINE 30 MG/ML IJ SOLN
INTRAMUSCULAR | Status: AC
  Filled 2021-12-31: qty 1

## 2021-12-31 MED ORDER — OXYCODONE HCL 5 MG PO TABS: 5.0000 mg | ORAL_TABLET | Freq: Once | ORAL | Status: DC | PRN

## 2021-12-31 MED ORDER — PROPOFOL 10 MG/ML IV BOLUS
INTRAVENOUS | Status: DC | PRN
  Administered 2021-12-31: 150 mg via INTRAVENOUS

## 2021-12-31 MED ORDER — FENTANYL CITRATE (PF) 100 MCG/2ML IJ SOLN
INTRAMUSCULAR | Status: AC
Start: 2021-12-31 — End: ?
  Filled 2021-12-31: qty 2

## 2021-12-31 MED ORDER — KETOROLAC TROMETHAMINE 30 MG/ML IJ SOLN
INTRAMUSCULAR | Status: DC | PRN
  Administered 2021-12-31: 30 mg via INTRAVENOUS

## 2021-12-31 MED ORDER — DEXAMETHASONE SODIUM PHOSPHATE 10 MG/ML IJ SOLN
INTRAMUSCULAR | Status: AC
  Filled 2021-12-31: qty 1

## 2021-12-31 MED ORDER — OXYBUTYNIN CHLORIDE 5 MG PO TABS: 5.0000 mg | ORAL_TABLET | Freq: Three times a day (TID) | ORAL | 0 refills | Status: DC | PRN

## 2021-12-31 MED ORDER — ACETAMINOPHEN 10 MG/ML IV SOLN
INTRAVENOUS | Status: AC
  Filled 2021-12-31: qty 100

## 2021-12-31 MED ORDER — DEXAMETHASONE SODIUM PHOSPHATE 10 MG/ML IJ SOLN
INTRAMUSCULAR | Status: DC | PRN
  Administered 2021-12-31: 4 mg via INTRAVENOUS

## 2021-12-31 MED ORDER — ONDANSETRON HCL 4 MG/2ML IJ SOLN
INTRAMUSCULAR | Status: AC
  Filled 2021-12-31: qty 2

## 2021-12-31 MED ORDER — HYDROMORPHONE HCL 1 MG/ML IJ SOLN: 0.2500 mg | INTRAMUSCULAR | Status: DC | PRN

## 2021-12-31 MED ORDER — LIDOCAINE 2% (20 MG/ML) 5 ML SYRINGE
INTRAMUSCULAR | Status: DC | PRN
  Administered 2021-12-31: 60 mg via INTRAVENOUS

## 2021-12-31 MED ORDER — CEFAZOLIN SODIUM-DEXTROSE 2-4 GM/100ML-% IV SOLN
2.0000 g | INTRAVENOUS | Status: AC
  Administered 2021-12-31: 2 g via INTRAVENOUS

## 2021-12-31 SURGICAL SUPPLY — 23 items
BAG DRAIN URO-CYSTO SKYTR STRL (DRAIN) ×2 IMPLANT
BAG DRN UROCATH (DRAIN) ×1
CATH INTERMIT  6FR 70CM (CATHETERS) ×2 IMPLANT
CLOTH BEACON ORANGE TIMEOUT ST (SAFETY) ×2 IMPLANT
COVER DOME SNAP 22 D (MISCELLANEOUS) ×2 IMPLANT
DRSG TEGADERM 2-3/8X2-3/4 SM (GAUZE/BANDAGES/DRESSINGS) ×1 IMPLANT
DRSG TEGADERM 4X4.75 (GAUZE/BANDAGES/DRESSINGS) ×1 IMPLANT
FIBER LASER FLEXIVA 365 (UROLOGICAL SUPPLIES) ×1 IMPLANT
GLOVE BIO SURGEON STRL SZ8 (GLOVE) ×2 IMPLANT
GLOVE SURG UNDER POLY LF SZ6.5 (GLOVE) ×2 IMPLANT
GOWN STRL REUS W/TWL LRG LVL3 (GOWN DISPOSABLE) ×1 IMPLANT
GOWN STRL REUS W/TWL XL LVL3 (GOWN DISPOSABLE) ×2 IMPLANT
GUIDEWIRE ANG ZIPWIRE 038X150 (WIRE) ×1 IMPLANT
GUIDEWIRE STR DUAL SENSOR (WIRE) ×1 IMPLANT
IV NS IRRIG 3000ML ARTHROMATIC (IV SOLUTION) ×3 IMPLANT
KIT TURNOVER CYSTO (KITS) ×2 IMPLANT
MANIFOLD NEPTUNE II (INSTRUMENTS) ×2 IMPLANT
NS IRRIG 500ML POUR BTL (IV SOLUTION) ×2 IMPLANT
PACK CYSTO (CUSTOM PROCEDURE TRAY) ×2 IMPLANT
SHEATH URETERAL 12FRX35CM (MISCELLANEOUS) ×1 IMPLANT
STENT URET 6FRX24 CONTOUR (STENTS) ×1 IMPLANT
TUBE CONNECTING 12X1/4 (SUCTIONS) ×1 IMPLANT
TUBING UROLOGY SET (TUBING) ×1 IMPLANT

## 2021-12-31 NOTE — Op Note (Signed)
Preoperative diagnosis: 10 mm left distal ureteral stone ? ?Postoperative diagnosis: Same ? ?Principal procedure: Cystoscopy, left retrograde ureteropyelogram, fluoroscopic interpretation, left ureteroscopy, holmium laser lithotripsy of left ureteral calculus, placement of 6 French by 24 cm contour double-J stent with tether ? ?Surgeon: Diona Fanti ? ?Anesthesia: General with LMA ? ?Complications: None ? ?Specimen: None ? ?Indications: 71 year old male with large left distal ureteral stone.  Despite attempts at medical expulsive therapy this has not progressed and the patient has been intermittently symptomatic.  He presents at this time for definitive management of the stone with ureteroscopy and holmium laser.  I discussed the procedure with the patient, expected outcomes as well as risks and complications including the need for temporary stent.  He understands these and desires to proceed. ? ?Findings: Urethra was normal.  Prostate was moderately obstructive.  He did have a fairly large median lobe.  Urothelium of the bladder was normal.  There were mild trabeculations.  Ureteral orifice ease were normal in location-left was slightly golf-hole in nature.  With retrograde study of the left ureter, there was significant colonization of contrast above the stone/filling defect that was just at the ureteral orifice.  There was incomplete filling of the ureter because of this. ? ?Description of procedure: The patient was properly identified and marked in the holding area.  He received preoperative IV antibiotics.  Is taken to the operating room where general anesthetic was administered with the LMA.  He is placed in the dorsolithotomy position.  Genitalia and perineum were prepped, draped, proper timeout performed. ? ?75 French panendoscope was advanced through the urethra into the bladder where circumferential inspection was performed.  Left ureteral orifice was cannulated with somewhat of difficulty due to the  impaction of the stone just proximal to the orifice.  However, retrograde study was performed using a 6 [place] open-ended catheter.  Following this, zip wire was negotiated by the obstructing stone up into the left renal pelvis.  The open-ended catheter was passed a few centimeters over this.  The zip wire was then exchanged for a sensor tip guidewire which was again advanced into the renal pelvis.  The open-ended catheter was removed, with the guidewire left in place.  The bladder was drained.  The scope removed.  I then dilated the orifice first with the obturator and then the entire 11/13 ureteral access catheter.  This was then removed and the 6 French dual-lumen semirigid short ureteroscope was advanced to the urethra, into the bladder and up into the left ureter where the stone was encountered.  Picture was taken.  365 ?m fiber advanced through this and the stone was then powdered using 2 settings of the holmium laser-power of 0.5 and hertz of 50 as well as a power of 1.0 and 30 Hz.  This produced multiple dust like fragments.  These were too small to grasped with a grasper, but I was quite certain they would pass out easily following the procedure.  No large fragments were remaining at this point.  The ureteroscope was removed.  Guidewire backloaded through the cystoscope which was then replaced into the bladder.  A 6 French by 24 cm contour double-J stent was then deployed in the left ureter with excellent proximal and distal curl seen once the guidewire was removed.  Tether was left intact.  The bladder was drained.  It was evident at this point that multiple small fragments were already into the bladder.  Once the scope was removed, the tether was brought out through  the urethra, tied outside the meatus, trimmed and taped to the penis.  At this point, the procedure was terminated.  The patient was awakened, taken to the PACU in stable condition, having tolerated the procedure well. ?

## 2021-12-31 NOTE — Transfer of Care (Signed)
Immediate Anesthesia Transfer of Care Note ? ?Patient: William [name] ? ?Procedure(s) Performed: Procedure(s) (LRB): ?CYSTOSCOPY WITH RETROGRADE PYELOGRAM, FLUOROSCOPIC INTERPRETATION, URETEROSCOPY  AND STENT PLACEMENT (Left) ?HOLMIUM LASER APPLICATION (Left) ? ?Patient Location: PACU ? ?Anesthesia Type: General ? ?Level of Consciousness: awake, alert  and oriented ? ?Airway & Oxygen Therapy: Patient Spontanous Breathing and Patient connected to face mask oxygen ? ?Post-op Assessment: Report given to PACU RN and Post -op Vital signs reviewed and stable ? ?Post vital signs: Reviewed and stable ? ?Complications: No apparent anesthesia complications ? ?Last Vitals:  ?Vitals Value Taken Time  ?BP 160/69 12/31/21 1639  ?Temp 36.4 ?C 12/31/21 1639  ?Pulse 49 12/31/21 1641  ?Resp 13 12/31/21 1641  ?SpO2 100 % 12/31/21 1641  ?Vitals shown include unvalidated device data. ? ?Last Pain:  ?Vitals:  ? 12/31/21 1326  ?TempSrc: Oral  ?   ? ?  ? ?Complications: No notable events documented. ?

## 2021-12-31 NOTE — Anesthesia Procedure Notes (Signed)
Procedure Name: LMA Insertion ?Date/Time: 12/31/2021 2:43 PM ?Performed by: Mechele Claude, CRNA ?Pre-anesthesia Checklist: Patient identified, Emergency Drugs available, Suction available and Patient being monitored ?Patient Re-evaluated:Patient Re-evaluated prior to induction ?Oxygen Delivery Method: Circle system utilized ?Preoxygenation: Pre-oxygenation with 100% oxygen ?Induction Type: IV induction ?Ventilation: Mask ventilation without difficulty ?LMA: LMA inserted ?LMA Size: 4.0 ?Number of attempts: 1 ?Airway Equipment and Method: Bite block ?Placement Confirmation: positive ETCO2 ?Tube secured with: Tape ?Dental Injury: Teeth and Oropharynx as per pre-operative assessment  ? ? ? ? ?

## 2021-12-31 NOTE — Interval H&P Note (Signed)
History and Physical Interval Note: ? ?12/31/2021 ?2:37 PM ? ?William [name]  has presented today for surgery, with the diagnosis of LEFT URETERAL CALCULI.  The various methods of treatment have been discussed with the patient and family. After consideration of risks, benefits and other options for treatment, the patient has consented to  Procedure(s): ?CYSTOSCOPY WITH RETROGRADE PYELOGRAM, URETEROSCOPY , STONE EXTRACTION AND STENT PLACEMENT (Left) ?HOLMIUM LASER APPLICATION (Left) as a surgical intervention.  The patient's history has been reviewed, patient examined, no change in status, stable for surgery.  I have reviewed the patient's chart and labs.  Questions were answered to the patient's satisfaction.   ? ? ?Lillette Boxer Lynnmarie Lovett ? ? ?

## 2021-12-31 NOTE — Anesthesia Postprocedure Evaluation (Signed)
Anesthesia Post Note ? ?Patient: AZARIEL BANIK ? ?Procedure(s) Performed: CYSTOSCOPY WITH RETROGRADE PYELOGRAM, FLUOROSCOPIC INTERPRETATION, URETEROSCOPY  AND STENT PLACEMENT (Left: Ureter) ?HOLMIUM LASER APPLICATION (Left: Ureter) ? ?  ? ?Patient location during evaluation: PACU ?Anesthesia Type: General ?Level of consciousness: awake and alert ?Pain management: pain level controlled ?Vital Signs Assessment: post-procedure vital signs reviewed and stable ?Respiratory status: spontaneous breathing, nonlabored ventilation and respiratory function stable ?Cardiovascular status: blood pressure returned to baseline and stable ?Postop Assessment: no apparent nausea or vomiting ?Anesthetic complications: no ? ? ?No notable events documented. ? ?Last Vitals:  ?Vitals:  ? 12/31/21 1700 12/31/21 1724  ?BP: (!) 139/46 (!) 149/81  ?Pulse: (!) 50 (!) 48  ?Resp: 18 16  ?Temp:  36.4 ?C  ?SpO2: 98% 100%  ?  ?Last Pain:  ?Vitals:  ? 12/31/21 1724  ?TempSrc:   ?PainSc: 0-No pain  ? ? ?  ?  ?  ?  ?  ?  ? ?Lynda Rainwater ? ? ? ? ?

## 2021-12-31 NOTE — Discharge Instructions (Addendum)
You may see some blood in the urine and may have some burning with urination for 48-72 hours. You also may notice that you have to urinate more frequently or urgently after your procedure which is normal.  ?You should call should you develop an inability urinate, fever > 101, persistent nausea and vomiting that prevents you from eating or drinking to stay hydrated.  ?If you have a stent, you will likely urinate more frequently and urgently until the stent is removed and you may experience some discomfort/pain in the lower abdomen and flank especially when urinating. You may take pain medication prescribed to you if needed for pain. You may also intermittently have blood in the urine until the stent is removed.  It is okay to pull the string to remove the stent on Monday morning. ? ? ?Post Anesthesia Home Care Instructions ? ?Activity: ?Get plenty of rest for the remainder of the day. A responsible individual must stay with you for 24 hours following the procedure.  ?For the next 24 hours, DO NOT: ?-Drive a car ?-Paediatric nurse ?-Drink alcoholic beverages ?-Take any medication unless instructed by your physician ?-Make any legal decisions or sign important papers. ? ?Meals: ?Start with liquid foods such as gelatin or soup. Progress to regular foods as tolerated. Avoid greasy, spicy, heavy foods. If nausea and/or vomiting occur, drink only clear liquids until the nausea and/or vomiting subsides. Call your physician if vomiting continues. ? ?Special Instructions/Symptoms: ?Your throat may feel dry or sore from the anesthesia or the breathing tube placed in your throat during surgery. If this causes discomfort, gargle with warm salt water. The discomfort should disappear within 24 hours. ? ?May take Tylenol beginning at 10 PM as needed for pain. ?May take Ibuprofen beginning at 10 PM as needed for pain. ?    ? ? ?

## 2021-12-31 NOTE — Anesthesia Preprocedure Evaluation (Signed)
Anesthesia Evaluation  ?Patient identified by MRN, date of birth, ID band ?Patient awake ? ? ? ?Reviewed: ?Allergy & Precautions, NPO status , Patient's Chart, lab work & pertinent test results ? ?Airway ?Mallampati: II ? ?TM Distance: >3 FB ?Neck ROM: Full ? ? ? Dental ?no notable dental hx. ? ?  ?Pulmonary ?neg pulmonary ROS, former smoker,  ?  ?Pulmonary exam normal ?breath sounds clear to auscultation ? ? ? ? ? ? Cardiovascular ?hypertension, Normal cardiovascular exam ?Rhythm:Regular Rate:Normal ? ? ?  ?Neuro/Psych ? Headaches, negative psych ROS  ? GI/Hepatic ?Neg liver ROS, GERD  ,  ?Endo/Other  ?negative endocrine ROS ? Renal/GU ?negative Renal ROS  ?negative genitourinary ?  ?Musculoskeletal ? ?(+) Arthritis , Osteoarthritis,   ? Abdominal ?  ?Peds ?negative pediatric ROS ?(+)  Hematology ?negative hematology ROS ?(+)   ?Anesthesia Other Findings ? ? Reproductive/Obstetrics ?negative OB ROS ? ?  ? ? ? ? ? ? ? ? ? ? ? ? ? ?  ?  ? ? ? ? ? ? ? ? ?Anesthesia Physical ?Anesthesia Plan ? ?ASA: 2 ? ?Anesthesia Plan: General  ? ?Post-op Pain Management:   ? ?Induction: Intravenous ? ?PONV Risk Score and Plan: 2 and Ondansetron, Midazolam and Treatment may vary due to age or medical condition ? ?Airway Management Planned: LMA ? ?Additional Equipment:  ? ?Intra-op Plan:  ? ?Post-operative Plan: Extubation in OR ? ?Informed Consent: I have reviewed the patients History and Physical, chart, labs and discussed the procedure including the risks, benefits and alternatives for the proposed anesthesia with the patient or authorized representative who has indicated his/her understanding and acceptance.  ? ? ? ?Dental advisory given ? ?Plan Discussed with: CRNA ? ?Anesthesia Plan Comments:   ? ? ? ? ? ? ?Anesthesia Quick Evaluation ? ?

## 2021-12-31 NOTE — H&P (Signed)
H&P ? ?Chief Complaint: Lt kidney stone ? ?History of Present Illness: 71 yo male presents for URS mgmt of a persistently symptomatic, non-progressing 10 mm Lt distal ureteral stone. ? ?Past Medical History:  ?Diagnosis Date  ? Allergic rhinitis   ? Anemia, iron deficiency   ? Arthritis   ? BPH associated with nocturia   ? ED (erectile dysfunction)   ? GERD (gastroesophageal reflux disease)   ? Hiatal hernia   ? History of COVID-19 11/2020  ? per pt moderate symptoms that resovled  ? History of hypertension   ? followed by pcp  (per pt resolved no medication since approx 2017)  ? History of kidney stones   ? Left ureteral calculus   ? Parkinson disease ([place])   ? neurologist--- dr tat;   mild jaw dyskinesia,   gait disorder, tremors  ? Peyronie's disease   ? S/P dilatation of esophageal stricture   ? Trigeminal neuropathy   ? left side  ? Ulcerative colitis   ? followed by dr Henrene Pastor (GI)//   1992 s/p  total abdominal colectomy with ileoanal anastomosis  ? Wears glasses   ? ? ?Past Surgical History:  ?Procedure Laterality Date  ? CYSTOSCOPY W/ URETERAL STENT PLACEMENT  2012  ? for ureteral stricture  ? CYSTOSCOPY/URETEROSCOPY/HOLMIUM LASER/STENT PLACEMENT  10/22/2005  ? @[facility] ;   w/ left ureteral balloon dilatation  ? INGUINAL HERNIA REPAIR    ? per pt unilateral  approxl 1962  ? KNEE ARTHROSCOPY Right 02/21/2002  ? @[facility]   ? NASAL SINUS SURGERY    ? x2  in 2004  ? RECONSTRUCTION TENDON PULLEY HAND Right 05/03/2007  ? @[facility] ;  right long finger  ? TOTAL COLECTOMY  1992  ? W/  ILIEOANAL ANASTOMOSIS (for ilieoanal anatomatic stricture , partial bowel obstruction and required peroidic pneumatic dilatation's)  ? VARICOCELECTOMY    ? ? ?Home Medications:  ?Allergies as of 12/31/2021   ?No Known Allergies ?  ? ?  ?Medication List  ?  ? ? Notice   ?Cannot display discharge medications because the patient has not yet been admitted. ?  ? ? ?Allergies: No Known Allergies ? ?Family History  ?Problem Relation Age of Onset  ? Cancer  Father   ?     rectal cancer and MM  ? Rectal cancer Father   ? Dementia Mother   ? Hypertension Mother   ? Heart disease Brother   ?     valve repaired at 51  ? Colon cancer Neg Hx   ? Esophageal cancer Neg Hx   ? Stomach cancer Neg Hx   ? ? ?Social History:  reports that he quit smoking about 42 years ago. His smoking use included cigarettes. He has never used smokeless tobacco. He reports current alcohol use of about 14.0 standard drinks per week. He reports that he does not use drugs. ? ?ROS: ?A complete review of systems was performed.  All systems are negative except for pertinent findings as noted. ? ?Physical Exam:  ?Vital signs in last 24 hours: ?Ht 5' 7"  (1.702 m)   Wt 75.8 kg   BMI 26.16 kg/m?  ?Constitutional:  Alert and oriented, No acute distress ?Cardiovascular: Regular rate  ?Respiratory: Normal respiratory effort ?Neurologic: Grossly intact, no focal deficits ?Psychiatric: Normal mood and affect ? ?I have reviewed prior pt notes ? ?I have reviewed notes from referring/previous physicians ? ?I have reviewed urinalysis results ? ?I have independently reviewed prior imaging ? ? ? ?Impression/Assessment:  ?10 mm  Lt ureteral stone ? ?Plan:  ?Cysto, Lt RGP, Lt URS w/ HLL/stone extraction, J2 stent ? ?

## 2022-01-01 ENCOUNTER — Encounter (HOSPITAL_BASED_OUTPATIENT_CLINIC_OR_DEPARTMENT_OTHER): Payer: Self-pay | Admitting: Urology

## 2022-01-06 DIAGNOSIS — N201 Calculus of ureter: Secondary | ICD-10-CM | POA: Diagnosis not present

## 2022-01-06 DIAGNOSIS — M5136 Other intervertebral disc degeneration, lumbar region: Secondary | ICD-10-CM | POA: Diagnosis not present

## 2022-01-18 ENCOUNTER — Ambulatory Visit (INDEPENDENT_AMBULATORY_CARE_PROVIDER_SITE_OTHER): Payer: Medicare HMO | Admitting: Internal Medicine

## 2022-01-18 ENCOUNTER — Encounter: Payer: Self-pay | Admitting: Internal Medicine

## 2022-01-18 DIAGNOSIS — G500 Trigeminal neuralgia: Secondary | ICD-10-CM

## 2022-01-18 MED ORDER — PREDNISONE 20 MG PO TABS: 40.0000 mg | ORAL_TABLET | Freq: Every day | ORAL | 0 refills | Status: DC

## 2022-01-18 MED ORDER — GABAPENTIN 100 MG PO CAPS: 100.0000 mg | ORAL_CAPSULE | Freq: Two times a day (BID) | ORAL | 3 refills | Status: DC

## 2022-01-18 NOTE — Patient Instructions (Signed)
We will try the prednisone 2 pills a day for 5 days to see if this helps. ? ?We have also sent in gabapentin which is a nerve pain medicine to try 1 pill twice a day as needed. This is the starting dose and can be increased if this is helping. ? ? ?

## 2022-01-18 NOTE — Assessment & Plan Note (Addendum)
Rx prednisone 5 day course to see if this will help. Rx gabapentin 100 mg BID for pain and can increase as needed. He has tried tramadol previously without much relief. Taking ibuprofen otc with some relief okay to continue that. Prior MRI with likely diagnosis. We talked about if we are unable to medically manage pain there might be utility to updating MRI.  ?

## 2022-01-18 NOTE — Progress Notes (Signed)
? ?  Subjective:  ? ?Patient ID: William [name], male    DOB: 01-25-1951, 71 y.o.   MRN: [id number] ? ?HPI ?The patient is a 71 YO man coming in for ongoing trigeminal nerve pain.  ? ?Review of Systems  ?Constitutional:  Positive for activity change.  ?HENT: Negative.    ?Eyes: Negative.   ?Respiratory:  Negative for cough, chest tightness and shortness of breath.   ?Cardiovascular:  Negative for chest pain, palpitations and leg swelling.  ?Gastrointestinal:  Negative for abdominal distention, abdominal pain, constipation, diarrhea, nausea and vomiting.  ?Musculoskeletal:  Positive for myalgias.  ?Skin: Negative.   ?Neurological: Negative.   ?     Nerve pain left face  ?Psychiatric/Behavioral: Negative.    ? ?Objective:  ?Physical Exam ?Constitutional:   ?   Appearance: He is well-developed.  ?HENT:  ?   Head: Normocephalic and atraumatic.  ?Cardiovascular:  ?   Rate and Rhythm: Normal rate and regular rhythm.  ?Pulmonary:  ?   Effort: Pulmonary effort is normal. No respiratory distress.  ?   Breath sounds: Normal breath sounds. No wheezing or rales.  ?Abdominal:  ?   General: Bowel sounds are normal. There is no distension.  ?   Palpations: Abdomen is soft.  ?   Tenderness: There is no abdominal tenderness. There is no rebound.  ?Musculoskeletal:  ?   Cervical back: Normal range of motion.  ?Skin: ?   General: Skin is warm and dry.  ?   Comments: Pain left V2 distribution no numbness and no facial drooping or weakness.  ?Neurological:  ?   Mental Status: He is alert and oriented to person, place, and time.  ?   Coordination: Coordination normal.  ? ? ?Vitals:  ? 01/18/22 1332  ?BP: 130/80  ?Pulse: (!) 57  ?Resp: 18  ?SpO2: 97%  ?Weight: 174 lb 6.4 oz (79.1 kg)  ?Height: 5' 7"  (1.702 m)  ? ? ?This visit occurred during the SARS-CoV-2 public health emergency.  Safety protocols were in place, including screening questions prior to the visit, additional usage of staff PPE, and extensive cleaning of exam room while  observing appropriate contact time as indicated for disinfecting solutions.  ? ?Assessment & Plan:  ? ?

## 2022-01-19 ENCOUNTER — Telehealth: Payer: Self-pay | Admitting: Internal Medicine

## 2022-01-19 NOTE — Telephone Encounter (Signed)
Pt saw Dr. Sharlet Salina on yesterday.Marland KitchenJohny [name]  ?

## 2022-01-19 NOTE — Telephone Encounter (Signed)
Connected to [facility] 5.6.2023. ? ?Caller states that he has left facial pain - hx of ?trigeminal neuralgia - states feels like electric shocks ?that are very strong and rapid fire today. Just finished ?2 rounds of steroids for this. Rates pain 8/10. Speech ?clear. Denies possible tooth issues. ?

## 2022-01-21 ENCOUNTER — Other Ambulatory Visit: Payer: Self-pay | Admitting: Internal Medicine

## 2022-01-21 DIAGNOSIS — M5451 Vertebrogenic low back pain: Secondary | ICD-10-CM | POA: Diagnosis not present

## 2022-01-24 ENCOUNTER — Encounter: Payer: Self-pay | Admitting: Internal Medicine

## 2022-01-26 DIAGNOSIS — M5451 Vertebrogenic low back pain: Secondary | ICD-10-CM | POA: Diagnosis not present

## 2022-01-28 DIAGNOSIS — M5451 Vertebrogenic low back pain: Secondary | ICD-10-CM | POA: Diagnosis not present

## 2022-02-02 DIAGNOSIS — M5451 Vertebrogenic low back pain: Secondary | ICD-10-CM | POA: Diagnosis not present

## 2022-02-04 DIAGNOSIS — M5451 Vertebrogenic low back pain: Secondary | ICD-10-CM | POA: Diagnosis not present

## 2022-02-08 ENCOUNTER — Encounter: Payer: Self-pay | Admitting: Internal Medicine

## 2022-02-09 DIAGNOSIS — M5451 Vertebrogenic low back pain: Secondary | ICD-10-CM | POA: Diagnosis not present

## 2022-02-09 NOTE — Telephone Encounter (Signed)
Are you talking about this Friday 02/12/22... You are schedule off w/ no patients../l,mb

## 2022-02-11 ENCOUNTER — Other Ambulatory Visit: Payer: Self-pay | Admitting: Internal Medicine

## 2022-02-11 DIAGNOSIS — M5451 Vertebrogenic low back pain: Secondary | ICD-10-CM | POA: Diagnosis not present

## 2022-02-11 MED ORDER — TRAMADOL HCL 50 MG PO TABS: 50.0000 mg | ORAL_TABLET | Freq: Four times a day (QID) | ORAL | 1 refills | Status: AC | PRN

## 2022-02-12 ENCOUNTER — Encounter: Payer: Self-pay | Admitting: Internal Medicine

## 2022-02-12 ENCOUNTER — Ambulatory Visit (INDEPENDENT_AMBULATORY_CARE_PROVIDER_SITE_OTHER): Payer: Medicare HMO | Admitting: Internal Medicine

## 2022-02-12 DIAGNOSIS — G20 Parkinson's disease: Secondary | ICD-10-CM

## 2022-02-12 DIAGNOSIS — G500 Trigeminal neuralgia: Secondary | ICD-10-CM | POA: Diagnosis not present

## 2022-02-12 DIAGNOSIS — N202 Calculus of kidney with calculus of ureter: Secondary | ICD-10-CM | POA: Diagnosis not present

## 2022-02-12 MED ORDER — CARBAMAZEPINE 200 MG PO TABS: 100.0000 mg | ORAL_TABLET | Freq: Three times a day (TID) | ORAL | 2 refills | Status: DC

## 2022-02-12 MED ORDER — BACLOFEN 10 MG PO TABS: 10.0000 mg | ORAL_TABLET | Freq: Three times a day (TID) | ORAL | 2 refills | Status: DC

## 2022-02-12 NOTE — Assessment & Plan Note (Signed)
We prescribed baclofen for trigeminal neuralgia.  May help with muscle spasms as well

## 2022-02-12 NOTE — Progress Notes (Signed)
Subjective:  Patient ID: William [name], male    DOB: 08/22/1951  Age: 71 y.o. MRN: [id number]  CC: No chief complaint on file.   HPI William [name] presents for trigeminal neuralgia - L side.  It started in February and has been progressing.  Gabapentin did not touch it.  Tramadol helps some. Now William [name] is having left cheek pain with pulses of pain several times an hour.  Each episode can contain from 2-20 or 30 pulses of pain.  There is no eye redness, eye pain or tearing.  This has been going on every day and every night.  Outpatient Medications Prior to Visit  Medication Sig Dispense Refill   carbidopa-levodopa (SINEMET IR) 25-100 MG tablet TAKE 2 TABLETS DAILY AT 7 AM,AND 2 TABLETS AT 11 AM,AND 2 TABLETS AT 4 PM 540 tablet 0   ferrous sulfate 325 (65 FE) MG tablet Take 325 mg by mouth daily with breakfast.     gabapentin (NEURONTIN) 100 MG capsule Take 1 capsule (100 mg total) by mouth 2 (two) times daily. 60 capsule 3   NEUPRO 4 MG/24HR APPLY 1 PATCH ONTO THE SKIN EVERY DAY (Patient taking differently: 1 patch daily.) 90 patch 1   omeprazole (PRILOSEC) 40 MG capsule TAKE 1 CAPSULE BY MOUTH TWICE A DAY 180 capsule 0   predniSONE (DELTASONE) 20 MG tablet Take 2 tablets (40 mg total) by mouth daily with breakfast. 10 tablet 0   psyllium (METAMUCIL) 58.6 % powder Take 1 packet by mouth 2 (two) times daily. 1 TBS Twice Daily.     sildenafil (REVATIO) 20 MG tablet Take 20 mg by mouth as needed (1-5 tabs for ed).     traMADol (ULTRAM) 50 MG tablet Take 1-2 tablets (50-100 mg total) by mouth every 6 (six) hours as needed for up to 5 days. 40 tablet 1   No facility-administered medications prior to visit.    ROS: Review of Systems  Constitutional:  Positive for fatigue. Negative for appetite change and unexpected weight change.  HENT:  Negative for congestion, nosebleeds, sneezing, sore throat and trouble swallowing.   Eyes:  Negative for photophobia, pain, redness, itching and visual  disturbance.  Respiratory:  Negative for cough.   Cardiovascular:  Negative for chest pain, palpitations and leg swelling.  Gastrointestinal:  Negative for abdominal distention, blood in stool, diarrhea and nausea.  Genitourinary:  Negative for frequency and hematuria.  Musculoskeletal:  Negative for back pain, gait problem, joint swelling and neck pain.  Skin:  Negative for rash.  Neurological:  Positive for tremors and headaches. Negative for dizziness, facial asymmetry, speech difficulty, weakness and light-headedness.  Psychiatric/Behavioral:  Negative for agitation, dysphoric mood and sleep disturbance. The patient is not nervous/anxious.    Objective:  BP 136/76 (BP Location: Left Arm, Patient Position: Sitting, Cuff Size: Large)   Pulse 62   Temp 98 F (36.7 C) (Oral)   Ht 5' 7"  (1.702 m)   Wt 178 lb (80.7 kg)   SpO2 97%   BMI 27.88 kg/m   BP Readings from Last 3 Encounters:  02/12/22 136/76  01/18/22 130/80  12/31/21 (!) 149/81    Wt Readings from Last 3 Encounters:  02/12/22 178 lb (80.7 kg)  01/18/22 174 lb 6.4 oz (79.1 kg)  12/31/21 168 lb 12.8 oz (76.6 kg)    Physical Exam Constitutional:      General: He is not in acute distress.    Appearance: Normal appearance. He is well-developed.  Comments: NAD  Eyes:     Conjunctiva/sclera: Conjunctivae normal.     Pupils: Pupils are equal, round, and reactive to light.  Neck:     Thyroid: No thyromegaly.     Vascular: No JVD.  Cardiovascular:     Rate and Rhythm: Normal rate and regular rhythm.     Heart sounds: Normal heart sounds. No murmur heard.   No friction rub. No gallop.  Pulmonary:     Effort: Pulmonary effort is normal. No respiratory distress.     Breath sounds: Normal breath sounds. No wheezing or rales.  Chest:     Chest wall: No tenderness.  Abdominal:     General: Bowel sounds are normal. There is no distension.     Palpations: Abdomen is soft. There is no mass.     Tenderness: There is no  abdominal tenderness. There is no guarding or rebound.  Musculoskeletal:        General: No tenderness. Normal range of motion.     Cervical back: Normal range of motion.     Right lower leg: No edema.     Left lower leg: No edema.  Lymphadenopathy:     Cervical: No cervical adenopathy.  Skin:    General: Skin is warm and dry.     Findings: No bruising or rash.  Neurological:     Mental Status: He is alert and oriented to person, place, and time.     Cranial Nerves: No cranial nerve deficit.     Motor: No abnormal muscle tone.     Coordination: Coordination abnormal.     Gait: Gait normal.     Deep Tendon Reflexes: Reflexes are normal and symmetric.  Psychiatric:        Behavior: Behavior normal.        Thought Content: Thought content normal.        Judgment: Judgment normal.  No pulsating vessels on the temples Neuro exam is nonfocal Mild tremor, rigidity, increased muscle tone No rash  Lab Results  Component Value Date   WBC 3.4 (L) 04/15/2020   HGB 13.5 04/15/2020   HCT 39.6 04/15/2020   PLT 185.0 04/15/2020   GLUCOSE 104 (H) 04/15/2020   CHOL 157 04/06/2017   TRIG 55.0 04/06/2017   HDL 69.70 04/06/2017   LDLCALC 76 04/06/2017   ALT 17 04/15/2020   AST 24 04/15/2020   NA 131 (L) 04/15/2020   K 4.4 04/15/2020   CL 102 04/15/2020   CREATININE 1.13 04/15/2020   BUN 24 (H) 04/15/2020   CO2 25 04/15/2020   TSH 2.40 11/28/2018   PSA 0.98 04/06/2017    No results found.  Assessment & Plan:   Problem List Items Addressed This Visit     Parkinson's disease ([place])    We prescribed baclofen for trigeminal neuralgia.  May help with muscle spasms as well       Relevant Medications   carbamazepine (TEGRETOL) 200 MG tablet   Trigeminal neuralgia of left side of face    Refractory Gababentin - d/c did not help Continue Tramadol Start Carbamazepine and Baclofen  Potential benefits of a long term use of above meds  use as well as potential risks  and complications  were explained to the patient and were aknowledged. We prescribed baclofen for trigeminal neuralgia.  May help with muscle spasms as well         Relevant Medications   carbamazepine (TEGRETOL) 200 MG tablet   baclofen (LIORESAL) 10 MG tablet  Meds ordered this encounter  Medications   carbamazepine (TEGRETOL) 200 MG tablet    Sig: Take 0.5-1 tablets (100-200 mg total) by mouth 3 (three) times daily.    Dispense:  180 tablet    Refill:  2   baclofen (LIORESAL) 10 MG tablet    Sig: Take 1 tablet (10 mg total) by mouth 3 (three) times daily.    Dispense:  90 each    Refill:  2      Follow-up: Return in about 2 weeks (around 02/26/2022) for a follow-up visit.  Walker Kehr, MD

## 2022-02-12 NOTE — Assessment & Plan Note (Addendum)
Refractory Gababentin - d/c did not help Continue Tramadol Start Carbamazepine and Baclofen  Potential benefits of a long term use of above meds  use as well as potential risks  and complications were explained to the patient and were aknowledged. We prescribed baclofen for trigeminal neuralgia.  May help with muscle spasms as well

## 2022-02-16 DIAGNOSIS — M5451 Vertebrogenic low back pain: Secondary | ICD-10-CM | POA: Diagnosis not present

## 2022-02-18 DIAGNOSIS — M5451 Vertebrogenic low back pain: Secondary | ICD-10-CM | POA: Diagnosis not present

## 2022-02-22 ENCOUNTER — Ambulatory Visit: Payer: Medicare HMO | Admitting: Internal Medicine

## 2022-03-03 ENCOUNTER — Ambulatory Visit: Payer: Medicare HMO | Admitting: Internal Medicine

## 2022-03-08 DIAGNOSIS — M5136 Other intervertebral disc degeneration, lumbar region: Secondary | ICD-10-CM | POA: Diagnosis not present

## 2022-03-08 DIAGNOSIS — M545 Low back pain, unspecified: Secondary | ICD-10-CM | POA: Diagnosis not present

## 2022-03-11 ENCOUNTER — Ambulatory Visit (INDEPENDENT_AMBULATORY_CARE_PROVIDER_SITE_OTHER): Payer: Medicare HMO | Admitting: Internal Medicine

## 2022-03-11 ENCOUNTER — Encounter: Payer: Self-pay | Admitting: Internal Medicine

## 2022-03-11 DIAGNOSIS — G20 Parkinson's disease: Secondary | ICD-10-CM

## 2022-03-11 DIAGNOSIS — G500 Trigeminal neuralgia: Secondary | ICD-10-CM | POA: Diagnosis not present

## 2022-03-11 MED ORDER — TRAMADOL HCL 50 MG PO TABS: 50.0000 mg | ORAL_TABLET | Freq: Four times a day (QID) | ORAL | 1 refills | Status: DC | PRN

## 2022-03-11 NOTE — Patient Instructions (Addendum)
Blue-Emu cream use 3-4 times a day Rice heating pad - a sock See your dentist

## 2022-03-11 NOTE — Assessment & Plan Note (Signed)
F/u w/Dr Tat Cont w/Sinemet

## 2022-03-11 NOTE — Progress Notes (Signed)
Subjective:  Patient ID: William [name], male    DOB: Jan 10, 1951  Age: 71 y.o. MRN: [id number]  CC: No chief complaint on file.   HPI William [name] presents for L trigeminal neuralgia - not better   Outpatient Medications Prior to Visit  Medication Sig Dispense Refill   baclofen (LIORESAL) 10 MG tablet Take 1 tablet (10 mg total) by mouth 3 (three) times daily. 90 each 2   carbamazepine (TEGRETOL) 200 MG tablet Take 0.5-1 tablets (100-200 mg total) by mouth 3 (three) times daily. 180 tablet 2   carbidopa-levodopa (SINEMET IR) 25-100 MG tablet TAKE 2 TABLETS DAILY AT 7 AM,AND 2 TABLETS AT 11 AM,AND 2 TABLETS AT 4 PM 540 tablet 0   ferrous sulfate 325 (65 FE) MG tablet Take 325 mg by mouth daily with breakfast.     NEUPRO 4 MG/24HR APPLY 1 PATCH ONTO THE SKIN EVERY DAY (Patient taking differently: 1 patch daily.) 90 patch 1   omeprazole (PRILOSEC) 40 MG capsule TAKE 1 CAPSULE BY MOUTH TWICE A DAY 180 capsule 0   psyllium (METAMUCIL) 58.6 % powder Take 1 packet by mouth 2 (two) times daily. 1 TBS Twice Daily.     sildenafil (REVATIO) 20 MG tablet Take 20 mg by mouth as needed (1-5 tabs for ed).     gabapentin (NEURONTIN) 100 MG capsule Take 1 capsule (100 mg total) by mouth 2 (two) times daily. 60 capsule 3   predniSONE (DELTASONE) 20 MG tablet Take 2 tablets (40 mg total) by mouth daily with breakfast. 10 tablet 0   No facility-administered medications prior to visit.    ROS: Review of Systems  Constitutional:  Negative for appetite change, fatigue and unexpected weight change.  HENT:  Negative for congestion, nosebleeds, sneezing, sore throat and trouble swallowing.   Eyes:  Negative for itching and visual disturbance.  Respiratory:  Negative for cough.   Cardiovascular:  Negative for chest pain, palpitations and leg swelling.  Gastrointestinal:  Negative for abdominal distention, blood in stool, diarrhea and nausea.  Genitourinary:  Negative for frequency and hematuria.   Musculoskeletal:  Negative for back pain, gait problem, joint swelling and neck pain.  Skin:  Negative for rash.  Neurological:  Positive for headaches. Negative for dizziness, tremors, speech difficulty, weakness and light-headedness.  Psychiatric/Behavioral:  Negative for agitation, dysphoric mood, sleep disturbance and suicidal ideas. The patient is not nervous/anxious.     Objective:  BP 130/80 (BP Location: Left Arm, Patient Position: Sitting, Cuff Size: Normal)   Pulse 62   Temp 98 F (36.7 C) (Oral)   Ht 5' 7"  (1.702 m)   Wt 176 lb (79.8 kg)   SpO2 97%   BMI 27.57 kg/m   BP Readings from Last 3 Encounters:  03/11/22 130/80  02/12/22 136/76  01/18/22 130/80    Wt Readings from Last 3 Encounters:  03/11/22 176 lb (79.8 kg)  02/12/22 178 lb (80.7 kg)  01/18/22 174 lb 6.4 oz (79.1 kg)    Physical Exam Constitutional:      General: He is not in acute distress.    Appearance: He is well-developed.     Comments: NAD  Eyes:     Conjunctiva/sclera: Conjunctivae normal.     Pupils: Pupils are equal, round, and reactive to light.  Neck:     Thyroid: No thyromegaly.     Vascular: No JVD.  Cardiovascular:     Rate and Rhythm: Normal rate and regular rhythm.     Heart sounds:  Normal heart sounds. No murmur heard.    No friction rub. No gallop.  Pulmonary:     Effort: Pulmonary effort is normal. No respiratory distress.     Breath sounds: Normal breath sounds. No wheezing or rales.  Chest:     Chest wall: No tenderness.  Abdominal:     General: Bowel sounds are normal. There is no distension.     Palpations: Abdomen is soft. There is no mass.     Tenderness: There is no abdominal tenderness. There is no guarding or rebound.  Musculoskeletal:        General: No tenderness. Normal range of motion.     Cervical back: Normal range of motion.  Lymphadenopathy:     Cervical: No cervical adenopathy.  Skin:    General: Skin is warm and dry.     Findings: No rash.   Neurological:     Mental Status: He is alert and oriented to person, place, and time.     Cranial Nerves: No cranial nerve deficit.     Motor: No abnormal muscle tone.     Coordination: Coordination normal.     Gait: Gait normal.     Deep Tendon Reflexes: Reflexes are normal and symmetric.  Psychiatric:        Behavior: Behavior normal.        Thought Content: Thought content normal.        Judgment: Judgment normal.   L cheek is sensitive  Lab Results  Component Value Date   WBC 3.4 (L) 04/15/2020   HGB 13.5 04/15/2020   HCT 39.6 04/15/2020   PLT 185.0 04/15/2020   GLUCOSE 104 (H) 04/15/2020   CHOL 157 04/06/2017   TRIG 55.0 04/06/2017   HDL 69.70 04/06/2017   LDLCALC 76 04/06/2017   ALT 17 04/15/2020   AST 24 04/15/2020   NA 131 (L) 04/15/2020   K 4.4 04/15/2020   CL 102 04/15/2020   CREATININE 1.13 04/15/2020   BUN 24 (H) 04/15/2020   CO2 25 04/15/2020   TSH 2.40 11/28/2018   PSA 0.98 04/06/2017    No results found.  Assessment & Plan:   Problem List Items Addressed This Visit     Parkinson's disease ([place])    F/u w/Dr Tat Cont w/Sinemet      Trigeminal neuralgia of left side of face    Appt w/Dr Tat is pending Blue-Emu cream was recommended to use 2-3 times a day Tramadol -- not helping at 50 mg. Try 100 mg bid-qid prn Carbamazepine and Baclofen  -- not helping Blue-Emu cream use 3-4 times a day Rice heating pad - a sock See your dentist Appt w/Dr Tat -- soon         Meds ordered this encounter  Medications   traMADol (ULTRAM) 50 MG tablet    Sig: Take 1-2 tablets (50-100 mg total) by mouth every 6 (six) hours as needed.    Dispense:  120 tablet    Refill:  1      Follow-up: Return in about 3 months (around 06/11/2022) for a follow-up visit.  Walker Kehr, MD

## 2022-03-11 NOTE — Assessment & Plan Note (Addendum)
Appt w/Dr Tat is pending Blue-Emu cream was recommended to use 2-3 times a day Tramadol -- not helping at 50 mg. Try 100 mg bid-qid prn Carbamazepine and Baclofen  -- not helping Blue-Emu cream use 3-4 times a day Rice heating pad - a sock See your dentist Appt w/Dr Tat -- soon

## 2022-03-17 DIAGNOSIS — M545 Low back pain, unspecified: Secondary | ICD-10-CM | POA: Diagnosis not present

## 2022-03-29 NOTE — Progress Notes (Unsigned)
Assessment/Plan:   1.  Parkinsons Disease  -looks worse today.  May be because of worsening back pain and cannot exercise  -continue carbidopa/levodopa 25/100, 2 tablets at 7 AM, 2 tablets at 11 AM, 2 tablets at 4 PM.  -He does get some skin reaction with the rotigotine.  We talked about switching to ropinirole, but ultimately he really would like to stay with rotigotine.  He is already using the Flonase prior to applying the patch.   -increase neupro 6 mg daily  -handicap placard filled out   2.  Mild jaw dyskinesia  -Not bothersome to patient.  He doesn't want amantadine right now 3.  [facility]  -Last neurocognitive testing in 2019.  I do not think anything clinically has significantly changed since that time.  4.  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  He saw Dr. Christella Noa in March, 2018 and surgical decompression was offered.  Patient subsequently had second opinion with Dr. Vertell Limber, but pain then subsided so he held on that.  Obviously, pain has returned.  I have concerns about his carbamazepine, as he was on Trileptal in the past and hyponatremia with that.  -refer back to neurosx once mri complete  -repeat MRI trigeminal  -start lyrica 75 mg bid.  R/B/SE were discussed.  The opportunity to ask questions was given and they were answered to the best of my ability.  The patient expressed understanding and willingness to follow the outlined treatment protocols.  Has tried and failed carbamazepine, baclofen, robaxin, tramadol, emla, gabapentin.  5.  LBP  -had mri at [facility].  I don't have results  -he has f/u tomorrow at [facility]  -limiting ability to exercise Subjective:   William [name] was seen today in follow up for Parkinsons disease.  My previous records were reviewed prior to todays visit as well as outside records available to me.  Patient is with his wife who  supplements the history.  Patient is on rotigotine and levodopa.  He has had no compulsive behaviors or sleep attacks.  He most certainly has better and worse days.  He has had no falls.  No hallucinations.  He does have some dyskinesia.  Primary care records are reviewed.  Not long after our last visit, he went to primary care complaining about pain on the left side of his face.  He was initially started on Valtrex, Medrol and tramadol and diagnosed with trigeminal neuralgia.  In May, he was prescribed gabapentin for the same pain, 100 mg twice daily, which did not help.  He was then started on carbamazepine at the end of May.  He was told to take up to 200 mg 3 times per day.  The patient experienced some drowsiness and unsteadiness and is not taking that now.  Baclofen was also prescribed up to 10 mg 3 times per day, but the patient only took it at night and it didn't help and it was d/c.  He was last seen by primary care June 29 for this.  He was recommended to try Emla cream 2-3 times per day and increase his tramadol to 100 mg twice daily to 4 times daily.   He states he doesn't like to take it b/c it constipates him so he is only taking ibuprofen.  Emla gives minor relief.   He was told to see his dentist as well.  Patient  has previously had MRI face with trigeminal protocol for same thing back in 2018 with a crossing vessel near the left trigeminal nerve root.  He also states he has been having significant back pain.  He just had an MRI lumbar spine at [facility] and has an appt tomoroww for f/u.    Current prescribed movement disorder medications: Carbidopa/levodopa 25/100,2 tablets at 7 AM, 2 tablets at 11 AM, 2 tablets at 4 PM.   Rotigotine patch, 4 mg daily  Prior meds:  pramipexole er (switched due to a near syncopal episode after drink of alcohol)   ALLERGIES:  No Known Allergies  CURRENT MEDICATIONS:  Outpatient Encounter Medications as of 03/30/2022  Medication Sig   carbidopa-levodopa  (SINEMET IR) 25-100 MG tablet TAKE 2 TABLETS DAILY AT 7 AM,AND 2 TABLETS AT 11 AM,AND 2 TABLETS AT 4 PM   ferrous sulfate 325 (65 FE) MG tablet Take 325 mg by mouth daily with breakfast.   NEUPRO 4 MG/24HR APPLY 1 PATCH ONTO THE SKIN EVERY DAY (Patient taking differently: 1 patch daily.)   omeprazole (PRILOSEC) 40 MG capsule TAKE 1 CAPSULE BY MOUTH TWICE A DAY   psyllium (METAMUCIL) 58.6 % powder Take 1 packet by mouth 2 (two) times daily. 1 TBS Twice Daily.   sildenafil (REVATIO) 20 MG tablet Take 20 mg by mouth as needed (1-5 tabs for ed).   traMADol (ULTRAM) 50 MG tablet Take 1-2 tablets (50-100 mg total) by mouth every 6 (six) hours as needed.   baclofen (LIORESAL) 10 MG tablet Take 1 tablet (10 mg total) by mouth 3 (three) times daily. (Patient not taking: Reported on 03/30/2022)   carbamazepine (TEGRETOL) 200 MG tablet Take 0.5-1 tablets (100-200 mg total) by mouth 3 (three) times daily. (Patient not taking: Reported on 03/30/2022)   No facility-administered encounter medications on file as of 03/30/2022.    Objective:   PHYSICAL EXAMINATION:    VITALS:   Vitals:   03/30/22 0908  BP: (!) 144/86  Pulse: 62  SpO2: 95%  Weight: 176 lb (79.8 kg)  Height: 5' 7"  (1.702 m)       GEN:  The patient appears stated age and is in NAD. HEENT:  Normocephalic, atraumatic.  The mucous membranes are moist. The superficial temporal arteries are without ropiness or tenderness.  Some drooling CV:  RRR  Lungs :  ctab   Neurological examination:  Orientation: The patient is alert and oriented x3. Cranial nerves: There is good facial symmetry with mild facial hypomimia. The speech is fluent and clear. Soft palate rises symmetrically and there is no tongue deviation. Hearing is intact to conversational tone. Sensation: Sensation is intact to light touch throughout Motor: Strength is at least antigravity x4.  Movement examination: Tone: There is mild increased tone in the bilateral UE Abnormal  movements: None Coordination:  There is slowness of RAMs, esp with finger taps on the R and toe taps on the L (relates to back issues) Gait and Station: The patient ambulates fairly well but reports back pain with ambulation    I have reviewed and interpreted the following labs independently    Chemistry      Component Value Date/Time   NA 131 (L) 04/15/2020 0851   K 4.4 04/15/2020 0851   CL 102 04/15/2020 0851   CO2 25 04/15/2020 0851   BUN 24 (H) 04/15/2020 0851   CREATININE 1.13 04/15/2020 0851   CREATININE 1.07 06/27/2018 0915      Component Value Date/Time  CALCIUM 9.5 04/15/2020 0851   ALKPHOS 37 (L) 04/15/2020 0851   AST 24 04/15/2020 0851   ALT 17 04/15/2020 0851   BILITOT 0.6 04/15/2020 0851       Lab Results  Component Value Date   WBC 3.4 (L) 04/15/2020   HGB 13.5 04/15/2020   HCT 39.6 04/15/2020   MCV 98.4 04/15/2020   PLT 185.0 04/15/2020    Lab Results  Component Value Date   TSH 2.40 11/28/2018   Total time spent on today's visit was 32 minutes, including both face-to-face time and nonface-to-face time.  Time included that spent on review of records (prior notes available to me/labs/imaging if pertinent), discussing treatment and goals, answering patient's questions and coordinating care.    Cc:  Plotnikov, Evie Lacks, MD

## 2022-03-30 ENCOUNTER — Other Ambulatory Visit: Payer: Self-pay

## 2022-03-30 ENCOUNTER — Ambulatory Visit: Payer: Medicare HMO | Admitting: Neurology

## 2022-03-30 ENCOUNTER — Encounter: Payer: Self-pay | Admitting: Neurology

## 2022-03-30 VITALS — BP 144/86 | HR 62 | Ht 67.0 in | Wt 176.0 lb

## 2022-03-30 DIAGNOSIS — G500 Trigeminal neuralgia: Secondary | ICD-10-CM

## 2022-03-30 DIAGNOSIS — G20 Parkinson's disease: Secondary | ICD-10-CM

## 2022-03-30 DIAGNOSIS — M545 Low back pain, unspecified: Secondary | ICD-10-CM

## 2022-03-30 MED ORDER — PREGABALIN 75 MG PO CAPS: 75.0000 mg | ORAL_CAPSULE | Freq: Two times a day (BID) | ORAL | 1 refills | Status: DC

## 2022-03-30 MED ORDER — NEUPRO 6 MG/24HR TD PT24
1.0000 | MEDICATED_PATCH | Freq: Every day | TRANSDERMAL | 1 refills | Status: DC
Start: 2022-03-30 — End: 2022-08-04

## 2022-03-31 ENCOUNTER — Telehealth (HOSPITAL_COMMUNITY): Payer: Self-pay | Admitting: Pharmacy Technician

## 2022-03-31 DIAGNOSIS — M5136 Other intervertebral disc degeneration, lumbar region: Secondary | ICD-10-CM | POA: Diagnosis not present

## 2022-03-31 DIAGNOSIS — M48061 Spinal stenosis, lumbar region without neurogenic claudication: Secondary | ICD-10-CM | POA: Diagnosis not present

## 2022-03-31 NOTE — Telephone Encounter (Signed)
Patient Advocate Encounter   Received notification that prior authorization for Pregabalin 75MG capsules is required.   PA submitted on 03/31/2022 Key [id number] Status is pending       Lyndel Safe, [place] Patient Advocate Specialist [place] Patient Advocate Team Direct Number: [phone]  Fax: [phone]

## 2022-03-31 NOTE — Telephone Encounter (Signed)
Patient Advocate Encounter  Prior Authorization for Pregabalin 75MG capsules has been approved.    PA# [id number] Effective dates: 03/31/2022 through 09/12/2022      Lyndel Safe, [place] Patient Advocate Specialist [place] Patient Advocate Team Direct Number: [phone]  Fax: [phone]

## 2022-04-01 ENCOUNTER — Other Ambulatory Visit: Payer: Self-pay | Admitting: Neurology

## 2022-04-01 DIAGNOSIS — G20 Parkinson's disease: Secondary | ICD-10-CM

## 2022-04-02 ENCOUNTER — Telehealth: Payer: Self-pay | Admitting: Neurology

## 2022-04-02 NOTE — Telephone Encounter (Signed)
Pt called and left detailed message. I called [facility] and they reported that they do have the medication. Please call back if needed.

## 2022-04-02 NOTE — Telephone Encounter (Signed)
Patient called and said he is out of his carb/levo Rx and the pharmacy is out too until Monday.  He'd like to know if the office has any samples to share? Or, what should he do?

## 2022-04-08 ENCOUNTER — Other Ambulatory Visit: Payer: Medicare HMO

## 2022-04-08 DIAGNOSIS — M5416 Radiculopathy, lumbar region: Secondary | ICD-10-CM | POA: Diagnosis not present

## 2022-04-11 ENCOUNTER — Ambulatory Visit
Admission: RE | Admit: 2022-04-11 | Discharge: 2022-04-11 | Disposition: A | Discharge status: Home / Self Care | Payer: Medicare HMO | Source: Ambulatory Visit | Attending: Neurology | Admitting: Neurology

## 2022-04-11 DIAGNOSIS — J320 Chronic maxillary sinusitis: Secondary | ICD-10-CM | POA: Diagnosis not present

## 2022-04-11 DIAGNOSIS — G500 Trigeminal neuralgia: Secondary | ICD-10-CM

## 2022-04-11 MED ORDER — GADOBENATE DIMEGLUMINE 529 MG/ML IV SOLN
15.0000 mL | Freq: Once | INTRAVENOUS | Status: AC | PRN
Start: 2022-04-11 — End: 2022-04-11
  Administered 2022-04-11: 15 mL via INTRAVENOUS

## 2022-04-13 ENCOUNTER — Telehealth: Payer: Self-pay

## 2022-04-13 NOTE — Telephone Encounter (Signed)
Called patient he is in agreement to the referral sending to [facility] today

## 2022-04-17 ENCOUNTER — Other Ambulatory Visit: Payer: Self-pay | Admitting: Internal Medicine

## 2022-04-21 DIAGNOSIS — Z6827 Body mass index (BMI) 27.0-27.9, adult: Secondary | ICD-10-CM | POA: Diagnosis not present

## 2022-04-21 DIAGNOSIS — G500 Trigeminal neuralgia: Secondary | ICD-10-CM | POA: Diagnosis not present

## 2022-04-22 DIAGNOSIS — M5416 Radiculopathy, lumbar region: Secondary | ICD-10-CM | POA: Diagnosis not present

## 2022-04-22 DIAGNOSIS — M545 Low back pain, unspecified: Secondary | ICD-10-CM | POA: Diagnosis not present

## 2022-04-25 NOTE — Progress Notes (Signed)
04/25/2022 [name] [id number] 1951-06-08   Chief Complaint: Medication refill   History of Present Illness: William [name]. William [name] is a 71 year old male with a past medical history of arthritis, hypertension, Parkinson's disease, trigeminal neuralgia, lower back pain, IDA, GERD and ulcerative colitis status post total abdominal colectomy with ileoanal anastomosis greater than 20 years ago, ileoanal anastomotic stricture with partial bowel obstruction requiring periodic endoscopic pneumatic dilation. His last flexible sigmoidoscopy with anastomotic dilation was March 2016. He is followed by Dr. Henrene Pastor.  He presents today for his annual office visit regarding GERD and to refill his Omeprazole prescription.  He denies having any dysphagia or heartburn.  No upper or lower abdominal pain.  He underwent an EGD 11/02/2021 which showed a distal esophageal stricture which was dilated, hiatal hernia and multiple fundic gland polyps.  He remains on Metamucil twice daily which keeps his stools formed.  If he skips a few days of fiber he develops loose stools.  No rectal bleeding or black stools.  He has a remote history of iron deficiency anemia for which he previously took ferrous sulfate 325 mg once daily which he stopped taking a few months ago as he was not sure if he needed to take it anymore.  He is having lower back pain and is scheduled to see his orthopedist for a repeat steroid injection.  No other complaints at this time.  Last CBC and CMP were done 2 years ago.     Latest Ref Rng & Units 04/15/2020    8:51 AM 11/28/2018    9:01 AM 05/28/2017    2:31 AM  CBC  WBC 4.0 - 10.5 K/uL 3.4  3.8  3.8   Hemoglobin 13.0 - 17.0 g/dL 13.5  12.5  12.3   Hematocrit 39.0 - 52.0 % 39.6  36.7  35.7   Platelets 150.0 - 400.0 K/uL 185.0  211  158        Latest Ref Rng & Units 04/15/2020    8:51 AM 06/27/2018    9:15 AM 05/28/2017    2:31 AM  CMP  Glucose 70 - 99 mg/dL 104  97  114   BUN 6 - 23 mg/dL 24  21  15     Creatinine 0.40 - 1.50 mg/dL 1.13  1.07  1.05   Sodium 135 - 145 mEq/L 131  133  131   Potassium 3.5 - 5.1 mEq/L 4.4  4.4  3.4   Chloride 96 - 112 mEq/L 102  102  101   CO2 19 - 32 mEq/L 25  23  24    Calcium 8.4 - 10.5 mg/dL 9.5  9.4  8.6   Total Protein 6.0 - 8.3 g/dL 6.9  6.6    Total Bilirubin 0.2 - 1.2 mg/dL 0.6  0.5    Alkaline Phos 39 - 117 U/L 37     AST 0 - 37 U/L 24  17    ALT 0 - 53 U/L 17  18       Past Medical History:  Diagnosis Date   Allergic rhinitis    Anemia, iron deficiency    Arthritis    BPH associated with nocturia    ED (erectile dysfunction)    GERD (gastroesophageal reflux disease)    Hiatal hernia    History of COVID-19 11/2020   per pt moderate symptoms that resovled   History of hypertension    followed by pcp  (per pt resolved no medication since approx 2017)  History of kidney stones    Left ureteral calculus    Parkinson disease [facility])    neurologist--- dr tat;   mild jaw dyskinesia,   gait disorder, tremors   Peyronie's disease    S/P dilatation of esophageal stricture    Trigeminal neuropathy    left side   Ulcerative colitis    followed by dr Henrene Pastor (GI)//   1992 s/p  total abdominal colectomy with ileoanal anastomosis   Wears glasses    Current Outpatient Medications on File Prior to Visit  Medication Sig Dispense Refill   carbidopa-levodopa (SINEMET IR) 25-100 MG tablet TAKE 2 TABLETS DAILY AT 7 AM,AND 2 TABLETS AT 11 AM,AND 2 TABLETS AT 4 PM 540 tablet 0   pregabalin (LYRICA) 75 MG capsule Take 1 capsule (75 mg total) by mouth 2 (two) times daily. 60 capsule 1   psyllium (METAMUCIL) 58.6 % powder Take 1 packet by mouth 2 (two) times daily. 1 TBS Twice Daily.     rotigotine (NEUPRO) 6 MG/24HR Place 1 patch onto the skin daily. 90 patch 1   sildenafil (REVATIO) 20 MG tablet Take 20 mg by mouth as needed (1-5 tabs for ed).     baclofen (LIORESAL) 10 MG tablet Take 1 tablet (10 mg total) by mouth 3 (three) times daily. (Patient not  taking: Reported on 03/30/2022) 90 each 2   carbamazepine (TEGRETOL) 200 MG tablet Take 0.5-1 tablets (100-200 mg total) by mouth 3 (three) times daily. (Patient not taking: Reported on 03/30/2022) 180 tablet 2   ferrous sulfate 325 (65 FE) MG tablet Take 325 mg by mouth daily with breakfast. (Patient not taking: Reported on 04/26/2022)     traMADol (ULTRAM) 50 MG tablet Take 1-2 tablets (50-100 mg total) by mouth every 6 (six) hours as needed. (Patient not taking: Reported on 04/26/2022) 120 tablet 1   No current facility-administered medications on file prior to visit.   No Known Allergies  Current Medications, Allergies, Past Medical History, Past Surgical History, Family History and Social History were reviewed in [employer] record.  Review of Systems:   Constitutional: Negative for fever, sweats, chills or weight loss.  Respiratory: Negative for shortness of breath.   Cardiovascular: Negative for chest pain, palpitations and leg swelling.  Gastrointestinal: See HPI.  Musculoskeletal: Negative for back pain or muscle aches.  Neurological: Negative for dizziness, headaches or paresthesias.   Physical Exam: BP 132/78   Pulse (!) 58   Ht 5' 6.5" (1.689 m)   Wt 177 lb (80.3 kg)   SpO2 96%   BMI 28.14 kg/m   Wt Readings from Last 3 Encounters:  04/26/22 177 lb (80.3 kg)  03/30/22 176 lb (79.8 kg)  03/11/22 176 lb (79.8 kg)    General: 71 year old male in no acute distress. Head: Normocephalic and atraumatic. Eyes: No scleral icterus. Conjunctiva pink . Ears: Normal auditory acuity. Mouth: Dentition intact. No ulcers or lesions.  Lungs: Clear throughout to auscultation. Heart: Regular rate and rhythm, no murmur. Abdomen: Soft, nontender and nondistended. No masses or hepatomegaly. Normal bowel sounds x 4 quadrants.  Midline lower abdominal scar intact. Rectal: Deferred. Musculoskeletal: Symmetrical with no gross deformities. Extremities: No  edema. Neurological: Alert oriented x 4. No focal deficits.  Psychological: Alert and cooperative. Normal mood and affect  Assessment and Recommendations:  1) GERD with erosive esophagitis/peptic stricture s/p esophageal dilatation 10/2001, stable on Omeprazole 40 mg daily -Continue GERD diet -Omeprazole 40 mg 1 p.o. daily #90 with 3 additional refills -  CMP -Follow-up in 1 year  2) Iron deficiency anemia secondary, patient stopped taking ferrous sulfate 2 to 3 months ago -CBC, IBC + Ferritin -Further ferrous sulfate recommendations to be determined after the above lab results reviewed  3) Remote history of ulcerative colitis status post total abdominal colectomy with ileoanal anastomosis  4) History of anastomotic stricture requiring pneumatic dilation, last flex sig with dilatation was in 2016 -Continue Metamucil twice daily

## 2022-04-26 ENCOUNTER — Ambulatory Visit: Payer: Medicare HMO | Admitting: Nurse Practitioner

## 2022-04-26 ENCOUNTER — Other Ambulatory Visit (INDEPENDENT_AMBULATORY_CARE_PROVIDER_SITE_OTHER): Payer: Medicare HMO

## 2022-04-26 ENCOUNTER — Encounter: Payer: Self-pay | Admitting: Nurse Practitioner

## 2022-04-26 VITALS — BP 132/78 | HR 58 | Ht 66.5 in | Wt 177.0 lb

## 2022-04-26 DIAGNOSIS — K219 Gastro-esophageal reflux disease without esophagitis: Secondary | ICD-10-CM | POA: Diagnosis not present

## 2022-04-26 DIAGNOSIS — D509 Iron deficiency anemia, unspecified: Secondary | ICD-10-CM

## 2022-04-26 LAB — COMPREHENSIVE METABOLIC PANEL
ALT: 12 U/L (ref 0–53)
AST: 17 U/L (ref 0–37)
Albumin: 4.4 g/dL (ref 3.5–5.2)
Alkaline Phosphatase: 40 U/L (ref 39–117)
BUN: 23 mg/dL (ref 6–23)
CO2: 24 mEq/L (ref 19–32)
Calcium: 9.3 mg/dL (ref 8.4–10.5)
Chloride: 100 mEq/L (ref 96–112)
Creatinine, Ser: 1.11 mg/dL (ref 0.40–1.50)
GFR: 67.16 mL/min (ref >60.00)
Glucose, Bld: 95 mg/dL (ref 70–99)
Potassium: 4.2 mEq/L (ref 3.5–5.1)
Sodium: 135 mEq/L (ref 135–145)
Total Bilirubin: 0.7 mg/dL (ref 0.2–1.2)
Total Protein: 7 g/dL (ref 6.0–8.3)

## 2022-04-26 LAB — CBC
HCT: 38.3 % — ABNORMAL LOW (ref 39.0–52.0)
Hemoglobin: 13.1 g/dL (ref 13.0–17.0)
MCHC: 34.3 g/dL (ref 30.0–36.0)
MCV: 92 fl (ref 78.0–100.0)
Platelets: 165 10*3/uL (ref 150.0–400.0)
RBC: 4.16 Mil/uL — ABNORMAL LOW (ref 4.22–5.81)
RDW: 13 % (ref 11.5–15.5)
WBC: 5.5 10*3/uL (ref 4.0–10.5)

## 2022-04-26 LAB — IBC + FERRITIN
Ferritin: 13.4 ng/mL — ABNORMAL LOW (ref 22.0–322.0)
Iron: 110 ug/dL (ref 42–165)
Saturation Ratios: 19.2 % — ABNORMAL LOW (ref 20.0–50.0)
TIBC: 572.6 ug/dL — ABNORMAL HIGH (ref 250.0–450.0)
Transferrin: 409 mg/dL — ABNORMAL HIGH (ref 212.0–360.0)

## 2022-04-26 MED ORDER — OMEPRAZOLE 40 MG PO CPDR: 40.0000 mg | DELAYED_RELEASE_CAPSULE | Freq: Two times a day (BID) | ORAL | 3 refills | Status: DC

## 2022-04-26 NOTE — Progress Notes (Signed)
noted 

## 2022-04-26 NOTE — Patient Instructions (Addendum)
_______________________________________________________  If you are age 71 or older, your body mass index should be between 23-30. Your Body mass index is 28.14 kg/m. If this is out of the aforementioned range listed, please consider follow up with your Primary Care Provider.  If you are age 27 or younger, your body mass index should be between 19-25. Your Body mass index is 28.14 kg/m. If this is out of the aformentioned range listed, please consider follow up with your Primary Care Provider.   ________________________________________________________  The [facility] providers would like to encourage you to use [facility] to communicate with providers for non-urgent requests or questions.  Due to long hold times on the telephone, sending your provider a message by [facility] may be a faster and more efficient way to get a response.  Please allow 48 business hours for a response.  Please remember that this is for non-urgent requests.  _______________________________________________________  Your provider has requested that you go to the basement level for lab work before leaving today. Press "B" on the elevator. The lab is located at the first door on the left as you exit the elevator.  Continue GERD diet.  Due to recent changes in healthcare laws, you may see the results of your imaging and laboratory studies on MyChart before your provider has had a chance to review them.  We understand that in some cases there may be results that are confusing or concerning to you. Not all laboratory results come back in the same time frame and the provider may be waiting for multiple results in order to interpret others.  Please give [place] 48 hours in order for your provider to thoroughly review all the results before contacting the office for clarification of your results.   Follow up in one year.  It was a pleasure to see you today!  Thank you for trusting me with your gastrointestinal care!

## 2022-04-28 DIAGNOSIS — M5451 Vertebrogenic low back pain: Secondary | ICD-10-CM | POA: Diagnosis not present

## 2022-05-10 ENCOUNTER — Other Ambulatory Visit: Payer: Self-pay | Admitting: Internal Medicine

## 2022-05-13 DIAGNOSIS — M5416 Radiculopathy, lumbar region: Secondary | ICD-10-CM | POA: Diagnosis not present

## 2022-05-26 ENCOUNTER — Telehealth: Payer: Self-pay | Admitting: Neurology

## 2022-05-26 DIAGNOSIS — M5451 Vertebrogenic low back pain: Secondary | ICD-10-CM | POA: Diagnosis not present

## 2022-05-26 DIAGNOSIS — M7138 Other bursal cyst, other site: Secondary | ICD-10-CM | POA: Diagnosis not present

## 2022-05-26 NOTE — Telephone Encounter (Signed)
Patient dropped off surgery clearance form for completion. Placed in Dr. Doristine Devoid inbox.

## 2022-05-26 NOTE — Telephone Encounter (Signed)
Document is in Dr. Arturo Morton box

## 2022-05-27 ENCOUNTER — Telehealth: Payer: Self-pay | Admitting: Neurology

## 2022-05-27 NOTE — Telephone Encounter (Signed)
Called pateint put papers in Dr. Arturo Morton box for signing

## 2022-05-27 NOTE — Telephone Encounter (Signed)
Patient dropped some forms off yesterday and needs to know if Dr Tat would need to see him in person before the back surgery  please call

## 2022-06-01 ENCOUNTER — Telehealth: Payer: Self-pay | Admitting: Internal Medicine

## 2022-06-01 ENCOUNTER — Other Ambulatory Visit: Payer: Self-pay | Admitting: Neurology

## 2022-06-01 NOTE — Telephone Encounter (Signed)
Patient called to check on the status of the forms.

## 2022-06-01 NOTE — Telephone Encounter (Signed)
Called patient will fax documents

## 2022-06-02 DIAGNOSIS — G500 Trigeminal neuralgia: Secondary | ICD-10-CM | POA: Diagnosis not present

## 2022-06-11 ENCOUNTER — Ambulatory Visit (HOSPITAL_COMMUNITY): Payer: Self-pay | Admitting: Orthopedic Surgery

## 2022-06-11 ENCOUNTER — Other Ambulatory Visit: Payer: Self-pay | Admitting: Neurology

## 2022-06-12 DIAGNOSIS — Z23 Encounter for immunization: Secondary | ICD-10-CM | POA: Diagnosis not present

## 2022-06-17 NOTE — Progress Notes (Signed)
Surgical Instructions   Your procedure is scheduled on Monday, 06/28/22 at 7:30 AM.  Report to [facility] Main Entrance "A" at 5:30 AM, then check in with the Admitting office.  Call this number if you have problems the morning of surgery:  [phone]   If you have any questions prior to your surgery date call [phone]: Open Monday-Friday 8am-4pm  If you experience any cold or flu symptoms such as cough, fever, chills, shortness of breath, etc. between now and your scheduled surgery, please notify us at the above number    Remember:  Do not eat after midnight the night before your surgery.  You may drink clear liquids until 4:30 AM the morning of your surgery.   Clear liquids allowed are: Water, Non-Citrus Juices (without pulp), Carbonated Beverages, Clear Tea, Black Coffee ONLY (NO MILK, CREAM OR POWDERED CREAMER of any kind), and Gatorade    Take these medicines the morning of surgery with A SIP OF WATER: Carbidopa-Levodopa (Sinemet IR) Omeprazole (Prilosec) Pregabalin (Lyrica)  As of today, DO NOT use any Aspirin, Aleve, Naproxen, Ibuprofen, Motrin, Advil, Goody's, BC's, herbal medications, fish oil or vitamins.          Do not wear jewelry. Do not wear lotions, powders, cologne or deodorant. Do not shave 48 hours prior to surgery. Men may shave face and neck. Do not bring valuables to the hospital.  [facility] is not responsible for any belongings or valuables.    Contacts, glasses, hearing aids, dentures or partials may not be worn into surgery, please bring cases for these belongings   For patients admitted to the hospital, discharge time will be determined by your treatment team.   Patients discharged the day of surgery will not be allowed to drive home, and someone needs to stay with them for 24 hours.  SURGICAL WAITING ROOM VISITATION Patients having surgery or a procedure may have no more than 2 support people in the waiting area - these visitors may rotate.    Children under the age of 72 must have an adult with them who is not the patient. If the patient needs to stay at the hospital during part of their recovery, the visitor guidelines for inpatient rooms apply. Pre-op nurse will coordinate an appropriate time for 1 support person to accompany patient in pre-op.  This support person may not rotate.   Please refer to the [facility] website for the visitor guidelines for Inpatients (after your surgery is over and you are in a regular room).   Special instructions:    Oral Hygiene is also important to reduce your risk of infection.  Remember - BRUSH YOUR TEETH THE MORNING OF SURGERY WITH YOUR REGULAR TOOTHPASTE  [place]- Preparing For Surgery  Before surgery, you can play an important role. Because skin is not sterile, your skin needs to be as free of germs as possible. You can reduce the number of germs on your skin by washing with CHG (chlorahexidine gluconate) Soap before surgery.  CHG is an antiseptic cleaner which kills germs and bonds with the skin to continue killing germs even after washing.    Please do not use if you have an allergy to CHG or antibacterial soaps. If your skin becomes reddened/irritated stop using the CHG.  Do not shave (including legs and underarms) for at least 48 hours prior to first CHG shower. It is OK to shave your face.  Please follow these instructions carefully.    Shower the [employer] SURGERY and  the MORNING OF SURGERY with CHG Soap.   If you chose to wash your hair, wash your hair first as usual with your normal shampoo. After you shampoo, rinse your hair and body thoroughly to remove the shampoo.  Then [employer] and genitals (private parts) with your normal soap and rinse thoroughly to remove soap.  After that Use CHG Soap as you would any other liquid soap. You can apply CHG directly to the skin and wash gently with a scrungie or a clean washcloth.   Apply the CHG Soap to your body ONLY FROM THE NECK  DOWN.  Do not use on open wounds or open sores. Avoid contact with your eyes, ears, mouth and genitals (private parts). Wash Face and genitals (private parts)  with your normal soap.   Wash thoroughly, paying special attention to the area where your surgery will be performed.  Thoroughly rinse your body with warm water from the neck down.  DO NOT shower/wash with your normal soap after using and rinsing off the CHG Soap.  Pat yourself dry with a CLEAN TOWEL.  Wear CLEAN PAJAMAS to bed the night before surgery  Place CLEAN SHEETS on your bed the night before your surgery  DO NOT SLEEP WITH PETS.  Day of Surgery: Take a shower with CHG soap. Wear Clean/Comfortable clothing the morning of surgery Do not apply any deodorants/lotions.   Remember to brush your teeth WITH YOUR REGULAR TOOTHPASTE.   Please read over the fact sheets that you were given.

## 2022-06-18 ENCOUNTER — Encounter (HOSPITAL_COMMUNITY): Payer: Self-pay

## 2022-06-18 ENCOUNTER — Encounter (HOSPITAL_COMMUNITY)
Admission: RE | Admit: 2022-06-18 | Discharge: 2022-06-18 | Disposition: A | Discharge status: Home / Self Care | Payer: Medicare HMO | Source: Ambulatory Visit | Attending: Orthopedic Surgery | Admitting: Orthopedic Surgery

## 2022-06-18 ENCOUNTER — Other Ambulatory Visit (HOSPITAL_COMMUNITY): Payer: Medicare HMO

## 2022-06-18 ENCOUNTER — Other Ambulatory Visit: Payer: Self-pay

## 2022-06-18 VITALS — BP 143/79 | HR 70 | Temp 97.6°F | Resp 17 | Ht 66.5 in | Wt 186.4 lb

## 2022-06-18 DIAGNOSIS — Z87891 Personal history of nicotine dependence: Secondary | ICD-10-CM | POA: Insufficient documentation

## 2022-06-18 DIAGNOSIS — N486 Induration penis plastica: Secondary | ICD-10-CM | POA: Diagnosis not present

## 2022-06-18 DIAGNOSIS — K222 Esophageal obstruction: Secondary | ICD-10-CM | POA: Insufficient documentation

## 2022-06-18 DIAGNOSIS — K449 Diaphragmatic hernia without obstruction or gangrene: Secondary | ICD-10-CM | POA: Insufficient documentation

## 2022-06-18 DIAGNOSIS — G20A1 Parkinson's disease without dyskinesia, without mention of fluctuations: Secondary | ICD-10-CM | POA: Insufficient documentation

## 2022-06-18 DIAGNOSIS — K5190 Ulcerative colitis, unspecified, without complications: Secondary | ICD-10-CM | POA: Diagnosis not present

## 2022-06-18 DIAGNOSIS — K219 Gastro-esophageal reflux disease without esophagitis: Secondary | ICD-10-CM | POA: Insufficient documentation

## 2022-06-18 DIAGNOSIS — I10 Essential (primary) hypertension: Secondary | ICD-10-CM | POA: Diagnosis not present

## 2022-06-18 DIAGNOSIS — Z01818 Encounter for other preprocedural examination: Secondary | ICD-10-CM | POA: Diagnosis not present

## 2022-06-18 DIAGNOSIS — M48061 Spinal stenosis, lumbar region without neurogenic claudication: Secondary | ICD-10-CM | POA: Insufficient documentation

## 2022-06-18 LAB — BASIC METABOLIC PANEL
Anion gap: 13 (ref 5–15)
BUN: 25 mg/dL — ABNORMAL HIGH (ref 8–23)
CO2: 23 mmol/L (ref 22–32)
Calcium: 9.7 mg/dL (ref 8.9–10.3)
Chloride: 104 mmol/L (ref 98–111)
Creatinine, Ser: 1.26 mg/dL — ABNORMAL HIGH (ref 0.61–1.24)
GFR, Estimated: >60 mL/min (ref >60)
Glucose, Bld: 116 mg/dL — ABNORMAL HIGH (ref 70–99)
Potassium: 4.1 mmol/L (ref 3.5–5.1)
Sodium: 140 mmol/L (ref 135–145)

## 2022-06-18 LAB — SURGICAL PCR SCREEN
MRSA, PCR: NEGATIVE
Staphylococcus aureus: POSITIVE — AB

## 2022-06-18 LAB — CBC
HCT: 40.5 % (ref 39.0–52.0)
Hemoglobin: 14.3 g/dL (ref 13.0–17.0)
MCH: 32.6 pg (ref 26.0–34.0)
MCHC: 35.3 g/dL (ref 30.0–36.0)
MCV: 92.3 fL (ref 80.0–100.0)
Platelets: 169 10*3/uL (ref 150–400)
RBC: 4.39 MIL/uL (ref 4.22–5.81)
RDW: 13.2 % (ref 11.5–15.5)
WBC: 4.9 10*3/uL (ref 4.0–10.5)
nRBC: 0 % (ref 0.0–0.2)

## 2022-06-18 NOTE — Progress Notes (Signed)
PCP: Dr. Alain Marion Cardiologist: saw Dr. Claiborne Billings, last in 2019 and only to return as needed Neurologist: Dr. Carles Collet  EKG: 06/18/22 CXR:  ECHO: 05/28/22 Stress Test: Cardiac Cath: denies  ERAS: clears until 6:30am  Patient denies shortness of breath, fever, cough, and chest pain at PAT appointment.  Patient verbalized understanding of instructions provided today at the PAT appointment.  Patient asked to review instructions at home and day of surgery.   Dr. Rolena Infante' patient, will forward to anesthesia for review

## 2022-06-21 NOTE — Progress Notes (Signed)
Anesthesia Chart Review:  Case: [id number] Date/Time: 06/28/22 0715   Procedure: L4-5 decompression with removal of synovial cyst - 2.5 hrs 3 C-Bed   Anesthesia type: General   Pre-op diagnosis: L4-5 spinal stenosis with right facet cyst   Location: [facility] OR ROOM 04 / [facility]   Surgeons: Melina Schools, MD       DISCUSSION: Patient is a 71 year old male scheduled for the above procedure.  History includes former smoker (quit 09/14/79), ulcerative colitis (total colectomy with ileoanal anastomosis 1992; sigmoidoscopy with anastomotic dilation 11/2014), Parkinson disease, HTN, BPH, Peyronie's disease, hiatal hernia, GERD, esophageal stricture (s/p dilation), IDA, trigeminal neuropathy (left side), sinus surgery (2004).   Medical clearance note addressed to PCP Plotnikov, Evie Lacks, MD was signed on 06/04/22 (scanned under Media tab). Notes suggest his neurologist Dr. Carles Collet also signed surgical clearance letter (message left for Dr. Rolena Infante' staff to fax copy to PAT). He is on rotigotine 6 mg/24 hr daily patch and Sinemet 25-100 mg 2 tablets at 7AM, 11AM, and 4PM for Parkinson's disease.  Lyrica prescribed for trigeminal neuralgia, but listed as not started yet (had failed carbamazepine, baclofen and Robaxin, tramadol, Emla, gabapentin). He was re-referred to neurosurgery ~ 04/2022 as he had previously declined surgical decompression in 2018 because trigeminal neuralgia pain had subsided then.  Anesthesia team to evaluate on the day of surgery.   VS: BP (!) 143/79   Pulse 70   Temp 36.4 C   Resp 17   Ht 5' 6.5" (1.689 m)   Wt 84.6 kg   SpO2 97%   BMI 29.64 kg/m    PROVIDERS: Plotnikov, Evie Lacks, MD is PCP  Tat, Wells Guiles, DO is neurologist.  Last visit 03/31/2022. Scarlette Shorts, MD is GI last visit 04/26/2022 with Carl Best, NP.  1 year follow-up recommended. Franchot Gallo, MD is urologist - He is not followed routinely by cardiology, but last seen by Shelva Majestic, MD on 09/21/17 for  history of atypical chest pain, normal LVEF on 2018 echo. He was able to walk on a treadmill consistently without chest pain. As needed cardiology follow-up recommended.    LABS: Labs reviewed: Acceptable for surgery. LFTs normal 04/26/22.  (all labs ordered are listed, but only abnormal results are displayed)  Labs Reviewed  SURGICAL PCR SCREEN - Abnormal; Notable for the following components:      Result Value   Staphylococcus aureus POSITIVE (*)    All other components within normal limits  BASIC METABOLIC PANEL - Abnormal; Notable for the following components:   Glucose, Bld 116 (*)    BUN 25 (*)    Creatinine, Ser 1.26 (*)    All other components within normal limits  CBC     IMAGES: MRI Face 04/11/22: IMPRESSION: As shown on the study of 2018, there is an arterial branch of the left anterior inferior cerebellar artery passing adjacent to the root entry zone of the left fifth nerve which could be symptomatic. - Referred to [facility] by Dr. Carles Collet. He had previously declined decompression ~ 2018 since left facial pain had improved at that time.  MRI L-spine 03/17/22: Per 05/26/22 [facility] note, "Severe spinal stenosis at L4-5 with large right-sided facet cyst contributing to stenosis.  There is severe right foraminal and lateral recess stenosis affecting the exiting L4 and traversing L5 nerve root.  Mild degeneration changes L3-4 and L5-S1.  No fracture noted.  No suspicious marrow signal abnormality.   EKG: 06/18/22: Ventricular rate 58 bpm Sinus bradycardia with 1st  degree A-V block Left axis deviation Incomplete right bundle branch block Cannot rule out Anterior infarct , age undetermined Abnormal ECG When compared with ECG of 28-May-2017 05:30, No significant change since last tracing Confirmed by Glenetta Hew [phone]) on 06/19/2022 1:32:05 PM   CV: Echo 05/28/17: Study Conclusions  - Left ventricle: The cavity size was normal. Systolic function was     normal. The estimated ejection fraction was in the range of 60%    to 65%. Wall motion was normal; there were no regional wall    motion abnormalities. Mild concentric and moderate focal basal    septal hypertrophy. Diastolic dysfunction, grade indeterminate.    Indeterminate filling pressures.  - Aortic valve: There was mild regurgitation.  - Aorta: Mild aortic root dilatation, 39 mm.  - Mitral valve: There was mild regurgitation.  - Left atrium: The atrium was moderately dilated.  - Atrial septum: No defect or patent foramen ovale was identified.    Past Medical History:  Diagnosis Date   Allergic rhinitis    Anemia, iron deficiency    Arthritis    BPH associated with nocturia    ED (erectile dysfunction)    GERD (gastroesophageal reflux disease)    Hiatal hernia    History of COVID-19 11/2020   per pt moderate symptoms that resovled   History of hypertension    followed by pcp  (per pt resolved no medication since approx 2017)   History of kidney stones    Left ureteral calculus    Parkinson disease    neurologist--- dr tat;   mild jaw dyskinesia,   gait disorder, tremors   Peyronie's disease    S/P dilatation of esophageal stricture    Trigeminal neuropathy    left side   Ulcerative colitis    followed by dr Henrene Pastor (GI)//   1992 s/p  total abdominal colectomy with ileoanal anastomosis   Wears glasses     Past Surgical History:  Procedure Laterality Date   CYSTOSCOPY W/ URETERAL STENT PLACEMENT  2012   for ureteral stricture   CYSTOSCOPY WITH RETROGRADE PYELOGRAM, URETEROSCOPY AND STENT PLACEMENT Left 12/31/2021   Procedure: CYSTOSCOPY WITH RETROGRADE PYELOGRAM, FLUOROSCOPIC INTERPRETATION, URETEROSCOPY  AND STENT PLACEMENT;  Surgeon: Franchot Gallo, MD;  Location[facility];  Service: Urology;  Laterality: Left;   CYSTOSCOPY/URETEROSCOPY/HOLMIUM LASER/STENT PLACEMENT  10/22/2005   @[facility] ;   w/ left ureteral balloon dilatation   HOLMIUM LASER  APPLICATION Left 7/62/8315   Procedure: HOLMIUM LASER APPLICATION;  Surgeon: Franchot Gallo, MD;  Location: [facility];  Service: Urology;  Laterality: Left;   INGUINAL HERNIA REPAIR     per pt unilateral  approxl 1962   KNEE ARTHROSCOPY Right 02/21/2002   @[facility]    NASAL SINUS SURGERY     x2  in 2004   RECONSTRUCTION TENDON PULLEY HAND Right 05/03/2007   @[facility] ;  right long finger   TOTAL COLECTOMY  1992   W/  ILIEOANAL ANASTOMOSIS (for ilieoanal anatomatic stricture , partial bowel obstruction and required peroidic pneumatic dilatation's)   VARICOCELECTOMY      MEDICATIONS:  baclofen (LIORESAL) 10 MG tablet   carbamazepine (TEGRETOL) 200 MG tablet   carbidopa-levodopa (SINEMET IR) 25-100 MG tablet   Ferrous Sulfate 27 MG TABS   ibuprofen (ADVIL) 200 MG tablet   omeprazole (PRILOSEC) 40 MG capsule   pregabalin (LYRICA) 150 MG capsule   pregabalin (LYRICA) 75 MG capsule   psyllium (METAMUCIL) 58.6 % powder   rotigotine (NEUPRO) 6 MG/24HR  sildenafil (VIAGRA) 100 MG tablet   traMADol (ULTRAM) 50 MG tablet   No current facility-administered medications for this encounter.    Myra Gianotti, PA-C Surgical Short Stay/Anesthesiology [facility] Phone [phone] [facility] Phone [phone] 06/21/2022 2:12 PM

## 2022-06-21 NOTE — Anesthesia Preprocedure Evaluation (Addendum)
Anesthesia Evaluation  Patient identified by MRN, date of birth, ID band Patient awake    Reviewed: Allergy & Precautions, NPO status , Patient's Chart, lab work & pertinent test results  Airway Mallampati: III  TM Distance: >3 FB Neck ROM: Full    Dental  (+) Teeth Intact, Dental Advisory Given   Pulmonary former smoker   Pulmonary exam normal breath sounds clear to auscultation       Cardiovascular hypertension, (-) angina (-) Past MI Normal cardiovascular exam Rhythm:Regular Rate:Normal     Neuro/Psych  Headaches Parkinsons TGN    GI/Hepatic Neg liver ROS, hiatal hernia,GERD  Medicated and Controlled,,UC S/P mult gi surgeries   Endo/Other  negative endocrine ROS    Renal/GU negative Renal ROS     Musculoskeletal  (+) Arthritis ,    Abdominal   Peds  Hematology negative hematology ROS (+)   Anesthesia Other Findings   Reproductive/Obstetrics                                                              Anesthesia Evaluation  Patient identified by MRN, date of birth, ID band Patient awake    Reviewed: Allergy & Precautions, NPO status , Patient's Chart, lab work & pertinent test results  Airway Mallampati: II  TM Distance: >3 FB Neck ROM: Full    Dental no notable dental hx.    Pulmonary neg pulmonary ROS, former smoker,    Pulmonary exam normal breath sounds clear to auscultation       Cardiovascular hypertension, Normal cardiovascular exam Rhythm:Regular Rate:Normal     Neuro/Psych  Headaches, negative psych ROS   GI/Hepatic Neg liver ROS, GERD  ,  Endo/Other  negative endocrine ROS  Renal/GU negative Renal ROS  negative genitourinary   Musculoskeletal  (+) Arthritis , Osteoarthritis,    Abdominal   Peds negative pediatric ROS (+)  Hematology negative hematology ROS (+)   Anesthesia Other Findings   Reproductive/Obstetrics negative OB  ROS                             Anesthesia Physical Anesthesia Plan  ASA: 2  Anesthesia Plan: General   Post-op Pain Management:    Induction: Intravenous  PONV Risk Score and Plan: 2 and Ondansetron, Midazolam and Treatment may vary due to age or medical condition  Airway Management Planned: LMA  Additional Equipment:   Intra-op Plan:   Post-operative Plan: Extubation in OR  Informed Consent: I have reviewed the patients History and Physical, chart, labs and discussed the procedure including the risks, benefits and alternatives for the proposed anesthesia with the patient or authorized representative who has indicated his/her understanding and acceptance.     Dental advisory given  Plan Discussed with: CRNA  Anesthesia Plan Comments:         Anesthesia Quick Evaluation  Anesthesia Physical Anesthesia Plan  ASA: 3  Anesthesia Plan: General   Post-op Pain Management: Tylenol PO (pre-op)*   Induction: Intravenous  PONV Risk Score and Plan: 3 and Ondansetron, Dexamethasone and Diphenhydramine  Airway Management Planned: Oral ETT  Additional Equipment:   Intra-op Plan:   Post-operative Plan: Extubation in OR  Informed Consent: I have reviewed the patients History and Physical, chart, labs and discussed  the procedure including the risks, benefits and alternatives for the proposed anesthesia with the patient or authorized representative who has indicated his/her understanding and acceptance.     Dental advisory given  Plan Discussed with: Anesthesiologist  Anesthesia Plan Comments: (PAT note written 06/21/2022 by Myra Gianotti, PA-C. )        Anesthesia Quick Evaluation

## 2022-06-22 ENCOUNTER — Telehealth: Payer: Self-pay | Admitting: Neurology

## 2022-06-22 NOTE — Telephone Encounter (Signed)
Sherry from [facility] called and left a message requesting an update on the surgery clearance form that was sent.

## 2022-06-22 NOTE — Telephone Encounter (Signed)
Called William [name] at [facility] left voicemail that I have sent the paperwork on the 9/19 and 9/28 Dr. Carles Collet has indicated she is not patients medical physician but that his PD is under adequate control for the procedure

## 2022-06-25 DIAGNOSIS — M5451 Vertebrogenic low back pain: Secondary | ICD-10-CM | POA: Diagnosis not present

## 2022-06-28 ENCOUNTER — Other Ambulatory Visit: Payer: Self-pay

## 2022-06-28 ENCOUNTER — Ambulatory Visit (HOSPITAL_BASED_OUTPATIENT_CLINIC_OR_DEPARTMENT_OTHER): Payer: Medicare HMO | Admitting: Physician Assistant

## 2022-06-28 ENCOUNTER — Ambulatory Visit (HOSPITAL_COMMUNITY): Payer: Medicare HMO

## 2022-06-28 ENCOUNTER — Observation Stay (HOSPITAL_COMMUNITY)
Admission: RE | Admit: 2022-06-28 | Discharge: 2022-06-29 | Disposition: A | Discharge status: Home / Self Care | Payer: Medicare HMO | Attending: Orthopedic Surgery | Admitting: Orthopedic Surgery

## 2022-06-28 ENCOUNTER — Encounter (HOSPITAL_COMMUNITY)
Admission: RE | Disposition: A | Discharge status: Home / Self Care | Payer: Self-pay | Source: Home / Self Care | Attending: Orthopedic Surgery

## 2022-06-28 ENCOUNTER — Encounter (HOSPITAL_COMMUNITY): Payer: Self-pay | Admitting: Orthopedic Surgery

## 2022-06-28 ENCOUNTER — Ambulatory Visit (HOSPITAL_COMMUNITY): Payer: Medicare HMO | Admitting: Physician Assistant

## 2022-06-28 DIAGNOSIS — Z8616 Personal history of COVID-19: Secondary | ICD-10-CM | POA: Diagnosis not present

## 2022-06-28 DIAGNOSIS — I10 Essential (primary) hypertension: Secondary | ICD-10-CM

## 2022-06-28 DIAGNOSIS — Z01818 Encounter for other preprocedural examination: Secondary | ICD-10-CM

## 2022-06-28 DIAGNOSIS — M5416 Radiculopathy, lumbar region: Secondary | ICD-10-CM | POA: Insufficient documentation

## 2022-06-28 DIAGNOSIS — M7138 Other bursal cyst, other site: Secondary | ICD-10-CM | POA: Insufficient documentation

## 2022-06-28 DIAGNOSIS — Z87891 Personal history of nicotine dependence: Secondary | ICD-10-CM | POA: Diagnosis not present

## 2022-06-28 DIAGNOSIS — G20C Parkinsonism, unspecified: Secondary | ICD-10-CM | POA: Diagnosis not present

## 2022-06-28 DIAGNOSIS — Z79899 Other long term (current) drug therapy: Secondary | ICD-10-CM | POA: Insufficient documentation

## 2022-06-28 DIAGNOSIS — M48061 Spinal stenosis, lumbar region without neurogenic claudication: Secondary | ICD-10-CM | POA: Diagnosis not present

## 2022-06-28 DIAGNOSIS — Z0389 Encounter for observation for other suspected diseases and conditions ruled out: Secondary | ICD-10-CM | POA: Diagnosis not present

## 2022-06-28 HISTORY — PX: LUMBAR LAMINECTOMY/DECOMPRESSION MICRODISCECTOMY: SHX5026

## 2022-06-28 SURGERY — LUMBAR LAMINECTOMY/DECOMPRESSION MICRODISCECTOMY 1 LEVEL
Anesthesia: General

## 2022-06-28 MED ORDER — CHLORHEXIDINE GLUCONATE 0.12 % MT SOLN
OROMUCOSAL | Status: AC
  Administered 2022-06-28: 15 mL via OROMUCOSAL
  Filled 2022-06-28: qty 15

## 2022-06-28 MED ORDER — FENTANYL CITRATE (PF) 100 MCG/2ML IJ SOLN
25.0000 ug | INTRAMUSCULAR | Status: DC | PRN
  Administered 2022-06-28 (×2): 50 ug via INTRAVENOUS

## 2022-06-28 MED ORDER — SUGAMMADEX SODIUM 200 MG/2ML IV SOLN
INTRAVENOUS | Status: DC | PRN
  Administered 2022-06-28: 200 mg via INTRAVENOUS

## 2022-06-28 MED ORDER — ACETAMINOPHEN 500 MG PO TABS
1000.0000 mg | ORAL_TABLET | Freq: Once | ORAL | Status: AC
  Administered 2022-06-28: 1000 mg via ORAL
  Filled 2022-06-28: qty 2

## 2022-06-28 MED ORDER — HYDROMORPHONE HCL 1 MG/ML IJ SOLN: 0.5000 mg | INTRAMUSCULAR | Status: AC | PRN

## 2022-06-28 MED ORDER — PROPOFOL 10 MG/ML IV BOLUS
INTRAVENOUS | Status: AC
  Filled 2022-06-28: qty 20

## 2022-06-28 MED ORDER — ORAL CARE MOUTH RINSE: 15.0000 mL | Freq: Once | OROMUCOSAL | Status: AC

## 2022-06-28 MED ORDER — ACETAMINOPHEN 650 MG RE SUPP: 650.0000 mg | RECTAL | Status: DC | PRN

## 2022-06-28 MED ORDER — SUCCINYLCHOLINE CHLORIDE 200 MG/10ML IV SOSY
PREFILLED_SYRINGE | INTRAVENOUS | Status: AC
  Filled 2022-06-28: qty 10

## 2022-06-28 MED ORDER — ONDANSETRON HCL 4 MG/2ML IJ SOLN
INTRAMUSCULAR | Status: DC | PRN
  Administered 2022-06-28: 4 mg via INTRAVENOUS

## 2022-06-28 MED ORDER — PHENYLEPHRINE 80 MCG/ML (10ML) SYRINGE FOR IV PUSH (FOR BLOOD PRESSURE SUPPORT)
PREFILLED_SYRINGE | INTRAVENOUS | Status: AC
  Filled 2022-06-28: qty 10

## 2022-06-28 MED ORDER — DEXAMETHASONE SODIUM PHOSPHATE 10 MG/ML IJ SOLN
INTRAMUSCULAR | Status: DC | PRN
  Administered 2022-06-28: 10 mg via INTRAVENOUS

## 2022-06-28 MED ORDER — CARBIDOPA-LEVODOPA 25-100 MG PO TABS
2.0000 | ORAL_TABLET | ORAL | Status: DC
  Administered 2022-06-28 – 2022-06-29 (×3): 2 via ORAL
  Filled 2022-06-28 (×3): qty 2

## 2022-06-28 MED ORDER — THROMBIN 20000 UNITS EX SOLR: CUTANEOUS | Status: DC | PRN

## 2022-06-28 MED ORDER — SURGIFLO WITH THROMBIN (HEMOSTATIC MATRIX KIT) OPTIME
TOPICAL | Status: DC | PRN
  Administered 2022-06-28: 1 via TOPICAL

## 2022-06-28 MED ORDER — THROMBIN 20000 UNITS EX SOLR
CUTANEOUS | Status: AC
  Filled 2022-06-28: qty 20000

## 2022-06-28 MED ORDER — CEFAZOLIN SODIUM-DEXTROSE 2-4 GM/100ML-% IV SOLN
INTRAVENOUS | Status: AC
  Filled 2022-06-28: qty 100

## 2022-06-28 MED ORDER — FENTANYL CITRATE (PF) 100 MCG/2ML IJ SOLN
INTRAMUSCULAR | Status: AC
  Filled 2022-06-28: qty 2

## 2022-06-28 MED ORDER — METHOCARBAMOL 500 MG PO TABS: 500.0000 mg | ORAL_TABLET | Freq: Three times a day (TID) | ORAL | 0 refills | Status: AC | PRN

## 2022-06-28 MED ORDER — ONDANSETRON HCL 4 MG/2ML IJ SOLN: 4.0000 mg | Freq: Four times a day (QID) | INTRAMUSCULAR | Status: DC | PRN

## 2022-06-28 MED ORDER — PROMETHAZINE HCL 25 MG/ML IJ SOLN: 6.2500 mg | INTRAMUSCULAR | Status: DC | PRN

## 2022-06-28 MED ORDER — SODIUM CHLORIDE 0.9% FLUSH: 3.0000 mL | INTRAVENOUS | Status: DC | PRN

## 2022-06-28 MED ORDER — OXYCODONE HCL 5 MG PO TABS: 5.0000 mg | ORAL_TABLET | ORAL | Status: DC | PRN

## 2022-06-28 MED ORDER — OXYCODONE HCL 5 MG PO TABS
10.0000 mg | ORAL_TABLET | ORAL | Status: DC | PRN
  Administered 2022-06-28: 10 mg via ORAL
  Filled 2022-06-28: qty 2

## 2022-06-28 MED ORDER — CHLORHEXIDINE GLUCONATE 0.12 % MT SOLN: 15.0000 mL | Freq: Once | OROMUCOSAL | Status: AC

## 2022-06-28 MED ORDER — LIDOCAINE 2% (20 MG/ML) 5 ML SYRINGE
INTRAMUSCULAR | Status: AC
  Filled 2022-06-28: qty 5

## 2022-06-28 MED ORDER — 0.9 % SODIUM CHLORIDE (POUR BTL) OPTIME
TOPICAL | Status: DC | PRN
  Administered 2022-06-28 (×2): 1000 mL

## 2022-06-28 MED ORDER — ONDANSETRON HCL 4 MG PO TABS: 4.0000 mg | ORAL_TABLET | Freq: Four times a day (QID) | ORAL | Status: DC | PRN

## 2022-06-28 MED ORDER — PHENOL 1.4 % MT LIQD: 1.0000 | OROMUCOSAL | Status: DC | PRN

## 2022-06-28 MED ORDER — BUPIVACAINE-EPINEPHRINE (PF) 0.25% -1:200000 IJ SOLN
INTRAMUSCULAR | Status: AC
  Filled 2022-06-28: qty 30

## 2022-06-28 MED ORDER — LACTATED RINGERS IV SOLN: INTRAVENOUS | Status: DC

## 2022-06-28 MED ORDER — SENNOSIDES-DOCUSATE SODIUM 8.6-50 MG PO TABS
1.0000 | ORAL_TABLET | Freq: Two times a day (BID) | ORAL | Status: DC
  Administered 2022-06-28: 1 via ORAL
  Filled 2022-06-28: qty 1

## 2022-06-28 MED ORDER — ATROPINE SULFATE 0.4 MG/ML IV SOLN
INTRAVENOUS | Status: AC
  Filled 2022-06-28: qty 1

## 2022-06-28 MED ORDER — PHENYLEPHRINE 80 MCG/ML (10ML) SYRINGE FOR IV PUSH (FOR BLOOD PRESSURE SUPPORT)
PREFILLED_SYRINGE | INTRAVENOUS | Status: DC | PRN
  Administered 2022-06-28 (×3): 80 ug via INTRAVENOUS

## 2022-06-28 MED ORDER — PROPOFOL 10 MG/ML IV BOLUS
INTRAVENOUS | Status: DC | PRN
  Administered 2022-06-28: 130 mg via INTRAVENOUS

## 2022-06-28 MED ORDER — FENTANYL CITRATE (PF) 250 MCG/5ML IJ SOLN
INTRAMUSCULAR | Status: DC | PRN
  Administered 2022-06-28 (×3): 50 ug via INTRAVENOUS

## 2022-06-28 MED ORDER — MENTHOL 3 MG MT LOZG: 1.0000 | LOZENGE | OROMUCOSAL | Status: DC | PRN

## 2022-06-28 MED ORDER — ACETAMINOPHEN 325 MG PO TABS: 650.0000 mg | ORAL_TABLET | ORAL | Status: DC | PRN

## 2022-06-28 MED ORDER — FLEET ENEMA 7-19 GM/118ML RE ENEM: 1.0000 | ENEMA | Freq: Once | RECTAL | Status: DC | PRN

## 2022-06-28 MED ORDER — SODIUM CHLORIDE 0.9% FLUSH
3.0000 mL | Freq: Two times a day (BID) | INTRAVENOUS | Status: DC
  Administered 2022-06-28 (×2): 3 mL via INTRAVENOUS

## 2022-06-28 MED ORDER — FENTANYL CITRATE (PF) 250 MCG/5ML IJ SOLN
INTRAMUSCULAR | Status: AC
  Filled 2022-06-28: qty 5

## 2022-06-28 MED ORDER — ROCURONIUM BROMIDE 10 MG/ML (PF) SYRINGE
PREFILLED_SYRINGE | INTRAVENOUS | Status: DC | PRN
  Administered 2022-06-28: 10 mg via INTRAVENOUS
  Administered 2022-06-28 (×2): 20 mg via INTRAVENOUS
  Administered 2022-06-28: 50 mg via INTRAVENOUS
  Administered 2022-06-28: 10 mg via INTRAVENOUS

## 2022-06-28 MED ORDER — CEFAZOLIN SODIUM-DEXTROSE 1-4 GM/50ML-% IV SOLN
1.0000 g | Freq: Three times a day (TID) | INTRAVENOUS | Status: AC
  Administered 2022-06-28 (×2): 1 g via INTRAVENOUS
  Filled 2022-06-28 (×2): qty 50

## 2022-06-28 MED ORDER — EPHEDRINE 5 MG/ML INJ
INTRAVENOUS | Status: AC
  Filled 2022-06-28: qty 5

## 2022-06-28 MED ORDER — SODIUM CHLORIDE 0.9 % IV SOLN: INTRAVENOUS | Status: DC

## 2022-06-28 MED ORDER — METHOCARBAMOL 500 MG PO TABS
500.0000 mg | ORAL_TABLET | Freq: Four times a day (QID) | ORAL | Status: DC | PRN
  Administered 2022-06-28 – 2022-06-29 (×3): 500 mg via ORAL
  Filled 2022-06-28 (×3): qty 1

## 2022-06-28 MED ORDER — ROCURONIUM BROMIDE 10 MG/ML (PF) SYRINGE
PREFILLED_SYRINGE | INTRAVENOUS | Status: AC
  Filled 2022-06-28: qty 10

## 2022-06-28 MED ORDER — CEFAZOLIN SODIUM-DEXTROSE 2-4 GM/100ML-% IV SOLN
2.0000 g | INTRAVENOUS | Status: AC
  Administered 2022-06-28: 2 g via INTRAVENOUS

## 2022-06-28 MED ORDER — METHOCARBAMOL 1000 MG/10ML IJ SOLN
500.0000 mg | Freq: Four times a day (QID) | INTRAVENOUS | Status: DC | PRN
  Filled 2022-06-28: qty 5

## 2022-06-28 MED ORDER — BUPIVACAINE-EPINEPHRINE 0.25% -1:200000 IJ SOLN
INTRAMUSCULAR | Status: DC | PRN
  Administered 2022-06-28: 10 mL

## 2022-06-28 MED ORDER — ACETAMINOPHEN 500 MG PO TABS
1000.0000 mg | ORAL_TABLET | Freq: Four times a day (QID) | ORAL | Status: DC | PRN
  Administered 2022-06-28 – 2022-06-29 (×3): 1000 mg via ORAL
  Filled 2022-06-28 (×3): qty 2

## 2022-06-28 MED ORDER — POLYETHYLENE GLYCOL 3350 17 G PO PACK: 17.0000 g | PACK | Freq: Every day | ORAL | Status: DC | PRN

## 2022-06-28 MED ORDER — EPHEDRINE SULFATE-NACL 50-0.9 MG/10ML-% IV SOSY
PREFILLED_SYRINGE | INTRAVENOUS | Status: DC | PRN
  Administered 2022-06-28 (×2): 5 mg via INTRAVENOUS

## 2022-06-28 MED ORDER — ONDANSETRON HCL 4 MG PO TABS: 4.0000 mg | ORAL_TABLET | Freq: Three times a day (TID) | ORAL | 0 refills | Status: DC | PRN

## 2022-06-28 MED ORDER — OXYCODONE-ACETAMINOPHEN 10-325 MG PO TABS: 1.0000 | ORAL_TABLET | Freq: Four times a day (QID) | ORAL | 0 refills | Status: AC | PRN

## 2022-06-28 MED ORDER — AMISULPRIDE (ANTIEMETIC) 5 MG/2ML IV SOLN: 10.0000 mg | Freq: Once | INTRAVENOUS | Status: DC | PRN

## 2022-06-28 MED ORDER — LIDOCAINE 2% (20 MG/ML) 5 ML SYRINGE
INTRAMUSCULAR | Status: DC | PRN
  Administered 2022-06-28: 80 mg via INTRAVENOUS

## 2022-06-28 SURGICAL SUPPLY — 56 items
BAG COUNTER SPONGE SURGICOUNT (BAG) ×1 IMPLANT
BNDG GAUZE DERMACEA FLUFF 4 (GAUZE/BANDAGES/DRESSINGS) ×1 IMPLANT
CANISTER SUCT 3000ML PPV (MISCELLANEOUS) ×1 IMPLANT
CLSR STERI-STRIP ANTIMIC 1/2X4 (GAUZE/BANDAGES/DRESSINGS) ×1 IMPLANT
CNTNR URN SCR LID CUP LEK RST (MISCELLANEOUS) IMPLANT
CONT SPEC 4OZ STRL OR WHT (MISCELLANEOUS) ×1
CORD BIPOLAR FORCEPS 12FT (ELECTRODE) ×1 IMPLANT
COVER SURGICAL LIGHT HANDLE (MISCELLANEOUS) ×1 IMPLANT
DRAIN CHANNEL 15F RND FF W/TCR (WOUND CARE) IMPLANT
DRAPE SURG 17X23 STRL (DRAPES) ×1 IMPLANT
DRAPE U-SHAPE 47X51 STRL (DRAPES) ×1 IMPLANT
DRSG OPSITE POSTOP 3X4 (GAUZE/BANDAGES/DRESSINGS) ×1 IMPLANT
DRSG OPSITE POSTOP 4X6 (GAUZE/BANDAGES/DRESSINGS) IMPLANT
DURAPREP 26ML APPLICATOR (WOUND CARE) ×1 IMPLANT
ELECT BLADE 4.0 EZ CLEAN MEGAD (MISCELLANEOUS)
ELECT CAUTERY BLADE 6.4 (BLADE) ×1 IMPLANT
ELECT PENCIL ROCKER SW 15FT (MISCELLANEOUS) ×1 IMPLANT
ELECT REM PT RETURN 9FT ADLT (ELECTROSURGICAL) ×1
ELECTRODE BLDE 4.0 EZ CLN MEGD (MISCELLANEOUS) IMPLANT
ELECTRODE REM PT RTRN 9FT ADLT (ELECTROSURGICAL) ×1 IMPLANT
EVACUATOR SILICONE 100CC (DRAIN) IMPLANT
GLOVE BIO SURGEON STRL SZ 6.5 (GLOVE) ×1 IMPLANT
GLOVE BIOGEL PI IND STRL 6.5 (GLOVE) ×1 IMPLANT
GLOVE BIOGEL PI IND STRL 8.5 (GLOVE) ×1 IMPLANT
GLOVE SS BIOGEL STRL SZ 8.5 (GLOVE) ×1 IMPLANT
GOWN STRL REUS W/ TWL LRG LVL3 (GOWN DISPOSABLE) ×2 IMPLANT
GOWN STRL REUS W/TWL 2XL LVL3 (GOWN DISPOSABLE) ×1 IMPLANT
GOWN STRL REUS W/TWL LRG LVL3 (GOWN DISPOSABLE)
KIT BASIN OR (CUSTOM PROCEDURE TRAY) ×1 IMPLANT
KIT TURNOVER KIT B (KITS) ×1 IMPLANT
NDL 22X1.5 STRL (OR ONLY) (MISCELLANEOUS) ×1 IMPLANT
NDL SPNL 18GX3.5 QUINCKE PK (NEEDLE) ×2 IMPLANT
NEEDLE 22X1.5 STRL (OR ONLY) (MISCELLANEOUS) ×1 IMPLANT
NEEDLE SPNL 18GX3.5 QUINCKE PK (NEEDLE) ×2 IMPLANT
NS IRRIG 1000ML POUR BTL (IV SOLUTION) ×1 IMPLANT
PACK LAMINECTOMY ORTHO (CUSTOM PROCEDURE TRAY) ×1 IMPLANT
PACK UNIVERSAL I (CUSTOM PROCEDURE TRAY) ×1 IMPLANT
PAD ARMBOARD 7.5X6 YLW CONV (MISCELLANEOUS) ×2 IMPLANT
PATTIES SURGICAL .5 X.5 (GAUZE/BANDAGES/DRESSINGS) ×1 IMPLANT
PATTIES SURGICAL .5 X1 (DISPOSABLE) ×1 IMPLANT
SPONGE SURGIFOAM ABS GEL 100 (HEMOSTASIS) IMPLANT
SPONGE T-LAP 4X18 ~~LOC~~+RFID (SPONGE) ×3 IMPLANT
SURGIFLO W/THROMBIN 8M KIT (HEMOSTASIS) IMPLANT
SUT BONE WAX W31G (SUTURE) ×1 IMPLANT
SUT MNCRL AB 3-0 PS2 18 (SUTURE) IMPLANT
SUT MNCRL+ AB 3-0 CT1 36 (SUTURE) ×1 IMPLANT
SUT MONOCRYL AB 3-0 CT1 36IN (SUTURE) ×2
SUT VIC AB 1 CT1 18XCR BRD 8 (SUTURE) ×1 IMPLANT
SUT VIC AB 1 CT1 8-18 (SUTURE) ×2
SUT VIC AB 2-0 CT1 18 (SUTURE) ×1 IMPLANT
SYR BULB IRRIG 60ML STRL (SYRINGE) ×1 IMPLANT
SYR CONTROL 10ML LL (SYRINGE) ×1 IMPLANT
TOWEL GREEN STERILE (TOWEL DISPOSABLE) ×1 IMPLANT
TOWEL GREEN STERILE FF (TOWEL DISPOSABLE) ×1 IMPLANT
WATER STERILE IRR 1000ML POUR (IV SOLUTION) ×1 IMPLANT
YANKAUER SUCT BULB TIP NO VENT (SUCTIONS) IMPLANT

## 2022-06-28 NOTE — Discharge Instructions (Signed)

## 2022-06-28 NOTE — Anesthesia Postprocedure Evaluation (Signed)
Anesthesia Post Note  Patient: William [name]  Procedure(s) Performed: L4-5 decompression with removal of synovial cyst     Patient location during evaluation: PACU Anesthesia Type: General Level of consciousness: awake and alert Pain management: pain level controlled Vital Signs Assessment: post-procedure vital signs reviewed and stable Respiratory status: spontaneous breathing, nonlabored ventilation, respiratory function stable and patient connected to nasal cannula oxygen Cardiovascular status: blood pressure returned to baseline and stable Postop Assessment: no apparent nausea or vomiting Anesthetic complications: no   No notable events documented.  Last Vitals:  Vitals:   06/28/22 1126 06/28/22 1628  BP: (!) 157/72 (!) 169/87  Pulse: 61 73  Resp: 19 20  Temp: 36.7 C 36.5 C  SpO2: 97% 95%    Last Pain:  Vitals:   06/28/22 1635  TempSrc:   PainSc: [place] Kennede Lusk

## 2022-06-28 NOTE — Brief Op Note (Signed)
06/28/2022  9:32 AM  PATIENT:  William [name]  71 y.o. male  PRE-OPERATIVE DIAGNOSIS:  L4-5 spinal stenosis with right facet cyst  POST-OPERATIVE DIAGNOSIS:  L4-5 spinal stenosis with right facet cyst  PROCEDURE:  Procedure(s) with comments: L4-5 decompression with removal of synovial cyst (N/A) - 2.5 hrs 3 C-Bed  SURGEON:  Surgeon(s) and Role:    Melina Schools, MD - Primary  PHYSICIAN ASSISTANT:   ASSISTANTS: none   ANESTHESIA:   general  EBL:  50 mL   BLOOD ADMINISTERED:none  DRAINS: none   LOCAL MEDICATIONS USED:  MARCAINE     SPECIMEN:  Source of Specimen:  L4/5 facet cyst  DISPOSITION OF SPECIMEN:  PATHOLOGY  COUNTS:  YES  TOURNIQUET:  * No tourniquets in log *  DICTATION: .Dragon Dictation  PLAN OF CARE: Admit for overnight observation  PATIENT DISPOSITION:  PACU - hemodynamically stable.

## 2022-06-28 NOTE — OR Nursing (Signed)
Placement of probe confirmed at L 4-5 by Dr. Maurine Simmering Radiologist via telephone.

## 2022-06-28 NOTE — Op Note (Signed)
OPERATIVE REPORT  DATE OF SURGERY: 06/28/2022  PATIENT NAME:  William [name] MRN: [id number] DOB: [birth year]  PCP: Cassandria Anger, MD  PRE-OPERATIVE DIAGNOSIS: L4-5 right synovial cyst with L5 radiculopathy  POST-OPERATIVE DIAGNOSIS: Same  PROCEDURE:   L4-5 removal of synovial cyst (medial facetectomy, foraminotomy, and L4  hemilaminotomy)  SURGEON:  Melina Schools, MD  PHYSICIAN ASSISTANT: None  ANESTHESIA:   General  EBL: 50 ml   Complications: None  BRIEF HISTORY: William [name] is a 71 y.o. male who presents to my office with complaints of severe radicular right leg pain.  Imaging studies demonstrated a synovial cyst causing nerve compression at L4-5.  As result of the failure of conservative management to improve his quality of life we elected to move forward with surgery.  All appropriate risks, benefits, and alternative surgery were discussed and the consent was obtained.  PROCEDURE DETAILS: Patient was brought into the operating room and was properly positioned on the operating room table.  After induction with general anesthesia the patient was endotracheally intubated.  A timeout was taken to confirm all important data: including patient, procedure, and the level. Teds, SCD's were applied.   Patient was turned prone onto the Wilson frame and all bony prominences well-padded.  2 needles were then placed into the back and an x-ray was taken to localize our skin incision.  The skin incision was then infiltrated with quarter percent Marcaine with epinephrine.  Midline incision was made and sharp dissection was carried out down to the deep fascia.  I incised the deep fascia and stripped the paraspinal muscles to expose the L4 and L5 lamina and the L4-5 facet complex of the a Penfield 4 was placed underneath the L4 lamina and an x-ray was taken.  Once I confirmed I was at the appropriate level and L4 MB laminotomy was performed with a Kerrison rongeur.  I then resected the  medial portion of the L4 inferior facet.  Is at this time that the radiologist called back and said that the marker was pointing more towards the L5 pedicle and requested a second film.  Penfield 4 was then placed and an x-ray was taken.  Gaspar Garbe was now right over the L4-5 disc space and was read by the radiologist confirming the level.  At this point with the hemilaminotomy and medial facetectomy complete I then dissected through the ligamentum flavum with my Penfield 4 and created a plane between the thecal sac and the dura.  I then resected the ligamentum flavum and proceeded down into the lateral recess.  I then gently dissected the L5 nerve root away from the medial aspect of the L5 superior facet and resected the L5 medial facet to complete my lateral recess decompression.  I can now visualize the medial border of the L5 pedicle.  A 2 mm Kerrison rongeur was then passed into the L5 foramen and a partial foraminotomies was performed to further decompress the L5 nerve root.  I then turned my attention cephalad.  I could now visualize the facet cyst.  I began to gently dissect lateral to the facet cyst performing more of a medial facetectomy and hemilaminotomy.  At this point I can now completely visualized the entire facet cyst.  I continued with the [facility] 4 gently dissecting the cyst away from the thecal sac.  Once I had completely dissected I removed an en bloc and sent it to pathology for further analysis.  At this point I felt as I had an  adequate decompression.  The epidural veins in the lateral recess were then coagulated.  The wound was then copiously irrigated with normal saline.  I was able to now freely pass my nerve hook superiorly into the lateral recess towards the L4 foramen medially along the undersurface and laterally in the lateral recess and out the L5 foramen.  The L5 nerve root was freely mobile.  At this point I remove the retractor and then closed the deep fascia with interrupted #1  Vicryl suture.  I then closed superficially with interrupted 2-0 Vicryl suture and finally a 3-0 Monocryl for the skin.  Steri-Strips and dry dressings were applied and the patient was ultimately extubated transfer the PACU without incident.  The end of the case all needle and sponge counts were correct.  There were no adverse intraoperative events.  Melina Schools, MD 06/28/2022 9:19 AM

## 2022-06-28 NOTE — Transfer of Care (Signed)
Immediate Anesthesia Transfer of Care Note  Patient: [name]  Procedure(s) Performed: L4-5 decompression with removal of synovial cyst  Patient Location: PACU  Anesthesia Type:General  Level of Consciousness: awake, alert , and oriented  Airway & Oxygen Therapy: Patient Spontanous Breathing and Patient connected to face mask oxygen  Post-op Assessment: Report given to RN, Post -op Vital signs reviewed and stable, Patient moving all extremities, and Patient moving all extremities X 4  Post vital signs: Reviewed and stable  Last Vitals:  Vitals Value Taken Time  BP 137/63 06/28/22 0936  Temp 37.4 C 06/28/22 0936  Pulse 62 06/28/22 0937  Resp 15 06/28/22 0937  SpO2 100 % 06/28/22 0937  Vitals shown include unvalidated device data.  Last Pain:  Vitals:   06/28/22 0936  TempSrc: Tympanic  PainSc:          Complications: No notable events documented.

## 2022-06-28 NOTE — H&P (Signed)
History:  William [name] continues to have severe back buttock and radicular leg pain due to the synovial cyst compressing the nerve root. As a result of the severe pain and the loss in quality of life we have elected to move forward with the lumbar decompression and excision of the synovial cyst.  Surgical procedure: L4-5 decompression w/ removal of cyst  Past Medical History:  Diagnosis Date   Allergic rhinitis    Anemia, iron deficiency    Arthritis    BPH associated with nocturia    ED (erectile dysfunction)    GERD (gastroesophageal reflux disease)    Hiatal hernia    History of COVID-19 11/2020   per pt moderate symptoms that resovled   History of hypertension    followed by pcp  (per pt resolved no medication since approx 2017)   History of kidney stones    Left ureteral calculus    Parkinson disease    neurologist--- dr tat;   mild jaw dyskinesia,   gait disorder, tremors   Peyronie's disease    S/P dilatation of esophageal stricture    Trigeminal neuropathy    left side   Ulcerative colitis    followed by dr Henrene Pastor (GI)//   1992 s/p  total abdominal colectomy with ileoanal anastomosis   Wears glasses     No Known Allergies  No current facility-administered medications on file prior to encounter.   Current Outpatient Medications on File Prior to Encounter  Medication Sig Dispense Refill   baclofen (LIORESAL) 10 MG tablet TAKE 1 TABLET BY MOUTH THREE TIMES A DAY 270 tablet 1   carbidopa-levodopa (SINEMET IR) 25-100 MG tablet TAKE 2 TABLETS DAILY AT 7 AM,AND 2 TABLETS AT 11 AM,AND 2 TABLETS AT 4 PM 540 tablet 0   Ferrous Sulfate 27 MG TABS Take 27 mg by mouth 3 (three) times daily with meals.     ibuprofen (ADVIL) 200 MG tablet Take 200-400 mg by mouth every 6 (six) hours as needed for moderate pain.     omeprazole (PRILOSEC) 40 MG capsule Take 1 capsule (40 mg total) by mouth 2 (two) times daily. 180 capsule 3   pregabalin (LYRICA) 150 MG capsule Take 150 mg by mouth 3  (three) times daily.     psyllium (METAMUCIL) 58.6 % powder Take 1 packet by mouth 2 (two) times daily. 1 TBS Twice Daily.     rotigotine (NEUPRO) 6 MG/24HR Place 1 patch onto the skin daily. 90 patch 1   sildenafil (VIAGRA) 100 MG tablet Take 100 mg by mouth daily as needed for erectile dysfunction.     traMADol (ULTRAM) 50 MG tablet Take 1-2 tablets (50-100 mg total) by mouth every 6 (six) hours as needed. 120 tablet 1   carbamazepine (TEGRETOL) 200 MG tablet Take 0.5-1 tablets (100-200 mg total) by mouth 3 (three) times daily. (Patient not taking: Reported on 03/30/2022) 180 tablet 2   pregabalin (LYRICA) 75 MG capsule Take 1 capsule (75 mg total) by mouth 2 (two) times daily. (Patient not taking: Reported on 06/11/2022) 60 capsule 1    Physical Exam: Vitals:   06/28/22 0552  BP: 128/74  Pulse: 81  Resp: 18  Temp: 98.1 F (36.7 C)  SpO2: 94%   Body mass index is 27.19 kg/m. Clinical exam: [name] is a pleasant individual, who appears younger than their stated age.71  He is alert and orientated 3.  No shortness of breath, chest pain.  Abdomen is soft and non-tender, negative  loss of bowel and bladder control, no rebound tenderness.  Negative: skin lesions abrasions contusions  Peripheral pulses: 2+ peripheral pulses bilaterally. LE compartments are: Soft and nontender.  Gait pattern: Normal  Assistive devices: [default value]  Neuro: 5/5 motor strength in the lower extremity bilaterally. Positive intermittent dysesthesias into the lower extremity right side. positive right straight leg raise test, no clonus, negative Babinski test. Symmetrical 1+ deep tendon reflexes at knee absent at the Achilles.  Musculoskeletal: Significant back pain radiating into the right lower extremity. No hip, knee, ankle pain with isolated joint range of motion, no SI joint pain.  Imaging: X-rays of the lumbar spine demonstrate normal sagittal alignment. Mild degenerative changes noted throughout the  lumbar spine.  Lumbar MRI: completed on 03/17/2022: Severe spinal stenosis at L4-5 with large right-sided facet cyst contributing to stenosis. There is severe right foraminal and lateral recess stenosis affecting the exiting L4 and traversing L5 nerve root. Mild degenerative changes L3-4, and L5-S1. No fracture noted. No suspicious marrow signal abnormality.   A/P: Diagnosis: William [name] is a very pleasant 71 year old gentleman with lumbar spinal stenosis with a right synovial cyst causing nerve compression. Patient has neurogenic claudication with right radicular leg pain being more prominent than the left. At this point he would like to move forward with a surgical solution for his pain. Based on his imaging studies and clinical presentation I think the best option is an L4-5 lumbar decompression. Patient has underlying Parkinson's disease and so I do not feel as though he is a good candidate for an outpatient surgical center. We will plan on doing this in the main hospital. I do not think he will require more than a 23-hour admission. I have gone over the surgical procedure with the patient and his wife and all their questions were addressed.    Risks and benefits of decompression/discectomy: Infection, bleeding, death, stroke, paralysis, ongoing or worse pain, need for additional surgery, leak of spinal fluid, adjacent segment degeneration requiring additional surgery, post-operative hematoma formation that can result in neurological compromise and the need for urgent/emergent re-operation. Loss in bowel and bladder control. Injury to major vessels that could result in the need for urgent abdominal surgery to stop bleeding. Risk of deep venous thrombosis (DVT) and the need for additional treatment. Recurrent disc herniation resulting in the need for revision surgery, which could include fusion surgery (utilizing instrumentation such as pedicle screws and intervertebral cages).  Additional risk: If instrumentation  is used there is a risk of migration, or breakage of that hardware that could require additional surgery.

## 2022-06-29 ENCOUNTER — Encounter (HOSPITAL_COMMUNITY): Payer: Self-pay | Admitting: Orthopedic Surgery

## 2022-06-29 DIAGNOSIS — I10 Essential (primary) hypertension: Secondary | ICD-10-CM | POA: Diagnosis not present

## 2022-06-29 DIAGNOSIS — Z79899 Other long term (current) drug therapy: Secondary | ICD-10-CM | POA: Diagnosis not present

## 2022-06-29 DIAGNOSIS — M7138 Other bursal cyst, other site: Secondary | ICD-10-CM | POA: Diagnosis not present

## 2022-06-29 DIAGNOSIS — M5416 Radiculopathy, lumbar region: Secondary | ICD-10-CM | POA: Diagnosis not present

## 2022-06-29 DIAGNOSIS — Z8616 Personal history of COVID-19: Secondary | ICD-10-CM | POA: Diagnosis not present

## 2022-06-29 DIAGNOSIS — M48061 Spinal stenosis, lumbar region without neurogenic claudication: Secondary | ICD-10-CM | POA: Diagnosis not present

## 2022-06-29 DIAGNOSIS — G20C Parkinsonism, unspecified: Secondary | ICD-10-CM | POA: Diagnosis not present

## 2022-06-29 LAB — SURGICAL PATHOLOGY

## 2022-06-29 NOTE — Progress Notes (Signed)
Patient alert and oriented, mae's well, voiding adequate amount of urine, swallowing without difficulty, no c/o pain at time of discharge. Patient discharged home with family. Script and discharged instructions given to patient. Patient and family stated understanding of instructions given. Patient has an appointment with Dr. Rolena Infante in 2 weeks. Patient awaiting equipment from [employer] to go home

## 2022-06-29 NOTE — Evaluation (Signed)
Occupational Therapy Evaluation and Discharge Patient Details Name: William [name] MRN: [id number] DOB: April 27, 1951 Today's Date: 06/29/2022   History of Present Illness 71 yo male s/p L4-5 removal of synovial cyst (medial facetectomy, foraminotomy, and L4  hemilaminotomy). PMH including arthrits, parkinson's disease, Trigeminal neuropathy (L side).   Clinical Impression   PTA, pt was living with his wife and was independent. Currently, pt performing at Supervision level for ADLs and functional mobility using RW; with exception of socks and shoes. Provided education and handout on back precautions, bed mobility, grooming, brace management, UB ADLs, LB ADLs, toilet transfer, and shower transfer with shower seat; pt demonstrated understanding. Answered all pt questions. Recommend dc home once medically stable per physician. All acute OT needs met and will sign off. Thank you.      Recommendations for follow up therapy are one component of a multi-disciplinary discharge planning process, led by the attending physician.  Recommendations may be updated based on patient status, additional functional criteria and insurance authorization.   Follow Up Recommendations  No OT follow up    Assistance Recommended at Discharge Frequent or constant Supervision/Assistance  Patient can return home with the following      Functional Status Assessment  Patient has had a recent decline in their functional status and demonstrates the ability to make significant improvements in function in a reasonable and predictable amount of time.  Equipment Recommendations  None recommended by OT    Recommendations for Other Services       Precautions / Restrictions Precautions Precautions: Fall;Back Precaution Booklet Issued: Yes (comment) Required Braces or Orthoses: Spinal Brace Spinal Brace: Lumbar corset;Applied in sitting position Restrictions Weight Bearing Restrictions: No      Mobility Bed  Mobility Overal bed mobility: Needs Assistance Bed Mobility: Rolling, Sidelying to Sit Rolling: Min assist Sidelying to sit: Min guard       General bed mobility comments: Min A for facilitating hips and shoulders over to sidelying and then min guard A for safety to transition to sitting    Transfers Overall transfer level: Needs assistance Equipment used: Rolling walker (2 wheels), None Transfers: Sit to/from Stand Sit to Stand: Min guard           General transfer comment: min guard A for safety      Balance Overall balance assessment: Needs assistance Sitting-balance support: No upper extremity supported, Feet supported Sitting balance-Leahy Scale: Good     Standing balance support: No upper extremity supported, During functional activity, Bilateral upper extremity supported Standing balance-Leahy Scale: Fair                             ADL either performed or assessed with clinical judgement   ADL Overall ADL's : Needs assistance/impaired                                       General ADL Comments: Pt performing at Supervision level. Providing education on compensatory techniques for bed mobility, UB ADLs, LB ADLs, grooming, toileting, and shower seat. Pt will need increased assistance for socks and shoes but reports his wife will help     Vision Baseline Vision/History: 1 Wears glasses       Perception     Praxis      Pertinent Vitals/Pain Pain Assessment Pain Assessment: Faces Faces Pain Scale: Hurts little more Pain Location:  Back Pain Descriptors / Indicators: Discomfort, Grimacing Pain Intervention(s): Monitored during session, Repositioned     Hand Dominance     Extremity/Trunk Assessment Upper Extremity Assessment Upper Extremity Assessment: Overall WFL for tasks assessed (Tremors at baseline)   Lower Extremity Assessment Lower Extremity Assessment: Defer to PT evaluation   Cervical / Trunk Assessment Cervical  / Trunk Assessment: Back Surgery   Communication Communication Communication: No difficulties   Cognition Arousal/Alertness: Awake/alert Behavior During Therapy: WFL for tasks assessed/performed Overall Cognitive Status: Within Functional Limits for tasks assessed                                       General Comments  Disucssing in length about his school reunion this saturday and what type of chair to bring    Exercises     Shoulder Instructions      Home Living Family/patient expects to be discharged to:: Private residence Living Arrangements: Spouse/significant other;Children (Wife and daughter) Available Help at Discharge: Family;Available 24 hours/day Type of Home: House Home Access: Stairs to enter [employer] of Steps: 3 Entrance Stairs-Rails: Right Home Layout: Two level;Able to live on main level with bedroom/bathroom     Bathroom Shower/Tub: Occupational psychologist: Standard     Home Equipment: Shower seat;Hand held shower head          Prior Functioning/Environment Prior Level of Function : Independent/Modified Independent                        OT Problem List: Decreased strength;Decreased range of motion;Impaired balance (sitting and/or standing);Decreased activity tolerance;Decreased knowledge of use of DME or AE;Decreased knowledge of precautions      OT Treatment/Interventions:      OT Goals(Current goals can be found in the care plan section) Acute Rehab OT Goals Patient Stated Goal: Go home OT Goal Formulation: All assessment and education complete, DC therapy  OT Frequency:      Co-evaluation              AM-PAC OT "6 Clicks" Daily Activity     Outcome Measure Help from another person eating meals?: A Little Help from another person taking care of personal grooming?: A Little Help from another person toileting, which includes using toliet, bedpan, or urinal?: A Little Help from another  person bathing (including washing, rinsing, drying)?: A Little Help from another person to put on and taking off regular upper body clothing?: A Little Help from another person to put on and taking off regular lower body clothing?: A Little 6 Click Score: 18   End of Session Equipment Utilized During Treatment: Rolling walker (2 wheels);Back brace Nurse Communication: Mobility status  Activity Tolerance: Patient tolerated treatment well Patient left: in chair;with call bell/phone within reach  OT Visit Diagnosis: Unsteadiness on feet (R26.81);Other abnormalities of gait and mobility (R26.89);Muscle weakness (generalized) (M62.81)                Time: 6599-3570 OT Time Calculation (min): 30 min Charges:  OT General Charges $OT Visit: 1 Visit OT Evaluation $OT Eval Low Complexity: 1 Low OT Treatments $Self Care/Home Management : 8-22 mins    MSOT, OTR/L Acute Rehab Office: [place] 06/29/2022, 8:40 AM

## 2022-06-29 NOTE — Discharge Summary (Signed)
Patient ID: NOELL SHULAR MRN: [id number] DOB/AGE: [birth year] 71 y.o.  Admit date: 06/28/2022 Discharge date: 06/29/2022  Admission Diagnoses:  Principal Problem:   Synovial cyst of lumbar facet joint   Discharge Diagnoses:  Principal Problem:   Synovial cyst of lumbar facet joint  status post Procedure(s): L4-5 decompression with removal of synovial cyst  Past Medical History:  Diagnosis Date   Allergic rhinitis    Anemia, iron deficiency    Arthritis    BPH associated with nocturia    ED (erectile dysfunction)    GERD (gastroesophageal reflux disease)    Hiatal hernia    History of COVID-19 11/2020   per pt moderate symptoms that resovled   History of hypertension    followed by pcp  (per pt resolved no medication since approx 2017)   History of kidney stones    Left ureteral calculus    Parkinson disease    neurologist--- dr tat;   mild jaw dyskinesia,   gait disorder, tremors   Peyronie's disease    S/P dilatation of esophageal stricture    Trigeminal neuropathy    left side   Ulcerative colitis    followed by dr Henrene Pastor (GI)//   1992 s/p  total abdominal colectomy with ileoanal anastomosis   Wears glasses     Surgeries: Procedure(s): L4-5 decompression with removal of synovial cyst on 06/28/2022   Consultants:   Discharged Condition: Improved  Hospital Course: MARRIS FRONTERA is an 71 y.o. male who was admitted 06/28/2022 for operative treatment of Synovial cyst of lumbar facet joint. Patient failed conservative treatments (please see the history and physical for the specifics) and had severe unremitting pain that affects sleep, daily activities and work/hobbies. After pre-op clearance, the patient was taken to the operating room on 06/28/2022 and underwent  Procedure(s): L4-5 decompression with removal of synovial cyst.    Patient was given perioperative antibiotics:  Anti-infectives (From admission, onward)    Start     Dose/Rate Route Frequency  Ordered Stop   06/28/22 1600  ceFAZolin (ANCEF) IVPB 1 g/50 mL premix        1 g 100 mL/hr over 30 Minutes Intravenous Every 8 hours 06/28/22 1030 06/28/22 2335   06/28/22 0610  ceFAZolin (ANCEF) 2-4 GM/100ML-% IVPB       Note to Pharmacy: Rocky Morel D: cabinet override      06/28/22 0610 06/28/22 0802   06/28/22 0605  ceFAZolin (ANCEF) IVPB 2g/100 mL premix        2 g 200 mL/hr over 30 Minutes Intravenous 30 min pre-op 06/28/22 0102 06/28/22 0801        Patient was given sequential compression devices and early ambulation to prevent DVT.   Patient benefited maximally from hospital stay and there were no complications. At the time of discharge, the patient was urinating/moving their bowels without difficulty, tolerating a regular diet, pain is controlled with oral pain medications and they have been cleared by PT/OT.   Recent vital signs: Patient Vitals for the past 24 hrs:  BP Temp Temp src Pulse Resp SpO2  06/29/22 0728 105/79 97.9 F (36.6 C) Oral 70 16 96 %  06/29/22 0326 (!) 180/84 97.6 F (36.4 C) Oral (!) 58 20 98 %  06/28/22 2310 (!) 159/85 97.6 F (36.4 C) Oral 70 20 95 %  06/28/22 1927 (!) 144/76 97.6 F (36.4 C) Oral 91 20 95 %  06/28/22 1628 (!) 169/87 97.7 F (36.5 C) Oral 73 20 95 %  06/28/22 1126 (!) 157/72 98 F (36.7 C) -- 61 19 97 %  06/28/22 1100 (!) 144/85 97.9 F (36.6 C) -- (!) 57 15 92 %  06/28/22 1045 (!) 141/58 -- -- 60 17 92 %  06/28/22 1030 137/77 -- -- (!) 59 16 95 %  06/28/22 1015 (!) 148/77 -- -- (!) 59 18 94 %  06/28/22 1000 (!) 160/75 -- -- 61 14 93 %  06/28/22 0945 (!) 121/91 -- -- 61 15 96 %  06/28/22 0936 137/63 99.3 F (37.4 C) Tympanic (!) 58 (!) 21 100 %     Recent laboratory studies: No results for input(s): "WBC", "HGB", "HCT", "PLT", "NA", "K", "CL", "CO2", "BUN", "CREATININE", "GLUCOSE", "INR", "CALCIUM" in the last 72 hours.  Invalid input(s): "PT", "2"   Discharge Medications:   Allergies as of 06/29/2022   No Known  Allergies      Medication List     STOP taking these medications    baclofen 10 MG tablet Commonly known as: LIORESAL   carbamazepine 200 MG tablet Commonly known as: TEGretol   ibuprofen 200 MG tablet Commonly known as: ADVIL   pregabalin 150 MG capsule Commonly known as: LYRICA   pregabalin 75 MG capsule Commonly known as: Lyrica   sildenafil 100 MG tablet Commonly known as: VIAGRA   traMADol 50 MG tablet Commonly known as: ULTRAM       TAKE these medications    carbidopa-levodopa 25-100 MG tablet Commonly known as: SINEMET IR TAKE 2 TABLETS DAILY AT 7 AM,AND 2 TABLETS AT 11 AM,AND 2 TABLETS AT 4 PM   Ferrous Sulfate 27 MG Tabs Take 27 mg by mouth 3 (three) times daily with meals.   methocarbamol 500 MG tablet Commonly known as: ROBAXIN Take 1 tablet (500 mg total) by mouth every 8 (eight) hours as needed for up to 5 days for muscle spasms.   Neupro 6 MG/24HR Generic drug: rotigotine Place 1 patch onto the skin daily.   omeprazole 40 MG capsule Commonly known as: PRILOSEC Take 1 capsule (40 mg total) by mouth 2 (two) times daily.   ondansetron 4 MG tablet Commonly known as: Zofran Take 1 tablet (4 mg total) by mouth every 8 (eight) hours as needed for nausea or vomiting.   oxyCODONE-acetaminophen 10-325 MG tablet Commonly known as: Percocet Take 1 tablet by mouth every 6 (six) hours as needed for up to 5 days for pain.   psyllium 58.6 % powder Commonly known as: METAMUCIL Take 1 packet by mouth 2 (two) times daily. 1 TBS Twice Daily.        Diagnostic Studies: DG Lumbar Spine 1 View  Result Date: 06/28/2022 CLINICAL DATA:  [id number] Surgery, elective [id number] EXAM: LUMBAR SPINE - 1 VIEW COMPARISON:  Radiograph 06/28/2022 FINDINGS: Intraoperative lateral radiograph of the lumbar spine. Instrument localization is at L4-L5. IMPRESSION: Instrument localization at L4-L5. These results were called by telephone at the time of interpretation on 06/28/2022  at 8:42 am to provider [facility] Diyan Dave relayed by OR staff, who verbally acknowledged these results. Electronically Signed   By: Maurine Simmering M.D.   On: 06/28/2022 08:42   DG Lumbar Spine 2-3 Views  Result Date: 06/28/2022 CLINICAL DATA:  Intraoperative localization for L4-5 decompression EXAM: LUMBAR SPINE - 2 VIEW COMPARISON:  CT pelvis dated 10/19/2005 FINDINGS: Two lateral views of the lumbar spine demonstrate preserved spinal alignment and mild narrowing of the L5-S1 intervertebral disc space. Vertebral body heights are preserved. On the second image, there is a  partially imaged retractor projecting over the level of L5 with curved probe extending towards the lumbar spine at the level of the mid L5 vertebral body with tip curving gently towards the L4-5 disc space. IMPRESSION: Intraoperative lateral views of the lumbar spine with metallic probe at the level of L5 and tip curving towards the L4-5 disc space. These results were called by telephone at the time of interpretation on 06/28/2022 at 8:33 am to provider [facility] , who verbally acknowledged these results. Electronically Signed   By: Darrin Nipper M.D.   On: 06/28/2022 08:34    Discharge Instructions     Incentive spirometry RT   Complete by: As directed         Follow-up Information     Melina Schools, MD. Schedule an appointment as soon as possible for a visit in 2 week(s).   Specialty: Orthopedic Surgery Why: If symptoms worsen, For suture removal, For wound re-check Contact information: [address] [place] [address] [phone]                 Discharge Plan:  discharge to home  Disposition: Patient is ambulating without significant radicular leg pain.  The incision are clean dry and intact no drainage.  His pain is well controlled with oral medications.  Patient is tolerating a regular diet voiding spontaneously.  Overall he is done quite well status post excision of the synovial cyst.  Will be  discharged home and follow-up with me in 2 weeks for a wound check.  All appropriate instructions and medications have been provided.    Signed: Dahlia Bailiff for Dr. Melina Schools [facility] [phone] 06/29/2022, 8:20 AM

## 2022-06-29 NOTE — Evaluation (Addendum)
Physical Therapy Evaluation Patient Details Name: William [name] MRN: [id number] DOB: [birth year] Today's Date: 06/29/2022  History of Present Illness  71 yo male s/p L4-5 removal of synovial cyst (medial facetectomy, foraminotomy, and L4  hemilaminotomy). PMH including arthrits, parkinson's disease, Trigeminal neuropathy (L side).  Clinical Impression  PTA pt living with wife in single story home with 3 steps with rails to enter. Pt reports independence with mobility, ADLs and iADLs. Pt is currently min guard for transfers and ambulation with RW. Pt requires light min A for steadying with ascending/descending stairs. Pt provided education and demonstration on car transfers. Plan for discharge home this morning, however PT will continue to follow acutely until d/c.      Recommendations for follow up therapy are one component of a multi-disciplinary discharge planning process, led by the attending physician.  Recommendations may be updated based on patient status, additional functional criteria and insurance authorization.  Follow Up Recommendations Follow physician's recommendations for discharge plan and follow up therapies      Assistance Recommended at Discharge Frequent or constant Supervision/Assistance  Patient can return home with the following  A little help with walking and/or transfers;A little help with bathing/dressing/bathroom;Assistance with cooking/housework;Assist for transportation;Help with stairs or ramp for entrance    Equipment Recommendations Rolling walker (2 wheels)     Functional Status Assessment Patient has had a recent decline in their functional status and demonstrates the ability to make significant improvements in function in a reasonable and predictable amount of time.     Precautions / Restrictions Precautions Precautions: Fall;Back Precaution Booklet Issued: Yes (comment) Required Braces or Orthoses: Spinal Brace Spinal Brace: Lumbar corset;Applied in  sitting position Restrictions Weight Bearing Restrictions: No      Mobility  Bed Mobility               General bed mobility comments: up in recliner on entry    Transfers Overall transfer level: Needs assistance Equipment used: Rolling walker (2 wheels), None Transfers: Sit to/from Stand Sit to Stand: Min guard           General transfer comment: min guard A for safety    Ambulation/Gait Ambulation/Gait assistance: Min guard Gait Distance (Feet): 200 Feet Assistive device: Rolling walker (2 wheels) Gait Pattern/deviations: Step-through pattern, Decreased step length - right, Decreased step length - left, Festinating, Decreased dorsiflexion - right Gait velocity: slowed Gait velocity interpretation: <1.8 ft/sec, indicate of risk for recurrent falls   General Gait Details: min guard for safety, pt with slight R foot drop and decreased foot clearance.  vc for proximity to RW and not using RW to turn around and sit down, and not to leave RW before sitting down,  Stairs Stairs: Yes Stairs assistance: Min assist Stair Management: Two rails, Alternating pattern, Forwards Number of Stairs: 5          Balance Overall balance assessment: Needs assistance Sitting-balance support: No upper extremity supported, Feet supported Sitting balance-Leahy Scale: Good     Standing balance support: No upper extremity supported, During functional activity, Bilateral upper extremity supported Standing balance-Leahy Scale: Fair                               Pertinent Vitals/Pain Pain Assessment Pain Assessment: Faces Faces Pain Scale: Hurts little more Pain Location: Back Pain Descriptors / Indicators: Discomfort, Grimacing Pain Intervention(s): Limited activity within patient's tolerance, Monitored during session, Premedicated before session, Repositioned  Home Living Family/patient expects to be discharged to:: Private residence Living Arrangements:  Spouse/significant other;Children (Wife and daughter) Available Help at Discharge: Family;Available 24 hours/day Type of Home: House Home Access: Stairs to enter Entrance Stairs-Rails: Right Entrance Stairs-Number of Steps: 3   Home Layout: Two level;Able to live on main level with bedroom/bathroom Home Equipment: Shower seat;Hand held shower head      Prior Function Prior Level of Function : Independent/Modified Independent                        Extremity/Trunk Assessment   Upper Extremity Assessment Upper Extremity Assessment: Defer to OT evaluation    Lower Extremity Assessment Lower Extremity Assessment: RLE deficits/detail;Generalized weakness RLE Deficits / Details: ROM WFL, ankle dorsiflexion 2+/5, plantarflexi 3+/5, knee ext 4/5, knee flex 3+/5, hip flex 4/5 RLE Sensation: WNL RLE Coordination: decreased fine motor    Cervical / Trunk Assessment Cervical / Trunk Assessment: Back Surgery  Communication   Communication: No difficulties  Cognition Arousal/Alertness: Awake/alert Behavior During Therapy: WFL for tasks assessed/performed Overall Cognitive Status: Within Functional Limits for tasks assessed                                          General Comments General comments (skin integrity, edema, etc.): Follow up about school reunion and it is not this weekend but the following weekend, reinforced preparation        Assessment/Plan    PT Assessment Patient does not need any further PT services         PT Goals (Current goals can be found in the Care Plan section)  Acute Rehab PT Goals PT Goal Formulation: With patient Time For Goal Achievement: 07/13/22 Potential to Achieve Goals: Good            AM-PAC PT "6 Clicks" Mobility  Outcome Measure Help needed turning from your back to your side while in a flat bed without using bedrails?: A Little Help needed moving from lying on your back to sitting on the side of a flat  bed without using bedrails?: None Help needed moving to and from a bed to a chair (including a wheelchair)?: None Help needed standing up from a chair using your arms (e.g., wheelchair or bedside chair)?: None Help needed to walk in hospital room?: A Little Help needed climbing 3-5 steps with a railing? : A Little 6 Click Score: 21    End of Session Equipment Utilized During Treatment: Gait belt;Back brace Activity Tolerance: Patient tolerated treatment well Patient left: in chair;with call bell/phone within reach Nurse Communication: Mobility status PT Visit Diagnosis: Unsteadiness on feet (R26.81);Muscle weakness (generalized) (M62.81);Difficulty in walking, not elsewhere classified (R26.2)    Time: 4536-4680 PT Time Calculation (min) (ACUTE ONLY): 21 min   Charges:   PT Evaluation $PT Eval Moderate Complexity: 1 Mod          Chelcy Bolda B. Migdalia Dk PT, DPT Acute Rehabilitation Services Please use secure chat or  Call Office [phone]   [place] 06/29/2022, 9:44 AM

## 2022-07-04 ENCOUNTER — Other Ambulatory Visit: Payer: Self-pay | Admitting: Neurology

## 2022-07-04 DIAGNOSIS — G20A1 Parkinson's disease without dyskinesia, without mention of fluctuations: Secondary | ICD-10-CM

## 2022-07-04 DIAGNOSIS — G20B1 Parkinson's disease with dyskinesia, without mention of fluctuations: Secondary | ICD-10-CM

## 2022-07-14 DIAGNOSIS — Z0100 Encounter for examination of eyes and vision without abnormal findings: Secondary | ICD-10-CM | POA: Diagnosis not present

## 2022-07-14 DIAGNOSIS — H2513 Age-related nuclear cataract, bilateral: Secondary | ICD-10-CM | POA: Diagnosis not present

## 2022-07-20 DIAGNOSIS — G20A1 Parkinson's disease without dyskinesia, without mention of fluctuations: Secondary | ICD-10-CM | POA: Diagnosis not present

## 2022-07-20 DIAGNOSIS — I10 Essential (primary) hypertension: Secondary | ICD-10-CM | POA: Diagnosis not present

## 2022-07-20 DIAGNOSIS — I272 Pulmonary hypertension, unspecified: Secondary | ICD-10-CM | POA: Diagnosis not present

## 2022-07-20 DIAGNOSIS — G500 Trigeminal neuralgia: Secondary | ICD-10-CM | POA: Diagnosis not present

## 2022-07-20 DIAGNOSIS — R2689 Other abnormalities of gait and mobility: Secondary | ICD-10-CM | POA: Diagnosis not present

## 2022-07-20 DIAGNOSIS — M1990 Unspecified osteoarthritis, unspecified site: Secondary | ICD-10-CM | POA: Diagnosis not present

## 2022-07-20 DIAGNOSIS — Z807 Family history of other malignant neoplasms of lymphoid, hematopoietic and related tissues: Secondary | ICD-10-CM | POA: Diagnosis not present

## 2022-07-20 DIAGNOSIS — K219 Gastro-esophageal reflux disease without esophagitis: Secondary | ICD-10-CM | POA: Diagnosis not present

## 2022-07-20 DIAGNOSIS — Z800 Family history of malignant neoplasm of digestive organs: Secondary | ICD-10-CM | POA: Diagnosis not present

## 2022-07-20 DIAGNOSIS — N529 Male erectile dysfunction, unspecified: Secondary | ICD-10-CM | POA: Diagnosis not present

## 2022-07-20 DIAGNOSIS — Z818 Family history of other mental and behavioral disorders: Secondary | ICD-10-CM | POA: Diagnosis not present

## 2022-07-20 DIAGNOSIS — Z791 Long term (current) use of non-steroidal anti-inflammatories (NSAID): Secondary | ICD-10-CM | POA: Diagnosis not present

## 2022-07-28 DIAGNOSIS — Z6827 Body mass index (BMI) 27.0-27.9, adult: Secondary | ICD-10-CM | POA: Diagnosis not present

## 2022-07-28 DIAGNOSIS — G500 Trigeminal neuralgia: Secondary | ICD-10-CM | POA: Diagnosis not present

## 2022-07-29 DIAGNOSIS — Z23 Encounter for immunization: Secondary | ICD-10-CM | POA: Diagnosis not present

## 2022-08-04 ENCOUNTER — Other Ambulatory Visit: Payer: Self-pay

## 2022-08-04 MED ORDER — NEUPRO 6 MG/24HR TD PT24: MEDICATED_PATCH | TRANSDERMAL | 1 refills | Status: DC

## 2022-08-10 DIAGNOSIS — M5451 Vertebrogenic low back pain: Secondary | ICD-10-CM | POA: Diagnosis not present

## 2022-08-11 DIAGNOSIS — H00015 Hordeolum externum left lower eyelid: Secondary | ICD-10-CM | POA: Diagnosis not present

## 2022-08-12 ENCOUNTER — Telehealth: Payer: Self-pay | Admitting: Internal Medicine

## 2022-08-12 NOTE — Telephone Encounter (Signed)
Received Preoperative Clearance Forms through fax from [place]. Printed out forms and placed in Dr. Parks Neptune box at the front.

## 2022-08-12 NOTE — Telephone Encounter (Signed)
Place form on MD desk to review.Marland KitchenJohny [name]

## 2022-08-16 ENCOUNTER — Other Ambulatory Visit: Payer: Self-pay | Admitting: Neurological Surgery

## 2022-08-17 DIAGNOSIS — M5451 Vertebrogenic low back pain: Secondary | ICD-10-CM | POA: Diagnosis not present

## 2022-08-17 NOTE — Telephone Encounter (Signed)
It was signed.  Thank you

## 2022-08-17 NOTE — Telephone Encounter (Signed)
Form was faxed back to [employer].Marland KitchenChryl Heck

## 2022-08-18 ENCOUNTER — Other Ambulatory Visit: Payer: Self-pay | Admitting: Neurological Surgery

## 2022-08-24 DIAGNOSIS — M5451 Vertebrogenic low back pain: Secondary | ICD-10-CM | POA: Diagnosis not present

## 2022-08-31 DIAGNOSIS — M5451 Vertebrogenic low back pain: Secondary | ICD-10-CM | POA: Diagnosis not present

## 2022-09-01 NOTE — Pre-Procedure Instructions (Signed)
Surgical Instructions    Your procedure is scheduled on Thursday, January 4th.  Report to [facility] Main Entrance "A" at 05:30 A.M., then check in with the Admitting office.  Call this number if you have problems the morning of surgery:  [phone]   If you have any questions prior to your surgery date call [phone]: Open Monday-Friday 8am-4pm    Remember:  Do not eat or drink after midnight the night before your surgery     Take these medicines the morning of surgery with A SIP OF WATER  baclofen (LIORESAL)  carbidopa-levodopa (SINEMET IR)  omeprazole (PRILOSEC)  rotigotine (NEUPRO)    As of today, STOP taking any Aspirin (unless otherwise instructed by your surgeon) Aleve, Naproxen, Ibuprofen, Motrin, Advil, Goody's, BC's, all herbal medications, fish oil, and all vitamins.                     Do NOT Smoke (Tobacco/Vaping) for 24 hours prior to your procedure.  If you use a CPAP at night, you may bring your mask/headgear for your overnight stay.   Contacts, glasses, piercing's, hearing aid's, dentures or partials may not be worn into surgery, please bring cases for these belongings.    For patients admitted to the hospital, discharge time will be determined by your treatment team.   Patients discharged the day of surgery will not be allowed to drive home, and someone needs to stay with them for 24 hours.  SURGICAL WAITING ROOM VISITATION Patients having surgery or a procedure may have no more than 2 support people in the waiting area - these visitors may rotate.   Children under the age of 31 must have an adult with them who is not the patient. If the patient needs to stay at the hospital during part of their recovery, the visitor guidelines for inpatient rooms apply. Pre-op nurse will coordinate an appropriate time for 1 support person to accompany patient in pre-op.  This support person may not rotate.   Please refer to the [facility] website for the visitor  guidelines for Inpatients (after your surgery is over and you are in a regular room).    Special instructions:   [place]- Preparing For Surgery  Before surgery, you can play an important role. Because skin is not sterile, your skin needs to be as free of germs as possible. You can reduce the number of germs on your skin by washing with CHG (chlorahexidine gluconate) Soap before surgery.  CHG is an antiseptic cleaner which kills germs and bonds with the skin to continue killing germs even after washing.    Oral Hygiene is also important to reduce your risk of infection.  Remember - BRUSH YOUR TEETH THE MORNING OF SURGERY WITH YOUR REGULAR TOOTHPASTE  Please do not use if you have an allergy to CHG or antibacterial soaps. If your skin becomes reddened/irritated stop using the CHG.  Do not shave (including legs and underarms) for at least 48 hours prior to first CHG shower. It is OK to shave your face.  Please follow these instructions carefully.   Shower the NIGHT BEFORE SURGERY and the MORNING OF SURGERY  If you chose to wash your hair, wash your hair first as usual with your normal shampoo.  After you shampoo, rinse your hair and body thoroughly to remove the shampoo.  Use CHG Soap as you would any other liquid soap. You can apply CHG directly to the skin and wash gently with a scrungie or  a clean washcloth.   Apply the CHG Soap to your body ONLY FROM THE NECK DOWN.  Do not use on open wounds or open sores. Avoid contact with your eyes, ears, mouth and genitals (private parts). Wash Face and genitals (private parts)  with your normal soap.   Wash thoroughly, paying special attention to the area where your surgery will be performed.  Thoroughly rinse your body with warm water from the neck down.  DO NOT shower/wash with your normal soap after using and rinsing off the CHG Soap.  Pat yourself dry with a CLEAN TOWEL.  Wear CLEAN PAJAMAS to bed the night before surgery  Place CLEAN  SHEETS on your bed the night before your surgery  DO NOT SLEEP WITH PETS.   Day of Surgery: Take a shower with CHG soap. Do not wear jewelry or makeup Do not wear lotions, powders, colognes, or deodorant.  Men may shave face and neck. Do not bring valuables to the hospital. [facility] is not responsible for any belongings or valuables.  Wear Clean/Comfortable clothing the morning of surgery Remember to brush your teeth WITH YOUR REGULAR TOOTHPASTE.   Please read over the following fact sheets that you were given.    If you received a COVID test during your pre-op visit  it is requested that you wear a mask when out in public, stay away from anyone that may not be feeling well and notify your surgeon if you develop symptoms. If you have been in contact with anyone that has tested positive in the last 10 days please notify you surgeon.

## 2022-09-02 ENCOUNTER — Other Ambulatory Visit: Payer: Self-pay

## 2022-09-02 ENCOUNTER — Encounter (HOSPITAL_COMMUNITY)
Admission: RE | Admit: 2022-09-02 | Discharge: 2022-09-02 | Disposition: A | Discharge status: Home / Self Care | Payer: Medicare HMO | Source: Ambulatory Visit | Attending: Neurological Surgery | Admitting: Neurological Surgery

## 2022-09-02 ENCOUNTER — Encounter (HOSPITAL_COMMUNITY): Payer: Self-pay

## 2022-09-02 VITALS — BP 137/74 | HR 63 | Temp 97.5°F | Resp 18 | Ht 67.0 in | Wt 177.2 lb

## 2022-09-02 DIAGNOSIS — Z01812 Encounter for preprocedural laboratory examination: Secondary | ICD-10-CM | POA: Insufficient documentation

## 2022-09-02 DIAGNOSIS — Z01818 Encounter for other preprocedural examination: Secondary | ICD-10-CM

## 2022-09-02 HISTORY — DX: Other specified postprocedural states: Z98.890

## 2022-09-02 LAB — COMPREHENSIVE METABOLIC PANEL
ALT: 15 U/L (ref 0–44)
AST: 22 U/L (ref 15–41)
Albumin: 3.7 g/dL (ref 3.5–5.0)
Alkaline Phosphatase: 39 U/L (ref 38–126)
Anion gap: 5 (ref 5–15)
BUN: 27 mg/dL — ABNORMAL HIGH (ref 8–23)
CO2: 24 mmol/L (ref 22–32)
Calcium: 9.2 mg/dL (ref 8.9–10.3)
Chloride: 108 mmol/L (ref 98–111)
Creatinine, Ser: 1.25 mg/dL — ABNORMAL HIGH (ref 0.61–1.24)
GFR, Estimated: >60 mL/min (ref >60)
Glucose, Bld: 112 mg/dL — ABNORMAL HIGH (ref 70–99)
Potassium: 4.1 mmol/L (ref 3.5–5.1)
Sodium: 137 mmol/L (ref 135–145)
Total Bilirubin: 0.9 mg/dL (ref 0.3–1.2)
Total Protein: 6.2 g/dL — ABNORMAL LOW (ref 6.5–8.1)

## 2022-09-02 LAB — CBC
HCT: 37.3 % — ABNORMAL LOW (ref 39.0–52.0)
Hemoglobin: 13.4 g/dL (ref 13.0–17.0)
MCH: 34.7 pg — ABNORMAL HIGH (ref 26.0–34.0)
MCHC: 35.9 g/dL (ref 30.0–36.0)
MCV: 96.6 fL (ref 80.0–100.0)
Platelets: 156 10*3/uL (ref 150–400)
RBC: 3.86 MIL/uL — ABNORMAL LOW (ref 4.22–5.81)
RDW: 13.1 % (ref 11.5–15.5)
WBC: 3.7 10*3/uL — ABNORMAL LOW (ref 4.0–10.5)
nRBC: 0 % (ref 0.0–0.2)

## 2022-09-02 LAB — TYPE AND SCREEN
ABO/RH(D): O NEG
Antibody Screen: NEGATIVE

## 2022-09-02 NOTE — Progress Notes (Signed)
PCP - Lew Dawes Cardiologist - denies Neurologist: Dr. Carles Collet  PPM/ICD - denies   Chest x-ray - n/a EKG - 06/18/22 Stress Test - denies ECHO - 05/28/17 Cardiac Cath - denies  Sleep Study - denies   Follow your surgeon's instructions on when to stop Aspirin.  If no instructions were given by your surgeon then you will need to call the office to get those instructions.     ERAS Protcol -no PRE-SURGERY Ensure or G2-   COVID TEST- not needed   Anesthesia review: parkinsons disease.   Patient denies shortness of breath, fever, cough and chest pain at PAT appointment   All instructions explained to the patient, with a verbal understanding of the material. Patient agrees to go over the instructions while at home for a better understanding. Patient also instructed to self quarantine after being tested for COVID-19. The opportunity to ask questions was provided.

## 2022-09-03 NOTE — Anesthesia Preprocedure Evaluation (Addendum)
Anesthesia Evaluation  Patient identified by MRN, date of birth, ID band Patient awake    Reviewed: Allergy & Precautions, NPO status , Patient's Chart, lab work & pertinent test results  History of Anesthesia Complications (+) history of anesthetic complications  Airway Mallampati: III  TM Distance: >3 FB Neck ROM: Full    Dental  (+) Teeth Intact, Dental Advisory Given   Pulmonary former smoker   breath sounds clear to auscultation       Cardiovascular negative cardio ROS  Rhythm:Regular  Left ventricle: The cavity size was normal. Systolic function was    normal. The estimated ejection fraction was in the range of 60%    to 65%. Wall motion was normal; there were no regional wall    motion abnormalities. Mild concentric and moderate focal basal    septal hypertrophy. Diastolic dysfunction, grade indeterminate.    Indeterminate filling pressures.  - Aortic valve: There was mild regurgitation.  - Aorta: Mild aortic root dilatation.  - Mitral valve: There was mild regurgitation.  - Left atrium: The atrium was moderately dilated.  - Atrial septum: No defect or patent foramen ovale was identified.     Neuro/Psych  Headaches Trigeminal neuralgia  Parkinsons  Neuromuscular disease  negative psych ROS   GI/Hepatic Neg liver ROS, hiatal hernia, PUD,GERD  ,,  Endo/Other  negative endocrine ROS    Renal/GU CRFRenal diseaseLab Results      Component                Value               Date                      CREATININE               1.25 (H)            09/02/2022                Musculoskeletal  (+) Arthritis ,    Abdominal   Peds  Hematology negative hematology ROS (+) Lab Results      Component                Value               Date                      WBC                      3.7 (L)             09/02/2022                HGB                      13.4                09/02/2022                HCT                       37.3 (L)            09/02/2022                MCV  96.6                09/02/2022                PLT                      156                 09/02/2022              Anesthesia Other Findings   Reproductive/Obstetrics                             Anesthesia Physical Anesthesia Plan  ASA: 3  Anesthesia Plan: General   Post-op Pain Management: Ofirmev IV (intra-op)*   Induction: Intravenous  PONV Risk Score and Plan: 2 and Ondansetron, Dexamethasone, Propofol infusion, TIVA and Treatment may vary due to age or medical condition  Airway Management Planned: Oral ETT  Additional Equipment: Arterial line  Intra-op Plan:   Post-operative Plan: Extubation in OR  Informed Consent: I have reviewed the patients History and Physical, chart, labs and discussed the procedure including the risks, benefits and alternatives for the proposed anesthesia with the patient or authorized representative who has indicated his/her understanding and acceptance.     Dental advisory given  Plan Discussed with: CRNA  Anesthesia Plan Comments: (PAT note written 09/03/2022 by Myra Gianotti, PA-C.  )       Anesthesia Quick Evaluation

## 2022-09-03 NOTE — Progress Notes (Signed)
Anesthesia Chart Review:  Case: [id number] Date/Time: 09/16/22 0715   Procedure: RETROSIGMOID CRANI FOR MICROVASCULAR DECOMPRESSION (Left)   Anesthesia type: General   Pre-op diagnosis: TRIGEMINAL NEURALGIA OF LEFT SIDE OF FACE   Location: [facility] OR ROOM 77 / [place] [facility]   Surgeons: Dawley, Theodoro Doing, DO       DISCUSSION: Patient is a 71 year old male scheduled for the above procedure.   History includes former smoker (quit 09/14/79), ulcerative colitis (total colectomy with ileoanal anastomosis 1992; sigmoidoscopy with anastomotic dilation 11/2014), Parkinson disease, HTN, BPH, Peyronie's disease, hiatal hernia, GERD, esophageal stricture (s/p dilation), IDA, trigeminal neuropathy (left side), sinus surgery (2004), spinal surgery (L4 hemilaminotomy/foraminotomy, resection L4-5 synovial cyst 06/28/22).    PCP Plotnikov, Evie Lacks, MD was signed a medical clearance note on 08/15/22, classifying patient as "Low Risk". Dr. Carles Collet had previously cleared him for his lumbar surgery on 06/28/22. She had ordered a MRI to re-evaluate trigeminal neuralgia with plans for referral back to neurosurgery. He is on rotigotine 6 mg/24 hr daily patch and Sinemet 25-100 mg 2 tablets at 7AM, 11AM, and 4PM for Parkinson's disease.    Anesthesia team to evaluate on the day of surgery.   VS: BP 137/74   Pulse 63   Temp (!) 36.4 C (Oral)   Resp 18   Ht 5' 7"  (1.702 m)   Wt 80.4 kg   SpO2 99%   BMI 27.75 kg/m    PROVIDERS: Plotnikov, Evie Lacks, MD is PCP  Tat, Wells Guiles, DO is neurologist.  Last visit 03/31/2022. Scarlette Shorts, MD is GI last visit 04/26/2022 with Carl Best, NP.  1 year follow-up recommended. Franchot Gallo, MD is urologist - He is not followed routinely by cardiology, but last seen by Shelva Majestic, MD on 09/21/17 for history of atypical chest pain, normal LVEF on 2018 echo. He was able to walk on a treadmill consistently without chest pain. As needed cardiology follow-up recommended.    LABS: Labs  reviewed: Acceptable for surgery. (all labs ordered are listed, but only abnormal results are displayed)  Labs Reviewed  COMPREHENSIVE METABOLIC PANEL - Abnormal; Notable for the following components:      Result Value   Glucose, Bld 112 (*)    BUN 27 (*)    Creatinine, Ser 1.25 (*)    Total Protein 6.2 (*)    All other components within normal limits  CBC - Abnormal; Notable for the following components:   WBC 3.7 (*)    RBC 3.86 (*)    HCT 37.3 (*)    MCH 34.7 (*)    All other components within normal limits  TYPE AND SCREEN    IMAGES: MRI Face 04/11/22: IMPRESSION: As shown on the study of 2018, there is an arterial branch of the left anterior inferior cerebellar artery passing adjacent to the root entry zone of the left fifth nerve which could be symptomatic.     EKG: 06/18/22: Ventricular rate 58 bpm Sinus bradycardia with 1st degree A-V block Left axis deviation Incomplete right bundle branch block Cannot rule out Anterior infarct , age undetermined Abnormal ECG When compared with ECG of 28-May-2017 05:30, No significant change since last tracing Confirmed by Glenetta Hew [phone]) on 06/19/2022 1:32:05 PM     CV: Echo 05/28/17: Study Conclusions  - Left ventricle: The cavity size was normal. Systolic function was    normal. The estimated ejection fraction was in the range of 60%    to 65%. Wall motion was normal; there were no  regional wall    motion abnormalities. Mild concentric and moderate focal basal    septal hypertrophy. Diastolic dysfunction, grade indeterminate.    Indeterminate filling pressures.  - Aortic valve: There was mild regurgitation.  - Aorta: Mild aortic root dilatation, 39 mm.  - Mitral valve: There was mild regurgitation.  - Left atrium: The atrium was moderately dilated.  - Atrial septum: No defect or patent foramen ovale was identified.     Past Medical History:  Diagnosis Date   Allergic rhinitis    Anemia, iron deficiency     Arthritis    BPH associated with nocturia    ED (erectile dysfunction)    GERD (gastroesophageal reflux disease)    Hiatal hernia    History of COVID-19 11/2020   per pt moderate symptoms that resovled   History of hypertension    followed by pcp  (per pt resolved no medication since approx 2017)   History of kidney stones    Left ureteral calculus    Parkinson disease    neurologist--- dr tat;   mild jaw dyskinesia,   gait disorder, tremors   Peyronie's disease    PONV (postoperative nausea and vomiting)    S/P dilatation of esophageal stricture    Trigeminal neuropathy    left side   Ulcerative colitis    followed by dr Henrene Pastor (GI)//   1992 s/p  total abdominal colectomy with ileoanal anastomosis   Wears glasses     Past Surgical History:  Procedure Laterality Date   CYSTOSCOPY W/ URETERAL STENT PLACEMENT  2012   for ureteral stricture   CYSTOSCOPY WITH RETROGRADE PYELOGRAM, URETEROSCOPY AND STENT PLACEMENT Left 12/31/2021   Procedure: CYSTOSCOPY WITH RETROGRADE PYELOGRAM, FLUOROSCOPIC INTERPRETATION, URETEROSCOPY  AND STENT PLACEMENT;  Surgeon: Franchot Gallo, MD;  Location: [facility];  Service: Urology;  Laterality: Left;   CYSTOSCOPY/URETEROSCOPY/HOLMIUM LASER/STENT PLACEMENT  10/22/2005   @[facility] ;   w/ left ureteral balloon dilatation   HOLMIUM LASER APPLICATION Left 27/25/3664   Procedure: HOLMIUM LASER APPLICATION;  Surgeon: Franchot Gallo, MD;  Location: [facility];  Service: Urology;  Laterality: Left;   INGUINAL HERNIA REPAIR     per pt unilateral  approxl 1962   KNEE ARTHROSCOPY Right 02/21/2002   @[facility]    LUMBAR LAMINECTOMY/DECOMPRESSION MICRODISCECTOMY N/A 06/28/2022   Procedure: L4-5 decompression with removal of synovial cyst;  Surgeon: Melina Schools, MD;  Location: [place];  Service: Orthopedics;  Laterality: N/A;  2.5 hrs 3 C-Bed   NASAL SINUS SURGERY     x2  in 2004   RECONSTRUCTION TENDON PULLEY HAND Right 05/03/2007    @[facility] ;  right long finger   TOTAL COLECTOMY  1992   W/  ILIEOANAL ANASTOMOSIS (for ilieoanal anatomatic stricture , partial bowel obstruction and required peroidic pneumatic dilatation's)   VARICOCELECTOMY      MEDICATIONS:  baclofen (LIORESAL) 10 MG tablet   carbidopa-levodopa (SINEMET IR) 25-100 MG tablet   ferrous sulfate 325 (65 FE) MG tablet   omeprazole (PRILOSEC) 40 MG capsule   ondansetron (ZOFRAN) 4 MG tablet   psyllium (METAMUCIL) 58.6 % powder   rotigotine (NEUPRO) 6 MG/24HR   No current facility-administered medications for this encounter.    Myra Gianotti, PA-C Surgical Short Stay/Anesthesiology [facility] Phone [phone] [facility] Phone [phone] 09/03/2022 6:50 PM

## 2022-09-15 DIAGNOSIS — M5451 Vertebrogenic low back pain: Secondary | ICD-10-CM | POA: Diagnosis not present

## 2022-09-16 ENCOUNTER — Inpatient Hospital Stay (HOSPITAL_COMMUNITY): Payer: Medicare HMO | Admitting: Vascular Surgery

## 2022-09-16 ENCOUNTER — Encounter (HOSPITAL_COMMUNITY)
Admission: RE | Disposition: A | Discharge status: Home / Self Care | Payer: Self-pay | Source: Home / Self Care | Attending: Neurological Surgery

## 2022-09-16 ENCOUNTER — Encounter (HOSPITAL_COMMUNITY): Payer: Self-pay | Admitting: Neurological Surgery

## 2022-09-16 ENCOUNTER — Inpatient Hospital Stay (HOSPITAL_COMMUNITY)
Admission: RE | Admit: 2022-09-16 | Discharge: 2022-09-17 | DRG: 027 | Disposition: A | Discharge status: Home / Self Care | Payer: Medicare HMO | Attending: Neurological Surgery | Admitting: Neurological Surgery

## 2022-09-16 ENCOUNTER — Inpatient Hospital Stay (HOSPITAL_COMMUNITY): Payer: Medicare HMO | Admitting: Certified Registered"

## 2022-09-16 ENCOUNTER — Other Ambulatory Visit: Payer: Self-pay

## 2022-09-16 DIAGNOSIS — K449 Diaphragmatic hernia without obstruction or gangrene: Secondary | ICD-10-CM | POA: Diagnosis not present

## 2022-09-16 DIAGNOSIS — N401 Enlarged prostate with lower urinary tract symptoms: Secondary | ICD-10-CM | POA: Diagnosis present

## 2022-09-16 DIAGNOSIS — G20A1 Parkinson's disease without dyskinesia, without mention of fluctuations: Secondary | ICD-10-CM | POA: Diagnosis present

## 2022-09-16 DIAGNOSIS — Z87891 Personal history of nicotine dependence: Secondary | ICD-10-CM

## 2022-09-16 DIAGNOSIS — Z79899 Other long term (current) drug therapy: Secondary | ICD-10-CM | POA: Diagnosis not present

## 2022-09-16 DIAGNOSIS — K219 Gastro-esophageal reflux disease without esophagitis: Secondary | ICD-10-CM | POA: Diagnosis not present

## 2022-09-16 DIAGNOSIS — G500 Trigeminal neuralgia: Secondary | ICD-10-CM

## 2022-09-16 DIAGNOSIS — N189 Chronic kidney disease, unspecified: Secondary | ICD-10-CM

## 2022-09-16 DIAGNOSIS — Z8249 Family history of ischemic heart disease and other diseases of the circulatory system: Secondary | ICD-10-CM | POA: Diagnosis not present

## 2022-09-16 DIAGNOSIS — Z8616 Personal history of COVID-19: Secondary | ICD-10-CM

## 2022-09-16 DIAGNOSIS — I080 Rheumatic disorders of both mitral and aortic valves: Secondary | ICD-10-CM

## 2022-09-16 HISTORY — PX: CRANIECTOMY: SHX331

## 2022-09-16 LAB — POCT I-STAT 7, (LYTES, BLD GAS, ICA,H+H)
Acid-base deficit: 5 mmol/L — ABNORMAL HIGH (ref 0.0–2.0)
Bicarbonate: 19.4 mmol/L — ABNORMAL LOW (ref 20.0–28.0)
Calcium, Ion: 1.27 mmol/L (ref 1.15–1.40)
HCT: 32 % — ABNORMAL LOW (ref 39.0–52.0)
Hemoglobin: 10.9 g/dL — ABNORMAL LOW (ref 13.0–17.0)
O2 Saturation: 100 %
Potassium: 4 mmol/L (ref 3.5–5.1)
Sodium: 143 mmol/L (ref 135–145)
TCO2: 20 mmol/L — ABNORMAL LOW (ref 22–32)
pCO2 arterial: 32.7 mmHg (ref 32–48)
pH, Arterial: 7.382 (ref 7.35–7.45)
pO2, Arterial: 238 mmHg — ABNORMAL HIGH (ref 83–108)

## 2022-09-16 LAB — ABO/RH: ABO/RH(D): O NEG

## 2022-09-16 LAB — MRSA NEXT GEN BY PCR, NASAL: MRSA by PCR Next Gen: NOT DETECTED

## 2022-09-16 SURGERY — CRANIECTOMY FOR TIC DOULOUREUX
Anesthesia: General | Laterality: Left

## 2022-09-16 MED ORDER — CLEVIDIPINE BUTYRATE 0.5 MG/ML IV EMUL
INTRAVENOUS | Status: DC | PRN
  Administered 2022-09-16: 2 mg/h via INTRAVENOUS

## 2022-09-16 MED ORDER — LABETALOL HCL 5 MG/ML IV SOLN
5.0000 mg | Freq: Once | INTRAVENOUS | Status: AC
  Administered 2022-09-16: 5 mg via INTRAVENOUS

## 2022-09-16 MED ORDER — CHLORHEXIDINE GLUCONATE CLOTH 2 % EX PADS
6.0000 | MEDICATED_PAD | Freq: Every day | CUTANEOUS | Status: DC
  Administered 2022-09-16 – 2022-09-17 (×2): 6 via TOPICAL

## 2022-09-16 MED ORDER — LIDOCAINE-EPINEPHRINE 1 %-1:100000 IJ SOLN
INTRAMUSCULAR | Status: DC | PRN
  Administered 2022-09-16: 3.5 mL

## 2022-09-16 MED ORDER — SODIUM CHLORIDE 0.9 % IV SOLN: INTRAVENOUS | Status: DC

## 2022-09-16 MED ORDER — ACETAMINOPHEN 650 MG RE SUPP: 650.0000 mg | RECTAL | Status: DC | PRN

## 2022-09-16 MED ORDER — GLYCOPYRROLATE PF 0.2 MG/ML IJ SOSY
PREFILLED_SYRINGE | INTRAMUSCULAR | Status: AC
  Filled 2022-09-16: qty 1

## 2022-09-16 MED ORDER — SODIUM CHLORIDE 0.9 % IV SOLN
0.1500 ug/kg/min | Freq: Once | INTRAVENOUS | Status: DC
  Filled 2022-09-16: qty 2000

## 2022-09-16 MED ORDER — ACETAMINOPHEN 160 MG/5ML PO SOLN: 1000.0000 mg | Freq: Once | ORAL | Status: DC | PRN

## 2022-09-16 MED ORDER — LIDOCAINE-EPINEPHRINE 1 %-1:100000 IJ SOLN
INTRAMUSCULAR | Status: AC
  Filled 2022-09-16: qty 1

## 2022-09-16 MED ORDER — PHENYLEPHRINE HCL-NACL 20-0.9 MG/250ML-% IV SOLN
INTRAVENOUS | Status: DC | PRN
  Administered 2022-09-16: 50 ug/min via INTRAVENOUS

## 2022-09-16 MED ORDER — DOCUSATE SODIUM 100 MG PO CAPS
100.0000 mg | ORAL_CAPSULE | Freq: Two times a day (BID) | ORAL | Status: DC
  Administered 2022-09-16: 100 mg via ORAL
  Filled 2022-09-16: qty 1

## 2022-09-16 MED ORDER — CHLORHEXIDINE GLUCONATE 0.12 % MT SOLN
OROMUCOSAL | Status: AC
  Administered 2022-09-16: 15 mL
  Filled 2022-09-16: qty 15

## 2022-09-16 MED ORDER — BUPIVACAINE HCL (PF) 0.5 % IJ SOLN
INTRAMUSCULAR | Status: AC
  Filled 2022-09-16: qty 30

## 2022-09-16 MED ORDER — OXYCODONE HCL 5 MG PO TABS: 5.0000 mg | ORAL_TABLET | Freq: Once | ORAL | Status: DC | PRN

## 2022-09-16 MED ORDER — MICROFIBRILLAR COLL HEMOSTAT EX PADS
MEDICATED_PAD | CUTANEOUS | Status: DC | PRN
  Administered 2022-09-16: 1 via TOPICAL

## 2022-09-16 MED ORDER — HEMOSTATIC AGENTS (NO CHARGE) OPTIME
TOPICAL | Status: DC | PRN
  Administered 2022-09-16: 1 via TOPICAL

## 2022-09-16 MED ORDER — CHLORHEXIDINE GLUCONATE CLOTH 2 % EX PADS: 6.0000 | MEDICATED_PAD | Freq: Once | CUTANEOUS | Status: DC

## 2022-09-16 MED ORDER — LACTATED RINGERS IV SOLN: INTRAVENOUS | Status: DC | PRN

## 2022-09-16 MED ORDER — BUPIVACAINE HCL (PF) 0.5 % IJ SOLN
INTRAMUSCULAR | Status: DC | PRN
  Administered 2022-09-16: 3.5 mL

## 2022-09-16 MED ORDER — ROCURONIUM BROMIDE 10 MG/ML (PF) SYRINGE
PREFILLED_SYRINGE | INTRAVENOUS | Status: AC
  Filled 2022-09-16: qty 10

## 2022-09-16 MED ORDER — LABETALOL HCL 5 MG/ML IV SOLN
10.0000 mg | INTRAVENOUS | Status: DC | PRN
  Administered 2022-09-16: 10 mg via INTRAVENOUS
  Administered 2022-09-16 – 2022-09-17 (×4): 20 mg via INTRAVENOUS
  Filled 2022-09-16 (×3): qty 4

## 2022-09-16 MED ORDER — THROMBIN 5000 UNITS EX SOLR: OROMUCOSAL | Status: DC | PRN

## 2022-09-16 MED ORDER — FENTANYL CITRATE (PF) 250 MCG/5ML IJ SOLN
INTRAMUSCULAR | Status: AC
  Filled 2022-09-16: qty 5

## 2022-09-16 MED ORDER — PANTOPRAZOLE SODIUM 40 MG PO TBEC
80.0000 mg | DELAYED_RELEASE_TABLET | Freq: Every day | ORAL | Status: DC
  Administered 2022-09-17: 80 mg via ORAL
  Filled 2022-09-16: qty 2

## 2022-09-16 MED ORDER — FENTANYL CITRATE (PF) 100 MCG/2ML IJ SOLN: 25.0000 ug | INTRAMUSCULAR | Status: DC | PRN

## 2022-09-16 MED ORDER — BACITRACIN ZINC 500 UNIT/GM EX OINT
TOPICAL_OINTMENT | CUTANEOUS | Status: DC | PRN
  Administered 2022-09-16: 1 via TOPICAL

## 2022-09-16 MED ORDER — CEFAZOLIN SODIUM-DEXTROSE 2-4 GM/100ML-% IV SOLN: 2.0000 g | INTRAVENOUS | Status: AC

## 2022-09-16 MED ORDER — PROPOFOL 10 MG/ML IV BOLUS
INTRAVENOUS | Status: DC | PRN
  Administered 2022-09-16: 30 mg via INTRAVENOUS
  Administered 2022-09-16: 60 mg via INTRAVENOUS
  Administered 2022-09-16: 30 mg via INTRAVENOUS
  Administered 2022-09-16: 100 mg via INTRAVENOUS

## 2022-09-16 MED ORDER — THROMBIN 5000 UNITS EX SOLR
CUTANEOUS | Status: AC
  Filled 2022-09-16: qty 5000

## 2022-09-16 MED ORDER — OXYCODONE HCL 5 MG/5ML PO SOLN: 5.0000 mg | Freq: Once | ORAL | Status: DC | PRN

## 2022-09-16 MED ORDER — PROMETHAZINE HCL 25 MG PO TABS: 12.5000 mg | ORAL_TABLET | ORAL | Status: DC | PRN

## 2022-09-16 MED ORDER — ONDANSETRON HCL 4 MG/2ML IJ SOLN
INTRAMUSCULAR | Status: DC | PRN
  Administered 2022-09-16: 4 mg via INTRAVENOUS

## 2022-09-16 MED ORDER — CEFAZOLIN SODIUM-DEXTROSE 1-4 GM/50ML-% IV SOLN
1.0000 g | Freq: Three times a day (TID) | INTRAVENOUS | Status: AC
  Administered 2022-09-16 (×2): 1 g via INTRAVENOUS
  Filled 2022-09-16 (×2): qty 50

## 2022-09-16 MED ORDER — ROCURONIUM BROMIDE 10 MG/ML (PF) SYRINGE
PREFILLED_SYRINGE | INTRAVENOUS | Status: DC | PRN
  Administered 2022-09-16: 70 mg via INTRAVENOUS
  Administered 2022-09-16: 100 mg via INTRAVENOUS
  Administered 2022-09-16: 30 mg via INTRAVENOUS
  Administered 2022-09-16: 100 mg via INTRAVENOUS

## 2022-09-16 MED ORDER — ONDANSETRON HCL 4 MG PO TABS: 4.0000 mg | ORAL_TABLET | ORAL | Status: DC | PRN

## 2022-09-16 MED ORDER — SODIUM CHLORIDE 0.9 % IV SOLN
0.1500 ug/kg/min | Freq: Once | INTRAVENOUS | Status: AC
  Administered 2022-09-16: .2 ug/kg/min via INTRAVENOUS
  Administered 2022-09-16: .25 ug/kg/min via INTRAVENOUS
  Filled 2022-09-16: qty 2000

## 2022-09-16 MED ORDER — ACETAMINOPHEN 325 MG PO TABS: 650.0000 mg | ORAL_TABLET | ORAL | Status: DC | PRN

## 2022-09-16 MED ORDER — ORAL CARE MOUTH RINSE: 15.0000 mL | Freq: Once | OROMUCOSAL | Status: DC

## 2022-09-16 MED ORDER — CHLORHEXIDINE GLUCONATE 0.12 % MT SOLN: 15.0000 mL | Freq: Once | OROMUCOSAL | Status: DC

## 2022-09-16 MED ORDER — VANCOMYCIN HCL 1000 MG IV SOLR
INTRAVENOUS | Status: DC | PRN
  Administered 2022-09-16: 1000 mg via INTRAVENOUS

## 2022-09-16 MED ORDER — DEXAMETHASONE SODIUM PHOSPHATE 10 MG/ML IJ SOLN
INTRAMUSCULAR | Status: DC | PRN
  Administered 2022-09-16: 10 mg via INTRAVENOUS

## 2022-09-16 MED ORDER — ALBUMIN HUMAN 5 % IV SOLN: INTRAVENOUS | Status: DC | PRN

## 2022-09-16 MED ORDER — SUGAMMADEX SODIUM 200 MG/2ML IV SOLN
INTRAVENOUS | Status: DC | PRN
  Administered 2022-09-16: 200 mg via INTRAVENOUS

## 2022-09-16 MED ORDER — BACLOFEN 10 MG PO TABS
10.0000 mg | ORAL_TABLET | Freq: Three times a day (TID) | ORAL | Status: DC
  Administered 2022-09-16 – 2022-09-17 (×4): 10 mg via ORAL
  Filled 2022-09-16 (×5): qty 1

## 2022-09-16 MED ORDER — LIDOCAINE 2% (20 MG/ML) 5 ML SYRINGE
INTRAMUSCULAR | Status: AC
  Filled 2022-09-16: qty 5

## 2022-09-16 MED ORDER — CEFAZOLIN SODIUM-DEXTROSE 2-4 GM/100ML-% IV SOLN
INTRAVENOUS | Status: AC
  Filled 2022-09-16: qty 100

## 2022-09-16 MED ORDER — BACITRACIN ZINC 500 UNIT/GM EX OINT
TOPICAL_OINTMENT | CUTANEOUS | Status: AC
  Filled 2022-09-16: qty 28.35

## 2022-09-16 MED ORDER — 0.9 % SODIUM CHLORIDE (POUR BTL) OPTIME
TOPICAL | Status: DC | PRN
  Administered 2022-09-16: 2000 mL

## 2022-09-16 MED ORDER — LABETALOL HCL 5 MG/ML IV SOLN
INTRAVENOUS | Status: AC
  Filled 2022-09-16: qty 4

## 2022-09-16 MED ORDER — LIDOCAINE 2% (20 MG/ML) 5 ML SYRINGE
INTRAMUSCULAR | Status: DC | PRN
  Administered 2022-09-16: 60 mg via INTRAVENOUS

## 2022-09-16 MED ORDER — THROMBIN 20000 UNITS EX SOLR
CUTANEOUS | Status: AC
  Filled 2022-09-16: qty 20000

## 2022-09-16 MED ORDER — ACETAMINOPHEN 500 MG PO TABS: 1000.0000 mg | ORAL_TABLET | Freq: Once | ORAL | Status: DC | PRN

## 2022-09-16 MED ORDER — PROPOFOL 10 MG/ML IV BOLUS
INTRAVENOUS | Status: AC
  Filled 2022-09-16: qty 20

## 2022-09-16 MED ORDER — PROPOFOL 500 MG/50ML IV EMUL
INTRAVENOUS | Status: DC | PRN
  Administered 2022-09-16: 100 ug/kg/min via INTRAVENOUS

## 2022-09-16 MED ORDER — HYDRALAZINE HCL 20 MG/ML IJ SOLN
INTRAMUSCULAR | Status: AC
  Filled 2022-09-16: qty 1

## 2022-09-16 MED ORDER — LABETALOL HCL 5 MG/ML IV SOLN
15.0000 mg | Freq: Once | INTRAVENOUS | Status: AC
  Administered 2022-09-16: 15 mg via INTRAVENOUS

## 2022-09-16 MED ORDER — HYDRALAZINE HCL 20 MG/ML IJ SOLN
20.0000 mg | Freq: Once | INTRAMUSCULAR | Status: AC
  Administered 2022-09-16: 20 mg via INTRAVENOUS

## 2022-09-16 MED ORDER — ACETAMINOPHEN 10 MG/ML IV SOLN: 1000.0000 mg | Freq: Once | INTRAVENOUS | Status: DC | PRN

## 2022-09-16 MED ORDER — EPHEDRINE SULFATE-NACL 50-0.9 MG/10ML-% IV SOSY
PREFILLED_SYRINGE | INTRAVENOUS | Status: DC | PRN
  Administered 2022-09-16: 5 mg via INTRAVENOUS
  Administered 2022-09-16: 10 mg via INTRAVENOUS

## 2022-09-16 MED ORDER — THROMBIN 20000 UNITS EX SOLR: CUTANEOUS | Status: DC | PRN

## 2022-09-16 MED ORDER — MORPHINE SULFATE (PF) 2 MG/ML IV SOLN
1.0000 mg | INTRAVENOUS | Status: DC | PRN
  Administered 2022-09-17 (×2): 2 mg via INTRAVENOUS
  Filled 2022-09-16 (×2): qty 1

## 2022-09-16 MED ORDER — HYDROCODONE-ACETAMINOPHEN 5-325 MG PO TABS
1.0000 | ORAL_TABLET | ORAL | Status: DC | PRN
  Administered 2022-09-16: 1 via ORAL
  Filled 2022-09-16: qty 1

## 2022-09-16 MED ORDER — GLYCOPYRROLATE 0.2 MG/ML IJ SOLN
INTRAMUSCULAR | Status: DC | PRN
  Administered 2022-09-16: .2 mg via INTRAVENOUS

## 2022-09-16 MED ORDER — CARBIDOPA-LEVODOPA 25-100 MG PO TABS
2.0000 | ORAL_TABLET | Freq: Three times a day (TID) | ORAL | Status: DC
  Administered 2022-09-16 – 2022-09-17 (×4): 2 via ORAL
  Filled 2022-09-16 (×5): qty 2

## 2022-09-16 MED ORDER — ONDANSETRON HCL 4 MG/2ML IJ SOLN: 4.0000 mg | INTRAMUSCULAR | Status: DC | PRN

## 2022-09-16 MED ORDER — PROPOFOL 1000 MG/100ML IV EMUL
INTRAVENOUS | Status: AC
  Filled 2022-09-16: qty 200

## 2022-09-16 SURGICAL SUPPLY — 101 items
BAG COUNTER SPONGE SURGICOUNT (BAG) ×1 IMPLANT
BAND RUBBER #18 3X1/16 STRL (MISCELLANEOUS) IMPLANT
BENZOIN TINCTURE PRP APPL 2/3 (GAUZE/BANDAGES/DRESSINGS) IMPLANT
BLADE CLIPPER SURG (BLADE) ×1 IMPLANT
BLADE EYE SICKLE 84 5 BEAV (BLADE) IMPLANT
BLADE SAW GIGLI 16 STRL (MISCELLANEOUS) IMPLANT
BLADE SURG 15 STRL LF DISP TIS (BLADE) IMPLANT
BLADE SURG 15 STRL SS (BLADE)
BLADE ULTRA TIP 2M (BLADE) ×1 IMPLANT
BNDG GAUZE DERMACEA FLUFF 4 (GAUZE/BANDAGES/DRESSINGS) IMPLANT
BNDG STRETCH 4X75 STRL LF (GAUZE/BANDAGES/DRESSINGS) IMPLANT
BUR ACORN 6.0 PRECISION (BURR) ×1 IMPLANT
BUR CARBIDE MATCH 3.0 (BURR) IMPLANT
BUR ROUND FLUTED 4 SOFT TCH (BURR) IMPLANT
BUR SPIRAL ROUTER 2.3 (BUR) IMPLANT
CANISTER SUCT 3000ML PPV (MISCELLANEOUS) ×2 IMPLANT
CATH VENTRIC 35X38 W/TROCAR LG (CATHETERS) IMPLANT
CLIP VESOCCLUDE MED 6/CT (CLIP) IMPLANT
CNTNR URN SCR LID CUP LEK RST (MISCELLANEOUS) ×1 IMPLANT
CONT SPEC 4OZ STRL OR WHT (MISCELLANEOUS) ×1
COVER MAYO STAND STRL (DRAPES) IMPLANT
DRAIN SUBARACHNOID (WOUND CARE) IMPLANT
DRAPE HALF SHEET 40X57 (DRAPES) ×1 IMPLANT
DRAPE MICROSCOPE SLANT 54X150 (MISCELLANEOUS) IMPLANT
DRAPE NEUROLOGICAL W/INCISE (DRAPES) ×1 IMPLANT
DRAPE STERI IOBAN 125X83 (DRAPES) IMPLANT
DRAPE SURG 17X23 STRL (DRAPES) IMPLANT
DRAPE WARM FLUID 44X44 (DRAPES) ×1 IMPLANT
DRSG ADAPTIC 3X8 NADH LF (GAUZE/BANDAGES/DRESSINGS) IMPLANT
DRSG OPSITE POSTOP 4X6 (GAUZE/BANDAGES/DRESSINGS) IMPLANT
DRSG TELFA 3X8 NADH STRL (GAUZE/BANDAGES/DRESSINGS) IMPLANT
DURAPREP 6ML APPLICATOR 50/CS (WOUND CARE) ×1 IMPLANT
ELECT COATED BLADE 2.86 ST (ELECTRODE) IMPLANT
ELECT REM PT RETURN 9FT ADLT (ELECTROSURGICAL) ×1
ELECTRODE REM PT RTRN 9FT ADLT (ELECTROSURGICAL) ×1 IMPLANT
EVACUATOR 1/8 PVC DRAIN (DRAIN) IMPLANT
EVACUATOR SILICONE 100CC (DRAIN) IMPLANT
FELT TEFLON 1X6 (MISCELLANEOUS) IMPLANT
FELT TEFLON 6X6 (MISCELLANEOUS) ×1 IMPLANT
FORCEPS BIPOLAR SPETZLER 8 1.0 (NEUROSURGERY SUPPLIES) ×1 IMPLANT
GAUZE 4X4 16PLY ~~LOC~~+RFID DBL (SPONGE) IMPLANT
GAUZE SPONGE 4X4 12PLY STRL (GAUZE/BANDAGES/DRESSINGS) ×1 IMPLANT
GLOVE BIOGEL PI IND STRL 8 (GLOVE) ×1 IMPLANT
GLOVE ECLIPSE 8.0 STRL XLNG CF (GLOVE) ×2 IMPLANT
GLOVE EXAM NITRILE XL STR (GLOVE) IMPLANT
GOWN STRL REUS W/ TWL LRG LVL3 (GOWN DISPOSABLE) ×1 IMPLANT
GOWN STRL REUS W/ TWL XL LVL3 (GOWN DISPOSABLE) ×1 IMPLANT
GOWN STRL REUS W/TWL 2XL LVL3 (GOWN DISPOSABLE) IMPLANT
GOWN STRL REUS W/TWL LRG LVL3 (GOWN DISPOSABLE) ×1
GOWN STRL REUS W/TWL XL LVL3 (GOWN DISPOSABLE) ×1
GRAFT DURAGEN MATRIX 1WX1L (Tissue) IMPLANT
HEMOSTAT POWDER KIT SURGIFOAM (HEMOSTASIS) ×1 IMPLANT
HEMOSTAT SURGICEL 2X14 (HEMOSTASIS) IMPLANT
HEMOSTAT SURGICEL 2X4 FIBR (HEMOSTASIS) IMPLANT
IV NS 1000ML (IV SOLUTION) ×1
IV NS 1000ML BAXH (IV SOLUTION) ×1 IMPLANT
KIT BASIN OR (CUSTOM PROCEDURE TRAY) ×1 IMPLANT
KIT DRAIN CSF ACCUDRAIN (MISCELLANEOUS) IMPLANT
KIT TURNOVER KIT B (KITS) ×1 IMPLANT
KNIFE ARACHNOID DISP AM-24-S (MISCELLANEOUS) ×1 IMPLANT
KNIFE ARACHNOID DISP AM-24-SB (BLADE) IMPLANT
NDL HYPO 25X1 1.5 SAFETY (NEEDLE) ×1 IMPLANT
NDL SPNL 18GX3.5 QUINCKE PK (NEEDLE) IMPLANT
NEEDLE HYPO 25X1 1.5 SAFETY (NEEDLE) ×1 IMPLANT
NEEDLE SPNL 18GX3.5 QUINCKE PK (NEEDLE) IMPLANT
NS IRRIG 1000ML POUR BTL (IV SOLUTION) ×3 IMPLANT
PACK BATTERY CMF DISP FOR DVR (ORTHOPEDIC DISPOSABLE SUPPLIES) IMPLANT
PACK CRANIOTOMY CUSTOM (CUSTOM PROCEDURE TRAY) ×1 IMPLANT
PATTIES SURGICAL .25X.25 (GAUZE/BANDAGES/DRESSINGS) IMPLANT
PATTIES SURGICAL .5 X.5 (GAUZE/BANDAGES/DRESSINGS) IMPLANT
PATTIES SURGICAL .5 X3 (DISPOSABLE) IMPLANT
PATTIES SURGICAL 1/4 X 3 (GAUZE/BANDAGES/DRESSINGS) IMPLANT
PATTIES SURGICAL 1X1 (DISPOSABLE) IMPLANT
PIN MAYFIELD SKULL DISP (PIN) ×1 IMPLANT
PLATE MALL UNIV 0.3 (Plate) IMPLANT
SCREW UNIII AXS SD 1.5X4 (Screw) IMPLANT
SEALANT ADHERUS EXTEND TIP (MISCELLANEOUS) IMPLANT
SPECIMEN JAR SMALL (MISCELLANEOUS) IMPLANT
SPIKE FLUID TRANSFER (MISCELLANEOUS) ×1 IMPLANT
SPONGE NEURO XRAY DETECT 1X3 (DISPOSABLE) IMPLANT
SPONGE SURGIFOAM ABS GEL 100 (HEMOSTASIS) ×1 IMPLANT
STAPLER VISISTAT 35W (STAPLE) ×1 IMPLANT
STOCKINETTE 6  STRL (DRAPES)
STOCKINETTE 6 STRL (DRAPES) IMPLANT
SUT BONE WAX W31G (SUTURE) IMPLANT
SUT ETHILON 3 0 FSL (SUTURE) IMPLANT
SUT ETHILON 3 0 PS 1 (SUTURE) IMPLANT
SUT NURALON 4 0 TR CR/8 (SUTURE) ×3 IMPLANT
SUT SILK 0 TIES 10X30 (SUTURE) IMPLANT
SUT VIC AB 0 CT1 18XCR BRD8 (SUTURE) ×2 IMPLANT
SUT VIC AB 0 CT1 8-18 (SUTURE) ×2
SUT VIC AB 2-0 CP2 18 (SUTURE) IMPLANT
SUT VIC AB 3-0 SH 8-18 (SUTURE) ×2 IMPLANT
TIP NONSTICK .5MMX23CM (INSTRUMENTS) ×1
TIP NONSTICK .5X23 (INSTRUMENTS) IMPLANT
TOWEL GREEN STERILE (TOWEL DISPOSABLE) ×1 IMPLANT
TOWEL GREEN STERILE FF (TOWEL DISPOSABLE) ×1 IMPLANT
TRAY FOLEY MTR SLVR 16FR STAT (SET/KITS/TRAYS/PACK) ×1 IMPLANT
TUBE CONNECTING 12X1/4 (SUCTIONS) ×1 IMPLANT
UNDERPAD 30X36 HEAVY ABSORB (UNDERPADS AND DIAPERS) ×1 IMPLANT
WATER STERILE IRR 1000ML POUR (IV SOLUTION) ×1 IMPLANT

## 2022-09-16 NOTE — Transfer of Care (Signed)
Immediate Anesthesia Transfer of Care Note  Patient: William [name]  Procedure(s) Performed: RETROSIGMOID CRANI FOR MICROVASCULAR DECOMPRESSION (Left)  Patient Location: PACU  Anesthesia Type:General  Level of Consciousness: drowsy and patient cooperative  Airway & Oxygen Therapy: Patient Spontanous Breathing  Post-op Assessment: Report given to RN and Post -op Vital signs reviewed and stable  Post vital signs: Reviewed and stable  Last Vitals:  Vitals Value Taken Time  BP 103/65 09/16/22 1315  Temp    Pulse 76 09/16/22 1319  Resp 14 09/16/22 1319  SpO2 90 % 09/16/22 1319  Vitals shown include unvalidated device data.  Last Pain:  Vitals:   09/16/22 0649  TempSrc:   PainSc: 0-No pain         Complications: No notable events documented.

## 2022-09-16 NOTE — Op Note (Signed)
Providing Compassionate, Quality Care - Together  Date of service: 09/16/2022  PREOP DIAGNOSIS:  Left trigeminal neuralgia, primarily V2  POSTOP DIAGNOSIS: Same  PROCEDURE: Left retrosigmoid craniotomy for microvascular decompression of the trigeminal nerve Intraoperative use of microscope for microdissection  SURGEON: Dr. Pieter Partridge C. Rickia Freeburg, DO  ASSISTANT: Dr. Ashok Pall, MD  ANESTHESIA: General Endotracheal  EBL: 500 cc  SPECIMENS: None  DRAINS: None  COMPLICATIONS: None  CONDITION: Hemodynamically stable  HISTORY: William [name] is a 72 y.o. male with a many year history of left-sided primarily V2 trigeminal neuralgia.  He failed multiple medications and had continued intractable episodes of pain multiple times per day that was altering his lifestyle.  MRI revealed likely an Aica Loop compressing the cisternal component of the trigeminal nerve on the left.  Given this we discussed treatments of trigeminal neuralgia including balloon rhizotomy, radiosurgery and microvascular decompression.  I recommended microvascular decompression given his symptoms and we discussed all risks and benefits expected outcomes.  Answered all of his questions.  Informed consent was obtained and witnessed.  PROCEDURE IN DETAIL: The patient was brought to the operating room. After induction of general anesthesia, the patient's head was affixed to the [place] head holder, the patient was positioned on the operative table in the lateral position with the left side up. All pressure points were meticulously padded.  The head was turned slightly downward, and flex slightly.  The left posterior auricular suboccipital region was clipped free of hair.  Anatomical landmarks were marked out, mastoid tip, groove and the trajectory of the zygomatic arch and inion for transverse sinus.  Skin incision was then marked out and prepped and draped in the usual sterile fashion. Physician driven timeout was performed.   Local anesthetic was injected into the wound.   10 blade, sharp dissection was performed down to the fascia.  This was then opened sharply with a 10 blade, then using Bovie electrocautery, muscle was dissected down to the paracranium.  This was reflected anteriorly and posteriorly to expose the mastoid tip and mastoid groove in the lateral suboccipital region.  Deep retractors placed in the wound.  Using high-speed drill, left retrosigmoid craniectomy was performed to identify the transverse sigmoid sinuses and junction.  Epidural hemostasis was achieved with Surgifoam.  The craniectomy was carried slightly further with Kerrison rongeurs along the transverse and sigmoid sinuses.  Mastoid air cells were waxed copiously.  The microscope was then sterilely draped and brought into the field for the remainder of the procedure.  Using a 15 blade, a curvilinear durotomy was performed along the transverse sigmoid junction and followed slightly inferiorly.  This was further opened with Metzenbaum scissors.  4-0 Nurolon suture were placed medially for retraction.  A copious amount of CSF was then egressed over period of minutes to allow cerebellar relaxation.   Using microdissection, arachnoid dissection was performed superior laterally to identify the tentorial petrous junction.  I then opened the lateral arachnoid identified the seventh/8th nerve complex and moved superiorly to identify the 5th nerve.  The arachnoid was carried further open with microdissection and an arachnoid knife.  1 branch of the superior petrosal vein was overlying the visualization of the superior portion of the trigeminal nerve this was then coagulated and cut sharply.  I could then identify 1 arterial loop tracking dorsally and wedging between the trigeminal nerve root exit zone along proximal cisternal portion of the trigeminal nerve.  Careful microdissection allowed freeing up of the vascular loop, was then able  to be reduced inferiorly.  The  inferior and ventral portion of the nerve was then again explored and noted to have no further vascular compression.  I then placed multiple small Teflon pledgets along the shoulder of the nerve root to maintain the elevation of the vessel loop.  Again the nerve was followed distally and there was noted to be no further vascular compression.  The cavity was then copiously irrigated and noted to be excellently hemostatic.   I then removed the 4-0 Nurolon retention sutures, closed the dura with 4-0 Nurolon's in a simple interrupted fashion.  A small defect was then filled with a small amount of muscle inferiorly.  Epidural hemostasis was again achieved with Gelfoam and cottonoids.  The mastoid air cells were then waxed again and noted to be appropriately waxed.  The dura appeared appropriately approximated, a small piece of DuraGen was overlying and adherus glue was used.  I then placed a small mesh cranioplasty over the craniectomy site, and affixed it with 4 mm screws.  Epidural hemostasis was noted to be appropriate.  Retractor was taken out of the wound.  Soft tissue hemostasis was achieved with bipolar cautery.  Wound was then closed in layers, 0 Vicryl for muscle and fascia.  Dermis was closed with 2-0 Vicryl.  The skin was then closed with 3-0 nylon suture in a running fashion.  Sterile dressing was applied.   At the end of the case all sponge, needle, and instrument counts were correct. The patient was then positioned supine on a stretcher, Mayfield head holder was removed, she was extubated, and taken to the post-anesthesia care unit in stable hemodynamic condition.

## 2022-09-16 NOTE — H&P (Signed)
Providing Compassionate, Quality Care - Together  NEUROSURGERY HISTORY & PHYSICAL   William [name] is an 72 y.o. male.   Chief Complaint: Left TGN HPI: This is a 72 year old male, with left-sided trigeminal neuralgia who presents for surgical intervention.  He has had ongoing worsening left-sided trigeminal neuralgia symptoms V2 distribution particularly.  He has radiating symptoms from the V1 V3 distribution.  He has failed multiple medications, and has had daily attacks and is altered his lifestyle.  At this time he presents for surgical intervention, with continued complaints of primarily V2 trigeminal neuralgia.  His attacks are lancinating and lightening type of feeling.  Past Medical History:  Diagnosis Date   Allergic rhinitis    Anemia, iron deficiency    Arthritis    BPH associated with nocturia    ED (erectile dysfunction)    GERD (gastroesophageal reflux disease)    Hiatal hernia    History of COVID-19 11/2020   per pt moderate symptoms that resovled   History of hypertension    followed by pcp  (per pt resolved no medication since approx 2017)   History of kidney stones    Left ureteral calculus    Parkinson disease    neurologist--- dr tat;   mild jaw dyskinesia,   gait disorder, tremors   Peyronie's disease    PONV (postoperative nausea and vomiting)    S/P dilatation of esophageal stricture    Trigeminal neuropathy    left side   Ulcerative colitis    followed by dr Henrene Pastor (GI)//   1992 s/p  total abdominal colectomy with ileoanal anastomosis   Wears glasses     Past Surgical History:  Procedure Laterality Date   CYSTOSCOPY W/ URETERAL STENT PLACEMENT  2012   for ureteral stricture   CYSTOSCOPY WITH RETROGRADE PYELOGRAM, URETEROSCOPY AND STENT PLACEMENT Left 12/31/2021   Procedure: CYSTOSCOPY WITH RETROGRADE PYELOGRAM, FLUOROSCOPIC INTERPRETATION, URETEROSCOPY  AND STENT PLACEMENT;  Surgeon: Franchot Gallo, MD;  Location: [facility];   Service: Urology;  Laterality: Left;   CYSTOSCOPY/URETEROSCOPY/HOLMIUM LASER/STENT PLACEMENT  10/22/2005   '@[facility]'$ ;   w/ left ureteral balloon dilatation   HOLMIUM LASER APPLICATION Left 94/17/4081   Procedure: HOLMIUM LASER APPLICATION;  Surgeon: Franchot Gallo, MD;  Location: [facility];  Service: Urology;  Laterality: Left;   INGUINAL HERNIA REPAIR     per pt unilateral  approxl 1962   KNEE ARTHROSCOPY Right 02/21/2002   '@[facility]'$    LUMBAR LAMINECTOMY/DECOMPRESSION MICRODISCECTOMY N/A 06/28/2022   Procedure: L4-5 decompression with removal of synovial cyst;  Surgeon: Melina Schools, MD;  Location: [facility] [facility];  Service: Orthopedics;  Laterality: N/A;  2.5 hrs 3 C-Bed   NASAL SINUS SURGERY     x2  in 2004   RECONSTRUCTION TENDON PULLEY HAND Right 05/03/2007   '@[facility]'$ ;  right long finger   TOTAL COLECTOMY  1992   W/  ILIEOANAL ANASTOMOSIS (for ilieoanal anatomatic stricture , partial bowel obstruction and required peroidic pneumatic dilatation's)   VARICOCELECTOMY      Family History  Problem Relation Age of Onset   Cancer Father        rectal cancer and MM   Rectal cancer Father    Dementia Mother    Hypertension Mother    Heart disease Brother        valve repaired at 67   Colon cancer Neg Hx    Esophageal cancer Neg Hx    Stomach cancer Neg Hx    Social History:  reports that he quit smoking about 43 years ago. His smoking use included cigarettes. He has never used smokeless tobacco. He reports current alcohol use of about 14.0 standard drinks of alcohol per week. He reports that he does not use drugs.  Allergies: No Known Allergies  Medications Prior to Admission  Medication Sig Dispense Refill   baclofen (LIORESAL) 10 MG tablet Take 10 mg by mouth 3 (three) times daily.     carbidopa-levodopa (SINEMET IR) 25-100 MG tablet TAKE 2 TABLETS DAILY AT 7 AM,AND 2 TABLETS AT 11 AM,AND 2 TABLETS AT 4 PM 540 tablet 0   ferrous sulfate 325 (65 FE) MG tablet Take 65 mg by  mouth in the morning and at bedtime.     omeprazole (PRILOSEC) 40 MG capsule Take 1 capsule (40 mg total) by mouth 2 (two) times daily. 180 capsule 3   psyllium (METAMUCIL) 58.6 % powder Take 1 packet by mouth 2 (two) times daily.     rotigotine (NEUPRO) 6 MG/24HR Samples of this drug were given to the patient, quantity 3, Lot Number [id number] E Exp 06/24 90 patch 1   ondansetron (ZOFRAN) 4 MG tablet Take 1 tablet (4 mg total) by mouth every 8 (eight) hours as needed for nausea or vomiting. (Patient not taking: Reported on 08/30/2022) 20 tablet 0    No results found for this or any previous visit (from the past 48 hour(s)). No results found.  ROS All pertinent positives and negatives are listed in HPI above Blood pressure 135/77, pulse 75, temperature 97.9 F (36.6 C), temperature source Oral, resp. rate 18, height '5\' 7"'$  (1.702 m), weight 75.8 kg, SpO2 99 %. Physical Exam  Awake alert oriented x 3, no acute distress PERRLA Cranial nerves II through XII intact Speech fluent and appropriate Full strength in upper and lower extremities bilaterally Silt No drift  Assessment/Plan 72 year old male with  Left trigeminal neuralgia  -OR today for left microvascular decompression for trigeminal neuralgia.  We discussed all risks benefits and expected outcomes as well as alternatives to treatment.  Informed consent was obtained and witnessed.  MRI reveals a tortuous right likely etiology.    Thank you for allowing me to participate in this patient's care.  Please do not hesitate to call with questions or concerns.   Elwin Sleight, [facility] Cell: [phone]

## 2022-09-16 NOTE — Anesthesia Procedure Notes (Signed)
Procedure Name: Intubation Date/Time: 09/16/2022 7:56 AM  Performed by: Lance Coon, CRNAPre-anesthesia Checklist: Patient identified, Emergency Drugs available, Suction available, Patient being monitored and Timeout performed Patient Re-evaluated:Patient Re-evaluated prior to induction Oxygen Delivery Method: Circle system utilized Preoxygenation: Pre-oxygenation with 100% oxygen Induction Type: IV induction Ventilation: Mask ventilation without difficulty Laryngoscope Size: Miller and 3 Grade View: Grade I Tube type: Oral Tube size: 7.5 mm Number of attempts: 1 Airway Equipment and Method: Stylet Placement Confirmation: positive ETCO2, ETT inserted through vocal cords under direct vision and breath sounds checked- equal and bilateral Secured at: 22 cm Tube secured with: Tape Dental Injury: Teeth and Oropharynx as per pre-operative assessment

## 2022-09-17 MED ORDER — HYDROCODONE-ACETAMINOPHEN 5-325 MG PO TABS: 1.0000 | ORAL_TABLET | ORAL | 0 refills | Status: DC | PRN

## 2022-09-17 MED ORDER — ROTIGOTINE 6 MG/24HR TD PT24
1.0000 | MEDICATED_PATCH | Freq: Every day | TRANSDERMAL | Status: DC
  Administered 2022-09-17: 1 via TRANSDERMAL

## 2022-09-17 MED ORDER — ROTIGOTINE 6 MG/24HR TD PT24: 1.0000 | MEDICATED_PATCH | Freq: Every day | TRANSDERMAL | Status: DC

## 2022-09-17 NOTE — Progress Notes (Signed)
D/c teaching given to pt and family. Questions answered. PIVs removed.

## 2022-09-17 NOTE — Progress Notes (Signed)
   Providing Compassionate, Quality Care - Together  NEUROSURGERY PROGRESS NOTE   S: No issues overnight.  No episodes of trigeminal pain  O: EXAM:  BP (!) 162/95   Pulse 63   Temp 97.9 F (36.6 C) (Oral)   Resp 11   Ht '5\' 7"'$  (1.702 m)   Wt 75 kg   SpO2 97%   BMI 25.90 kg/m   Awake, alert, oriented x 3 PERRL Speech fluent, appropriate  CNs grossly intact  5/5 BUE/BLE  EOMI Incision clean dry and intact  ASSESSMENT:  72 y.o. male with   Left trigeminal neuralgia  Status post left microvascular decompression 09/16/2022  PLAN: -Overall he is doing well, possible DC home today pending progression with therapies and voiding -Has had no trigeminal pain since surgery -Will reorder his Neupro    Thank you for allowing me to participate in this patient's care.  Please do not hesitate to call with questions or concerns.   Elwin Sleight, [facility] Cell: [phone]

## 2022-09-17 NOTE — Discharge Summary (Signed)
  Physician Discharge Summary  Patient ID: William [name] MRN: [id number] DOB/AGE: February 13, 1951 72 y.o.  Admit date: 09/16/2022 Discharge date: 09/17/2022  Admission Diagnoses:  Left trigeminal neuralgia, primarily V2  Discharge Diagnoses:  Same Principal Problem:   Trigeminal neuralgia Active Problems:   Trigeminal neuralgia of left side of face   Discharged Condition: Stable  Hospital Course:  William [name] is a 72 y.o. male who underwent an elective left microvascular decompression for trigeminal neuralgia.  He tolerated surgery well.  Postoperatively he had no episodes of trigeminal pain.  He was monitored in the neuro ICU overnight, was doing quite well had a uneventful hospitalization.  He is ambulating independently, having normal bowel bladder function.  He is neurologically at his baseline.  His incision was clean dry and intact.  His pain was controlled with oral medications, he was tolerating a normal diet.  Treatments: Surgery -left microvascular decompression via retrosigmoid craniotomy  Discharge Exam: Blood pressure (!) 145/83, pulse 60, temperature 98.1 F (36.7 C), temperature source Axillary, resp. rate 19, height '5\' 7"'$  (1.702 m), weight 75 kg, SpO2 97 %. Awake, alert, oriented x 3 PERRLA Speech fluent, appropriate CN grossly intact 5/5 BUE/BLE Wound c/d/i  Disposition: Discharge disposition: 01-Home or Self Care        Allergies as of 09/17/2022   No Known Allergies      Medication List     TAKE these medications    baclofen 10 MG tablet Commonly known as: LIORESAL Take 10 mg by mouth 3 (three) times daily.   carbidopa-levodopa 25-100 MG tablet Commonly known as: SINEMET IR TAKE 2 TABLETS DAILY AT 7 AM,AND 2 TABLETS AT 11 AM,AND 2 TABLETS AT 4 PM   ferrous sulfate 325 (65 FE) MG tablet Take 65 mg by mouth in the morning and at bedtime.   HYDROcodone-acetaminophen 5-325 MG tablet Commonly known as: NORCO/VICODIN Take 1 tablet by mouth  every 4 (four) hours as needed for moderate pain.   Neupro 6 MG/24HR Generic drug: rotigotine Samples of this drug were given to the patient, quantity 3, Lot Number [id number] E Exp 06/24   omeprazole 40 MG capsule Commonly known as: PRILOSEC Take 1 capsule (40 mg total) by mouth 2 (two) times daily.   ondansetron 4 MG tablet Commonly known as: Zofran Take 1 tablet (4 mg total) by mouth every 8 (eight) hours as needed for nausea or vomiting.   psyllium 58.6 % powder Commonly known as: METAMUCIL Take 1 packet by mouth 2 (two) times daily.        Follow-up Information     Destine Ambroise C, DO Follow up in 2 week(s).   Contact information: [address] [place] [address] [phone]                 Signed: Theodoro Doing Annis Lagoy 09/17/2022, 5:58 PM

## 2022-09-20 ENCOUNTER — Telehealth: Payer: Self-pay | Admitting: *Deleted

## 2022-09-20 ENCOUNTER — Encounter: Payer: Self-pay | Admitting: *Deleted

## 2022-09-20 NOTE — Anesthesia Postprocedure Evaluation (Signed)
Anesthesia Post Note  Patient: William [name]  Procedure(s) Performed: RETROSIGMOID CRANI FOR MICROVASCULAR DECOMPRESSION (Left)     Patient location during evaluation: PACU Anesthesia Type: General Level of consciousness: awake and patient cooperative Pain management: pain level controlled Vital Signs Assessment: post-procedure vital signs reviewed and stable Respiratory status: spontaneous breathing, nonlabored ventilation, respiratory function stable and patient connected to nasal cannula oxygen Cardiovascular status: blood pressure returned to baseline and stable Postop Assessment: no apparent nausea or vomiting Anesthetic complications: no  No notable events documented.  Last Vitals:  Vitals:   09/17/22 1700 09/17/22 1807  BP: (!) 145/83   Pulse: 60   Resp: 19   Temp:  36.7 C  SpO2: 97%     Last Pain:  Vitals:   09/17/22 1807  TempSrc: Oral  PainSc:                  [name]

## 2022-09-20 NOTE — Patient Outreach (Signed)
  Care Coordination [facility] Note Transition Care Management Follow-up Telephone Call Date of discharge and from where: Friday, September 17, 2022 William [name]; trigeminal neuralgia; elective microvascular decompression How have you been since you were released from the hospital? "Overall doing very good; my wife is keeping a close eye out on me.  I am able to do everything I need to do to care for myself, by myself.  Getting along fine; the incision is a little sore, but not so bad that I am having to even use much of the pain medication; I try not to take the pain medication unless I really need to, because it causes constipation.  I have called Dr. Rubbie Battiest office to get my post-procedure office visit scheduled, I am waiting to hear back from them-- I will do as you have advised and ask them if there are any driving restrictions after the procedure last Friday" Any questions or concerns? No  Items Reviewed: Did the pt receive and understand the discharge instructions provided? Yes  Medications obtained and verified? Yes  full medication review completed; no concerns/ discrepancies identified today; patient confirms he self-manages medications and he denies questions/ concerns Other? No  Any new allergies since your discharge? No  Dietary orders reviewed? Yes Do you have support at home? Yes  patient reports he is essentially independent in daily self- care; wife assists as needed/ indicated  Home Care and Equipment/Supplies: Were home health services ordered? no If so, what is the name of the agency? N/A  Has the agency set up a time to come to the patient's home? no Were any new equipment or medical supplies ordered?  No What is the name of the medical supply agency? N/A Were you able to get the supplies/equipment? not applicable Do you have any questions related to the use of the equipment or supplies? No N/A  Functional Questionnaire: (I = Independent and D = Dependent) ADLs: I  wife assists as  needed  Bathing/Dressing- I  Meal Prep- I  wife assists as needed  Eating- I  Maintaining continence- I  Transferring/Ambulation- I  Managing Meds- I  Follow up appointments reviewed:  PCP Hospital f/u appt confirmed? No  Scheduled to see - on - @ [facility] f/u appt confirmed? Yes  Scheduled to see neurology provider on Thursday 09/30/22 @ 10:45 am Are transportation arrangements needed? No  If their condition worsens, is the pt aware to call PCP or go to the Emergency Dept.? Yes Was the patient provided with contact information for the PCP's office or ED? No- patient declined; reports already has contact information for all care providers Was to pt encouraged to call back with questions or concerns? Yes provided my direct contact information should questions/ concerns/ needs arise in the future  SDOH assessments and interventions completed:   Yes SDOH Interventions Today    Flowsheet Row Most Recent Value  SDOH Interventions   Food Insecurity Interventions Intervention Not Indicated  Transportation Interventions Intervention Not Indicated  [drives self,  wife assists as indicated]      Care Coordination Interventions:  Provided education around safe use of pain medication; driving restrictions when taking medications, need to clarify any driving restrictions with neurosurgery provider    Encounter Outcome:  Pt. Visit Completed    Oneta Rack, RN, BSN, CCRN Alumnus RN CM Care Coordination/ Transition of [place] Management [phone]: direct office

## 2022-09-28 ENCOUNTER — Other Ambulatory Visit: Payer: Self-pay | Admitting: Neurology

## 2022-09-28 DIAGNOSIS — G20A1 Parkinson's disease without dyskinesia, without mention of fluctuations: Secondary | ICD-10-CM

## 2022-09-28 DIAGNOSIS — G20B1 Parkinson's disease with dyskinesia, without mention of fluctuations: Secondary | ICD-10-CM

## 2022-09-28 NOTE — Progress Notes (Signed)
Assessment/Plan:   1.  Parkinsons Disease  -continue carbidopa/levodopa 25/100, 2 tablets at 7 AM, 2 tablets at 11 AM, 2 tablets at 4 PM.  -He does get some skin reaction with the rotigotine.  We talked about switching to ropinirole, but ultimately he really would like to stay with rotigotine.  He is already using the Flonase prior to applying the patch.   -Continue neupro 6 mg daily. Looks better on increased dosage, without SE   2.  Mild jaw dyskinesia  -none today but admits to intermittent. He doesn't want amantadine right now 3.  MCI  -Last neurocognitive testing in 2019.  I do not think anything clinically has significantly changed since that time.  4.  Trigeminal neuralgia  -Patient underwent left microvascular decompression of the trigeminal nerve on September 16, 2022 with Dr. Reatha Armour  -Patient with hyponatremia with carbamazepine  5.  LBP  -Follows with [facility] Subjective:   William [name] was seen today in follow up for Parkinsons disease.  My previous records were reviewed prior to todays visit as well as outside records available to me.  Patient is with his wife who supplements the history.  Patient is on rotigotine and levodopa.  The rotigotine dosage was increased last visit to 6 mg daily.  He is doing better with this. He has had no compulsive behaviors or sleep attacks.   He has had no falls.  No hallucinations.  He does have some dyskinesia but this is better.  Last visit, the patient was having more trigeminal neuralgia and we started him on Lyrica and repeated his MRI trigeminal, which continued to demonstrate the arterial branch of the left AICA passing close to the left 5th nerve.  Patient saw Dr. Reatha Armour and subsequently had surgery on January 4.  He is now off of the Lyrica.  States that he wasn't taking it anyway because it didn't agree with him. He is weaning off of the baclofen (was on the 10 mg now on the 5 mg).   Pt states that the relief was immediate.     Current prescribed movement disorder medications: Carbidopa/levodopa 25/100,2 tablets at 7 AM, 2 tablets at 11 AM, 2 tablets at 4 PM.   Rotigotine patch, 6 mg daily (increased) Lyrica, 75 mg twice a day (now off)  Prior meds:  pramipexole er (switched due to a near syncopal episode after drink of alcohol)   ALLERGIES:  No Known Allergies  CURRENT MEDICATIONS:  Outpatient Encounter Medications as of 09/30/2022  Medication Sig   baclofen (LIORESAL) 10 MG tablet Take 5 mg by mouth 3 (three) times daily.   carbidopa-levodopa (SINEMET IR) 25-100 MG tablet TAKE 2 TABLETS DAILY AT 7 AM,AND 2 TABLETS AT 11 AM,AND 2 TABLETS AT 4 PM   ferrous sulfate 325 (65 FE) MG tablet Take 65 mg by mouth in the morning and at bedtime.   HYDROcodone-acetaminophen (NORCO/VICODIN) 5-325 MG tablet Take 1 tablet by mouth every 4 (four) hours as needed for moderate pain.   omeprazole (PRILOSEC) 40 MG capsule Take 1 capsule (40 mg total) by mouth 2 (two) times daily.   ondansetron (ZOFRAN) 4 MG tablet Take 1 tablet (4 mg total) by mouth every 8 (eight) hours as needed for nausea or vomiting.   psyllium (METAMUCIL) 58.6 % powder Take 1 packet by mouth 2 (two) times daily.   rotigotine (NEUPRO) 6 MG/24HR Samples of this drug were given to the patient, quantity 3, Lot Number [id number] E Exp 06/24   [  DISCONTINUED] carbidopa-levodopa (SINEMET IR) 25-100 MG tablet TAKE 2 TABLETS DAILY AT 7 AM,AND 2 TABLETS AT 11 AM,AND 2 TABLETS AT 4 PM   No facility-administered encounter medications on file as of 09/30/2022.    Objective:   PHYSICAL EXAMINATION:    VITALS:   Vitals:   09/30/22 1047  BP: 116/82  Pulse: 60  SpO2: 98%  Weight: 173 lb 6.4 oz (78.7 kg)  Height: '5\' 7"'$  (1.702 m)    GEN:  The patient appears stated age and is in NAD. HEENT:  Normocephalic. Pt with sutures behind L ear due to recent sx.  The mucous membranes are moist. The superficial temporal arteries are without ropiness or tenderness.  Some  drooling CV:  RRR  Lungs :  ctab   Neurological examination:  Orientation: The patient is alert and oriented x3. Cranial nerves: There is good facial symmetry with mild facial hypomimia. The speech is fluent and clear. Soft palate rises symmetrically and there is no tongue deviation. Hearing is intact to conversational tone. Sensation: Sensation is intact to light touch throughout Motor: Strength is at least antigravity x4.  Movement examination: Tone: There is nl tone in the UE/LE Abnormal movements: None Coordination:  There is no decremation with any form of RAMS, including alternating supination and pronation of the forearm, hand opening and closing, finger taps, heel taps and toe taps.  Gait and Station: The patient ambulates well    I have reviewed and interpreted the following labs independently    Chemistry      Component Value Date/Time   NA 143 09/16/2022 1148   K 4.0 09/16/2022 1148   CL 108 09/02/2022 0935   CO2 24 09/02/2022 0935   BUN 27 (H) 09/02/2022 0935   CREATININE 1.25 (H) 09/02/2022 0935   CREATININE 1.07 06/27/2018 0915      Component Value Date/Time   CALCIUM 9.2 09/02/2022 0935   ALKPHOS 39 09/02/2022 0935   AST 22 09/02/2022 0935   ALT 15 09/02/2022 0935   BILITOT 0.9 09/02/2022 0935       Lab Results  Component Value Date   WBC 3.7 (L) 09/02/2022   HGB 10.9 (L) 09/16/2022   HCT 32.0 (L) 09/16/2022   MCV 96.6 09/02/2022   PLT 156 09/02/2022    Lab Results  Component Value Date   TSH 2.40 11/28/2018       Cc:  Plotnikov, Evie Lacks, MD

## 2022-09-30 ENCOUNTER — Ambulatory Visit: Payer: Medicare HMO | Admitting: Neurology

## 2022-09-30 ENCOUNTER — Encounter: Payer: Self-pay | Admitting: Neurology

## 2022-09-30 VITALS — BP 116/82 | HR 60 | Ht 67.0 in | Wt 173.4 lb

## 2022-09-30 DIAGNOSIS — G20A1 Parkinson's disease without dyskinesia, without mention of fluctuations: Secondary | ICD-10-CM

## 2022-09-30 DIAGNOSIS — G20B1 Parkinson's disease with dyskinesia, without mention of fluctuations: Secondary | ICD-10-CM

## 2022-09-30 MED ORDER — CARBIDOPA-LEVODOPA 25-100 MG PO TABS: ORAL_TABLET | ORAL | 2 refills | Status: DC

## 2022-09-30 NOTE — Patient Instructions (Signed)
Local and Online Resources for Power over Parkinson's Group  January 2024   LOCAL [place] PARKINSON'S GROUPS   Power over Parkinson's Group:    Power Over Parkinson's Patient Education Group will be Wednesday, January 10th-*Hybrid meting*- in person at [facility] location and via [facility], 2:00-3:00 pm.   Starting in November, Power over [employer] and Care Partner Groups will meet together, with plans for separate break out session for caregivers (*this will be evolving over the next few months) Upcoming Power over Parkinson's Meetings/Care Partner Support:  2nd Wednesdays of the month at 2 pm:   January 10th, February 14th [place] at [email or web address] if interested in participating in this group    Lilly! Moves Dyne[employer] Instructor-Led Classes offering at [employer]!  TUESDAYS and Wednesdays 1-2 pm.   Contact Vonna Kotyk at  [email or web address]  or [phone] (Tuesday classes are modified for chair and standing only) Drumming for Parkinson's will be held on 2nd and 4th Mondays at 11:00 am.   Located at the [place] ([place]  Contact Doylene Canning at [email or web address] or [place] Class, Mondays at 11 am.  Call [phone] for details Let's Try Pickleball-$25 for 6 weeks of Pickleball in [place].  Contact Corwin Levins for more details and for dates.  [email or web address] SAVE THE DATE and REGISTER:  [place] Chapter of Parkinson's Foundation:  Parkinson's Symposium.  Conversations about Parkinson's.  Saturday, November 13, 2022, 9:00 am-2:00 pm.  [place], *In person or online via Pablo Pena*.  Register at [email or web address] or call Beverlee Nims at [phone].   [place]:  [email or web address]  PD Health at Home continues:   Mindfulness Mondays, Wellness Wednesdays, Fitness Fridays   Upcoming Education:    Environmental health practitioner your Voice:  Air cabin crew. Wednesday, January 3rd,  1-2 pm  Managing Weight Loss & Retaining Muscle Mass.  Wednesday, Jan 10th, 1-2 pm Changes in Speech and Voice.  Wednesday, January 17th, 1-2 pm Register for virtual education and Patent attorney (webinars) at [email or web address] Please check out their website to sign up for emails and see their full online offerings      [place]:  [email or web address]   Third Thursday Webinars:  On the third Thursday of every month at 12 p.m. ET, join our free live webinars to learn about various aspects of living with Parkinson's disease and our work to speed medical breakthroughs.  Upcoming Webinar:  New Year, New Moves!  Explore Exercise for Life with Parkinson's.  Thursday, January 18th at 12 noon. Check out additional information on their website to see their full online offerings    [facility]:  [email or web address]  Upcoming Webinar:   Physical Therapy and Parkinson's.  Thursday, January 11th, 2 pm  Webinar Series:  Living with Parkinson's Meetup.   Third Thursdays each month, 3 pm  Care Partner Monthly Meetup.  With Robin Searing Phinney.  First Tuesday of each month, 2 pm  Check out additional information to Live Well Today on their website    Parkinson and Movement Disorders (PMD) Alliance:  www.[email or web address]  [employer]uroLife Online:  Online Education Events  Sign up for emails, which are sent weekly to give you updates on programming and online offerings    Parkinson's Association of the [place]:  www.parkinsonassociation.org  Information on online support groups, education events, and online exercises including Yoga, Parkinson's exercises  and more-LOTS of information on links to PD resources and online events  Virtual Support Group through  Parkinson's Association of the [place]; next one is scheduled for Wednesday, Feb 7th  MOVEMENT AND EXERCISE OPPORTUNITIES  PWR! Moves Classes at [place].  Wednesdays 10 and 11 am.   Contact Amy Gerrit Friends, PT [email or web address] if interested.  NEW PWR! Moves Class offerings at [employer].  *TUESDAYS* and Wednesdays 1-2 pm.    Contact Vonna Kotyk at  [email or web address]    Parkinson's Wellness Recovery (PWR! Moves)  www.[email or web address]  Info on the PWR! Virtual Experience:  You will have access to our expertise?through self-assessment, guided plans that start with the PD-specific fundamentals, educational content, tips, Q&A with an expert, and a growing Art therapist of PD-specific pre-recorded and live exercise classes of varying types and intensity - both physical and cognitive! If that is not enough, we offer 1:1 wellness consultations (in-person or virtual) to personalize your PWR! Research scientist (medical).   [place] Fridays:   As part of the PD Health @ Home program, this free video series focuses each week on one aspect of fitness designed to support people living with Parkinson's.? These weekly videos highlight the Murphy fitness guidelines for people with Parkinson's disease.  ModemGamers.si   Dance for PD website is offering free, live-stream classes throughout the week, as well as links to [employer] of classes:  [email or web address]  Virtual dance and Pilates for Parkinson's classes: Click on the Community Tab> Parkinson's Movement Initiative Tab.  To register for classes and for more information, visit [email or web address] and click the "community" tab.   [employer] Parkinson's Cycling Classes   [employer]:  Thursdays @ Noon-Live classes at [employer] ([employer] at [email or web address]?or [phone])  [employer]: Virtual  Classes Mondays and Thursdays Jeanette Caprice classes Tuesday, Wednesday and Thursday (contact [place] at [email or web address] ?or [phone])  [place]  Varied levels of classes are offered Tuesdays and Thursdays at [employer].   Stretching with Verdis Frederickson weekly class is also offered for people with Parkinson's  To observe a class or for more information, call [phone] or email Hezzie Bump at [email or web address]   ADDITIONAL SUPPORT AND RESOURCES  Well[employer]Spring [employer]:Online Caregiver Education Opportunities:  www.well-springsolutions.org/caregiver-education/caregiver-support-group.  You may also contact Vickki Muff at [email or web address] or [phone].     Well-Spring Navigator:  Just1Navigator program, a?free service to help individuals and families through the journey of determining care for older adults.  The "Navigator" is a [phone], Education officer, museum, who will speak with a prospective client and/or loved ones to provide an assessment of the situation and a set of recommendations for a personalized care plan -- all free of charge, and whether?Well-Spring [employer] offers the needed service or not. If the need is not a service we provide, we are well-connected with reputable programs in town that we can refer you to.  www.well-springsolutions.org or to speak with the Navigator, call [phone].

## 2022-10-26 DIAGNOSIS — L255 Unspecified contact dermatitis due to plants, except food: Secondary | ICD-10-CM | POA: Diagnosis not present

## 2022-10-26 DIAGNOSIS — X32XXXD Exposure to sunlight, subsequent encounter: Secondary | ICD-10-CM | POA: Diagnosis not present

## 2022-10-26 DIAGNOSIS — L820 Inflamed seborrheic keratosis: Secondary | ICD-10-CM | POA: Diagnosis not present

## 2022-10-26 DIAGNOSIS — L814 Other melanin hyperpigmentation: Secondary | ICD-10-CM | POA: Diagnosis not present

## 2022-10-26 DIAGNOSIS — L570 Actinic keratosis: Secondary | ICD-10-CM | POA: Diagnosis not present

## 2022-11-08 ENCOUNTER — Encounter: Payer: Self-pay | Admitting: Internal Medicine

## 2022-11-08 NOTE — Progress Notes (Unsigned)
    Subjective:    Patient ID: William [name], male    DOB: [birth year], 72 y.o.   MRN: [id number]      HPI Anshul is here for No chief complaint on file.    Sore throat x several days -     Medications and allergies reviewed with patient and updated if appropriate.  Current Outpatient Medications on File Prior to Visit  Medication Sig Dispense Refill   baclofen (LIORESAL) 10 MG tablet Take 5 mg by mouth 3 (three) times daily.     carbidopa-levodopa (SINEMET IR) 25-100 MG tablet 2 at 7am/2 at 11am/2 at 4pm 540 tablet 2   ferrous sulfate 325 (65 FE) MG tablet Take 65 mg by mouth in the morning and at bedtime.     HYDROcodone-acetaminophen (NORCO/VICODIN) 5-325 MG tablet Take 1 tablet by mouth every 4 (four) hours as needed for moderate pain. 30 tablet 0   omeprazole (PRILOSEC) 40 MG capsule Take 1 capsule (40 mg total) by mouth 2 (two) times daily. 180 capsule 3   ondansetron (ZOFRAN) 4 MG tablet Take 1 tablet (4 mg total) by mouth every 8 (eight) hours as needed for nausea or vomiting. 20 tablet 0   psyllium (METAMUCIL) 58.6 % powder Take 1 packet by mouth 2 (two) times daily.     rotigotine (NEUPRO) 6 MG/24HR Samples of this drug were given to the patient, quantity 3, Lot Number [id number] E Exp 06/24 90 patch 1   No current facility-administered medications on file prior to visit.    Review of Systems     Objective:  There were no vitals filed for this visit. BP Readings from Last 3 Encounters:  09/30/22 116/82  09/17/22 (!) 145/83  09/02/22 137/74   Wt Readings from Last 3 Encounters:  09/30/22 173 lb 6.4 oz (78.7 kg)  09/16/22 165 lb 5.5 oz (75 kg)  09/02/22 177 lb 3.2 oz (80.4 kg)   There is no height or weight on file to calculate BMI.    Physical Exam         Assessment & Plan:    See Problem List for Assessment and Plan of chronic medical problems.

## 2022-11-09 ENCOUNTER — Ambulatory Visit (INDEPENDENT_AMBULATORY_CARE_PROVIDER_SITE_OTHER): Payer: Medicare HMO | Admitting: Internal Medicine

## 2022-11-09 VITALS — BP 110/72 | HR 59 | Temp 97.8°F | Ht 67.0 in | Wt 175.0 lb

## 2022-11-09 DIAGNOSIS — J069 Acute upper respiratory infection, unspecified: Secondary | ICD-10-CM

## 2022-11-09 DIAGNOSIS — J029 Acute pharyngitis, unspecified: Secondary | ICD-10-CM | POA: Diagnosis not present

## 2022-11-09 LAB — POC COVID19 BINAXNOW: SARS Coronavirus 2 Ag: NEGATIVE

## 2022-11-09 LAB — POCT RAPID STREP A (OFFICE): Rapid Strep A Screen: NEGATIVE

## 2022-11-09 MED ORDER — AMOXICILLIN-POT CLAVULANATE 875-125 MG PO TABS: 1.0000 | ORAL_TABLET | Freq: Two times a day (BID) | ORAL | 0 refills | Status: AC

## 2022-11-09 NOTE — Patient Instructions (Addendum)
    Your strep test is negative.   Your covid test is negative.       Medications changes include :   Augmentin x 7 days      Return if symptoms worsen or fail to improve.

## 2022-11-15 ENCOUNTER — Other Ambulatory Visit: Payer: Self-pay | Admitting: Neurology

## 2022-11-15 NOTE — Telephone Encounter (Signed)
1. Which medications need refilled? (List name and dosage, if known) rotigotine patch  2. Which pharmacy/location is medication to be sent to? (include street and city if local pharmacy) [facility]  90 day supply

## 2022-12-03 ENCOUNTER — Encounter: Payer: Self-pay | Admitting: Internal Medicine

## 2022-12-03 ENCOUNTER — Ambulatory Visit (INDEPENDENT_AMBULATORY_CARE_PROVIDER_SITE_OTHER): Payer: Medicare HMO | Admitting: Internal Medicine

## 2022-12-03 VITALS — BP 120/80 | HR 73 | Temp 97.6°F | Ht 67.0 in | Wt 174.0 lb

## 2022-12-03 DIAGNOSIS — J069 Acute upper respiratory infection, unspecified: Secondary | ICD-10-CM | POA: Diagnosis not present

## 2022-12-03 MED ORDER — PROMETHAZINE-DM 6.25-15 MG/5ML PO SYRP: 5.0000 mL | ORAL_SOLUTION | Freq: Four times a day (QID) | ORAL | 0 refills | Status: DC | PRN

## 2022-12-03 NOTE — Progress Notes (Signed)
   Subjective:   Patient ID: William [name], male    DOB: 1951-01-11, 72 y.o.   MRN: [id number]  HPI The patient is a 72 YO man coming in for URI symptoms 4 days.  Review of Systems  Constitutional:  Negative for activity change, appetite change, chills, fatigue, fever and unexpected weight change.  HENT:  Positive for congestion, postnasal drip, rhinorrhea and sinus pressure. Negative for ear discharge, ear pain, sinus pain, sneezing, sore throat, tinnitus, trouble swallowing and voice change.   Eyes: Negative.   Respiratory:  Positive for cough. Negative for chest tightness, shortness of breath and wheezing.   Cardiovascular: Negative.   Gastrointestinal: Negative.   Musculoskeletal:  Positive for myalgias.  Neurological: Negative.     Objective:  Physical Exam Constitutional:      Appearance: He is well-developed.  HENT:     Head: Normocephalic and atraumatic.     Comments: Oropharynx with redness and clear drainage, nose with swollen turbinates, TMs normal bilaterally.  Neck:     Thyroid: No thyromegaly.  Cardiovascular:     Rate and Rhythm: Normal rate and regular rhythm.  Pulmonary:     Effort: Pulmonary effort is normal. No respiratory distress.     Breath sounds: Normal breath sounds. No wheezing or rales.  Abdominal:     Palpations: Abdomen is soft.  Musculoskeletal:        General: No tenderness.     Cervical back: Normal range of motion.  Lymphadenopathy:     Cervical: No cervical adenopathy.  Skin:    General: Skin is warm and dry.  Neurological:     Mental Status: He is alert and oriented to person, place, and time.     Vitals:   12/03/22 1524  BP: 120/80  Pulse: 73  Temp: 97.6 F (36.4 C)  TempSrc: Oral  SpO2: 97%  Weight: 174 lb (78.9 kg)  Height: 5\' 7"  (1.702 m)    Assessment & Plan:  Depo-medrol 40 mg IM given

## 2022-12-03 NOTE — Patient Instructions (Signed)
We have given you the steroid shot and sent in the cough medicine.

## 2022-12-03 NOTE — Assessment & Plan Note (Signed)
Given depo-medrol 40 mg IM at visit and rx done for promethazine/dm to help with cough and sleeping. No antibiotics are indicated at this time.

## 2022-12-06 ENCOUNTER — Telehealth: Payer: Self-pay

## 2022-12-06 DIAGNOSIS — J069 Acute upper respiratory infection, unspecified: Secondary | ICD-10-CM | POA: Diagnosis not present

## 2022-12-06 MED ORDER — METHYLPREDNISOLONE ACETATE 40 MG/ML IJ SUSP
40.0000 mg | Freq: Once | INTRAMUSCULAR | Status: AC
  Administered 2022-12-06: 40 mg via INTRAMUSCULAR

## 2022-12-06 NOTE — Addendum Note (Signed)
Addended by: Macie Burows on: 12/06/2022 11:08 AM   Modules accepted: Orders

## 2022-12-06 NOTE — Telephone Encounter (Signed)
Contacted William [name] to schedule their annual wellness visit. Appointment made for 12/13/22.  William [name], [name] (AAMA)  [place] [facility] [phone]

## 2022-12-07 ENCOUNTER — Telehealth: Payer: Self-pay | Admitting: Internal Medicine

## 2022-12-07 NOTE — Telephone Encounter (Unsigned)
Patient was seen last week and told to call this week if he is not any better.  Patient states he is still congested and not feeling better.  Please call patient and advise next steps to take.  Patient's number:  [phone]

## 2022-12-08 ENCOUNTER — Telehealth: Payer: Medicare HMO | Admitting: Physician Assistant

## 2022-12-08 ENCOUNTER — Telehealth: Payer: Self-pay

## 2022-12-08 VITALS — BP 134/65 | HR 74

## 2022-12-08 DIAGNOSIS — B9689 Other specified bacterial agents as the cause of diseases classified elsewhere: Secondary | ICD-10-CM | POA: Diagnosis not present

## 2022-12-08 DIAGNOSIS — J208 Acute bronchitis due to other specified organisms: Secondary | ICD-10-CM | POA: Diagnosis not present

## 2022-12-08 MED ORDER — DOXYCYCLINE HYCLATE 100 MG PO TABS: 100.0000 mg | ORAL_TABLET | Freq: Two times a day (BID) | ORAL | 0 refills | Status: DC

## 2022-12-08 MED ORDER — BENZONATATE 100 MG PO CAPS: 100.0000 mg | ORAL_CAPSULE | Freq: Three times a day (TID) | ORAL | 0 refills | Status: DC | PRN

## 2022-12-08 NOTE — Telephone Encounter (Signed)
Pt was called an offered Neupro 6mg  samples. Pt was very happy and stated that he would come an pick them up,  Medication Samples have been provided to the patient.  Drug name: Neupro       Strength: 6mg         Qty: 10 Boxes  [id number]  Exp.Date: 6/24  Dosing instructions: use as directed   The patient has been instructed regarding the correct time, dose, and frequency of taking this medication, including desired effects and most common side effects.

## 2022-12-08 NOTE — Patient Instructions (Signed)
William [name], thank you for joining [name], PA-C for today's virtual visit.  While this provider is not your primary care provider (PCP), if your PCP is located in our provider database this encounter information will be shared with them immediately following your visit.   [place] account gives you access to today's visit and all your visits, tests, and labs performed at [facility] " click here if you don't have a [place] account or go to [email or web address]  Consent: (Patient) William [name] provided verbal consent for this virtual visit at the beginning of the encounter.  Current Medications:  Current Outpatient Medications:    betamethasone dipropionate 0.05 % cream, Apply topically 2 (two) times daily as needed., Disp: , Rfl:    carbidopa-levodopa (SINEMET IR) 25-100 MG tablet, 2 at 7am/2 at 11am/2 at 4pm, Disp: 540 tablet, Rfl: 2   ferrous sulfate 325 (65 FE) MG tablet, Take 65 mg by mouth in the morning and at bedtime., Disp: , Rfl:    omeprazole (PRILOSEC) 40 MG capsule, Take 1 capsule (40 mg total) by mouth 2 (two) times daily., Disp: 180 capsule, Rfl: 3   promethazine-dextromethorphan (PROMETHAZINE-DM) 6.25-15 MG/5ML syrup, Take 5 mLs by mouth 4 (four) times daily as needed., Disp: 118 mL, Rfl: 0   psyllium (METAMUCIL) 58.6 % powder, Take 1 packet by mouth 2 (two) times daily., Disp: , Rfl:    rotigotine (NEUPRO) 6 MG/24HR, APPLY 1 PATCH ONTO THE SKIN EVERY DAY, Disp: 90 patch, Rfl: 1   Medications ordered in this encounter:  No orders of the defined types were placed in this encounter.    *If you need refills on other medications prior to your next appointment, please contact your pharmacy*  Follow-Up: Call back or seek an in-person evaluation if the symptoms worsen or if the condition fails to improve as anticipated.  [place] [phone]  Other Instructions Take antibiotic (doxycycline) as  directed.  Increase fluids.  Get plenty of rest. Use Mucinex for congestion.  Stop the promethazine DM syrup.  You can start the Tessalon as directed for cough. Take a daily probiotic (I recommend Align or Culturelle, but even Activia Yogurt may be beneficial).  A humidifier placed in the bedroom may offer some relief for a dry, scratchy throat of nasal irritation.  Read information below on acute bronchitis. Please call or return to clinic if symptoms are not improving.  Acute Bronchitis Bronchitis is when the airways that extend from the windpipe into the lungs get red, puffy, and painful (inflamed). Bronchitis often causes thick spit (mucus) to develop. This leads to a cough. A cough is the most common symptom of bronchitis. In acute bronchitis, the condition usually begins suddenly and goes away over time (usually in 2 weeks). Smoking, allergies, and asthma can make bronchitis worse. Repeated episodes of bronchitis may cause more lung problems.  HOME CARE Rest. Drink enough fluids to keep your pee (urine) clear or pale yellow (unless you need to limit fluids as told by your doctor). Only take over-the-counter or prescription medicines as told by your doctor. Avoid smoking and secondhand smoke. These can make bronchitis worse. If you are a smoker, think about using nicotine gum or skin patches. Quitting smoking will help your lungs heal faster. Reduce the chance of getting bronchitis again by: Washing your hands often. Avoiding people with cold symptoms. Trying not to touch your hands to your mouth, nose, or eyes. Follow up with your  doctor as told.  GET HELP IF: Your symptoms do not improve after 1 week of treatment. Symptoms include: Cough. Fever. Coughing up thick spit. Body aches. Chest congestion. Chills. Shortness of breath. Sore throat.  GET HELP RIGHT AWAY IF:  You have an increased fever. You have chills. You have severe shortness of breath. You have bloody thick spit  (sputum). You throw up (vomit) often. You lose too much body fluid (dehydration). You have a severe headache. You faint.  MAKE SURE YOU:  Understand these instructions. Will watch your condition. Will get help right away if you are not doing well or get worse. Document Released: 02/16/2008 Document Revised: 05/02/2013 Document Reviewed: 02/20/2013 [facility] Patient Information 2015 [place], [place]. This information is not intended to replace advice given to you by your health care provider. Make sure you discuss any questions you have with your health care provider.    If you have been instructed to have an in-person evaluation today at a local Urgent Care facility, please use the link below. It will take you to a list of all of our available [facility], including address, phone number and hours of operation. Please do not delay care.  [facility]  If you or a family member do not have a primary care provider, use the link below to schedule a visit and establish care. When you choose a [facility] primary care physician or advanced practice provider, you gain a long-term partner in health. Find a Primary Care Provider  Learn more about 's in-office and virtual care options: [facility]

## 2022-12-08 NOTE — Progress Notes (Signed)
Virtual Visit Consent   William [name], you are scheduled for a virtual visit with a [facility] provider today. Just as with appointments in the office, your consent must be obtained to participate. Your consent will be active for this visit and any virtual visit you may have with one of our providers in the next 365 days. If you have a MyChart account, a copy of this consent can be sent to you electronically.  As this is a virtual visit, video technology does not allow for your provider to perform a traditional examination. This may limit your provider's ability to fully assess your condition. If your provider identifies any concerns that need to be evaluated in person or the need to arrange testing (such as labs, EKG, etc.), we will make arrangements to do so. Although advances in technology are sophisticated, we cannot ensure that it will always work on either your end or our end. If the connection with a video visit is poor, the visit may have to be switched to a telephone visit. With either a video or telephone visit, we are not always able to ensure that we have a secure connection.  By engaging in this virtual visit, you consent to the provision of healthcare and authorize for your insurance to be billed (if applicable) for the services provided during this visit. Depending on your insurance coverage, you may receive a charge related to this service.  I need to obtain your verbal consent now. Are you willing to proceed with your visit today? [name] has provided verbal consent on 12/08/2022 for a virtual visit (video or telephone). [name], [place]  Date: 12/08/2022 8:30 AM  Virtual Visit via Video Note   I, [name], connected with  CHANCELLER SINSEL  ([id number], 1951/01/26) on 12/08/22 at  8:15 AM EDT by a video-enabled telemedicine application and verified that I am speaking with the correct person using two identifiers.  Location: Patient: Virtual Visit Location  Patient: Home Provider: Virtual Visit Location Provider: Home Office   I discussed the limitations of evaluation and management by telemedicine and the availability of in person appointments. The patient expressed understanding and agreed to proceed.    History of Present Illness: William [name] is a 72 y.o. who identifies as a male who was assigned male at birth, and is being seen today for for continued URI symptoms after recent visit with PCP on 3/22 at which time he was diagnosed with a viral URI.  Was started on Promethazine DM cough syrup and given a steroid injection in office.  Patient endorses despite this symptoms have continued to progress.  Now with increased head congestion and sinus discomfort.  Chest congestion has now changed to a thick, "chunky", colored discharge.  Denies fever.  Denies any chest pain or shortness of breath.  Has been taking Mucinex over-the-counter.  Has not been able to tolerate the Promethazine DM cough syrup as it makes him very drowsy.       HPI: HPI  Problems:  Patient Active Problem List   Diagnosis Date Noted   Trigeminal neuralgia 09/16/2022   Synovial cyst of lumbar facet joint 06/28/2022   Degeneration of lumbar intervertebral disc 01/06/2022   Low back pain 12/22/2021   Trigeminal neuralgia of left side of face 10/26/2021   Somatic dysfunction of right sacroiliac joint 02/18/2020   Cough 12/09/2019   Hypertensive disorder 07/23/2019   Lumbosacral spondylosis without myelopathy 05/01/2019   Viral URI 01/20/2019  Pain in right knee 02/15/2018   Chest pain 05/28/2017   Hyperlipidemia 05/28/2017   Bradycardia 05/28/2017   Chest pain, atypical 05/27/2017   Lightheadedness 05/27/2017   Finger laceration 04/04/2017   Parkinson's disease 02/26/2016   Eustachian tube dysfunction 02/06/2016   Hyponatremia 01/26/2016   Tremor 11/12/2015   Visual-spatial impairment 11/12/2015   Stress at work 10/24/2014   Nonspecific abnormal  electrocardiogram (ECG) (EKG) 06/18/2014   S/P colectomy 06/18/2014   Well adult exam 06/18/2014   Renal stones 06/18/2014   Elevated BP 06/18/2014   Headache(784.0) 04/25/2014   Essential hypertension, benign 04/25/2014   Serous otitis media 03/21/2014   Travel advice encounter 04/17/2013   Calcium nephrolithiasis 10/04/2012   Routine health maintenance 10/04/2012   Acute left flank pain 08/03/2011   Unspecified intestinal obstruction 12/03/2008   Other diseases of nasal cavity and sinuses(478.19) 05/30/2008   GERD 02/26/2008   PEPTIC STRICTURE 02/22/2008   HIATAL HERNIA 02/22/2008   Chest tightness or pressure 01/09/2008   ERECTILE DYSFUNCTION 04/07/2007   Allergic rhinitis 04/07/2007   COLITIS, ULCERATIVE NOS 04/07/2007   GASTRIC POLYP, HX OF 04/07/2007    Allergies: No Known Allergies Medications:  Current Outpatient Medications:    benzonatate (TESSALON) 100 MG capsule, Take 1 capsule (100 mg total) by mouth 3 (three) times daily as needed for cough., Disp: 30 capsule, Rfl: 0   doxycycline (VIBRA-TABS) 100 MG tablet, Take 1 tablet (100 mg total) by mouth 2 (two) times daily., Disp: 14 tablet, Rfl: 0   betamethasone dipropionate 0.05 % cream, Apply topically 2 (two) times daily as needed., Disp: , Rfl:    carbidopa-levodopa (SINEMET IR) 25-100 MG tablet, 2 at 7am/2 at 11am/2 at 4pm, Disp: 540 tablet, Rfl: 2   ferrous sulfate 325 (65 FE) MG tablet, Take 65 mg by mouth in the morning and at bedtime., Disp: , Rfl:    omeprazole (PRILOSEC) 40 MG capsule, Take 1 capsule (40 mg total) by mouth 2 (two) times daily., Disp: 180 capsule, Rfl: 3   promethazine-dextromethorphan (PROMETHAZINE-DM) 6.25-15 MG/5ML syrup, Take 5 mLs by mouth 4 (four) times daily as needed., Disp: 118 mL, Rfl: 0   psyllium (METAMUCIL) 58.6 % powder, Take 1 packet by mouth 2 (two) times daily., Disp: , Rfl:    rotigotine (NEUPRO) 6 MG/24HR, APPLY 1 PATCH ONTO THE SKIN EVERY DAY, Disp: 90 patch, Rfl:  1  Observations/Objective: Patient is well-developed, well-nourished in no acute distress.  Resting comfortably  at home.  Head is normocephalic, atraumatic.  No labored breathing.  Speech is clear and coherent with logical content.  Patient is alert and oriented at baseline.   Assessment and Plan: 1. Acute bacterial bronchitis - benzonatate (TESSALON) 100 MG capsule; Take 1 capsule (100 mg total) by mouth 3 (three) times daily as needed for cough.  Dispense: 30 capsule; Refill: 0 - doxycycline (VIBRA-TABS) 100 MG tablet; Take 1 tablet (100 mg total) by mouth 2 (two) times daily.  Dispense: 14 tablet; Refill: 0  Rx doxycycline.  Increase fluids.  Rest.  Saline nasal spray.  Probiotic.  Mucinex as directed.  Humidifier in bedroom.  Stop the Promethazine DM.  Tessalon per orders.  Call or return to clinic if symptoms are not improving.   Follow Up Instructions: I discussed the assessment and treatment plan with the patient. The patient was provided an opportunity to ask questions and all were answered. The patient agreed with the plan and demonstrated an understanding of the instructions.  A copy of instructions were  sent to the patient via MyChart unless otherwise noted below.   The patient was advised to call back or seek an in-person evaluation if the symptoms worsen or if the condition fails to improve as anticipated.  Time:  I spent 10 minutes with the patient via telehealth technology discussing the above problems/concerns.    [name], PA-C

## 2022-12-13 ENCOUNTER — Ambulatory Visit (INDEPENDENT_AMBULATORY_CARE_PROVIDER_SITE_OTHER): Payer: Medicare HMO

## 2022-12-13 VITALS — Ht 67.0 in | Wt 168.0 lb

## 2022-12-13 DIAGNOSIS — Z0000 Encounter for general adult medical examination without abnormal findings: Secondary | ICD-10-CM | POA: Diagnosis not present

## 2022-12-13 NOTE — Progress Notes (Addendum)
I connected with  [name] on 12/13/22 by a audio enabled telemedicine application and verified that I am speaking with the correct person using two identifiers.  Patient Location: Home  Provider Location: Office/Clinic  I discussed the limitations of evaluation and management by telemedicine. The patient expressed understanding and agreed to proceed.  Patient Medicare AWV questionnaire was completed by the patient on 12/09/2022; I have confirmed that all information answered by patient is correct and no changes since this date.     Subjective:   William [name] is a 72 y.o. male who presents for Medicare Annual/Subsequent preventive examination.  Review of Systems     Cardiac Risk Factors include: hypertension;family history of premature cardiovascular disease;male gender;advanced age (>74men, >91 women)     Objective:    Today's Vitals   12/13/22 1534 12/13/22 1543  Weight: 168 lb (76.2 kg)   Height: 5\' 7"  (1.702 m)   PainSc: 0-No pain 0-No pain   Body mass index is 26.31 kg/m.     12/13/2022    3:49 PM 09/30/2022   10:48 AM 09/16/2022    3:00 PM 09/02/2022    9:16 AM 06/28/2022    6:31 AM 06/18/2022    1:20 PM 03/30/2022    9:07 AM  Advanced Directives  Does Patient Have a Medical Advance Directive? Yes Yes No No No Yes Yes  Type of Paramedic of [place];Living will Living will    [place];Living will [place];Living will;Out of facility DNR (pink MOST or yellow form)  Copy of [place] in Chart? No - copy requested     No - copy requested   Would patient like information on creating a medical advance directive?   No - Patient declined No - Patient declined       Current Medications (verified) Outpatient Encounter Medications as of 12/13/2022  Medication Sig   benzonatate (TESSALON) 100 MG capsule Take 1 capsule (100 mg total) by mouth 3 (three) times daily as needed for cough.    betamethasone dipropionate 0.05 % cream Apply topically 2 (two) times daily as needed.   carbidopa-levodopa (SINEMET IR) 25-100 MG tablet 2 at 7am/2 at 11am/2 at 4pm   doxycycline (VIBRA-TABS) 100 MG tablet Take 1 tablet (100 mg total) by mouth 2 (two) times daily.   ferrous sulfate 325 (65 FE) MG tablet Take 65 mg by mouth in the morning and at bedtime.   omeprazole (PRILOSEC) 40 MG capsule Take 1 capsule (40 mg total) by mouth 2 (two) times daily.   promethazine-dextromethorphan (PROMETHAZINE-DM) 6.25-15 MG/5ML syrup Take 5 mLs by mouth 4 (four) times daily as needed.   psyllium (METAMUCIL) 58.6 % powder Take 1 packet by mouth 2 (two) times daily.   rotigotine (NEUPRO) 6 MG/24HR APPLY 1 PATCH ONTO THE SKIN EVERY DAY   No facility-administered encounter medications on file as of 12/13/2022.    Allergies (verified) Patient has no known allergies.   History: Past Medical History:  Diagnosis Date   Allergic rhinitis    Anemia, iron deficiency    Arthritis    BPH associated with nocturia    ED (erectile dysfunction)    GERD (gastroesophageal reflux disease)    Hiatal hernia    History of COVID-19 11/2020   per pt moderate symptoms that resovled   History of hypertension    followed by pcp  (per pt resolved no medication since approx 2017)   History of kidney stones  Left ureteral calculus    Parkinson disease    neurologist--- dr tat;   mild jaw dyskinesia,   gait disorder, tremors   Peyronie's disease    PONV (postoperative nausea and vomiting)    S/P dilatation of esophageal stricture    Trigeminal neuropathy    left side   Ulcerative colitis    followed by dr Henrene Pastor (GI)//   1992 s/p  total abdominal colectomy with ileoanal anastomosis   Wears glasses    Past Surgical History:  Procedure Laterality Date   CRANIECTOMY Left 09/16/2022   Procedure: RETROSIGMOID CRANI FOR MICROVASCULAR DECOMPRESSION;  Surgeon: Karsten Ro, DO;  Location: [facility];  Service: Neurosurgery;   Laterality: Left;   CYSTOSCOPY W/ URETERAL STENT PLACEMENT  2012   for ureteral stricture   CYSTOSCOPY WITH RETROGRADE PYELOGRAM, URETEROSCOPY AND STENT PLACEMENT Left 12/31/2021   Procedure: CYSTOSCOPY WITH RETROGRADE PYELOGRAM, FLUOROSCOPIC INTERPRETATION, URETEROSCOPY  AND STENT PLACEMENT;  Surgeon: Franchot Gallo, MD;  [facility];  Service: Urology;  Laterality: Left;   CYSTOSCOPY/URETEROSCOPY/HOLMIUM LASER/STENT PLACEMENT  10/22/2005   @[facility] ;   w/ left ureteral balloon dilatation   HOLMIUM LASER APPLICATION Left [id number]   Procedure: HOLMIUM LASER APPLICATION;  Surgeon: Franchot Gallo, MD;  Location: [facility];  Service: Urology;  Laterality: Left;   INGUINAL HERNIA REPAIR     per pt unilateral  approxl 1962   KNEE ARTHROSCOPY Right 02/21/2002   @[facility]    LUMBAR LAMINECTOMY/DECOMPRESSION MICRODISCECTOMY N/A 06/28/2022   Procedure: L4-5 decompression with removal of synovial cyst;  Surgeon: Melina Schools, MD;  Location: [facility];  Service: Orthopedics;  Laterality: N/A;  2.5 hrs 3 C-Bed   NASAL SINUS SURGERY     x2  in 2004   RECONSTRUCTION TENDON PULLEY HAND Right 05/03/2007   @[facility] ;  right long finger   TOTAL COLECTOMY  1992   W/  ILIEOANAL ANASTOMOSIS (for ilieoanal anatomatic stricture , partial bowel obstruction and required peroidic pneumatic dilatation's)   VARICOCELECTOMY     Family History  Problem Relation Age of Onset   Cancer Father        rectal cancer and MM   Rectal cancer Father    Dementia Mother    Hypertension Mother    Heart disease Brother        valve repaired at 49   Colon cancer Neg Hx    Esophageal cancer Neg Hx    Stomach cancer Neg Hx    Social History   Socioeconomic History   Marital status: Married    Spouse name: Not on file   Number of children: 0   Years of education: Not on file   Highest education level: Bachelor's degree (e.g., BA, AB, BS)  Occupational History   Occupation: Hydrographic surveyor: [employer]  Tobacco Use   Smoking status: Former    Years: 10    Types: Cigarettes    Quit date: 1981    Years since quitting: 43.2   Smokeless tobacco: Never  Vaping Use   Vaping Use: Never used  Substance and Sexual Activity   Alcohol use: Yes    Alcohol/week: 14.0 standard drinks of alcohol    Types: 14 Glasses of wine per week    Comment: 2 glasses of wine per night   Drug use: No   Sexual activity: Yes  Other Topics Concern   Not on file  Social History Narrative   [employer] grad   Married 13.5 years-  divorced; married '05   No children   Work: Insurance underwriter   Patient is a former smoker. Quit in 1981   Alcohol use- yes social   Daily Caffeine use 2 cups coffee per day   Illicit drug use- no   Right handed   Two story home   Social Determinants of Health   Financial Resource Strain: Low Risk  (12/13/2022)   Overall Financial Resource Strain (CARDIA)    Difficulty of Paying Living Expenses: Not very hard  Food Insecurity: No Food Insecurity (12/13/2022)   Hunger Vital Sign    Worried About Running Out of Food in the Last Year: Never true    Ran Out of Food in the Last Year: Never true  Transportation Needs: No Transportation Needs (12/13/2022)   PRAPARE - Hydrologist (Medical): No    Lack of Transportation (Non-Medical): No  Physical Activity: Insufficiently Active (12/13/2022)   Exercise Vital Sign    Days of Exercise per Week: 2 days    Minutes of Exercise per Session: 30 min  Stress: No Stress Concern Present (12/13/2022)   [place]    Feeling of Stress : Not at all  Social Connections: Unknown (12/13/2022)   Social Connection and Isolation Panel [NHANES]    Frequency of Communication with Friends and Family: Three times a week    Frequency of Social Gatherings with Friends and Family: Once a week    Attends Religious Services: Not on Stage manager or Organizations: Yes    Attends Music therapist: More than 4 times per year    Marital Status: Married    Tobacco Counseling Counseling given: Not Answered   Clinical Intake:  Pre-visit preparation completed: Yes  Pain : No/denies pain Pain Score: 0-No pain     BMI - recorded: 26.31 Nutritional Status: BMI 25 -29 Overweight Nutritional Risks: None Diabetes: No  How often do you need to have someone help you when you read instructions, pamphlets, or other written materials from your doctor or pharmacy?: 1 - Never What is the last grade level you completed in school?: [employer] Graduate  Diabetic? No  Interpreter Needed?: No  Information entered by :: Lisette Abu, LPN.   Activities of Daily Living    12/13/2022    3:49 PM 12/09/2022   10:15 AM  In your present state of health, do you have any difficulty performing the following activities:  Hearing? 0 0  Vision? 0 0  Difficulty concentrating or making decisions? 0 0  Walking or climbing stairs? 0 0  Dressing or bathing? 0 0  Doing errands, shopping? 0 0  Preparing Food and eating ? N N  Using the Toilet? N N  In the past six months, have you accidently leaked urine? N N  Do you have problems with loss of bowel control? N N  Managing your Medications? N N  Managing your Finances? N N  Housekeeping or managing your Housekeeping? N N    Patient Care Team: Plotnikov, Evie Lacks, MD as PCP - General (Internal Medicine) Troy Sine, MD as PCP - Cardiology (Cardiology) Irene Shipper, MD as Consulting Physician (Gastroenterology) Rivka Spring, MD (Urology) Tat, Eustace Quail, DO as Consulting Physician (Neurology) Willow Ora as Consulting Physician (Optometry)  Indicate any recent Medical Services you may have received from other than [facility] providers in the past year (date may be  approximate).     Assessment:   This is a routine wellness examination for  Asha.  Hearing/Vision screen Hearing Screening - Comments:: Denies hearing difficulties   Vision Screening - Comments:: Wears rx glasses - up to date with routine eye exams with Maryruth Hancock Hutto, OD.   Dietary issues and exercise activities discussed: Current Exercise Habits: Home exercise routine, Time (Minutes): 30, Frequency (Times/Week): 2, Weekly Exercise (Minutes/Week): 60, Intensity: Mild, Exercise limited by: neurologic condition(s);orthopedic condition(s)   Goals Addressed   None   Depression Screen    12/13/2022    3:47 PM 11/09/2022    9:16 AM 01/18/2022    1:37 PM 12/09/2021   10:23 AM 12/05/2020    9:30 AM 06/07/2017    1:10 PM 08/09/2016    8:52 AM  PHQ 2/9 Scores  PHQ - 2 Score 0 0 0 0 0 0 0  PHQ- 9 Score 0 0 0        Fall Risk    12/13/2022    3:49 PM 12/09/2022   10:15 AM 11/09/2022    9:16 AM 09/30/2022   10:48 AM 03/30/2022    9:06 AM  [place] in the past year? 0 0 0 0 0  Number falls in past yr: 0 0 0 0 0  Injury with Fall? 0 0 0 0 0  Risk for fall due to : No Fall Risks  No Fall Risks    Follow up Falls prevention discussed  Falls evaluation completed      FALL RISK PREVENTION PERTAINING TO THE HOME:  Any stairs in or around the home? Yes  If so, are there any without handrails? No  Home free of loose throw rugs in walkways, pet beds, electrical cords, etc? Yes  Adequate lighting in your home to reduce risk of falls? Yes   ASSISTIVE DEVICES UTILIZED TO PREVENT FALLS:  Life alert? No  Use of a cane, walker or w/c? No  Grab bars in the bathroom? No  Shower chair or bench in shower? Yes  Elevated toilet seat or a handicapped toilet? No   TIMED UP AND GO:  Was the test performed? No . Telephonic Visit  Cognitive Function:    07/14/2017   11:45 AM  MMSE - Mini Mental State Exam  Not completed: Refused        12/13/2022    3:51 PM  6CIT Screen  What Year? 0 points  What month? 0 points  What time? 0 points  Count back from 20 0 points   Months in reverse 0 points  Repeat phrase 0 points  Total Score 0 points    Immunizations Immunization History  Administered Date(s) Administered   COVID-19, mRNA, vaccine(Comirnaty)12 years and older 06/12/2022   Fluad Quad(high Dose 65+) 05/28/2020   Hepatitis A 03/20/2013   Influenza Whole 06/17/2010, 09/22/2012   Influenza, High Dose Seasonal PF 09/20/2018, 06/30/2022   Influenza,inj,Quad PF,6+ Mos 06/18/2014, 08/09/2016, 09/03/2017   Influenza-Unspecified 09/29/2015, 07/07/2017, 05/28/2020   PFIZER(Purple Top)SARS-COV-2 Vaccination 10/27/2019, 11/21/2019, 07/09/2020   Pneumococcal Conjugate-13 04/04/2017   Pneumococcal Polysaccharide-23 08/15/2008   Tdap 10/04/2012   Yellow Fever 04/13/2013   Zoster Recombinat (Shingrix) 05/10/2022, 08/17/2022   Zoster, Live 05/28/2014    TDAP status: Due, Education has been provided regarding the importance of this vaccine. Advised may receive this vaccine at local pharmacy or Health Dept. Aware to provide a copy of the vaccination record if obtained from local pharmacy or Health Dept. Verbalized acceptance and understanding.  Flu Vaccine status: Up to date  Pneumococcal vaccine status: Due, Education has been provided regarding the importance of this vaccine. Advised may receive this vaccine at local pharmacy or Health Dept. Aware to provide a copy of the vaccination record if obtained from local pharmacy or Health Dept. Verbalized acceptance and understanding.  Covid-19 vaccine status: Completed vaccines  Qualifies for Shingles Vaccine? Yes   Zostavax completed Yes   Shingrix Completed?: Yes  Screening Tests Health Maintenance  Topic Date Due   Hepatitis C Screening  Never done   Pneumonia Vaccine 65+ Years old (3 of 3 - PPSV23 or PCV20) 04/04/2022   DTaP/Tdap/Td (2 - Td or Tdap) 10/04/2022   INFLUENZA VACCINE  04/14/2023   Medicare Annual Wellness (AWV)  12/13/2023   COVID-19 Vaccine  Completed   Zoster Vaccines- Shingrix   Completed   HPV VACCINES  Aged Out   COLONOSCOPY (Pts 45-50yrs Insurance coverage will need to be confirmed)  Discontinued    Health Maintenance  Health Maintenance Due  Topic Date Due   Hepatitis C Screening  Never done   Pneumonia Vaccine 4+ Years old (3 of 3 - PPSV23 or PCV20) 04/04/2022   DTaP/Tdap/Td (2 - Td or Tdap) 10/04/2022    Colorectal cancer screening: No longer required.   Lung Cancer Screening: (Low Dose CT Chest recommended if Age 46-80 years, 30 pack-year currently smoking OR have quit w/in 15years.) does not qualify.   Lung Cancer Screening Referral: no  Additional Screening:  Hepatitis C Screening: does qualify; Completed: No  Vision Screening: Recommended annual ophthalmology exams for early detection of glaucoma and other disorders of the eye. Is the patient up to date with their annual eye exam?  Yes  Who is the provider or what is the name of the office in which the patient attends annual eye exams? Neill Hutto, OD. If pt is not established with a provider, would they like to be referred to a provider to establish care? No .   Dental Screening: Recommended annual dental exams for proper oral hygiene  Community Resource Referral / Chronic Care Management: CRR required this visit?  No   CCM required this visit?  No      Plan:     I have personally reviewed and noted the following in the patient's chart:   Medical and social history Use of alcohol, tobacco or illicit drugs  Current medications and supplements including opioid prescriptions. Patient is not currently taking opioid prescriptions. Functional ability and status Nutritional status Physical activity Advanced directives List of other physicians Hospitalizations, surgeries, and ER visits in previous 12 months Vitals Screenings to include cognitive, depression, and falls Referrals and appointments  In addition, I have reviewed and discussed with patient certain preventive protocols,  quality metrics, and best practice recommendations. A written personalized care plan for preventive services as well as general preventive health recommendations were provided to patient.     Sheral Flow, LPN   [id number]   Nurse Notes:  Normal cognitive status assessed by direct observation by this Nurse Health Advisor. No abnormalities found.   Patient Medicare AWV questionnaire was completed by the patient on 12/09/2022; I have confirmed that all information answered by patient is correct and no changes since this date.     Medical screening examination/treatment/procedure(s) were performed by non-physician practitioner and as supervising physician I was immediately available for consultation/collaboration.  I agree with above. Lew Dawes, MD

## 2022-12-13 NOTE — Patient Instructions (Signed)
William [name] , Thank you for taking time to come for your Medicare Wellness Visit. I appreciate your ongoing commitment to your health goals. Please review the following plan we discussed and let me know if I can assist you in the future.   These are the goals we discussed:  Goals      DIET - INCREASE WATER INTAKE     Continue with my current exercise regimen with walking and doing resistance training 4-5 days a week to control Parkinson's Disease.        This is a list of the screening recommended for you and due dates:  Health Maintenance  Topic Date Due   Hepatitis C Screening: USPSTF Recommendation to screen - Ages 61-79 yo.  Never done   Pneumonia Vaccine (3 of 3 - PPSV23 or PCV20) 04/04/2022   DTaP/Tdap/Td vaccine (2 - Td or Tdap) 10/04/2022   Flu Shot  04/14/2023   Medicare Annual Wellness Visit  12/13/2023   COVID-19 Vaccine  Completed   Zoster (Shingles) Vaccine  Completed   HPV Vaccine  Aged Out   Colon Cancer Screening  Discontinued   Health Maintenance Due  Topic Date Due   Hepatitis C Screening  Never done   Pneumonia Vaccine 46+ Years old (81 of 3 - PPSV23 or PCV20) 04/04/2022   DTaP/Tdap/Td (2 - Td or Tdap) 10/04/2022   Advanced directives: Yes  Conditions/risks identified: Yes  Next appointment: Follow up in one year for your annual wellness visit.   Preventive Care 15 Years and Older, Male  Preventive care refers to lifestyle choices and visits with your health care provider that can promote health and wellness. What does preventive care include? A yearly physical exam. This is also called an annual well check. Dental exams once or twice a year. Routine eye exams. Ask your health care provider how often you should have your eyes checked. Personal lifestyle choices, including: Daily care of your teeth and gums. Regular physical activity. Eating a healthy diet. Avoiding tobacco and drug use. Limiting alcohol use. Practicing safe sex. Taking low doses of  aspirin every day. Taking vitamin and mineral supplements as recommended by your health care provider. What happens during an annual well check? The services and screenings done by your health care provider during your annual well check will depend on your age, overall health, lifestyle risk factors, and family history of disease. Counseling  Your health care provider may ask you questions about your: Alcohol use. Tobacco use. Drug use. Emotional well-being. Home and relationship well-being. Sexual activity. Eating habits. History of falls. Memory and ability to understand (cognition). Work and work Statistician. Screening  You may have the following tests or measurements: Height, weight, and BMI. Blood pressure. Lipid and cholesterol levels. These may be checked every 5 years, or more frequently if you are over 26 years old. Skin check. Lung cancer screening. You may have this screening every year starting at age 29 if you have a 30-pack-year history of smoking and currently smoke or have quit within the past 15 years. Fecal occult blood test (FOBT) of the stool. You may have this test every year starting at age 50. Flexible sigmoidoscopy or colonoscopy. You may have a sigmoidoscopy every 5 years or a colonoscopy every 10 years starting at age 71. Prostate cancer screening. Recommendations will vary depending on your family history and other risks. Hepatitis C blood test. Hepatitis B blood test. Sexually transmitted disease (STD) testing. Diabetes screening. This is done by checking your  blood sugar (glucose) after you have not eaten for a while (fasting). You may have this done every 1-3 years. Abdominal aortic aneurysm (AAA) screening. You may need this if you are a current or former smoker. Osteoporosis. You may be screened starting at age 55 if you are at high risk. Talk with your health care provider about your test results, treatment options, and if necessary, the need for more  tests. Vaccines  Your health care provider may recommend certain vaccines, such as: Influenza vaccine. This is recommended every year. Tetanus, diphtheria, and acellular pertussis (Tdap, Td) vaccine. You may need a Td booster every 10 years. Zoster vaccine. You may need this after age 71. Pneumococcal 13-valent conjugate (PCV13) vaccine. One dose is recommended after age 67. Pneumococcal polysaccharide (PPSV23) vaccine. One dose is recommended after age 49. Talk to your health care provider about which screenings and vaccines you need and how often you need them. This information is not intended to replace advice given to you by your health care provider. Make sure you discuss any questions you have with your health care provider. Document Released: 09/26/2015 Document Revised: 05/19/2016 Document Reviewed: 07/01/2015 Elsevier Interactive Patient Education  2017 [place] Prevention in the Home Falls can cause injuries. They can happen to people of all ages. There are many things you can do to make your home safe and to help prevent falls. What can I do on the outside of my home? Regularly fix the edges of walkways and driveways and fix any cracks. Remove anything that might make you trip as you walk through a door, such as a raised step or threshold. Trim any bushes or trees on the path to your home. Use bright outdoor lighting. Clear any walking paths of anything that might make someone trip, such as rocks or tools. Regularly check to see if handrails are loose or broken. Make sure that both sides of any steps have handrails. Any raised decks and porches should have guardrails on the edges. Have any leaves, snow, or ice cleared regularly. Use sand or salt on walking paths during winter. Clean up any spills in your garage right away. This includes oil or grease spills. What can I do in the bathroom? Use night lights. Install grab bars by the toilet and in the tub and shower.  Do not use towel bars as grab bars. Use non-skid mats or decals in the tub or shower. If you need to sit down in the shower, use a plastic, non-slip stool. Keep the floor dry. Clean up any water that spills on the floor as soon as it happens. Remove soap buildup in the tub or shower regularly. Attach bath mats securely with double-sided non-slip rug tape. Do not have throw rugs and other things on the floor that can make you trip. What can I do in the bedroom? Use night lights. Make sure that you have a light by your bed that is easy to reach. Do not use any sheets or blankets that are too big for your bed. They should not hang down onto the floor. Have a firm chair that has side arms. You can use this for support while you get dressed. Do not have throw rugs and other things on the floor that can make you trip. What can I do in the kitchen? Clean up any spills right away. Avoid walking on wet floors. Keep items that you use a lot in easy-to-reach places. If you need to reach something above  you, use a strong step stool that has a grab bar. Keep electrical cords out of the way. Do not use floor polish or wax that makes floors slippery. If you must use wax, use non-skid floor wax. Do not have throw rugs and other things on the floor that can make you trip. What can I do with my stairs? Do not leave any items on the stairs. Make sure that there are handrails on both sides of the stairs and use them. Fix handrails that are broken or loose. Make sure that handrails are as long as the stairways. Check any carpeting to make sure that it is firmly attached to the stairs. Fix any carpet that is loose or worn. Avoid having throw rugs at the top or bottom of the stairs. If you do have throw rugs, attach them to the floor with carpet tape. Make sure that you have a light switch at the top of the stairs and the bottom of the stairs. If you do not have them, ask someone to add them for you. What else  can I do to help prevent falls? Wear shoes that: Do not have high heels. Have rubber bottoms. Are comfortable and fit you well. Are closed at the toe. Do not wear sandals. If you use a stepladder: Make sure that it is fully opened. Do not climb a closed stepladder. Make sure that both sides of the stepladder are locked into place. Ask someone to hold it for you, if possible. Clearly mark and make sure that you can see: Any grab bars or handrails. First and last steps. Where the edge of each step is. Use tools that help you move around (mobility aids) if they are needed. These include: Canes. Walkers. Scooters. Crutches. Turn on the lights when you go into a dark area. Replace any light bulbs as soon as they burn out. Set up your furniture so you have a clear path. Avoid moving your furniture around. If any of your floors are uneven, fix them. If there are any pets around you, be aware of where they are. Review your medicines with your doctor. Some medicines can make you feel dizzy. This can increase your chance of falling. Ask your doctor what other things that you can do to help prevent falls. This information is not intended to replace advice given to you by your health care provider. Make sure you discuss any questions you have with your health care provider. Document Released: 06/26/2009 Document Revised: 02/05/2016 Document Reviewed: 10/04/2014 Elsevier Interactive Patient Education  2017 [employer].

## 2022-12-21 DIAGNOSIS — L570 Actinic keratosis: Secondary | ICD-10-CM | POA: Diagnosis not present

## 2022-12-21 DIAGNOSIS — X32XXXD Exposure to sunlight, subsequent encounter: Secondary | ICD-10-CM | POA: Diagnosis not present

## 2022-12-23 ENCOUNTER — Telehealth (INDEPENDENT_AMBULATORY_CARE_PROVIDER_SITE_OTHER): Payer: Medicare HMO | Admitting: Nurse Practitioner

## 2022-12-23 DIAGNOSIS — J208 Acute bronchitis due to other specified organisms: Secondary | ICD-10-CM

## 2022-12-23 DIAGNOSIS — B9689 Other specified bacterial agents as the cause of diseases classified elsewhere: Secondary | ICD-10-CM

## 2022-12-23 MED ORDER — BENZONATATE 100 MG PO CAPS: 100.0000 mg | ORAL_CAPSULE | Freq: Three times a day (TID) | ORAL | 0 refills | Status: DC | PRN

## 2022-12-23 MED ORDER — DOXYCYCLINE HYCLATE 100 MG PO TABS: 100.0000 mg | ORAL_TABLET | Freq: Two times a day (BID) | ORAL | 0 refills | Status: DC

## 2022-12-23 NOTE — Progress Notes (Signed)
   Established Patient Office Visit  An audio/visual tele-health visit was completed today for this patient. I connected with  Maylon Peppers on 12/23/22 utilizing audio/visual technology and verified that I am speaking with the correct person using two identifiers. The patient was located at their home, and I was located at the office of [facility] at [facility] during the encounter. I discussed the limitations of evaluation and management by telemedicine. The patient expressed understanding and agreed to proceed.     Subjective   Patient ID: William [name], male    DOB: Jul 16, 1951  Age: 72 y.o. MRN: [id number]  Chief Complaint  Patient presents with   Cough   Symptom onset 6-7 weeks ago, main concern is cough. Denies fever. Has seen 3 other providers over the last 6-7 weeks for this. Has been treated with augmentin, doxycycline, promethazine-DM, tessalon perles. Feels that once he takes an antibiotic symptoms start to improve removed but upon discontinuing symptoms return.  Has Parkinson's at baseline so reports he has chronic fatigue but does not feel more fatigued with current symptoms.  Had brain fog and some confusion when taking Promethazine DM so he discontinued this.  Remote history of smoking, quit in the 1980s.    Review of Systems  Constitutional:  Positive for malaise/fatigue (chronic has Parkinson's and has fatigue at baseline, no worse than baseline). Negative for chills and fever.  HENT:         (+) hoarseness  Respiratory:  Positive for cough. Negative for sputum production, shortness of breath and wheezing.   Cardiovascular:  Negative for chest pain.      Objective:     There were no vitals taken for this visit.   Physical Exam Comprehensive physical exam not completed today as office visit was conducted remotely.  Patient appeared fairly well over video, no respiratory distress noted.  Patient was alert and oriented, and appeared to have appropriate  judgment.   No results found for any visits on 12/23/22.    The ASCVD Risk score (Arnett DK, et al., 2019) failed to calculate for the following reasons:   Cannot find a previous HDL lab   Cannot find a previous total cholesterol lab    Assessment & Plan:   Problem List Items Addressed This Visit       Respiratory   Acute bacterial bronchitis    Has been present for 6 to 7 months.  Will extend course of doxycycline for another 10 days.  Patient told if symptoms continue to return he will need in person evaluation.  Patient reports understanding, also encouraged to seek in person evaluation if symptoms worsen especially if he experiences fever, worsening cough, shortness of breath, increased sputum production before antibiotic is finished.  He reports his understanding.      Relevant Medications   doxycycline (VIBRA-TABS) 100 MG tablet   benzonatate (TESSALON) 100 MG capsule   Offered to schedule in person follow-up in about 2 weeks for close monitoring, patient declined and would prefer to call office if he feels symptoms or not improving.  Return if symptoms worsen or fail to improve.    Elenore Paddy, NP

## 2022-12-23 NOTE — Assessment & Plan Note (Signed)
Has been present for 6 to 7 months.  Will extend course of doxycycline for another 10 days.  Patient told if symptoms continue to return he will need in person evaluation.  Patient reports understanding, also encouraged to seek in person evaluation if symptoms worsen especially if he experiences fever, worsening cough, shortness of breath, increased sputum production before antibiotic is finished.  He reports his understanding.

## 2022-12-24 ENCOUNTER — Telehealth: Payer: Medicare HMO | Admitting: Internal Medicine

## 2023-01-14 ENCOUNTER — Encounter: Payer: Self-pay | Admitting: Internal Medicine

## 2023-01-19 ENCOUNTER — Ambulatory Visit (INDEPENDENT_AMBULATORY_CARE_PROVIDER_SITE_OTHER): Payer: Medicare HMO

## 2023-01-19 ENCOUNTER — Encounter: Payer: Self-pay | Admitting: Internal Medicine

## 2023-01-19 ENCOUNTER — Ambulatory Visit (INDEPENDENT_AMBULATORY_CARE_PROVIDER_SITE_OTHER): Payer: Medicare HMO | Admitting: Internal Medicine

## 2023-01-19 VITALS — BP 118/60 | HR 59 | Temp 98.3°F | Ht 67.0 in | Wt 172.0 lb

## 2023-01-19 DIAGNOSIS — R053 Chronic cough: Secondary | ICD-10-CM | POA: Diagnosis not present

## 2023-01-19 DIAGNOSIS — G500 Trigeminal neuralgia: Secondary | ICD-10-CM

## 2023-01-19 DIAGNOSIS — R059 Cough, unspecified: Secondary | ICD-10-CM | POA: Diagnosis not present

## 2023-01-19 MED ORDER — BENZONATATE 200 MG PO CAPS: 200.0000 mg | ORAL_CAPSULE | Freq: Three times a day (TID) | ORAL | 1 refills | Status: DC | PRN

## 2023-01-19 MED ORDER — METHYLPREDNISOLONE 4 MG PO TBPK
ORAL_TABLET | ORAL | 0 refills | Status: DC
Start: 2023-01-19 — End: 2023-02-21

## 2023-01-19 MED ORDER — HYDROCODONE BIT-HOMATROP MBR 5-1.5 MG/5ML PO SOLN: 5.0000 mL | Freq: Three times a day (TID) | ORAL | 0 refills | Status: DC | PRN

## 2023-01-19 NOTE — Assessment & Plan Note (Signed)
Persistent cough - post-URI CXR Tessalon 200 mg PRN cough  Hycodan prn RTC 4-5 wks

## 2023-01-19 NOTE — Progress Notes (Signed)
Subjective:  Patient ID: William [name], male    DOB: 26-Mar-1951  Age: 72 y.o. MRN: [id number]  CC: No chief complaint on file.   HPI William [name] presents for cough x 2 months No wheezing. No CP C/o green mucus - rare. Pt took abx x2 courses  Outpatient Medications Prior to Visit  Medication Sig Dispense Refill   betamethasone dipropionate 0.05 % cream Apply topically 2 (two) times daily as needed.     carbidopa-levodopa (SINEMET IR) 25-100 MG tablet 2 at 7am/2 at 11am/2 at 4pm 540 tablet 2   ferrous sulfate 325 (65 FE) MG tablet Take 65 mg by mouth in the morning and at bedtime.     psyllium (METAMUCIL) 58.6 % powder Take 1 packet by mouth 2 (two) times daily.     rotigotine (NEUPRO) 6 MG/24HR APPLY 1 PATCH ONTO THE SKIN EVERY DAY 90 patch 1   benzonatate (TESSALON) 100 MG capsule Take 1 capsule (100 mg total) by mouth 3 (three) times daily as needed for cough. 30 capsule 0   doxycycline (VIBRA-TABS) 100 MG tablet Take 1 tablet (100 mg total) by mouth 2 (two) times daily. 20 tablet 0   omeprazole (PRILOSEC) 40 MG capsule Take 1 capsule (40 mg total) by mouth 2 (two) times daily. 180 capsule 3   No facility-administered medications prior to visit.    ROS: Review of Systems  Constitutional:  Negative for appetite change, fatigue and unexpected weight change.  HENT:  Negative for congestion, nosebleeds, sneezing, sore throat and trouble swallowing.   Eyes:  Negative for itching and visual disturbance.  Respiratory:  Positive for cough.   Cardiovascular:  Negative for chest pain, palpitations and leg swelling.  Gastrointestinal:  Negative for abdominal distention, blood in stool, diarrhea and nausea.  Genitourinary:  Negative for frequency and hematuria.  Musculoskeletal:  Negative for back pain, gait problem, joint swelling and neck pain.  Skin:  Negative for rash.  Neurological:  Negative for dizziness, tremors, speech difficulty and weakness.  Psychiatric/Behavioral:   Negative for agitation, dysphoric mood and sleep disturbance. The patient is not nervous/anxious.     Objective:  BP 118/60 (BP Location: Left Arm, Patient Position: Sitting, Cuff Size: Normal)   Pulse (!) 59   Temp 98.3 F (36.8 C) (Oral)   Ht 5\' 7"  (1.702 m)   Wt 172 lb (78 kg)   SpO2 96%   BMI 26.94 kg/m   BP Readings from Last 3 Encounters:  01/19/23 118/60  12/08/22 134/65  12/03/22 120/80    Wt Readings from Last 3 Encounters:  01/19/23 172 lb (78 kg)  12/13/22 168 lb (76.2 kg)  12/03/22 174 lb (78.9 kg)    Physical Exam Constitutional:      General: He is not in acute distress.    Appearance: Normal appearance. He is well-developed.     Comments: NAD  Eyes:     Conjunctiva/sclera: Conjunctivae normal.     Pupils: Pupils are equal, round, and reactive to light.  Neck:     Thyroid: No thyromegaly.     Vascular: No JVD.  Cardiovascular:     Rate and Rhythm: Normal rate and regular rhythm.     Heart sounds: Normal heart sounds. No murmur heard.    No friction rub. No gallop.  Pulmonary:     Effort: Pulmonary effort is normal. No respiratory distress.     Breath sounds: Normal breath sounds. No wheezing or rales.  Chest:     Chest  wall: No tenderness.  Abdominal:     General: Bowel sounds are normal. There is no distension.     Palpations: Abdomen is soft. There is no mass.     Tenderness: There is no abdominal tenderness. There is no guarding or rebound.  Musculoskeletal:        General: No tenderness. Normal range of motion.     Cervical back: Normal range of motion.  Lymphadenopathy:     Cervical: No cervical adenopathy.  Skin:    General: Skin is warm and dry.     Findings: No rash.  Neurological:     Mental Status: He is alert and oriented to person, place, and time.     Cranial Nerves: No cranial nerve deficit.     Motor: No abnormal muscle tone.     Coordination: Coordination normal.     Gait: Gait normal.     Deep Tendon Reflexes: Reflexes are  normal and symmetric.  Psychiatric:        Behavior: Behavior normal.        Thought Content: Thought content normal.        Judgment: Judgment normal.     Lab Results  Component Value Date   WBC 3.7 (L) 09/02/2022   HGB 10.9 (L) 09/16/2022   HCT 32.0 (L) 09/16/2022   PLT 156 09/02/2022   GLUCOSE 112 (H) 09/02/2022   CHOL 157 04/06/2017   TRIG 55.0 04/06/2017   HDL 69.70 04/06/2017   LDLCALC 76 04/06/2017   ALT 15 09/02/2022   AST 22 09/02/2022   NA 143 09/16/2022   K 4.0 09/16/2022   CL 108 09/02/2022   CREATININE 1.25 (H) 09/02/2022   BUN 27 (H) 09/02/2022   CO2 24 09/02/2022   TSH 2.40 11/28/2018   PSA 0.98 04/06/2017    No results found.  Assessment & Plan:   Problem List Items Addressed This Visit     Cough - Primary    Persistent cough - post-URI CXR Tessalon 200 mg PRN cough  Hycodan prn RTC 4-5 wks      Relevant Orders   DG Chest 2 View   Trigeminal neuralgia of left side of face    Resolved after surgery         Meds ordered this encounter  Medications   benzonatate (TESSALON) 200 MG capsule    Sig: Take 1 capsule (200 mg total) by mouth 3 (three) times daily as needed for cough.    Dispense:  60 capsule    Refill:  1   methylPREDNISolone (MEDROL DOSEPAK) 4 MG TBPK tablet    Sig: As directed    Dispense:  21 tablet    Refill:  0   HYDROcodone bit-homatropine (HYCODAN) 5-1.5 MG/5ML syrup    Sig: Take 5 mLs by mouth every 8 (eight) hours as needed for cough.    Dispense:  240 mL    Refill:  0      Follow-up: Return in about 6 weeks (around 03/02/2023) for a follow-up visit.  Sonda Primes, MD

## 2023-01-19 NOTE — Assessment & Plan Note (Signed)
Resolved after surgery

## 2023-02-14 DIAGNOSIS — Z6827 Body mass index (BMI) 27.0-27.9, adult: Secondary | ICD-10-CM | POA: Diagnosis not present

## 2023-02-14 DIAGNOSIS — G500 Trigeminal neuralgia: Secondary | ICD-10-CM | POA: Diagnosis not present

## 2023-02-17 ENCOUNTER — Encounter: Payer: Self-pay | Admitting: Internal Medicine

## 2023-02-17 DIAGNOSIS — R053 Chronic cough: Secondary | ICD-10-CM

## 2023-02-17 NOTE — Telephone Encounter (Signed)
Pt saw MD on 01/19/23.Marland KitchenRaechel Chute

## 2023-02-21 ENCOUNTER — Other Ambulatory Visit: Payer: Self-pay | Admitting: Internal Medicine

## 2023-02-21 MED ORDER — PROMETHAZINE-DM 6.25-15 MG/5ML PO SYRP: 5.0000 mL | ORAL_SOLUTION | Freq: Four times a day (QID) | ORAL | 0 refills | Status: DC | PRN

## 2023-02-21 NOTE — Addendum Note (Signed)
Addended by: Deatra Guerry on: 02/21/2023 08:55 AM   Modules accepted: Orders

## 2023-02-21 NOTE — Progress Notes (Cosign Needed Addendum)
Transmission to pharmacy failed resend to pharmacy  Medical screening examination/treatment/procedure(s) were performed by non-physician practitioner and as supervising physician I was immediately available for consultation/collaboration.  I agree with above. Jacinta Shoe, MD

## 2023-02-23 ENCOUNTER — Other Ambulatory Visit: Payer: Self-pay | Admitting: Nurse Practitioner

## 2023-02-23 NOTE — Telephone Encounter (Signed)
Please advise 

## 2023-02-24 NOTE — Telephone Encounter (Signed)
No referral has been placed../l,mb

## 2023-02-25 ENCOUNTER — Other Ambulatory Visit: Payer: Self-pay | Admitting: Internal Medicine

## 2023-02-25 DIAGNOSIS — R053 Chronic cough: Secondary | ICD-10-CM

## 2023-02-28 DIAGNOSIS — Z125 Encounter for screening for malignant neoplasm of prostate: Secondary | ICD-10-CM | POA: Diagnosis not present

## 2023-02-28 DIAGNOSIS — Z87442 Personal history of urinary calculi: Secondary | ICD-10-CM | POA: Diagnosis not present

## 2023-02-28 DIAGNOSIS — N5201 Erectile dysfunction due to arterial insufficiency: Secondary | ICD-10-CM | POA: Diagnosis not present

## 2023-03-01 DIAGNOSIS — K219 Gastro-esophageal reflux disease without esophagitis: Secondary | ICD-10-CM | POA: Diagnosis not present

## 2023-03-01 DIAGNOSIS — Z809 Family history of malignant neoplasm, unspecified: Secondary | ICD-10-CM | POA: Diagnosis not present

## 2023-03-01 DIAGNOSIS — Z8616 Personal history of COVID-19: Secondary | ICD-10-CM | POA: Diagnosis not present

## 2023-03-01 DIAGNOSIS — M4800 Spinal stenosis, site unspecified: Secondary | ICD-10-CM | POA: Diagnosis not present

## 2023-03-01 DIAGNOSIS — Z008 Encounter for other general examination: Secondary | ICD-10-CM | POA: Diagnosis not present

## 2023-03-01 DIAGNOSIS — G20A1 Parkinson's disease without dyskinesia, without mention of fluctuations: Secondary | ICD-10-CM | POA: Diagnosis not present

## 2023-03-01 DIAGNOSIS — N189 Chronic kidney disease, unspecified: Secondary | ICD-10-CM | POA: Diagnosis not present

## 2023-03-01 DIAGNOSIS — Z87891 Personal history of nicotine dependence: Secondary | ICD-10-CM | POA: Diagnosis not present

## 2023-03-01 DIAGNOSIS — I739 Peripheral vascular disease, unspecified: Secondary | ICD-10-CM | POA: Diagnosis not present

## 2023-03-01 DIAGNOSIS — I129 Hypertensive chronic kidney disease with stage 1 through stage 4 chronic kidney disease, or unspecified chronic kidney disease: Secondary | ICD-10-CM | POA: Diagnosis not present

## 2023-03-01 DIAGNOSIS — N529 Male erectile dysfunction, unspecified: Secondary | ICD-10-CM | POA: Diagnosis not present

## 2023-03-09 ENCOUNTER — Encounter: Payer: Self-pay | Admitting: Pulmonary Disease

## 2023-03-09 ENCOUNTER — Ambulatory Visit (INDEPENDENT_AMBULATORY_CARE_PROVIDER_SITE_OTHER): Payer: Medicare HMO | Admitting: Pulmonary Disease

## 2023-03-09 VITALS — BP 112/70 | HR 77 | Temp 97.3°F | Ht 67.0 in | Wt 175.0 lb

## 2023-03-09 DIAGNOSIS — R053 Chronic cough: Secondary | ICD-10-CM | POA: Diagnosis not present

## 2023-03-09 MED ORDER — BUDESONIDE-FORMOTEROL FUMARATE 160-4.5 MCG/ACT IN AERO
2.0000 | INHALATION_SPRAY | Freq: Two times a day (BID) | RESPIRATORY_TRACT | 3 refills | Status: DC
Start: 2023-03-09 — End: 2023-12-01

## 2023-03-09 NOTE — Progress Notes (Signed)
@Patient  ID: William [name], male    DOB: Apr 01, 1951, 72 y.o.   MRN: [id number]  Chief Complaint  Patient presents with   Consult    Chronic cough light yellowish green     Referring provider: Plotnikov, Georgina Quint, MD  HPI:   72 y.o. man whom we are seeing for evaluation of chronic cough.  Multiple PCP note reviewed.  Multiple GI notes reviewed.  Patient was in usual state of health.  Describes likely viral URI 11/2022.  Overall symptoms largely improved but cough has persisted.  Worsening readings when he lies down.  Hocks up green phlegm.  Also in the morning.  Can occur during the day as well but not as frequent or severe.  He endorses some nasal congestion and postnasal drip.  He uses a sinus lavage.  No significant rhinorrhea.  He carries a diagnosis of GERD but denies reflux symptoms.  No heartburn.  On PPI therapy.  Reviewed chest x-ray 01/2023 in the midst of the symptoms, interpreted as clear lungs bilaterally.  He has a significant dyspnea.  No fever or chills.  He occasionally feels a sensation of a spasm in his chest.  May be from the diaphragm he says.  Think it has been a tightness goes away.  Questionaires / Pulmonary Flowsheets:   ACT:      No data to display          MMRC:     No data to display          Epworth:      No data to display          Tests:   FENO:  No results found for: "NITRICOXIDE"  PFT:     No data to display          WALK:      No data to display          Imaging: Personally reviewed and as per EMR and discussion in this note No results found.  Lab Results: Personally reviewed CBC    Component Value Date/Time   WBC 3.7 (L) 09/02/2022 0935   RBC 3.86 (L) 09/02/2022 0935   HGB 10.9 (L) 09/16/2022 1148   HCT 32.0 (L) 09/16/2022 1148   PLT 156 09/02/2022 0935   MCV 96.6 09/02/2022 0935   MCH 34.7 (H) 09/02/2022 0935   MCHC 35.9 09/02/2022 0935   RDW 13.1 09/02/2022 0935   LYMPHSABS 513 (L) 11/28/2018  0901   MONOABS 0.3 05/28/2017 0231   EOSABS 110 11/28/2018 0901   BASOSABS 42 11/28/2018 0901    BMET    Component Value Date/Time   NA 143 09/16/2022 1148   K 4.0 09/16/2022 1148   CL 108 09/02/2022 0935   CO2 24 09/02/2022 0935   GLUCOSE 112 (H) 09/02/2022 0935   GLUCOSE 108 (H) 08/17/2006 0744   BUN 27 (H) 09/02/2022 0935   CREATININE 1.25 (H) 09/02/2022 0935   CREATININE 1.07 06/27/2018 0915   CALCIUM 9.2 09/02/2022 0935   GFRNONAA >60 09/02/2022 0935   GFRAA >60 05/28/2017 0231    BNP No results found for: "BNP"  ProBNP No results found for: "PROBNP"  Specialty Problems       Pulmonary Problems   Allergic rhinitis    Qualifier: Diagnosis of  By: Debby Bud MD, Rosalyn Gess       HIATAL HERNIA    Qualifier: Diagnosis of  By: Alesia Richards   Replacing diagnoses that were inactivated after the 12/13/22  regulatory import      Viral URI   Cough    5/24 Persistent cough - post-URI in 10/2022 CXR Tessalon 200 mg PRN cough  Hycodan prn RTC 4-5 wks      Acute bacterial bronchitis    No Known Allergies  Immunization History  Administered Date(s) Administered   COVID-19, mRNA, vaccine(Comirnaty)12 years and older 06/12/2022   Fluad Quad(high Dose 65+) 05/28/2020   Hepatitis A 03/20/2013   Influenza Whole 06/17/2010, 09/22/2012   Influenza, High Dose Seasonal PF 09/20/2018, 06/30/2022   Influenza,inj,Quad PF,6+ Mos 06/18/2014, 08/09/2016, 09/03/2017   Influenza-Unspecified 09/29/2015, 07/07/2017, 05/28/2020   PFIZER(Purple Top)SARS-COV-2 Vaccination 10/27/2019, 11/21/2019, 07/09/2020   Pneumococcal Conjugate-13 04/04/2017   Pneumococcal Polysaccharide-23 08/15/2008   Tdap 10/04/2012   Yellow Fever 04/13/2013   Zoster Recombinat (Shingrix) 05/10/2022, 08/17/2022   Zoster, Live 05/28/2014    Past Medical History:  Diagnosis Date   Allergic rhinitis    Anemia, iron deficiency    Arthritis    BPH associated with nocturia    ED (erectile  dysfunction)    GERD (gastroesophageal reflux disease)    Hiatal hernia    History of COVID-19 11/2020   per pt moderate symptoms that resovled   History of hypertension    followed by pcp  (per pt resolved no medication since approx 2017)   History of kidney stones    Left ureteral calculus    Parkinson disease    neurologist--- dr tat;   mild jaw dyskinesia,   gait disorder, tremors   Peyronie's disease    PONV (postoperative nausea and vomiting)    S/P dilatation of esophageal stricture    Trigeminal neuropathy    left side   Ulcerative colitis    followed by dr Marina Goodell (GI)//   1992 s/p  total abdominal colectomy with ileoanal anastomosis   Wears glasses     Tobacco History: Social History   Tobacco Use  Smoking Status Former   Years: 10   Types: Cigarettes   Quit date: 1981   Years since quitting: 43.5  Smokeless Tobacco Never   Counseling given: Not Answered   Continue to not smoke  Outpatient Encounter Medications as of 03/09/2023  Medication Sig   betamethasone dipropionate 0.05 % cream Apply topically 2 (two) times daily as needed.   budesonide-formoterol (SYMBICORT) 160-4.5 MCG/ACT inhaler Inhale 2 puffs into the lungs 2 (two) times daily.   carbidopa-levodopa (SINEMET IR) 25-100 MG tablet 2 at 7am/2 at 11am/2 at 4pm   ferrous sulfate 325 (65 FE) MG tablet Take 65 mg by mouth in the morning and at bedtime.   omeprazole (PRILOSEC) 40 MG capsule TAKE 1 CAPSULE BY MOUTH TWICE A DAY   promethazine-dextromethorphan (PROMETHAZINE-DM) 6.25-15 MG/5ML syrup Take 5 mLs by mouth 4 (four) times daily as needed for cough.   psyllium (METAMUCIL) 58.6 % powder Take 1 packet by mouth 2 (two) times daily.   rotigotine (NEUPRO) 6 MG/24HR APPLY 1 PATCH ONTO THE SKIN EVERY DAY   No facility-administered encounter medications on file as of 03/09/2023.     Review of Systems  Review of Systems  No chest pain with exertion.  No orthopnea or PND.  Comprehensive review of systems  otherwise negative.  Physical Exam  BP 112/70 (BP Location: Left Arm, Patient Position: Sitting, Cuff Size: Normal)   Pulse 77   Temp (!) 97.3 F (36.3 C) (Temporal)   Ht 5\' 7"  (1.702 m)   Wt 175 lb (79.4 kg)   SpO2 95%  BMI 27.41 kg/m   Wt Readings from Last 5 Encounters:  03/09/23 175 lb (79.4 kg)  01/19/23 172 lb (78 kg)  12/13/22 168 lb (76.2 kg)  12/03/22 174 lb (78.9 kg)  11/09/22 175 lb (79.4 kg)    BMI Readings from Last 5 Encounters:  03/09/23 27.41 kg/m  01/19/23 26.94 kg/m  12/13/22 26.31 kg/m  12/03/22 27.25 kg/m  11/09/22 27.41 kg/m     Physical Exam  General: Sitting in chair, no acute distress Eyes: EOMI, no icterus Neck: Supple, no JVP appreciated Cardiovascular: Warm, no edema Pulmonary: Faint end expiratory wheeze in the bases, otherwise clear MSK: No synovitis, no joint effusion Abdomen: Nondistended, sounds present Neuro: Frequent resting tremor movements a bit slowed, sensation appears intact Psych: Normal mood, full affect  Assessment & Plan:   Chronic cough: Following upper respiratory illness, likely viral.  Wheeze on exam.  High suspicion for viral induced given chronicity of symptoms.  Postnasal drip also considered as he endorses postnasal drip.  Trial Symbicort 2 puff twice daily.  Avoiding DPI given chief complaint of cough.  We may be forced via insurance to pursue this in the future.  Assess response.  Chest imaging clear 01/2023.  GERD: Well-controlled.  On PPI.  Productive cough argues against GERD.  Could consider escalation of therapies in the future if inhalers and escalating intranasal regimen are not successful in the future.  He has a hiatal hernia so would be very difficult to prevent reflux.   Return in about 3 months (around 06/09/2023).   Karren Burly, MD 03/09/2023   This appointment required 60 minutes of patient care (this includes precharting, chart review, review of results, face-to-face care,  etc.).

## 2023-03-09 NOTE — Patient Instructions (Signed)
Nice to meet you  Use Symbicort 2 puffs in the morning and 2 puffs in the evening.  Rinse your mouth out thoroughly and spit after each use.  It appears that Advair discus may be preferred and a bit cheaper.  We will see.  The disadvantage of the Advair in my mind is that it is a dry powder inhaler and sometimes this makes cough worse.  But, if the Symbicort is too expensive please let me know and I will look for an alternative.  Return to clinic in 3 months or as needed with Dr. Judeth Horn

## 2023-03-29 NOTE — Progress Notes (Signed)
Assessment/Plan:   1.  Parkinsons Disease  -continue carbidopa/levodopa 25/100, 2 tablets at 7 AM, 2 tablets at 11 AM, 2 tablets at 4 PM.  -He does get some skin reaction with the rotigotine.  We talked about switching to ropinirole, but ultimately he really would like to stay with rotigotine.  He is already using the Flonase prior to applying the patch.   -Continue neupro 6 mg daily.    2.  dyskinesia  -doesn't want amantadine 3.  MCI  -Last neurocognitive testing in 2019.  I do not think anything clinically has significantly changed since that time.  4.  Trigeminal neuralgia  -Patient underwent left microvascular decompression of the trigeminal nerve on September 16, 2022 with Dr. Jake Samples  -Patient with hyponatremia with carbamazepine  5.  LBP  -Follows with [facility] Subjective:   William [name] was seen today in follow up for Parkinsons disease.  My previous records were reviewed prior to todays visit as well as outside records available to me.  Patient is with his wife who supplements the history.  Patient remains on levodopa and rotigotine.  He still gets rash with the patch but doesn't want to change to ropinirole.  He has seen derm for this as well.  He has had dyskinesia.  He has not had hallucinations.  He has been following with pulmonary and primary care for chronic cough.  He was started on Symbicort by pulmonary for this on June 26.  This is helping.  He followed up with Dr. Jake Samples June 3 regarding his prior trigeminal decompression surgery and continues to be happy with the outcome of that.  He is doing RSB and likes that.    Current prescribed movement disorder medications: Carbidopa/levodopa 25/100,2 tablets at 7 AM, 2 tablets at 11 AM, 2 tablets at 4 PM.   Rotigotine patch, 6 mg daily   Prior meds:  pramipexole er (switched due to a near syncopal episode after drink of alcohol); Lyrica (tolerated fine but when off when had neuralgia surgery); carbamazepine  (hyponatremia)   ALLERGIES:  No Known Allergies  CURRENT MEDICATIONS:  Outpatient Encounter Medications as of 03/31/2023  Medication Sig   betamethasone dipropionate 0.05 % cream Apply topically 2 (two) times daily as needed.   budesonide-formoterol (SYMBICORT) 160-4.5 MCG/ACT inhaler Inhale 2 puffs into the lungs 2 (two) times daily.   carbidopa-levodopa (SINEMET IR) 25-100 MG tablet 2 at 7am/2 at 11am/2 at 4pm   ferrous sulfate 325 (65 FE) MG tablet Take 65 mg by mouth in the morning and at bedtime.   omeprazole (PRILOSEC) 40 MG capsule TAKE 1 CAPSULE BY MOUTH TWICE A DAY   psyllium (METAMUCIL) 58.6 % powder Take 1 packet by mouth 2 (two) times daily.   rotigotine (NEUPRO) 6 MG/24HR APPLY 1 PATCH ONTO THE SKIN EVERY DAY   [DISCONTINUED] promethazine-dextromethorphan (PROMETHAZINE-DM) 6.25-15 MG/5ML syrup Take 5 mLs by mouth 4 (four) times daily as needed for cough.   No facility-administered encounter medications on file as of 03/31/2023.    Objective:   PHYSICAL EXAMINATION:    VITALS:   Vitals:   03/31/23 0859  BP: (!) 144/90  Pulse: 62  SpO2: 99%  Weight: 174 lb 9.6 oz (79.2 kg)  Height: 5\' 7"  (1.702 m)     GEN:  The patient appears stated age and is in NAD. HEENT:  Normocephalic, AT.   CV:  RRR  Lungs :  ctab   Neurological examination:  Orientation: The patient is alert and oriented  x3. Cranial nerves: There is good facial symmetry with mild facial hypomimia. The speech is fluent and clear. Soft palate rises symmetrically and there is no tongue deviation. Hearing is intact to conversational tone. Sensation: Sensation is intact to light touch throughout Motor: Strength is at least antigravity x4.  Movement examination: Tone: There is nl tone in the UE/LE Abnormal movements: mild tremor in the RUE Coordination:  There is mild decremation with finger taps on the left only Gait and Station: The patient ambulates well (actually shows me video of him shag dancing)     I have reviewed and interpreted the following labs independently    Chemistry      Component Value Date/Time   NA 143 09/16/2022 1148   K 4.0 09/16/2022 1148   CL 108 09/02/2022 0935   CO2 24 09/02/2022 0935   BUN 27 (H) 09/02/2022 0935   CREATININE 1.25 (H) 09/02/2022 0935   CREATININE 1.07 06/27/2018 0915      Component Value Date/Time   CALCIUM 9.2 09/02/2022 0935   ALKPHOS 39 09/02/2022 0935   AST 22 09/02/2022 0935   ALT 15 09/02/2022 0935   BILITOT 0.9 09/02/2022 0935       Lab Results  Component Value Date   WBC 3.7 (L) 09/02/2022   HGB 10.9 (L) 09/16/2022   HCT 32.0 (L) 09/16/2022   MCV 96.6 09/02/2022   PLT 156 09/02/2022    Lab Results  Component Value Date   TSH 2.40 11/28/2018       Cc:  Plotnikov, Georgina Quint, MD

## 2023-03-31 ENCOUNTER — Ambulatory Visit: Payer: Medicare HMO | Admitting: Neurology

## 2023-03-31 VITALS — BP 144/90 | HR 62 | Ht 67.0 in | Wt 174.6 lb

## 2023-03-31 DIAGNOSIS — G20B1 Parkinson's disease with dyskinesia, without mention of fluctuations: Secondary | ICD-10-CM | POA: Diagnosis not present

## 2023-03-31 NOTE — Patient Instructions (Signed)
SAVE THE DATE!  We are planning a Parkinsons Disease educational symposium at [facility] in [place] on October 11.  More details to come!  If you would like to be added to our email list to get further information, email [email or web address].  We hope to see you there!  The physicians and staff at [facility] are committed to providing excellent care. You may receive a survey requesting feedback about your experience at our office. We strive to receive "very good" responses to the survey questions. If you feel that your experience would prevent you from giving the office a "very good " response, please contact our office to try to remedy the situation. We may be reached at [phone]. Thank you for taking the time out of your busy day to complete the survey.

## 2023-04-07 ENCOUNTER — Encounter: Payer: Self-pay | Admitting: Neurology

## 2023-04-13 NOTE — Progress Notes (Signed)
Virtual Visit Via Video       Consent was obtained for video visit:  Yes.   Answered questions that patient had about telehealth interaction:  Yes.   I discussed the limitations, risks, security and privacy concerns of performing an evaluation and management service by telemedicine. I also discussed with the patient that there may be a patient responsible charge related to this service. The patient expressed understanding and agreed to proceed.  Pt location: Home Physician Location: office Name of referring provider:  Plotnikov, Georgina Quint, MD I connected with Maylon Peppers at patients initiation/request on 04/15/2023 at  8:15 AM EDT by video enabled telemedicine application and verified that I am speaking with the correct person using two identifiers. Pt MRN:  [id number] Pt DOB:  Feb 22, 1951 Video Participants:  Maylon Peppers;    Assessment/Plan:   1.  Parkinsons Disease  -continue carbidopa/levodopa 25/100, 2 tablets at 7 AM, 2 tablets at 11 AM, 2 tablets at 4 PM.  -Stop rotigotine, 6 mg, due to skin reaction  -Start ropinirole, 2 mg, 1 tablet at 7 AM and half tablet at 11 AM and 4 PM for 1 week and then 1 tablet at 7 AM/11 AM/4 PM.  This is a fast titration, but since we are just switching 1 dopamine at diagnosis to another, we should be able to switch fairly quickly.  He will let me know if he has any side effects.  Discussed r/b/se   2.  dyskinesia  -doesn't want amantadine 3.  MCI  -Last neurocognitive testing in 2019.  I do not think anything clinically has significantly changed since that time.  4.  Trigeminal neuralgia  -Patient underwent left microvascular decompression of the trigeminal nerve on September 16, 2022 with Dr. Jake Samples  -Patient with hyponatremia with carbamazepine  5.  LBP  -Follows with [facility] Subjective:   William [name] was seen today in follow up for Parkinsons disease.  My previous records were reviewed prior to todays visit as well as outside  records available to me.  I saw patient just a few weeks ago.  He was having a reaction on his scan with rotigotine.  At that point in time, I offered to change his medication, but he decided to hold at that point in time, but ultimately decided to change his mind.  In addition, the cost of the patch is very expensive.   He presents today to discuss that.  Current prescribed movement disorder medications: Carbidopa/levodopa 25/100,2 tablets at 7 AM, 2 tablets at 11 AM, 2 tablets at 4 PM.   Rotigotine patch, 6 mg daily   Prior meds:  pramipexole er (switched due to a near syncopal episode after drink of alcohol); Lyrica (tolerated fine but when off when had neuralgia surgery); carbamazepine (hyponatremia)   ALLERGIES:  No Known Allergies  CURRENT MEDICATIONS:  Outpatient Encounter Medications as of 04/15/2023  Medication Sig   betamethasone dipropionate 0.05 % cream Apply topically 2 (two) times daily as needed.   budesonide-formoterol (SYMBICORT) 160-4.5 MCG/ACT inhaler Inhale 2 puffs into the lungs 2 (two) times daily.   carbidopa-levodopa (SINEMET IR) 25-100 MG tablet 2 at 7am/2 at 11am/2 at 4pm   ferrous sulfate 325 (65 FE) MG tablet Take 65 mg by mouth in the morning and at bedtime.   omeprazole (PRILOSEC) 40 MG capsule TAKE 1 CAPSULE BY MOUTH TWICE A DAY   psyllium (METAMUCIL) 58.6 % powder Take 1 packet by mouth 2 (two) times daily.   rotigotine (  NEUPRO) 6 MG/24HR APPLY 1 PATCH ONTO THE SKIN EVERY DAY   No facility-administered encounter medications on file as of 04/15/2023.    Objective:   PHYSICAL EXAMINATION:    VITALS:   There were no vitals filed for this visit.  GEN:  The patient appears stated age and is in NAD. HEENT:  Normocephalic, AT.     Neurological examination:  Orientation: The patient is alert and oriented x3. Cranial nerves: There is good facial symmetry with mild facial hypomimia. The speech is fluent and clear.  Motor: Strength is at least antigravity  x4.  Movement examination: Abnormal movements: no tremor seen today.  Pts hands not seen at rest today Coordination:  There is no decremation today with hand opening or finger taps    I have reviewed and interpreted the following labs independently    Chemistry      Component Value Date/Time   NA 143 09/16/2022 1148   K 4.0 09/16/2022 1148   CL 108 09/02/2022 0935   CO2 24 09/02/2022 0935   BUN 27 (H) 09/02/2022 0935   CREATININE 1.25 (H) 09/02/2022 0935   CREATININE 1.07 06/27/2018 0915      Component Value Date/Time   CALCIUM 9.2 09/02/2022 0935   ALKPHOS 39 09/02/2022 0935   AST 22 09/02/2022 0935   ALT 15 09/02/2022 0935   BILITOT 0.9 09/02/2022 0935       Lab Results  Component Value Date   WBC 3.7 (L) 09/02/2022   HGB 10.9 (L) 09/16/2022   HCT 32.0 (L) 09/16/2022   MCV 96.6 09/02/2022   PLT 156 09/02/2022    Lab Results  Component Value Date   TSH 2.40 11/28/2018   Follow up Instructions      -I discussed the assessment and treatment plan with the patient. The patient was provided an opportunity to ask questions and all were answered. The patient agreed with the plan and demonstrated an understanding of the instructions.   The patient was advised to call back or seek an in-person evaluation if the symptoms worsen or if the condition fails to improve as anticipated.     Kerin Salen, DO     Cc:  Plotnikov, Georgina Quint, MD

## 2023-04-13 NOTE — Patient Instructions (Signed)
Stop rotigotine patch, 6 mg Start ropinirole, 2 mg, 1 tablet at 7 AM and half tablet at 11 AM and 4 PM for 1 week and then 1 tablet at 7 AM/11 AM/4 PM

## 2023-04-15 ENCOUNTER — Telehealth: Payer: Self-pay

## 2023-04-15 ENCOUNTER — Telehealth: Payer: Medicare HMO | Admitting: Neurology

## 2023-04-15 ENCOUNTER — Encounter: Payer: Self-pay | Admitting: Neurology

## 2023-04-15 DIAGNOSIS — G20B1 Parkinson's disease with dyskinesia, without mention of fluctuations: Secondary | ICD-10-CM | POA: Diagnosis not present

## 2023-04-15 MED ORDER — ROPINIROLE HCL 2 MG PO TABS: ORAL_TABLET | ORAL | 1 refills | Status: DC

## 2023-04-15 NOTE — Telephone Encounter (Signed)
ERRORT

## 2023-04-22 DIAGNOSIS — H0100B Unspecified blepharitis left eye, upper and lower eyelids: Secondary | ICD-10-CM | POA: Diagnosis not present

## 2023-04-22 DIAGNOSIS — H00022 Hordeolum internum right lower eyelid: Secondary | ICD-10-CM | POA: Diagnosis not present

## 2023-04-22 DIAGNOSIS — H0100A Unspecified blepharitis right eye, upper and lower eyelids: Secondary | ICD-10-CM | POA: Diagnosis not present

## 2023-05-13 ENCOUNTER — Encounter: Payer: Self-pay | Admitting: Neurology

## 2023-05-17 DIAGNOSIS — Z4889 Encounter for other specified surgical aftercare: Secondary | ICD-10-CM | POA: Diagnosis not present

## 2023-05-18 NOTE — Patient Instructions (Addendum)
Week 1 Take ropinirole 2 mg at 7 AM and 11 AM and 3 mg at 4 PM Week 2 Take ropinirole 2 mg at 7 AM and 3 mg at 11 AM and 4 PM Week 3 Take ropinirole 3 mg at 7 AM and 11 AM and 4 PM  SAVE THE DATE!  We are planning a Parkinsons Disease educational symposium at [facility] in [place] on October 11.  More details to come!  If you would like to be added to our email list to get further information, email [email or web address].  To sign up, you can email [email or web address].  We hope to see you there!

## 2023-05-18 NOTE — Progress Notes (Signed)
Assessment/Plan:   1.  Parkinsons Disease  -continue carbidopa/levodopa 25/100, 2 tablets at 7 AM, 2 tablets at 11 AM, 2 tablets at 4 PM.  -We will just slightly increase his ropinirole so that he takes a total of 3 mg 3 times per day.  This will be increased to this dosage over the next 3 weeks.  Titration schedule written out.  I think that he is likely experiencing off sx's but he will let me know if sx's worse as we increase the medication and we will then know it was a med SE.    2.  dyskinesia  -doesn't want amantadine 3.  MCI  -Last neurocognitive testing in 2019.  I do not think anything clinically has significantly changed since that time.  4.  Trigeminal neuralgia  -Patient underwent left microvascular decompression of the trigeminal nerve on September 16, 2022 with Dr. Jake Samples  -Patient with hyponatremia with carbamazepine  5.  LBP  -Follows with [facility] Subjective:   William [name] was seen today in follow up for Parkinsons disease.  My previous records were reviewed prior to todays visit as well as outside records available to me.  Patient has been seen multiple times recently.  I saw him through the video on August 2, at which point he wanted to change from the rotigotine patch because of rash and cost.  We switched him to ropinirole.  He finds the medication useful, but has tremors throughout the day and other times he finds that he just "hits the wall."  He will have body aches and drowsiness when he "hits the wall" but can't identify if its wearing off or a SE of the med.  He hasn't had it the last 2 days.  He was recently at dinner with family and thought he wouldn't make it because of "total body fatigue" and then it cleared up.    Current prescribed movement disorder medications: Carbidopa/levodopa 25/100,2 tablets at 7 AM, 2 tablets at 11 AM, 2 tablets at 4 PM.   Ropinirole, 2 mg, 1 at 7 AM, 11 AM, 4 PM  Prior meds:  pramipexole er (switched due to a near syncopal  episode after drink of alcohol); Lyrica (tolerated fine but when off when had neuralgia surgery); carbamazepine (hyponatremia); rotigotine (helped, but caused rash and was expensive)   ALLERGIES:  No Known Allergies  CURRENT MEDICATIONS:  Outpatient Encounter Medications as of 05/20/2023  Medication Sig   betamethasone dipropionate 0.05 % cream Apply topically 2 (two) times daily as needed.   budesonide-formoterol (SYMBICORT) 160-4.5 MCG/ACT inhaler Inhale 2 puffs into the lungs 2 (two) times daily.   carbidopa-levodopa (SINEMET IR) 25-100 MG tablet 2 at 7am/2 at 11am/2 at 4pm   ferrous sulfate 325 (65 FE) MG tablet Take 65 mg by mouth in the morning and at bedtime.   omeprazole (PRILOSEC) 40 MG capsule TAKE 1 CAPSULE BY MOUTH TWICE A DAY   psyllium (METAMUCIL) 58.6 % powder Take 1 packet by mouth 2 (two) times daily.   rOPINIRole (REQUIP) 3 MG tablet Take 1 tablet (3 mg total) by mouth 3 (three) times daily.   [DISCONTINUED] rOPINIRole (REQUIP) 2 MG tablet 1 at 7am, 1/2 tab at 11am/4pm x 1 week, then 1 at 7am/11am/4pm (Patient taking differently: Take 1 tablet at 7am/11am/4pm)   No facility-administered encounter medications on file as of 05/20/2023.    Objective:   PHYSICAL EXAMINATION:    VITALS:   Vitals:   05/20/23 0828  BP: 116/65  Pulse: 77  SpO2: 93%  Weight: 175 lb (79.4 kg)  Height: 5\' 7"  (1.702 m)    GEN:  The patient appears stated age and is in NAD. HEENT:  Normocephalic, AT.   CV:  RRR  Lungs :  ctab   Neurological examination:  Orientation: The patient is alert and oriented x3. Cranial nerves: There is good facial symmetry with mild facial hypomimia. The speech is fluent and clear. Soft palate rises symmetrically and there is no tongue deviation. Hearing is intact to conversational tone. Sensation: Sensation is intact to light touch throughout Motor: Strength is at least antigravity x4.  Movement examination: Tone: There is nl tone in the UE/LE Abnormal  movements: mild dyskinesia in the R shoulder.  No tremor today Coordination:  There is no decremation today Gait and Station: The patient ambulates well out of the office    I have reviewed and interpreted the following labs independently    Chemistry      Component Value Date/Time   NA 143 09/16/2022 1148   K 4.0 09/16/2022 1148   CL 108 09/02/2022 0935   CO2 24 09/02/2022 0935   BUN 27 (H) 09/02/2022 0935   CREATININE 1.25 (H) 09/02/2022 0935   CREATININE 1.07 06/27/2018 0915      Component Value Date/Time   CALCIUM 9.2 09/02/2022 0935   ALKPHOS 39 09/02/2022 0935   AST 22 09/02/2022 0935   ALT 15 09/02/2022 0935   BILITOT 0.9 09/02/2022 0935       Lab Results  Component Value Date   WBC 3.7 (L) 09/02/2022   HGB 10.9 (L) 09/16/2022   HCT 32.0 (L) 09/16/2022   MCV 96.6 09/02/2022   PLT 156 09/02/2022    Lab Results  Component Value Date   TSH 2.40 11/28/2018       Cc:  Plotnikov, Georgina Quint, MD

## 2023-05-20 ENCOUNTER — Encounter: Payer: Self-pay | Admitting: Neurology

## 2023-05-20 ENCOUNTER — Ambulatory Visit: Payer: Medicare HMO | Admitting: Neurology

## 2023-05-20 VITALS — BP 116/65 | HR 77 | Ht 67.0 in | Wt 175.0 lb

## 2023-05-20 DIAGNOSIS — G20B1 Parkinson's disease with dyskinesia, without mention of fluctuations: Secondary | ICD-10-CM

## 2023-05-20 MED ORDER — ROPINIROLE HCL 3 MG PO TABS: 3.0000 mg | ORAL_TABLET | Freq: Three times a day (TID) | ORAL | 1 refills | Status: DC

## 2023-05-26 DIAGNOSIS — R8279 Other abnormal findings on microbiological examination of urine: Secondary | ICD-10-CM | POA: Diagnosis not present

## 2023-05-26 DIAGNOSIS — I861 Scrotal varices: Secondary | ICD-10-CM | POA: Diagnosis not present

## 2023-06-06 DIAGNOSIS — M5451 Vertebrogenic low back pain: Secondary | ICD-10-CM | POA: Diagnosis not present

## 2023-06-08 ENCOUNTER — Ambulatory Visit: Payer: Medicare HMO | Admitting: Pulmonary Disease

## 2023-06-13 DIAGNOSIS — M5451 Vertebrogenic low back pain: Secondary | ICD-10-CM | POA: Diagnosis not present

## 2023-06-20 DIAGNOSIS — M5451 Vertebrogenic low back pain: Secondary | ICD-10-CM | POA: Diagnosis not present

## 2023-06-27 DIAGNOSIS — M5451 Vertebrogenic low back pain: Secondary | ICD-10-CM | POA: Diagnosis not present

## 2023-07-05 DIAGNOSIS — M5451 Vertebrogenic low back pain: Secondary | ICD-10-CM | POA: Diagnosis not present

## 2023-07-13 DIAGNOSIS — M79644 Pain in right finger(s): Secondary | ICD-10-CM | POA: Insufficient documentation

## 2023-07-14 ENCOUNTER — Other Ambulatory Visit: Payer: Self-pay | Admitting: Nurse Practitioner

## 2023-08-02 ENCOUNTER — Encounter: Payer: Self-pay | Admitting: Neurology

## 2023-08-16 DIAGNOSIS — D225 Melanocytic nevi of trunk: Secondary | ICD-10-CM | POA: Diagnosis not present

## 2023-08-16 DIAGNOSIS — L570 Actinic keratosis: Secondary | ICD-10-CM | POA: Diagnosis not present

## 2023-08-16 DIAGNOSIS — Z1283 Encounter for screening for malignant neoplasm of skin: Secondary | ICD-10-CM | POA: Diagnosis not present

## 2023-08-16 DIAGNOSIS — X32XXXD Exposure to sunlight, subsequent encounter: Secondary | ICD-10-CM | POA: Diagnosis not present

## 2023-08-16 DIAGNOSIS — L820 Inflamed seborrheic keratosis: Secondary | ICD-10-CM | POA: Diagnosis not present

## 2023-09-20 ENCOUNTER — Ambulatory Visit (INDEPENDENT_AMBULATORY_CARE_PROVIDER_SITE_OTHER): Payer: Medicare HMO | Admitting: Internal Medicine

## 2023-09-20 VITALS — BP 110/62 | HR 70 | Temp 98.5°F | Ht 67.0 in | Wt 173.0 lb

## 2023-09-20 DIAGNOSIS — G20B2 Parkinson's disease with dyskinesia, with fluctuations: Secondary | ICD-10-CM | POA: Diagnosis not present

## 2023-09-20 DIAGNOSIS — J069 Acute upper respiratory infection, unspecified: Secondary | ICD-10-CM | POA: Diagnosis not present

## 2023-09-20 MED ORDER — CEFDINIR 300 MG PO CAPS: 300.0000 mg | ORAL_CAPSULE | Freq: Two times a day (BID) | ORAL | 0 refills | Status: DC

## 2023-09-20 NOTE — Assessment & Plan Note (Signed)
 Acute Hycodan leftovers - use rn Cefdinir po

## 2023-09-20 NOTE — Progress Notes (Signed)
 Subjective:  Patient ID: William [name], male    DOB: 28-Jan-1951  Age: 73 y.o. MRN: [id number]  CC: URI (Pt states he has been having a cough, chest congestion, post nasal drip and drainage since Wednesday)   HPI William [name] presents for URI sx's x 1 week, can't sleep due to cough  Outpatient Medications Prior to Visit  Medication Sig Dispense Refill   betamethasone  dipropionate 0.05 % cream Apply topically 2 (two) times daily as needed.     budesonide -formoterol  (SYMBICORT ) 160-4.5 MCG/ACT inhaler Inhale 2 puffs into the lungs 2 (two) times daily. 1 each 3   carbidopa -levodopa  (SINEMET  IR) 25-100 MG tablet 2 at 7am/2 at 11am/2 at 4pm 540 tablet 2   ferrous sulfate  325 (65 FE) MG tablet Take 65 mg by mouth in the morning and at bedtime.     omeprazole  (PRILOSEC) 40 MG capsule TAKE 1 CAPSULE BY MOUTH TWICE A DAY 180 capsule 0   psyllium (METAMUCIL) 58.6 % powder Take 1 packet by mouth 2 (two) times daily.     rOPINIRole  (REQUIP ) 3 MG tablet Take 1 tablet (3 mg total) by mouth 3 (three) times daily. 270 tablet 1   No facility-administered medications prior to visit.    ROS: Review of Systems  Constitutional:  Positive for chills.  HENT:  Positive for rhinorrhea, sinus pressure and voice change.   Respiratory:  Positive for cough. Negative for shortness of breath.     Objective:  BP 110/62 (BP Location: Left Arm, Patient Position: Sitting, Cuff Size: Normal)   Pulse 70   Temp 98.5 F (36.9 C) (Oral)   Ht 5' 7 (1.702 m)   Wt 173 lb (78.5 kg)   SpO2 95%   BMI 27.10 kg/m   BP Readings from Last 3 Encounters:  09/20/23 110/62  05/20/23 116/65  03/31/23 (!) 144/90    Wt Readings from Last 3 Encounters:  09/20/23 173 lb (78.5 kg)  05/20/23 175 lb (79.4 kg)  03/31/23 174 lb 9.6 oz (79.2 kg)    Physical Exam Constitutional:      General: He is not in acute distress.    Appearance: He is well-developed.     Comments: NAD  Eyes:     Conjunctiva/sclera: Conjunctivae  normal.     Pupils: Pupils are equal, round, and reactive to light.  Neck:     Thyroid : No thyromegaly.     Vascular: No JVD.  Cardiovascular:     Rate and Rhythm: Normal rate and regular rhythm.     Heart sounds: Normal heart sounds. No murmur heard.    No friction rub. No gallop.  Pulmonary:     Effort: Pulmonary effort is normal. No respiratory distress.     Breath sounds: Normal breath sounds. No wheezing or rales.  Chest:     Chest wall: No tenderness.  Abdominal:     General: Bowel sounds are normal. There is no distension.     Palpations: Abdomen is soft. There is no mass.     Tenderness: There is no abdominal tenderness. There is no guarding or rebound.  Musculoskeletal:        General: No tenderness. Normal range of motion.     Cervical back: Normal range of motion.  Lymphadenopathy:     Cervical: No cervical adenopathy.  Skin:    General: Skin is warm and dry.     Findings: No rash.  Neurological:     Mental Status: He is alert and oriented to  person, place, and time.     Cranial Nerves: No cranial nerve deficit.     Motor: No abnormal muscle tone.     Coordination: Coordination normal.     Gait: Gait normal.     Deep Tendon Reflexes: Reflexes are normal and symmetric.  Psychiatric:        Behavior: Behavior normal.        Thought Content: Thought content normal.        Judgment: Judgment normal.     Lab Results  Component Value Date   WBC 3.7 (L) 09/02/2022   HGB 10.9 (L) 09/16/2022   HCT 32.0 (L) 09/16/2022   PLT 156 09/02/2022   GLUCOSE 112 (H) 09/02/2022   CHOL 157 04/06/2017   TRIG 55.0 04/06/2017   HDL 69.70 04/06/2017   LDLCALC 76 04/06/2017   ALT 15 09/02/2022   AST 22 09/02/2022   NA 143 09/16/2022   K 4.0 09/16/2022   CL 108 09/02/2022   CREATININE 1.25 (H) 09/02/2022   BUN 27 (H) 09/02/2022   CO2 24 09/02/2022   TSH 2.40 11/28/2018   PSA 0.98 04/06/2017    No results found.  Assessment & Plan:   Problem List Items Addressed This  Visit     Parkinson's disease (HCC)   On Rx      URI (upper respiratory infection) - Primary   Acute Hycodan leftovers - use rn Cefdinir  po      Relevant Medications   cefdinir  (OMNICEF ) 300 MG capsule      Meds ordered this encounter  Medications   cefdinir  (OMNICEF ) 300 MG capsule    Sig: Take 1 capsule (300 mg total) by mouth 2 (two) times daily.    Dispense:  20 capsule    Refill:  0      Follow-up: No follow-ups on file.  Marolyn Noel, MD

## 2023-09-20 NOTE — Assessment & Plan Note (Signed)
On Rx 

## 2023-09-23 ENCOUNTER — Other Ambulatory Visit: Payer: Self-pay | Admitting: Neurology

## 2023-09-23 DIAGNOSIS — G20B1 Parkinson's disease with dyskinesia, without mention of fluctuations: Secondary | ICD-10-CM

## 2023-09-23 DIAGNOSIS — G20A1 Parkinson's disease without dyskinesia, without mention of fluctuations: Secondary | ICD-10-CM

## 2023-09-30 NOTE — Progress Notes (Unsigned)
Assessment/Plan:   1.  Parkinsons Disease  -continue carbidopa/levodopa 25/100, 2 tablets at 7 AM, 2 tablets at 11 AM, 2 tablets at 4 PM.  -Continue ropinirole, 3 mg 3 times per day.  2.  dyskinesia  -doesn't want amantadine 3.  [facility]  -Last neurocognitive testing in 2019.  I do not think anything clinically has significantly changed since that time.  4.  Trigeminal neuralgia  -Patient underwent left microvascular decompression of the trigeminal nerve on September 16, 2022 with Dr. Jake [name]  -Patient with hyponatremia with carbamazepine  5.  LBP  -Follows with [facility] Subjective:   William [name] was seen today in follow up for Parkinsons disease.  My previous records were reviewed prior to todays visit as well as outside records available to me.  I increased his ropinirole last visit.  He did well with that.  He has had no compulsive behaviors or sleep attacks.  He is continuing on the levodopa and is tolerating that.  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  Current prescribed movement disorder medications: Carbidopa/levodopa 25/100,2 tablets at 7 AM, 2 tablets at 11 AM, 2 tablets at 4 PM.   Ropinirole, 3 mg 3 times per day  Prior meds:  pramipexole er (switched due to a near syncopal episode after drink of alcohol); Lyrica (tolerated fine but when off when had neuralgia surgery); carbamazepine (hyponatremia); rotigotine (helped, but caused rash and was expensive)   ALLERGIES:  No Known Allergies  CURRENT MEDICATIONS:  Outpatient Encounter Medications as of 10/04/2023  Medication Sig   betamethasone dipropionate 0.05 % cream Apply topically 2 (two) times daily as needed.   budesonide-formoterol (SYMBICORT) 160-4.5 MCG/ACT inhaler Inhale 2 puffs into the lungs 2 (two) times daily.   carbidopa-levodopa (SINEMET IR) 25-100 MG tablet TAKE 2 TABLETS BY MOUTH AT 7AM/2 TABS AT 11AM/ 2 TABS AT 4PM   cefdinir (OMNICEF) 300 MG  capsule Take 1 capsule (300 mg total) by mouth 2 (two) times daily.   ferrous sulfate 325 (65 FE) MG tablet Take 65 mg by mouth in the morning and at bedtime.   omeprazole (PRILOSEC) 40 MG capsule TAKE 1 CAPSULE BY MOUTH TWICE A DAY   psyllium (METAMUCIL) 58.6 % powder Take 1 packet by mouth 2 (two) times daily.   rOPINIRole (REQUIP) 3 MG tablet Take 1 tablet (3 mg total) by mouth 3 (three) times daily.   No facility-administered encounter medications on file as of 10/04/2023.    Objective:   PHYSICAL EXAMINATION:    VITALS:   There were no vitals filed for this visit.   GEN:  The patient appears stated age and is in NAD. HEENT:  Normocephalic, AT.   CV:  RRR  Lungs :  ctab   Neurological examination:  Orientation: The patient is alert and oriented x3. Cranial nerves: There is good facial symmetry with mild facial hypomimia. The speech is fluent and clear. Soft palate rises symmetrically and there is no tongue deviation. Hearing is intact to conversational tone. Sensation: Sensation is intact to light touch throughout Motor: Strength is at least antigravity x4.  Movement examination: Tone: There is nl tone in the UE/LE Abnormal movements: mild dyskinesia in the R shoulder.  No tremor today Coordination:  There is no decremation today Gait and Station: The patient ambulates well out of the office    I have reviewed and interpreted the following labs independently    Chemistry  Component Value Date/Time   NA 143 09/16/2022 1148   K 4.0 09/16/2022 1148   CL 108 09/02/2022 0935   CO2 24 09/02/2022 0935   BUN 27 (H) 09/02/2022 0935   CREATININE 1.25 (H) 09/02/2022 0935   CREATININE 1.07 06/27/2018 0915      Component Value Date/Time   CALCIUM 9.2 09/02/2022 0935   ALKPHOS 39 09/02/2022 0935   AST 22 09/02/2022 0935   ALT 15 09/02/2022 0935   BILITOT 0.9 09/02/2022 0935       Lab Results  Component Value Date   WBC 3.7 (L) 09/02/2022   HGB 10.9 (L)  09/16/2022   HCT 32.0 (L) 09/16/2022   MCV 96.6 09/02/2022   PLT 156 09/02/2022    Lab Results  Component Value Date   TSH 2.40 11/28/2018    Total time spent on today's visit was *** minutes, including both face-to-face time and nonface-to-face time.  Time included that spent on review of records (prior notes available to me/labs/imaging if pertinent), discussing treatment and goals, answering patient's questions and coordinating care.    Cc:  Plotnikov, Georgina Quint, MD

## 2023-10-04 ENCOUNTER — Ambulatory Visit: Payer: Medicare HMO | Admitting: Neurology

## 2023-10-04 ENCOUNTER — Encounter: Payer: Self-pay | Admitting: Neurology

## 2023-10-04 VITALS — BP 128/84 | HR 79 | Ht 67.0 in | Wt 172.4 lb

## 2023-10-04 DIAGNOSIS — G20B2 Parkinson's disease with dyskinesia, with fluctuations: Secondary | ICD-10-CM

## 2023-10-04 NOTE — Patient Instructions (Addendum)
Increase exercise! Start amantadine 100 mg three times per day I will see you in 8 weeks

## 2023-10-05 NOTE — Therapy (Signed)
OUTPATIENT PHYSICAL THERAPY NEURO EVALUATION   Patient Name: William [name] MRN: [id number] DOB:Jan 22, 1951, 73 y.o., male Today's Date: 10/06/2023   PCP: Tresa Garter, MD   REFERRING PROVIDER: Vladimir Faster, DO  END OF SESSION:  PT End of Session - 10/06/23 1020     Visit Number 1    Number of Visits 9    Date for PT Re-Evaluation 12/05/23    PT Start Time 1018    PT Stop Time 1100    PT Time Calculation (min) 42 min    Equipment Utilized During Treatment Gait belt    Activity Tolerance Patient tolerated treatment well    Behavior During Therapy WFL for tasks assessed/performed             Past Medical History:  Diagnosis Date   Allergic rhinitis    Anemia, iron deficiency    Arthritis    BPH associated with nocturia    ED (erectile dysfunction)    GERD (gastroesophageal reflux disease)    Hiatal hernia    History of COVID-19 11/2020   per pt moderate symptoms that resovled   History of hypertension    followed by pcp  (per pt resolved no medication since approx 2017)   History of kidney stones    Left ureteral calculus    Parkinson disease [facility])    neurologist--- dr tat;   mild jaw dyskinesia,   gait disorder, tremors   Peyronie's disease    PONV (postoperative nausea and vomiting)    S/P dilatation of esophageal stricture    Trigeminal neuropathy    left side   Ulcerative colitis    followed by dr Marina Goodell (GI)//   1992 s/p  total abdominal colectomy with ileoanal anastomosis   Wears glasses    Past Surgical History:  Procedure Laterality Date   CRANIECTOMY Left 09/16/2022   Procedure: RETROSIGMOID CRANI FOR MICROVASCULAR DECOMPRESSION;  Surgeon: Bethann Goo, DO;  Location: [facility] OR;  Service: Neurosurgery;  Laterality: Left;   CYSTOSCOPY W/ URETERAL STENT PLACEMENT  2012   for ureteral stricture   CYSTOSCOPY WITH RETROGRADE PYELOGRAM, URETEROSCOPY AND STENT PLACEMENT Left 12/31/2021   Procedure: CYSTOSCOPY WITH RETROGRADE PYELOGRAM,  FLUOROSCOPIC INTERPRETATION, URETEROSCOPY  AND STENT PLACEMENT;  Surgeon: Marcine Matar, MD;  Location[facility];  Service: Urology;  Laterality: Left;   CYSTOSCOPY/URETEROSCOPY/HOLMIUM LASER/STENT PLACEMENT  10/22/2005   @[facility] ;   w/ left ureteral balloon dilatation   HOLMIUM LASER APPLICATION Left 12/31/2021   Procedure: HOLMIUM LASER APPLICATION;  Surgeon: Marcine Matar, MD;  Location: [facility];  Service: Urology;  Laterality: Left;   INGUINAL HERNIA REPAIR     per pt unilateral  approxl 1962   KNEE ARTHROSCOPY Right 02/21/2002   @[facility]    LUMBAR LAMINECTOMY/DECOMPRESSION MICRODISCECTOMY N/A 06/28/2022   Procedure: L4-5 decompression with removal of synovial cyst;  Surgeon: Venita Lick, MD;  Location: [facility] OR;  Service: Orthopedics;  Laterality: N/A;  2.5 hrs 3 C-Bed   NASAL SINUS SURGERY     x2  in 2004   RECONSTRUCTION TENDON PULLEY HAND Right 05/03/2007   @[facility] ;  right long finger   TOTAL COLECTOMY  1992   W/  ILIEOANAL ANASTOMOSIS (for ilieoanal anatomatic stricture , partial bowel obstruction and required peroidic pneumatic dilatation's)   VARICOCELECTOMY     Patient Active Problem List   Diagnosis Date Noted   Pain in finger of right hand 07/13/2023   Acute bacterial bronchitis 12/23/2022   Trigeminal neuralgia 09/16/2022  Synovial cyst of lumbar facet joint 06/28/2022   Degeneration of lumbar intervertebral disc 01/06/2022   Low back pain 12/22/2021   Trigeminal neuralgia of left side of face 10/26/2021   Somatic dysfunction of right sacroiliac joint 02/18/2020   Cough 12/09/2019   Hypertensive disorder 07/23/2019   Lumbosacral spondylosis without myelopathy 05/01/2019   URI (upper respiratory infection) 01/20/2019   Pain in right knee 02/15/2018   Chest pain 05/28/2017   Hyperlipidemia 05/28/2017   Bradycardia 05/28/2017   Chest pain, atypical 05/27/2017   Lightheadedness 05/27/2017   Finger laceration 04/04/2017    Parkinson's disease (HCC) 02/26/2016   Eustachian tube dysfunction 02/06/2016   Hyponatremia 01/26/2016   Tremor 11/12/2015   Visual-spatial impairment 11/12/2015   Stress at work 10/24/2014   Nonspecific abnormal electrocardiogram (ECG) (EKG) 06/18/2014   S/P colectomy 06/18/2014   Well adult exam 06/18/2014   Renal stones 06/18/2014   Elevated BP 06/18/2014   Headache 04/25/2014   Essential hypertension, benign 04/25/2014   Serous otitis media 03/21/2014   Travel advice encounter 04/17/2013   Calcium nephrolithiasis 10/04/2012   Routine health maintenance 10/04/2012   Acute left flank pain 08/03/2011   Unspecified intestinal obstruction 12/03/2008   Other diseases of nasal cavity and sinuses(478.19) 05/30/2008   GERD 02/26/2008   PEPTIC STRICTURE 02/22/2008   HIATAL HERNIA 02/22/2008   Chest tightness or pressure 01/09/2008   ERECTILE DYSFUNCTION 04/07/2007   Allergic rhinitis 04/07/2007   COLITIS, ULCERATIVE NOS 04/07/2007   GASTRIC POLYP, HX OF 04/07/2007    ONSET DATE: 10/04/2023  REFERRING DIAG: G20.B2 (ICD-10-CM) - Parkinson's disease with dyskinesia and fluctuating manifestations (HCC)  THERAPY DIAG:  Unsteadiness on feet  Other abnormalities of gait and mobility  Other symptoms and signs involving the nervous system  Abnormal posture  Rationale for Evaluation and Treatment: Rehabilitation  SUBJECTIVE:                                                                                                                                                                                             SUBJECTIVE STATEMENT: Was diagnosed with PD in 2017 and now having some more issues with his balance. Having more shutter steps and can stop and recalibrate and get into a regular stride. Also has some freezing episodes, unsure of of triggers. Saw Dr. Arbutus Leas the other day and was prescribed Amantadine, still waiting to hear if it is ready. Also having some more fidgeting with his  hands. No falls. Slacked off the last 2-3 months with exercise. Has a full gym at his house - has a treadmill, free weights, universal gym, medicine balls. Tried doing RSB,  but has some arthritis in his fingers, so haven't been doing that. Tying his shoes gets a little bit frustrating or getting on a shirt or a jacket can be a little bit of a challenge. Feels like his speech is a little bit affected as well. Also has situations where he is talking about something and knows the word, but can't get it out. Notes R shoulder has been giving him some issues and that is relatively new.   Pt accompanied by: self  PERTINENT HISTORY: PMH: PD, MCI, trigeminal neuralgia, LBP   Some dyskinesia  mild dyskinesia in the R leg and bilateral UE/hands   PAIN:  Are you having pain? No  PRECAUTIONS: None  RED FLAGS: None   WEIGHT BEARING RESTRICTIONS: No  FALLS: Has patient fallen in last 6 months? No  LIVING ENVIRONMENT: Lives with: lives with their spouse Lives in: House/apartment Stairs: Yes: Internal: 13 steps; on right going up and has a regular flight of stairs to get to the gym, but otherwise does not need to do steps  and External: 3 steps; on right going up Has following equipment at home: None  PLOF: Independent, Vocation/Vocational requirements: Retired, Special educational needs teacher for about 30 years , and Leisure: reading  PATIENT GOALS: Wants to maintain and not feel like he's going to fall. Improve overall strength   OBJECTIVE:  Note: Objective measures were completed at Evaluation unless otherwise noted.  COGNITION: Overall cognitive status: Within functional limits for tasks assessed   SENSATION: Light touch: WFL  COORDINATION: Heel to shin: WNL bilaterally   POSTURE: rounded shoulders and forward head  LOWER EXTREMITY ROM:    Decr hamstring ROM with LLE   LOWER EXTREMITY MMT:    MMT Right Eval Left Eval  Hip flexion 4+ 4+  Hip extension    Hip abduction    Hip adduction     Hip internal rotation    Hip external rotation    Knee flexion 5 4+  Knee extension 5 4+  Ankle dorsiflexion 5 5  Ankle plantarflexion    Ankle inversion    Ankle eversion    (Blank rows = not tested)  BED MOBILITY:  Pt reports difficulties getting in the bed, has a tough time pressing with his R hand/knuckle.   TRANSFERS: Assistive device utilized: None  Sit to stand: SBA Stand to sit: SBA Performs with no UE support    GAIT: Gait pattern: decreased arm swing- Right, decreased arm swing- Left, decreased stride length, and decreased trunk rotation Distance walked: Clinic distances  Assistive device utilized: None Level of assistance: Modified independence and SBA Comments: Noted freezing with turns, when pt was going to turn to the R, and noted pt otherwise with more shuffled steps when turning   Pt noting some festination at home with incr anterior weight shift   FUNCTIONAL TESTS:  5 times sit to stand: 14.7 seconds with no UE support  10 meter walk test: 9.7 seconds = 3.38 ft/sec    [facility] PT Assessment - 10/06/23 1039       Standardized Balance Assessment   Standardized Balance Assessment Mini-BESTest;Timed Up and Go Test      Mini-BESTest   Sit To Stand Normal: Comes to stand without use of hands and stabilizes independently.    Rise to Toes Moderate: Heels up, but not full range (smaller than when holding hands), OR noticeable instability for 3 s.    Stand on one leg (left) Moderate: < 20 s   3 seconds,  14.6 seconds   Stand on one leg (right) Moderate: < 20 s   2 seconds, 13 seconds   Stand on one leg - lowest score 1    Compensatory Stepping Correction - Forward Moderate: More than one step is required to recover equilibrium   2-3 small steps   Compensatory Stepping Correction - Backward Moderate: More than one step is required to recover equilibrium   ~5 small steps   Compensatory Stepping Correction - Left Lateral Moderate: Several steps to recover equilibrium    3-4 small steps   Compensatory Stepping Correction - Right Lateral Moderate: Several steps to recover equilibrium   3-4 small steps   Stepping Corredtion Lateral - lowest score 1    Stance - Feet together, eyes open, firm surface  Normal: 30s    Stance - Feet together, eyes closed, foam surface  Moderate: < 30s   incr postural sway   Incline - Eyes Closed Normal: Stands independently 30s and aligns with gravity    Change in Gait Speed Normal: Significantly changes walkling speed without imbalance    Walk with head turns - Horizontal Normal: performs head turns with no change in gait speed and good balance    Walk with pivot turns Normal: Turns with feet close FAST (< 3 steps) with good balance.    Step over obstacles Normal: Able to step over box with minimal change of gait speed and with good balance.    Timed UP & GO with Dual Task Normal: No noticeable change in sitting, standing or walking while backward counting when compared to TUG without    Mini-BEST total score 22      Timed Up and Go Test   Normal TUG (seconds) 9    Cognitive TUG (seconds) 9.8                                                                                                                                         TREATMENT DATE:   N/A during eval    PATIENT EDUCATION: Education details: Clinical findings, POC, areas that PD specific PT will address, discussed OT/ST evals regarding difficulties pt has noticed with pt in agreement about being proactive regarding these as has not received PD therapy before, PT to send referral request to Dr. Arbutus Leas  Person educated: Patient Education method: Explanation Education comprehension: verbalized understanding  HOME EXERCISE PROGRAM: Will provide at future session   GOALS: Goals reviewed with patient? Yes  SHORT TERM GOALS: ALL STGS = LTGS  LONG TERM GOALS: Target date: 11/03/2023  Pt will be independent with final PD specific HEP for gait, balance, functional  strength.  Baseline:  Goal status: INITIAL  2.  Pt will improve miniBEST to at least a 25/28 in order to demo decr fall risk.  Baseline: 22/28 Goal status: INITIAL  3.  Pt will improve 5x sit<>stand to less than or equal to  12.5 sec to demonstrate improved functional strength and transfer efficiency.  Baseline: 14.7 seconds with no UE support Goal status: INITIAL  4.  Pt will verbalize understanding of local Parkinson's disease resources, including options for continued community fitness after D/C.   Baseline:  Goal status: INITIAL   ASSESSMENT:  CLINICAL IMPRESSION: Patient is a 73 year old male referred to Neuro OPPT for PD.   Pt's PMH is significant for: PD(diagnosed 2017), MCI, trigeminal neuralgia, LBP. This is pt's first time getting PD specific PT. The following deficits were present during the exam: impaired timing/coordination of gait, postural abnormalities, bradykinesia, impaired balance, dyskinesias in BUE. Based on miniBEST, pt is an incr risk for falls. Pt most challenged by stepping strategies, taking multiple small steps to maintain balance. Pt also reporting some freezing/festination episodes at home. Noted pt with a freezing episode when turning during eval. Pt would benefit from skilled PT to address these impairments and functional limitations to maximize functional mobility independence and improve balance/decr fall risk.    OBJECTIVE IMPAIRMENTS: Abnormal gait, decreased activity tolerance, decreased balance, decreased coordination, impaired flexibility, and postural dysfunction.   ACTIVITY LIMITATIONS: bed mobility and locomotion level  PARTICIPATION LIMITATIONS:  N/A  PERSONAL FACTORS: Age, Behavior pattern, Past/current experiences, Time since onset of injury/illness/exacerbation, and 3+ comorbidities: PD, MCI, trigeminal neuralgia, LBP   are also affecting patient's functional outcome.   REHAB POTENTIAL: Excellent  CLINICAL DECISION MAKING:  Stable/uncomplicated  EVALUATION COMPLEXITY: Low  PLAN:  PT FREQUENCY: 2x/week  PT DURATION: 8 weeks - anticipate just 4 weeks   PLANNED INTERVENTIONS: 97164- PT Re-evaluation, 97110-Therapeutic exercises, 97530- Therapeutic activity, 97112- Neuromuscular re-education, 97535- Self Care, 16109- Manual therapy, [phone]- Gait training, Patient/Family education, Balance training, and Stair training  PLAN FOR NEXT SESSION: Give HEP- standing PWR moves, stepping strategies!, functional strength. Work on stepping strategies (all directions), turning strategies, and strategies to help with freezing. High level balance tasks   Drake Leach, PT, DPT 10/06/2023, 12:07 PM

## 2023-10-06 ENCOUNTER — Ambulatory Visit: Payer: Medicare HMO | Attending: Neurology | Admitting: Physical Therapy

## 2023-10-06 DIAGNOSIS — R293 Abnormal posture: Secondary | ICD-10-CM | POA: Diagnosis not present

## 2023-10-06 DIAGNOSIS — R2689 Other abnormalities of gait and mobility: Secondary | ICD-10-CM | POA: Insufficient documentation

## 2023-10-06 DIAGNOSIS — G20B2 Parkinson's disease with dyskinesia, with fluctuations: Secondary | ICD-10-CM | POA: Diagnosis not present

## 2023-10-06 DIAGNOSIS — R2681 Unsteadiness on feet: Secondary | ICD-10-CM | POA: Insufficient documentation

## 2023-10-06 DIAGNOSIS — R29818 Other symptoms and signs involving the nervous system: Secondary | ICD-10-CM | POA: Diagnosis not present

## 2023-10-11 ENCOUNTER — Other Ambulatory Visit: Payer: Self-pay

## 2023-10-11 ENCOUNTER — Ambulatory Visit: Payer: Medicare HMO | Admitting: Physical Therapy

## 2023-10-11 ENCOUNTER — Telehealth: Payer: Self-pay | Admitting: Physical Therapy

## 2023-10-11 ENCOUNTER — Telehealth: Payer: Self-pay | Admitting: Neurology

## 2023-10-11 ENCOUNTER — Other Ambulatory Visit: Payer: Self-pay | Admitting: Nurse Practitioner

## 2023-10-11 DIAGNOSIS — R293 Abnormal posture: Secondary | ICD-10-CM

## 2023-10-11 DIAGNOSIS — R479 Unspecified speech disturbances: Secondary | ICD-10-CM

## 2023-10-11 DIAGNOSIS — R29818 Other symptoms and signs involving the nervous system: Secondary | ICD-10-CM | POA: Diagnosis not present

## 2023-10-11 DIAGNOSIS — R2681 Unsteadiness on feet: Secondary | ICD-10-CM | POA: Diagnosis not present

## 2023-10-11 DIAGNOSIS — G20B2 Parkinson's disease with dyskinesia, with fluctuations: Secondary | ICD-10-CM | POA: Diagnosis not present

## 2023-10-11 DIAGNOSIS — R279 Unspecified lack of coordination: Secondary | ICD-10-CM

## 2023-10-11 DIAGNOSIS — Z7409 Other reduced mobility: Secondary | ICD-10-CM

## 2023-10-11 DIAGNOSIS — R2689 Other abnormalities of gait and mobility: Secondary | ICD-10-CM

## 2023-10-11 MED ORDER — AMANTADINE HCL 100 MG PO CAPS: 100.0000 mg | ORAL_CAPSULE | Freq: Three times a day (TID) | ORAL | 0 refills | Status: DC

## 2023-10-11 NOTE — Telephone Encounter (Signed)
Dr. Cristela Felt. Auriemma was evaluated by PT on 10/06/23.  The patient would benefit from an OT evaluation for difficulties with IADLs/decr coordination and ST evaluation due to speech difficulties.   If you agree, please place an order in [employer] workque in [facility] or fax the order to [phone]. Thank you, Sherlie Ban, PT, DPT 10/11/23 8:35 AM    [facility] [address] [place], [place]  [address] Phone:  [phone] Fax:  [phone]

## 2023-10-11 NOTE — Telephone Encounter (Signed)
Patient states that he was to start a new medication ( he does not know the name of the medication ). [facility] has not gotten anything from [place] about the medication. Please send in the [facility] to [facility] on [address]

## 2023-10-11 NOTE — Therapy (Signed)
OUTPATIENT PHYSICAL THERAPY NEURO TREATMENT   Patient Name: William [name] MRN: [id number] DOB:1951/03/08, 73 y.o., male Today's Date: 10/11/2023   PCP: Tresa Garter, MD   REFERRING PROVIDER: Vladimir Faster, DO  END OF SESSION:  PT End of Session - 10/11/23 0804     Visit Number 2    Number of Visits 9    Date for PT Re-Evaluation 12/05/23    PT Start Time 0802    PT Stop Time 0842    PT Time Calculation (min) 40 min    Equipment Utilized During Treatment --    Activity Tolerance Patient tolerated treatment well    Behavior During Therapy [facility] for tasks assessed/performed              Past Medical History:  Diagnosis Date   Allergic rhinitis    Anemia, iron deficiency    Arthritis    BPH associated with nocturia    ED (erectile dysfunction)    GERD (gastroesophageal reflux disease)    Hiatal hernia    History of COVID-19 11/2020   per pt moderate symptoms that resovled   History of hypertension    followed by pcp  (per pt resolved no medication since approx 2017)   History of kidney stones    Left ureteral calculus    Parkinson disease [facility])    neurologist--- dr tat;   mild jaw dyskinesia,   gait disorder, tremors   Peyronie's disease    PONV (postoperative nausea and vomiting)    S/P dilatation of esophageal stricture    Trigeminal neuropathy    left side   Ulcerative colitis    followed by dr Marina Goodell (GI)//   1992 s/p  total abdominal colectomy with ileoanal anastomosis   Wears glasses    Past Surgical History:  Procedure Laterality Date   CRANIECTOMY Left 09/16/2022   Procedure: RETROSIGMOID CRANI FOR MICROVASCULAR DECOMPRESSION;  Surgeon: Bethann Goo, DO;  Location: [facility] OR;  Service: Neurosurgery;  Laterality: Left;   CYSTOSCOPY W/ URETERAL STENT PLACEMENT  2012   for ureteral stricture   CYSTOSCOPY WITH RETROGRADE PYELOGRAM, URETEROSCOPY AND STENT PLACEMENT Left 12/31/2021   Procedure: CYSTOSCOPY WITH RETROGRADE PYELOGRAM, FLUOROSCOPIC  INTERPRETATION, URETEROSCOPY  AND STENT PLACEMENT;  Surgeon: Marcine Matar, MD;  Location: [facility];  Service: Urology;  Laterality: Left;   CYSTOSCOPY/URETEROSCOPY/HOLMIUM LASER/STENT PLACEMENT  10/22/2005   @[facility] ;   w/ left ureteral balloon dilatation   HOLMIUM LASER APPLICATION Left 12/31/2021   Procedure: HOLMIUM LASER APPLICATION;  Surgeon: Marcine Matar, MD;  Location: [facility];  Service: Urology;  Laterality: Left;   INGUINAL HERNIA REPAIR     per pt unilateral  approxl 1962   KNEE ARTHROSCOPY Right 02/21/2002   @[facility]    LUMBAR LAMINECTOMY/DECOMPRESSION MICRODISCECTOMY N/A 06/28/2022   Procedure: L4-5 decompression with removal of synovial cyst;  Surgeon: Venita Lick, MD;  Location: [facility] OR;  Service: Orthopedics;  Laterality: N/A;  2.5 hrs 3 C-Bed   NASAL SINUS SURGERY     x2  in 2004   RECONSTRUCTION TENDON PULLEY HAND Right 05/03/2007   @[facility] ;  right long finger   TOTAL COLECTOMY  1992   W/  ILIEOANAL ANASTOMOSIS (for ilieoanal anatomatic stricture , partial bowel obstruction and required peroidic pneumatic dilatation's)   VARICOCELECTOMY     Patient Active Problem List   Diagnosis Date Noted   Pain in finger of right hand 07/13/2023   Acute bacterial bronchitis 12/23/2022   Trigeminal neuralgia 09/16/2022  Synovial cyst of lumbar facet joint 06/28/2022   Degeneration of lumbar intervertebral disc 01/06/2022   Low back pain 12/22/2021   Trigeminal neuralgia of left side of face 10/26/2021   Somatic dysfunction of right sacroiliac joint 02/18/2020   Cough 12/09/2019   Hypertensive disorder 07/23/2019   Lumbosacral spondylosis without myelopathy 05/01/2019   URI (upper respiratory infection) 01/20/2019   Pain in right knee 02/15/2018   Chest pain 05/28/2017   Hyperlipidemia 05/28/2017   Bradycardia 05/28/2017   Chest pain, atypical 05/27/2017   Lightheadedness 05/27/2017   Finger laceration 04/04/2017   Parkinson's disease  (HCC) 02/26/2016   Eustachian tube dysfunction 02/06/2016   Hyponatremia 01/26/2016   Tremor 11/12/2015   Visual-spatial impairment 11/12/2015   Stress at work 10/24/2014   Nonspecific abnormal electrocardiogram (ECG) (EKG) 06/18/2014   S/P colectomy 06/18/2014   Well adult exam 06/18/2014   Renal stones 06/18/2014   Elevated BP 06/18/2014   Headache 04/25/2014   Essential hypertension, benign 04/25/2014   Serous otitis media 03/21/2014   Travel advice encounter 04/17/2013   Calcium nephrolithiasis 10/04/2012   Routine health maintenance 10/04/2012   Acute left flank pain 08/03/2011   Unspecified intestinal obstruction 12/03/2008   Other diseases of nasal cavity and sinuses(478.19) 05/30/2008   GERD 02/26/2008   PEPTIC STRICTURE 02/22/2008   HIATAL HERNIA 02/22/2008   Chest tightness or pressure 01/09/2008   ERECTILE DYSFUNCTION 04/07/2007   Allergic rhinitis 04/07/2007   COLITIS, ULCERATIVE NOS 04/07/2007   GASTRIC POLYP, HX OF 04/07/2007    ONSET DATE: 10/04/2023  REFERRING DIAG: G20.B2 (ICD-10-CM) - Parkinson's disease with dyskinesia and fluctuating manifestations (HCC)  THERAPY DIAG:  Unsteadiness on feet  Other abnormalities of gait and mobility  Abnormal posture  Rationale for Evaluation and Treatment: Rehabilitation  SUBJECTIVE:                                                                                                                                                                                             SUBJECTIVE STATEMENT: Pt reports doing well, has some pain in first MCP joint of R hand today. Did not provide pain rating. No falls, walked 20 minutes on the treadmill yesterday.   Pt accompanied by: self  PERTINENT HISTORY: PMH: PD, MCI, trigeminal neuralgia, LBP   Some dyskinesia  mild dyskinesia in the R leg and bilateral UE/hands   PAIN:  Are you having pain? Yes: NPRS scale: N/A Pain location: 1st MCP of R hand Pain description:  achy/sore  PRECAUTIONS: None  RED FLAGS: None   WEIGHT BEARING RESTRICTIONS: No  FALLS: Has patient fallen in last 6 months? No  LIVING ENVIRONMENT: Lives with: lives with their spouse Lives in: House/apartment Stairs: Yes: Internal: 13 steps; on right going up and has a regular flight of stairs to get to the gym, but otherwise does not need to do steps  and External: 3 steps; on right going up Has following equipment at home: None  PLOF: Independent, Vocation/Vocational requirements: Retired, Special educational needs teacher for about 30 years , and Leisure: reading  PATIENT GOALS: Wants to maintain and not feel like he's going to fall. Improve overall strength   OBJECTIVE:  Note: Objective measures were completed at Evaluation unless otherwise noted.  COGNITION: Overall cognitive status: Within functional limits for tasks assessed   SENSATION: Light touch: WFL  COORDINATION: Heel to shin: WNL bilaterally   POSTURE: rounded shoulders and forward head  LOWER EXTREMITY ROM:    Decr hamstring ROM with LLE   LOWER EXTREMITY MMT:    MMT Right Eval Left Eval  Hip flexion 4+ 4+  Hip extension    Hip abduction    Hip adduction    Hip internal rotation    Hip external rotation    Knee flexion 5 4+  Knee extension 5 4+  Ankle dorsiflexion 5 5  Ankle plantarflexion    Ankle inversion    Ankle eversion    (Blank rows = not tested)  BED MOBILITY:  Pt reports difficulties getting in the bed, has a tough time pressing with his R hand/knuckle.   TRANSFERS: Assistive device utilized: None  Sit to stand: SBA Stand to sit: SBA Performs with no UE support    GAIT: Gait pattern: decreased arm swing- Right, decreased arm swing- Left, decreased stride length, and decreased trunk rotation Distance walked: Clinic distances  Assistive device utilized: None Level of assistance: Modified independence and SBA Comments: Noted freezing with turns, when pt was going to turn to the R, and  noted pt otherwise with more shuffled steps when turning   Pt noting some festination at home with incr anterior weight shift   FUNCTIONAL TESTS:  5 times sit to stand: 14.7 seconds with no UE support  10 meter walk test: 9.7 seconds = 3.38 ft/sec                                                                                                                                TREATMENT:   Gait Training  The following treadmill training was completed for aerobic/neural priming, endurance, and gait speed. CGA w/mix of single and BUE support throughout.  - Warmup: 3:00 up to 2.2 mph. Noted decreased step clearance of LLE  - HIIT: 4:00 30sec ON/OFF alternating green theraball kicks / normal gait at 2.2 mph. Min cues for LARGE kicks, which pt able to demonstrate well.  - Cooldown: 1:00 at 2.2 mph. Pt w/increased step clearance of LLE following activity.   Ther Ex  Established initial HEP (see bolded below) for improved lateral weight shifting, stepping strategies and large movements:  Alt side step w/contralateral OH reach to visual target, x10 reps per side. Mod multimodal cues required initially to perform properly, as pt taking shuffling steps rather than single large step. Noted decreased step length of LLE throughout as well as L hand in fist.  Alt fwd step w/arms in abduction, x8 reps per side. Max verbal cues for LARGE hands and spirit fingers (pt prefers Ricard Dillon cue). Increased difficulty when stepping forward w/RLE. Placed chair to pt's R side for safety, but no LOB throughout.   Ther Act Provided therapeutic listening as pt discussed his book, My Life with PD, and why he was inspired to write it.     PATIENT EDUCATION: Education details: Initial HEP, next appointment date and time  Person educated: Patient Education method: Programmer, multimedia, Demonstration, Verbal cues, and Handouts Education comprehension: verbalized understanding, returned demonstration, and verbal cues required  HOME  EXERCISE PROGRAM: Access Code: [email or web address] URL: [email or web address] Date: 10/11/2023 Prepared by: Alethia Berthold Bora Bost  Exercises - Step Forward with Arms Reaching to Sides  - 1 x daily - 7 x weekly - 3 sets - 10 reps - Standing Reach to Opposite Side with Weight Shift  - 1 x daily - 7 x weekly - 3 sets - 10 reps  GOALS: Goals reviewed with patient? Yes  SHORT TERM GOALS: ALL STGS = LTGS  LONG TERM GOALS: Target date: 11/03/2023  Pt will be independent with final PD specific HEP for gait, balance, functional strength.  Baseline:  Goal status: INITIAL  2.  Pt will improve miniBEST to at least a 25/28 in order to demo decr fall risk.  Baseline: 22/28 Goal status: INITIAL  3.  Pt will improve 5x sit<>stand to less than or equal to 12.5 sec to demonstrate improved functional strength and transfer efficiency.  Baseline: 14.7 seconds with no UE support Goal status: INITIAL  4.  Pt will verbalize understanding of local Parkinson's disease resources, including options for continued community fitness after D/C.   Baseline:  Goal status: INITIAL   ASSESSMENT:  CLINICAL IMPRESSION: Emphasis of skilled PT session on gait training w/emphasis on increased step clearance and establishing initial HEP. Pt performed well on TM but required min-mod verbal cues for upright posture and increased step clearance of LLE, as he tends to lean forward and shuffle. Pt most challenged w/alt large fwd step w/open arms and tends to keep hands in fists (L>R). Continue POC.    OBJECTIVE IMPAIRMENTS: Abnormal gait, decreased activity tolerance, decreased balance, decreased coordination, impaired flexibility, and postural dysfunction.   ACTIVITY LIMITATIONS: bed mobility and locomotion level  PARTICIPATION LIMITATIONS:  N/A  PERSONAL FACTORS: Age, Behavior pattern, Past/current experiences, Time since onset of injury/illness/exacerbation, and 3+ comorbidities: PD, MCI, trigeminal neuralgia, LBP    are also affecting patient's functional outcome.   REHAB POTENTIAL: Excellent  CLINICAL DECISION MAKING: Stable/uncomplicated  EVALUATION COMPLEXITY: Low  PLAN:  PT FREQUENCY: 2x/week  PT DURATION: 8 weeks - anticipate just 4 weeks   PLANNED INTERVENTIONS: 97164- PT Re-evaluation, 97110-Therapeutic exercises, 97530- Therapeutic activity, 97112- Neuromuscular re-education, 97535- Self Care, [id number]- Manual therapy, [phone]- Gait training, Patient/Family education, Balance training, and Stair training  PLAN FOR NEXT SESSION: Add to HEP- standing PWR moves, stepping strategies!, functional strength. Work on stepping strategies (all directions), turning strategies, and strategies to help with freezing. High level balance tasks. Gait on treadmill (ball kicks, speed intervals, inclines)    Colby Catanese E Evangelia Whitaker, PT, DPT 10/11/2023, 8:48 AM

## 2023-10-13 ENCOUNTER — Ambulatory Visit: Payer: Medicare HMO | Admitting: Physical Therapy

## 2023-10-13 ENCOUNTER — Encounter: Payer: Self-pay | Admitting: Physical Therapy

## 2023-10-13 DIAGNOSIS — R2689 Other abnormalities of gait and mobility: Secondary | ICD-10-CM

## 2023-10-13 DIAGNOSIS — R293 Abnormal posture: Secondary | ICD-10-CM | POA: Diagnosis not present

## 2023-10-13 DIAGNOSIS — R2681 Unsteadiness on feet: Secondary | ICD-10-CM | POA: Diagnosis not present

## 2023-10-13 DIAGNOSIS — R29818 Other symptoms and signs involving the nervous system: Secondary | ICD-10-CM

## 2023-10-13 DIAGNOSIS — G20B2 Parkinson's disease with dyskinesia, with fluctuations: Secondary | ICD-10-CM | POA: Diagnosis not present

## 2023-10-13 NOTE — Therapy (Signed)
OUTPATIENT PHYSICAL THERAPY NEURO TREATMENT   Patient Name: William [name] MRN: [id number] DOB:08/31/1951, 73 y.o., male Today's Date: 10/13/2023   PCP: Tresa Garter, MD   REFERRING PROVIDER: Vladimir Faster, DO  END OF SESSION:  PT End of Session - 10/13/23 0848     Visit Number 3    Number of Visits 9    Date for PT Re-Evaluation 12/05/23    PT Start Time 0846    PT Stop Time 0925    PT Time Calculation (min) 39 min    Equipment Utilized During Treatment Gait belt    Activity Tolerance Patient tolerated treatment well    Behavior During Therapy WFL for tasks assessed/performed              Past Medical History:  Diagnosis Date   Allergic rhinitis    Anemia, iron deficiency    Arthritis    BPH associated with nocturia    ED (erectile dysfunction)    GERD (gastroesophageal reflux disease)    Hiatal hernia    History of COVID-19 11/2020   per pt moderate symptoms that resovled   History of hypertension    followed by pcp  (per pt resolved no medication since approx 2017)   History of kidney stones    Left ureteral calculus    Parkinson disease [facility])    neurologist--- dr tat;   mild jaw dyskinesia,   gait disorder, tremors   Peyronie's disease    PONV (postoperative nausea and vomiting)    S/P dilatation of esophageal stricture    Trigeminal neuropathy    left side   Ulcerative colitis    followed by dr Marina Goodell (GI)//   1992 s/p  total abdominal colectomy with ileoanal anastomosis   Wears glasses    Past Surgical History:  Procedure Laterality Date   CRANIECTOMY Left 09/16/2022   Procedure: RETROSIGMOID CRANI FOR MICROVASCULAR DECOMPRESSION;  Surgeon: Bethann Goo, DO;  Location: [facility] OR;  Service: Neurosurgery;  Laterality: Left;   CYSTOSCOPY W/ URETERAL STENT PLACEMENT  2012   for ureteral stricture   CYSTOSCOPY WITH RETROGRADE PYELOGRAM, URETEROSCOPY AND STENT PLACEMENT Left 12/31/2021   Procedure: CYSTOSCOPY WITH RETROGRADE PYELOGRAM,  FLUOROSCOPIC INTERPRETATION, URETEROSCOPY  AND STENT PLACEMENT;  Surgeon: Marcine Matar, MD;  [facility]  Service: Urology;  Laterality: Left;   CYSTOSCOPY/URETEROSCOPY/HOLMIUM LASER/STENT PLACEMENT  10/22/2005   @[facility] ;   w/ left ureteral balloon dilatation   HOLMIUM LASER APPLICATION Left 12/31/2021   Procedure: HOLMIUM LASER APPLICATION;  Surgeon: Marcine Matar, MD;  Location: [facility];  Service: Urology;  Laterality: Left;   INGUINAL HERNIA REPAIR     per pt unilateral  approxl 1962   KNEE ARTHROSCOPY Right 02/21/2002   @[facility]    LUMBAR LAMINECTOMY/DECOMPRESSION MICRODISCECTOMY N/A 06/28/2022   Procedure: L4-5 decompression with removal of synovial cyst;  Surgeon: Venita Lick, MD;  Location: [facility] OR;  Service: Orthopedics;  Laterality: N/A;  2.5 hrs 3 C-Bed   NASAL SINUS SURGERY     x2  in 2004   RECONSTRUCTION TENDON PULLEY HAND Right 05/03/2007   @[facility] ;  right long finger   TOTAL COLECTOMY  1992   W/  ILIEOANAL ANASTOMOSIS (for ilieoanal anatomatic stricture , partial bowel obstruction and required peroidic pneumatic dilatation's)   VARICOCELECTOMY     Patient Active Problem List   Diagnosis Date Noted   Pain in finger of right hand 07/13/2023   Acute bacterial bronchitis 12/23/2022   Trigeminal neuralgia 09/16/2022  Synovial cyst of lumbar facet joint 06/28/2022   Degeneration of lumbar intervertebral disc 01/06/2022   Low back pain 12/22/2021   Trigeminal neuralgia of left side of face 10/26/2021   Somatic dysfunction of right sacroiliac joint 02/18/2020   Cough 12/09/2019   Hypertensive disorder 07/23/2019   Lumbosacral spondylosis without myelopathy 05/01/2019   URI (upper respiratory infection) 01/20/2019   Pain in right knee 02/15/2018   Chest pain 05/28/2017   Hyperlipidemia 05/28/2017   Bradycardia 05/28/2017   Chest pain, atypical 05/27/2017   Lightheadedness 05/27/2017   Finger laceration 04/04/2017    Parkinson's disease (HCC) 02/26/2016   Eustachian tube dysfunction 02/06/2016   Hyponatremia 01/26/2016   Tremor 11/12/2015   Visual-spatial impairment 11/12/2015   Stress at work 10/24/2014   Nonspecific abnormal electrocardiogram (ECG) (EKG) 06/18/2014   S/P colectomy 06/18/2014   Well adult exam 06/18/2014   Renal stones 06/18/2014   Elevated BP 06/18/2014   Headache 04/25/2014   Essential hypertension, benign 04/25/2014   Serous otitis media 03/21/2014   Travel advice encounter 04/17/2013   Calcium nephrolithiasis 10/04/2012   Routine health maintenance 10/04/2012   Acute left flank pain 08/03/2011   Unspecified intestinal obstruction 12/03/2008   Other diseases of nasal cavity and sinuses(478.19) 05/30/2008   GERD 02/26/2008   PEPTIC STRICTURE 02/22/2008   HIATAL HERNIA 02/22/2008   Chest tightness or pressure 01/09/2008   ERECTILE DYSFUNCTION 04/07/2007   Allergic rhinitis 04/07/2007   COLITIS, ULCERATIVE NOS 04/07/2007   GASTRIC POLYP, HX OF 04/07/2007    ONSET DATE: 10/04/2023  REFERRING DIAG: G20.B2 (ICD-10-CM) - Parkinson's disease with dyskinesia and fluctuating manifestations (HCC)  THERAPY DIAG:  Unsteadiness on feet  Other abnormalities of gait and mobility  Abnormal posture  Other symptoms and signs involving the nervous system  Rationale for Evaluation and Treatment: Rehabilitation  SUBJECTIVE:                                                                                                                                                                                             SUBJECTIVE STATEMENT: No changes since he was last here, has tried his exercises at home.   Pt accompanied by: self  PERTINENT HISTORY: PMH: PD, MCI, trigeminal neuralgia, LBP   Some dyskinesia  mild dyskinesia in the R leg and bilateral UE/hands   PAIN:  Are you having pain? Yes: NPRS scale: N/A Pain location: 1st MCP of R hand Pain description:  achy/sore  PRECAUTIONS: None  RED FLAGS: None   WEIGHT BEARING RESTRICTIONS: No  FALLS: Has patient fallen in last 6 months? No  LIVING ENVIRONMENT: Lives with: lives with their  spouse Lives in: House/apartment Stairs: Yes: Internal: 13 steps; on right going up and has a regular flight of stairs to get to the gym, but otherwise does not need to do steps  and External: 3 steps; on right going up Has following equipment at home: None  PLOF: Independent, Vocation/Vocational requirements: Retired, Special educational needs teacher for about 30 years , and Leisure: reading  PATIENT GOALS: Wants to maintain and not feel like he's going to fall. Improve overall strength   OBJECTIVE:  Note: Objective measures were completed at Evaluation unless otherwise noted.  COGNITION: Overall cognitive status: Within functional limits for tasks assessed   SENSATION: Light touch: WFL  COORDINATION: Heel to shin: WNL bilaterally   POSTURE: rounded shoulders and forward head  LOWER EXTREMITY ROM:    Decr hamstring ROM with LLE   LOWER EXTREMITY MMT:    MMT Right Eval Left Eval  Hip flexion 4+ 4+  Hip extension    Hip abduction    Hip adduction    Hip internal rotation    Hip external rotation    Knee flexion 5 4+  Knee extension 5 4+  Ankle dorsiflexion 5 5  Ankle plantarflexion    Ankle inversion    Ankle eversion    (Blank rows = not tested)  BED MOBILITY:  Pt reports difficulties getting in the bed, has a tough time pressing with his R hand/knuckle.   TRANSFERS: Assistive device utilized: None  Sit to stand: SBA Stand to sit: SBA Performs with no UE support    GAIT: Gait pattern: decreased arm swing- Right, decreased arm swing- Left, decreased stride length, and decreased trunk rotation Distance walked: Clinic distances  Assistive device utilized: None Level of assistance: Modified independence and SBA Comments: Noted freezing with turns, when pt was going to turn to the R, and  noted pt otherwise with more shuffled steps when turning   Pt noting some festination at home with incr anterior weight shift   FUNCTIONAL TESTS:  5 times sit to stand: 14.7 seconds with no UE support  10 meter walk test: 9.7 seconds = 3.38 ft/sec                                                                                                                                TREATMENT:    Ther Ex: SciFit with BUE/BLE Multi-Peaks at Gear 5.5 mph for 8 minutes for incr amplitude of stepping, neural priming, reciprocal movement patterns. Pt reporting RPE as 6/10   NMR:  Pt performs PWR! Moves in standing position    PWR! Up for improved posture 10 reps, and an additional 10 reps with raising onto toes for anterior weight shift, cues for opening up hands   PWR! Rock for improved weight shifting 10 reps and an additional 10 reps with lifting up contralateral leg for dynamic SLS, cues to look up at hands, pt challenged by this for balance   PWR! Twist for  improved trunk rotation 20 reps, cues to reset in the middle with tall posture   Cues provided for technique, larger amplitude movement patterns. Pt reporting RPE as 5/10   On blue foam beam: Following multi-directional sheet with big, deliberate steps, performed 2 reps with cues to also call out the name of the steps, pt taking a big step forwards and taking 2 steps to step back to midline, performed an additional 3 reps with pt performing coordinated UE movements with bringing arms in PWR Up when stepping forwards, bringing them forwards when stepping backwards, and bringing arms out to the side when stepping laterally, pt more challenged with coordinated UE movements, needing intermittent cues for sequencing and opening up hands, CGA for balance as needed  Performing a big side step and then alternating SLS taps to 2 cones - 5 cones in total, cued for large side step, performed down and back x2 reps, pt challenged by this, needing CGA for  balance, pt more challenged with SLS tasks on RLE    PATIENT EDUCATION: Education details: Standing PWR Up, [employer], Twist to [employer], purpose of exercises in therapy  Person educated: Patient Education method: Explanation, Demonstration, Verbal cues, and Handouts Education comprehension: verbalized understanding, returned demonstration, and verbal cues required  HOME EXERCISE PROGRAM: Standing PWR Up, Rock, Twist  Access Code: [email or web address] URL: [email or web address] Date: 10/11/2023 Prepared by: Alethia Berthold Plaster  Exercises - Step Forward with Arms Reaching to Sides  - 1 x daily - 7 x weekly - 3 sets - 10 reps - Standing Reach to Opposite Side with Weight Shift  - 1 x daily - 7 x weekly - 3 sets - 10 reps  GOALS: Goals reviewed with patient? Yes  SHORT TERM GOALS: ALL STGS = LTGS  LONG TERM GOALS: Target date: 11/03/2023  Pt will be independent with final PD specific HEP for gait, balance, functional strength.  Baseline:  Goal status: INITIAL  2.  Pt will improve miniBEST to at least a 25/28 in order to demo decr fall risk.  Baseline: 22/28 Goal status: INITIAL  3.  Pt will improve 5x sit<>stand to less than or equal to 12.5 sec to demonstrate improved functional strength and transfer efficiency.  Baseline: 14.7 seconds with no UE support Goal status: INITIAL  4.  Pt will verbalize understanding of local Parkinson's disease resources, including options for continued community fitness after D/C.   Baseline:  Goal status: INITIAL   ASSESSMENT:  CLINICAL IMPRESSION: Today's skilled session focused on SciFit for neural priming and incr amplitude of stepping at start of session, adding standing PWR Up, Rock, and Twist to [employer], and working on Conservation officer, historic buildings stepping/SLS activities. With standing PWR moves, pt needing cues to look at hands and keep hands open as well as tendency to hold them more in fists. With SLS tasks, pt more challenged with standing on RLE for  balance. CGA as needed. Will continue per POC.    OBJECTIVE IMPAIRMENTS: Abnormal gait, decreased activity tolerance, decreased balance, decreased coordination, impaired flexibility, and postural dysfunction.   ACTIVITY LIMITATIONS: bed mobility and locomotion level  PARTICIPATION LIMITATIONS:  N/A  PERSONAL FACTORS: Age, Behavior pattern, Past/current experiences, Time since onset of injury/illness/exacerbation, and 3+ comorbidities: PD, MCI, trigeminal neuralgia, LBP   are also affecting patient's functional outcome.   REHAB POTENTIAL: Excellent  CLINICAL DECISION MAKING: Stable/uncomplicated  EVALUATION COMPLEXITY: Low  PLAN:  PT FREQUENCY: 2x/week  PT DURATION: 8 weeks - anticipate just 4 weeks   PLANNED INTERVENTIONS: [id number]- PT Re-evaluation, 97110-Therapeutic  exercises, 97530- Therapeutic activity, [id number]- Neuromuscular re-education, [phone]- Self Care, [id number]- Manual therapy, [phone]- Gait training, Patient/Family education, Balance training, and Stair training  PLAN FOR NEXT SESSION: Add to HEP- standing PWR moves, stepping strategies!, functional strength. Work on stepping strategies (all directions), turning strategies, and strategies to help with freezing. High level balance tasks. Gait on treadmill (ball kicks, speed intervals, inclines), turning on treadmill   Provide community resources.    Drake Leach, PT, DPT 10/13/2023, 9:27 AM

## 2023-10-17 NOTE — Therapy (Signed)
 OUTPATIENT SPEECH LANGUAGE PATHOLOGY PARKINSON'S EVALUATION   Patient Name: William [name] MRN: [id number] DOB:[birth year], 73 y.o., male Today's Date: 10/18/2023  PCP: Garald Karlynn GAILS MD REFERRING PROVIDER: Evonnie Asberry RAMAN, DO  END OF SESSION:  End of Session - 10/18/23 0901     Visit Number 1    Number of Visits 17    Date for SLP Re-Evaluation 12/13/23    Authorization Type [employer]    SLP Start Time 0930    SLP Stop Time  1015    SLP Time Calculation (min) 45 min    Activity Tolerance Patient tolerated treatment well             Past Medical History:  Diagnosis Date   Allergic rhinitis    Anemia, iron deficiency    Arthritis    BPH associated with nocturia    ED (erectile dysfunction)    GERD (gastroesophageal reflux disease)    Hiatal hernia    History of COVID-19 11/2020   per pt moderate symptoms that resovled   History of hypertension    followed by pcp  (per pt resolved no medication since approx 2017)   History of kidney stones    Left ureteral calculus    Parkinson disease [facility])    neurologist--- dr tat;   mild jaw dyskinesia,   gait disorder, tremors   Peyronie's disease    PONV (postoperative nausea and vomiting)    S/P dilatation of esophageal stricture    Trigeminal neuropathy    left side   Ulcerative colitis    followed by dr abran (GI)//   1992 s/p  total abdominal colectomy with ileoanal anastomosis   Wears glasses    Past Surgical History:  Procedure Laterality Date   CRANIECTOMY Left 09/16/2022   Procedure: RETROSIGMOID CRANI FOR MICROVASCULAR DECOMPRESSION;  Surgeon: Carollee Lani BROCKS, DO;  Location: [facility] OR;  Service: Neurosurgery;  Laterality: Left;   CYSTOSCOPY W/ URETERAL STENT PLACEMENT  2012   for ureteral stricture   CYSTOSCOPY WITH RETROGRADE PYELOGRAM, URETEROSCOPY AND STENT PLACEMENT Left 12/31/2021   Procedure: CYSTOSCOPY WITH RETROGRADE PYELOGRAM, FLUOROSCOPIC INTERPRETATION, URETEROSCOPY  AND STENT PLACEMENT;  Surgeon:  Matilda Senior, MD;  Location: [facility];  Service: Urology;  Laterality: Left;   CYSTOSCOPY/URETEROSCOPY/HOLMIUM LASER/STENT PLACEMENT  10/22/2005   @[facility] ;   w/ left ureteral balloon dilatation   HOLMIUM LASER APPLICATION Left 12/31/2021   Procedure: HOLMIUM LASER APPLICATION;  Surgeon: Matilda Senior, MD;  Location: [facility];  Service: Urology;  Laterality: Left;   INGUINAL HERNIA REPAIR     per pt unilateral  approxl 1962   KNEE ARTHROSCOPY Right 02/21/2002   @[facility]    LUMBAR LAMINECTOMY/DECOMPRESSION MICRODISCECTOMY N/A 06/28/2022   Procedure: L4-5 decompression with removal of synovial cyst;  Surgeon: Burnetta Aures, MD;  Location: [facility] OR;  Service: Orthopedics;  Laterality: N/A;  2.5 hrs 3 C-Bed   NASAL SINUS SURGERY     x2  in 2004   RECONSTRUCTION TENDON PULLEY HAND Right 05/03/2007   @[facility] ;  right long finger   TOTAL COLECTOMY  1992   W/  ILIEOANAL ANASTOMOSIS (for ilieoanal anatomatic stricture , partial bowel obstruction and required peroidic pneumatic dilatation's)   VARICOCELECTOMY     Patient Active Problem List   Diagnosis Date Noted   Pain in finger of right hand 07/13/2023   Acute bacterial bronchitis 12/23/2022   Trigeminal neuralgia 09/16/2022   Synovial cyst of lumbar facet joint 06/28/2022   Degeneration of lumbar intervertebral disc  01/06/2022   Low back pain 12/22/2021   Trigeminal neuralgia of left side of face 10/26/2021   Somatic dysfunction of right sacroiliac joint 02/18/2020   Cough 12/09/2019   Hypertensive disorder 07/23/2019   Lumbosacral spondylosis without myelopathy 05/01/2019   URI (upper respiratory infection) 01/20/2019   Pain in right knee 02/15/2018   Chest pain 05/28/2017   Hyperlipidemia 05/28/2017   Bradycardia 05/28/2017   Chest pain, atypical 05/27/2017   Lightheadedness 05/27/2017   Finger laceration 04/04/2017   Parkinson's disease (HCC) 02/26/2016   Eustachian tube dysfunction 02/06/2016    Hyponatremia 01/26/2016   Tremor 11/12/2015   Visual-spatial impairment 11/12/2015   Stress at work 10/24/2014   Nonspecific abnormal electrocardiogram (ECG) (EKG) 06/18/2014   S/P colectomy 06/18/2014   Well adult exam 06/18/2014   Renal stones 06/18/2014   Elevated BP 06/18/2014   Headache 04/25/2014   Essential hypertension, benign 04/25/2014   Serous otitis media 03/21/2014   Travel advice encounter 04/17/2013   Calcium nephrolithiasis 10/04/2012   Routine health maintenance 10/04/2012   Acute left flank pain 08/03/2011   Unspecified intestinal obstruction 12/03/2008   Other diseases of nasal cavity and sinuses(478.19) 05/30/2008   GERD 02/26/2008   PEPTIC STRICTURE 02/22/2008   HIATAL HERNIA 02/22/2008   Chest tightness or pressure 01/09/2008   ERECTILE DYSFUNCTION 04/07/2007   Allergic rhinitis 04/07/2007   COLITIS, ULCERATIVE NOS 04/07/2007   GASTRIC POLYP, HX OF 04/07/2007    ONSET DATE: 10/11/23 (referral date)  REFERRING DIAG:  Z74.09,Z78.9 (ICD-10-CM) - Impaired mobility and ADLs R47.9 (ICD-10-CM) - Difficulty with speech  G20.B2 (ICD-10-CM) - Parkinson's disease with dyskinesia and fluctuating manifestations   THERAPY DIAG:  Dysarthria and anarthria  Cognitive communication deficit  Rationale for Evaluation and Treatment: Rehabilitation  SUBJECTIVE:   SUBJECTIVE STATEMENT: Experiencing some gradual decline in conversational volume, more overt reduced articulatory precision, and increasing word finding episodes Pt accompanied by: self  PERTINENT HISTORY: PMH: PD (2017), MCI, trigeminal neuralgia, LBP Now experiencing changes in balance and communication, including word finding   PAIN:  Are you having pain? Yes: NPRS scale: 3/10 Pain location: right knee Pain description: chronic Aggravating factors: movement  Relieving factors: not moving   FALLS: Has patient fallen in last 6 months?  No  LIVING ENVIRONMENT: Lives with: lives with their  spouse Lives in: House/apartment  PLOF:  Level of assistance: Independent with ADLs, Independent with IADLs Employment: Retired designer, jewellery)  PATIENT GOALS: to improve articulation and clarity of speech   OBJECTIVE:  Note: Objective measures were completed at Evaluation unless otherwise noted.  COGNITION: Overall cognitive status: Within functional limits for tasks assessed Comments: hx of MCI documented; denied any overt cognitive concerns at this time   MOTOR SPEECH: Overall motor speech: impaired Level of impairment: Word, Phrase, Sentence, and Conversation Respiration: thoracic breathing and clavicular breathing Phonation: low vocal intensity Resonance: WFL Articulation: Impaired: word, phrase, sentence, and conversation Intelligibility: Intelligibility reduced Motor planning: Appears intact Motor speech errors: aware, unaware, and inconsistent Interfering components:  PD Effective technique: slow rate, increased vocal intensity, over articulate, and pause  ORAL MOTOR EXAMINATION: Overall status: Impaired:   Lingual: Bilateral (Strength and Coordination) Comments: some lateral lisp for /s/; reduced coordination for SMR   OBJECTIVE VOICE ASSESSMENT: Sustained ah maximum phonation time: 18 seconds Sustained ah loudness average: 76 dB Oral reading (passage) loudness average: 72 dB Oral reading loudness range: 70-74 dB Conversational loudness average: 68 dB Conversational loudness range: 62-73 dB Voice quality: low vocal intensity and vocal fatigue  Comments: Introduced SLP dysarthria strategies, in which pt able to demo with occasional min A at the sentence level.   Completed audio recording of patients baseline voice without cueing from SLP: Yes (pt's phone)   Pt does not report difficulty with swallowing which does not warrant further evaluation.  PATIENT REPORTED OUTCOME MEASURES (PROM): Communication Participation Item Bank: 24                                                                                                                             TREATMENT DATE:  10-18-23: Provided education re: typical communication changes and challenges related to progression on Parkinson's Disease (PD). Introduced [employer] Program and principles, with brief review of being intentional and introduction of [employer]. Recommended watching What is PD? Video to aid patient comprehension. Ordered Speak Out! Workbook this session. Initiated education and instruction SLOP dysarthria strategies, in which pt able to demo at sentence level with occasional min A to slow rate. Updated HEP for oral reading 5-10 minutes per day.   PATIENT EDUCATION: Education details: see above Person educated: Patient Education method: Chief Technology Officer Education comprehension: verbalized understanding and needs further education  HOME EXERCISE PROGRAM: Speak Out!   GOALS: Goals reviewed with patient? Yes  SHORT TERM GOALS: Target date: 11/14/2023  Pt will complete daily HEP as recommended across 2 sessions  Baseline: Goal status: INITIAL  2.  Pt will achieve targeted dB levels in demonstration of Speak Out! Lessons with 90% accuracy given rare min A over 2 sessions  Baseline:  Goal status: INITIAL  3.  Pt will utilize dysarthria compensations in 10+ minute conversation to be 100% intelligible given occasional min A  Baseline:  Goal status: INITIAL  4.  Pt will utilize word retrieval strategies to aid anomia in 10+ minute conversation given occasional min A  Baseline:  Goal status: INITIAL    LONG TERM GOALS: Target date: 12/13/2023  Pt will complete daily HEP at least 1x/day (BID recommended) > 1 week  Baseline:  Goal status: INITIAL  2.  Pt will utilize dysarthria compensations in 15+ minute conversation to optimize vocal intensity and clarity given rare min A over 2 sessions Baseline:  Goal status: INITIAL  3.  Pt will reduce throat  clearing by 75% by LTG date Baseline: >5 at eval Goal status: INITIAL  4.  Pt will report improved communication effectiveness via PROM by 2 point improvement by last ST session  Baseline: CPIB=24 Goal status: INITIAL  ASSESSMENT:  CLINICAL IMPRESSION: Patient is a 73 y.o. M who was seen today for communication changes related to progression of Parkinson's Disease. Today, pt presents with mild hypokinetic dysarthria c/b imprecise articulation and intermittent rushes in speech. Speech intelligibility rated ~90% in quiet, controlled environment. Speech volume averaged mid-upper 60s dB with mild volume decay as conversation progressed. Pt aware of changes in articulation, particularly /s/ sound, and some volume decline, particularly while fatigued. Reported word finding episodes (  fairly often) in conversation. Denied dysphagia with solids/liquids but endorsed occasional sialorrhea. Occasional throat clearing also evidenced today; hx of GERD on PPI. Denied cognitive changes or challenges at this time; will monitor given hx of MCI documented. Pt would benefit from skilled ST intervention to optimize communication effectiveness and maximize life participation.   OBJECTIVE IMPAIRMENTS: Objective impairments include dysarthria. These impairments are limiting patient from effectively communicating at home and in community.Factors affecting potential to achieve goals and functional outcome are  PD . Patient will benefit from skilled SLP services to address above impairments and improve overall function.  REHAB POTENTIAL: Excellent  PLAN:  SLP FREQUENCY: 2x/week  SLP DURATION: 8 weeks - anticipate 4 weeks   PLANNED INTERVENTIONS: Language facilitation, Environmental controls, Internal/external aids, Functional tasks, Multimodal communication approach, SLP instruction and feedback, Compensatory strategies, Patient/family education, [phone] Treatment of speech (30 or 45 min) , and [id number]- Speech Eval Sound  Prod, Articulate, Phonological    Comer LILLETTE Louder, CCC-SLP 10/18/2023, 10:36 AM

## 2023-10-18 ENCOUNTER — Ambulatory Visit: Payer: Medicare HMO

## 2023-10-18 ENCOUNTER — Ambulatory Visit: Payer: Medicare HMO | Attending: Neurology | Admitting: Physical Therapy

## 2023-10-18 ENCOUNTER — Encounter: Payer: Self-pay | Admitting: Physical Therapy

## 2023-10-18 DIAGNOSIS — R471 Dysarthria and anarthria: Secondary | ICD-10-CM

## 2023-10-18 DIAGNOSIS — R2689 Other abnormalities of gait and mobility: Secondary | ICD-10-CM | POA: Insufficient documentation

## 2023-10-18 DIAGNOSIS — R41841 Cognitive communication deficit: Secondary | ICD-10-CM

## 2023-10-18 DIAGNOSIS — M6281 Muscle weakness (generalized): Secondary | ICD-10-CM | POA: Insufficient documentation

## 2023-10-18 DIAGNOSIS — R293 Abnormal posture: Secondary | ICD-10-CM | POA: Insufficient documentation

## 2023-10-18 DIAGNOSIS — R2681 Unsteadiness on feet: Secondary | ICD-10-CM | POA: Diagnosis not present

## 2023-10-18 DIAGNOSIS — R29818 Other symptoms and signs involving the nervous system: Secondary | ICD-10-CM | POA: Insufficient documentation

## 2023-10-18 NOTE — Patient Instructions (Addendum)
 SPEAK OUT! is a structured program targeting voice in patient's with Parkinson's. This program was developed by [employer]. It is evidence based and based on principles of motor learning. The nonprofit provides free materials to patients being treated by a Speak Out! certified SLP.   www.[email or web address].org for more information  Watch What is Parkinson's Video? on [employer] website   Homework: Read aloud ~5-10 minutes, twice per day using SLOW and OVER-ENUNCIATION to clear up your speech

## 2023-10-18 NOTE — Therapy (Signed)
 OUTPATIENT PHYSICAL THERAPY NEURO TREATMENT   Patient Name: William [name] MRN: [id number] DOB:1950/10/25, 73 y.o., male Today's Date: 10/18/2023   PCP: Garald Karlynn GAILS, MD   REFERRING PROVIDER: Evonnie Asberry RAMAN, DO  END OF SESSION:  PT End of Session - 10/18/23 0848     Visit Number 4    Number of Visits 9    Date for PT Re-Evaluation 12/05/23    PT Start Time 0846    PT Stop Time 0927    PT Time Calculation (min) 41 min    Equipment Utilized During Treatment Gait belt    Activity Tolerance Patient tolerated treatment well    Behavior During Therapy WFL for tasks assessed/performed              Past Medical History:  Diagnosis Date   Allergic rhinitis    Anemia, iron deficiency    Arthritis    BPH associated with nocturia    ED (erectile dysfunction)    GERD (gastroesophageal reflux disease)    Hiatal hernia    History of COVID-19 11/2020   per pt moderate symptoms that resovled   History of hypertension    followed by pcp  (per pt resolved no medication since approx 2017)   History of kidney stones    Left ureteral calculus    Parkinson disease [facility])    neurologist--- dr tat;   mild jaw dyskinesia,   gait disorder, tremors   Peyronie's disease    PONV (postoperative nausea and vomiting)    S/P dilatation of esophageal stricture    Trigeminal neuropathy    left side   Ulcerative colitis    followed by dr abran (GI)//   1992 s/p  total abdominal colectomy with ileoanal anastomosis   Wears glasses    Past Surgical History:  Procedure Laterality Date   CRANIECTOMY Left 09/16/2022   Procedure: RETROSIGMOID CRANI FOR MICROVASCULAR DECOMPRESSION;  Surgeon: Carollee Lani BROCKS, DO;  Location: [facility] OR;  Service: Neurosurgery;  Laterality: Left;   CYSTOSCOPY W/ URETERAL STENT PLACEMENT  2012   for ureteral stricture   CYSTOSCOPY WITH RETROGRADE PYELOGRAM, URETEROSCOPY AND STENT PLACEMENT Left 12/31/2021   Procedure: CYSTOSCOPY WITH RETROGRADE PYELOGRAM,  FLUOROSCOPIC INTERPRETATION, URETEROSCOPY  AND STENT PLACEMENT;  Surgeon: Matilda Senior, MD;  Location: [facility];  Service: Urology;  Laterality: Left;   CYSTOSCOPY/URETEROSCOPY/HOLMIUM LASER/STENT PLACEMENT  10/22/2005   @[facility] ;   w/ left ureteral balloon dilatation   HOLMIUM LASER APPLICATION Left 12/31/2021   Procedure: HOLMIUM LASER APPLICATION;  Surgeon: Matilda Senior, MD;  Location: [facility];  Service: Urology;  Laterality: Left;   INGUINAL HERNIA REPAIR     per pt unilateral  approxl 1962   KNEE ARTHROSCOPY Right 02/21/2002   @[facility]    LUMBAR LAMINECTOMY/DECOMPRESSION MICRODISCECTOMY N/A 06/28/2022   Procedure: L4-5 decompression with removal of synovial cyst;  Surgeon: Burnetta Aures, MD;  Location: [facility] OR;  Service: Orthopedics;  Laterality: N/A;  2.5 hrs 3 C-Bed   NASAL SINUS SURGERY     x2  in 2004   RECONSTRUCTION TENDON PULLEY HAND Right 05/03/2007   @[facility] ;  right long finger   TOTAL COLECTOMY  1992   W/  ILIEOANAL ANASTOMOSIS (for ilieoanal anatomatic stricture , partial bowel obstruction and required peroidic pneumatic dilatation's)   VARICOCELECTOMY     Patient Active Problem List   Diagnosis Date Noted   Pain in finger of right hand 07/13/2023   Acute bacterial bronchitis 12/23/2022   Trigeminal neuralgia 09/16/2022  Synovial cyst of lumbar facet joint 06/28/2022   Degeneration of lumbar intervertebral disc 01/06/2022   Low back pain 12/22/2021   Trigeminal neuralgia of left side of face 10/26/2021   Somatic dysfunction of right sacroiliac joint 02/18/2020   Cough 12/09/2019   Hypertensive disorder 07/23/2019   Lumbosacral spondylosis without myelopathy 05/01/2019   URI (upper respiratory infection) 01/20/2019   Pain in right knee 02/15/2018   Chest pain 05/28/2017   Hyperlipidemia 05/28/2017   Bradycardia 05/28/2017   Chest pain, atypical 05/27/2017   Lightheadedness 05/27/2017   Finger laceration 04/04/2017    Parkinson's disease (HCC) 02/26/2016   Eustachian tube dysfunction 02/06/2016   Hyponatremia 01/26/2016   Tremor 11/12/2015   Visual-spatial impairment 11/12/2015   Stress at work 10/24/2014   Nonspecific abnormal electrocardiogram (ECG) (EKG) 06/18/2014   S/P colectomy 06/18/2014   Well adult exam 06/18/2014   Renal stones 06/18/2014   Elevated BP 06/18/2014   Headache 04/25/2014   Essential hypertension, benign 04/25/2014   Serous otitis media 03/21/2014   Travel advice encounter 04/17/2013   Calcium nephrolithiasis 10/04/2012   Routine health maintenance 10/04/2012   Acute left flank pain 08/03/2011   Unspecified intestinal obstruction 12/03/2008   Other diseases of nasal cavity and sinuses(478.19) 05/30/2008   GERD 02/26/2008   PEPTIC STRICTURE 02/22/2008   HIATAL HERNIA 02/22/2008   Chest tightness or pressure 01/09/2008   ERECTILE DYSFUNCTION 04/07/2007   Allergic rhinitis 04/07/2007   COLITIS, ULCERATIVE NOS 04/07/2007   GASTRIC POLYP, HX OF 04/07/2007    ONSET DATE: 10/04/2023  REFERRING DIAG: G20.B2 (ICD-10-CM) - Parkinson's disease with dyskinesia and fluctuating manifestations (HCC)  THERAPY DIAG:  Unsteadiness on feet  Other abnormalities of gait and mobility  Abnormal posture  Other symptoms and signs involving the nervous system  Rationale for Evaluation and Treatment: Rehabilitation  SUBJECTIVE:                                                                                                                                                                                             SUBJECTIVE STATEMENT: Will have his speech therapy eval after PT today.   Pt accompanied by: self  PERTINENT HISTORY: PMH: PD, MCI, trigeminal neuralgia, LBP   Some dyskinesia  mild dyskinesia in the R leg and bilateral UE/hands   PAIN:  Are you having pain? No Reports R knee is acting up a little bit   PRECAUTIONS: None  RED FLAGS: None   WEIGHT BEARING  RESTRICTIONS: No  FALLS: Has patient fallen in last 6 months? No  LIVING ENVIRONMENT: Lives with: lives with their spouse Lives in: House/apartment Stairs: Yes: Internal:  13 steps; on right going up and has a regular flight of stairs to get to the gym, but otherwise does not need to do steps  and External: 3 steps; on right going up Has following equipment at home: None  PLOF: Independent, Vocation/Vocational requirements: Retired, special educational needs teacher for about 30 years , and Leisure: reading  PATIENT GOALS: Wants to maintain and not feel like he's going to fall. Improve overall strength   OBJECTIVE:  Note: Objective measures were completed at Evaluation unless otherwise noted.  COGNITION: Overall cognitive status: Within functional limits for tasks assessed   SENSATION: Light touch: WFL  COORDINATION: Heel to shin: WNL bilaterally   POSTURE: rounded shoulders and forward head  LOWER EXTREMITY ROM:    Decr hamstring ROM with LLE   LOWER EXTREMITY MMT:    MMT Right Eval Left Eval  Hip flexion 4+ 4+  Hip extension    Hip abduction    Hip adduction    Hip internal rotation    Hip external rotation    Knee flexion 5 4+  Knee extension 5 4+  Ankle dorsiflexion 5 5  Ankle plantarflexion    Ankle inversion    Ankle eversion    (Blank rows = not tested)  BED MOBILITY:  Pt reports difficulties getting in the bed, has a tough time pressing with his R hand/knuckle.   TRANSFERS: Assistive device utilized: None  Sit to stand: SBA Stand to sit: SBA Performs with no UE support    GAIT: Gait pattern: decreased arm swing- Right, decreased arm swing- Left, decreased stride length, and decreased trunk rotation Distance walked: Clinic distances  Assistive device utilized: None Level of assistance: Modified independence and SBA Comments: Noted freezing with turns, when pt was going to turn to the R, and noted pt otherwise with more shuffled steps when turning   Pt noting  some festination at home with incr anterior weight shift   FUNCTIONAL TESTS:  5 times sit to stand: 14.7 seconds with no UE support  10 meter walk test: 9.7 seconds = 3.38 ft/sec                                                                                                                                TREATMENT:    Ther Ex: SciFit with BUE/BLE Multi-Peaks at Gear 5.5 mph for 8 minutes for incr amplitude of stepping, neural priming, reciprocal movement patterns. Pt reporting RPE as 6/10   NMR:  Standing on blue mat with 6 blaze pods in a circle to work on turning, performed on random single hit setting. Working on weight shifting, taking a big step when turning with focus on foot clearance and leading with outside foot, and trying to alternate between legs when tapping blaze pod  Performed 1 bout of 1 minute: 16 hits Performed 2 bouts of 1 minute: 13 hits, 14 hits (performing cognitive challenge with naming foods in alphabetical order from A-Z) Pt  needing intermittent cues on what food to name  On rockerboard in A/P direction: Alternating SLS taps to 2 cones 15 reps each side to work on weight shifting, SLS stability, pt needing intermittent UE support, more of a challenge with standing on RLE  Alternating forward step up with contralateral march for dynamic SLS, performed 10 reps each side, pt challenged holding SLS for more than 1-2 seconds  On blue compliant mat: Alternating forward and posterior step (skipping through midline with coordinated UE movements with PWR Up stepping forwards and extending arms out when stepping backwards), 12 reps each side, pt able to gradually incr step length with incr reps. Cues for opening up hands with UE movements  On blue foam beam: Standing and slamming 6# medicine ball to floor and catching it for larger amplitude movements and coordination, performed 10 reps total  Side stepping to R and L, and tossing and catching 6# medicine ball, performed approx  12 reps, pt reporting 7-8/10 RPE afterwards    PATIENT EDUCATION: Education details: PD community resources handout, continue HEP  Person educated: Patient Education method: Programmer, Multimedia, Facilities Manager, Verbal cues, and Handouts Education comprehension: verbalized understanding, returned demonstration, and verbal cues required  HOME EXERCISE PROGRAM: Standing PWR Up, Rock, Twist  Access Code: [email or web address] URL: [email or web address] Date: 10/11/2023 Prepared by: Marlon Plaster  Exercises - Step Forward with Arms Reaching to Sides  - 1 x daily - 7 x weekly - 3 sets - 10 reps - Standing Reach to Opposite Side with Weight Shift  - 1 x daily - 7 x weekly - 3 sets - 10 reps  GOALS: Goals reviewed with patient? Yes  SHORT TERM GOALS: ALL STGS = LTGS  LONG TERM GOALS: Target date: 11/03/2023  Pt will be independent with final PD specific HEP for gait, balance, functional strength.  Baseline:  Goal status: INITIAL  2.  Pt will improve miniBEST to at least a 25/28 in order to demo decr fall risk.  Baseline: 22/28 Goal status: INITIAL  3.  Pt will improve 5x sit<>stand to less than or equal to 12.5 sec to demonstrate improved functional strength and transfer efficiency.  Baseline: 14.7 seconds with no UE support Goal status: INITIAL  4.  Pt will verbalize understanding of local Parkinson's disease resources, including options for continued community fitness after D/C.   Baseline:  Goal status: INITIAL   ASSESSMENT:  CLINICAL IMPRESSION: Today's skilled session focused on turning strategies, stepping strategies, SLS stability, and balance on compliant surfaces. Pt challenged by SLS tasks on RLE. Pt did well with turning with taking larger steps, but did have difficulty with addition of a cognitive dual task with pt slowing down movement. Will continue per POC.    OBJECTIVE IMPAIRMENTS: Abnormal gait, decreased activity tolerance, decreased balance, decreased  coordination, impaired flexibility, and postural dysfunction.   ACTIVITY LIMITATIONS: bed mobility and locomotion level  PARTICIPATION LIMITATIONS:  N/A  PERSONAL FACTORS: Age, Behavior pattern, Past/current experiences, Time since onset of injury/illness/exacerbation, and 3+ comorbidities: PD, MCI, trigeminal neuralgia, LBP   are also affecting patient's functional outcome.   REHAB POTENTIAL: Excellent  CLINICAL DECISION MAKING: Stable/uncomplicated  EVALUATION COMPLEXITY: Low  PLAN:  PT FREQUENCY: 2x/week  PT DURATION: 8 weeks - anticipate just 4 weeks   PLANNED INTERVENTIONS: [id number]- PT Re-evaluation, 97110-Therapeutic exercises, 97530- Therapeutic activity, [id number]- Neuromuscular re-education, 97535- Self Care, [id number]- Manual therapy, [phone]- Gait training, Patient/Family education, Balance training, and Stair training  PLAN FOR NEXT SESSION: standing PWR moves (review as needed, try on  unlevel surfaces), stepping strategies!, functional strength. turning strategies, and strategies to help with freezing. High level balance tasks. Gait on treadmill (ball kicks, speed intervals, inclines), turning on treadmill  Try rebounder    [place] LOISE Senate, PT, DPT 10/18/2023, 9:31 AM

## 2023-10-20 ENCOUNTER — Ambulatory Visit: Payer: Medicare HMO | Admitting: Physical Therapy

## 2023-10-21 ENCOUNTER — Ambulatory Visit: Payer: Medicare HMO | Admitting: Physical Therapy

## 2023-10-21 ENCOUNTER — Encounter: Payer: Self-pay | Admitting: Physical Therapy

## 2023-10-21 DIAGNOSIS — R2689 Other abnormalities of gait and mobility: Secondary | ICD-10-CM

## 2023-10-21 DIAGNOSIS — R293 Abnormal posture: Secondary | ICD-10-CM

## 2023-10-21 DIAGNOSIS — R29818 Other symptoms and signs involving the nervous system: Secondary | ICD-10-CM | POA: Diagnosis not present

## 2023-10-21 DIAGNOSIS — R2681 Unsteadiness on feet: Secondary | ICD-10-CM

## 2023-10-21 DIAGNOSIS — R41841 Cognitive communication deficit: Secondary | ICD-10-CM | POA: Diagnosis not present

## 2023-10-21 DIAGNOSIS — M6281 Muscle weakness (generalized): Secondary | ICD-10-CM | POA: Diagnosis not present

## 2023-10-21 DIAGNOSIS — R471 Dysarthria and anarthria: Secondary | ICD-10-CM | POA: Diagnosis not present

## 2023-10-21 NOTE — Therapy (Signed)
 OUTPATIENT PHYSICAL THERAPY NEURO TREATMENT   Patient Name: William [name] MRN: [id number] DOB:1951-03-17, 73 y.o., male Today's Date: 10/21/2023   PCP: Garald Karlynn GAILS, MD   REFERRING PROVIDER: Evonnie Asberry RAMAN, DO  END OF SESSION:  PT End of Session - 10/21/23 0806     Visit Number 5    Number of Visits 9    Date for PT Re-Evaluation 12/05/23    PT Start Time 0804    PT Stop Time 0844    PT Time Calculation (min) 40 min    Equipment Utilized During Treatment Gait belt    Activity Tolerance Patient tolerated treatment well    Behavior During Therapy WFL for tasks assessed/performed              Past Medical History:  Diagnosis Date   Allergic rhinitis    Anemia, iron deficiency    Arthritis    BPH associated with nocturia    ED (erectile dysfunction)    GERD (gastroesophageal reflux disease)    Hiatal hernia    History of COVID-19 11/2020   per pt moderate symptoms that resovled   History of hypertension    followed by pcp  (per pt resolved no medication since approx 2017)   History of kidney stones    Left ureteral calculus    Parkinson disease [facility])    neurologist--- dr tat;   mild jaw dyskinesia,   gait disorder, tremors   Peyronie's disease    PONV (postoperative nausea and vomiting)    S/P dilatation of esophageal stricture    Trigeminal neuropathy    left side   Ulcerative colitis    followed by dr abran (GI)//   1992 s/p  total abdominal colectomy with ileoanal anastomosis   Wears glasses    Past Surgical History:  Procedure Laterality Date   CRANIECTOMY Left 09/16/2022   Procedure: RETROSIGMOID CRANI FOR MICROVASCULAR DECOMPRESSION;  Surgeon: Carollee Lani BROCKS, DO;  Location: [facility] OR;  Service: Neurosurgery;  Laterality: Left;   CYSTOSCOPY W/ URETERAL STENT PLACEMENT  2012   for ureteral stricture   CYSTOSCOPY WITH RETROGRADE PYELOGRAM, URETEROSCOPY AND STENT PLACEMENT Left 12/31/2021   Procedure: CYSTOSCOPY WITH RETROGRADE PYELOGRAM,  FLUOROSCOPIC INTERPRETATION, URETEROSCOPY  AND STENT PLACEMENT;  Surgeon: Matilda Senior, MD;  Location: [facility];  Service: Urology;  Laterality: Left;   CYSTOSCOPY/URETEROSCOPY/HOLMIUM LASER/STENT PLACEMENT  10/22/2005   @[facility] ;   w/ left ureteral balloon dilatation   HOLMIUM LASER APPLICATION Left 12/31/2021   Procedure: HOLMIUM LASER APPLICATION;  Surgeon: Matilda Senior, MD;  Location: [facility];  Service: Urology;  Laterality: Left;   INGUINAL HERNIA REPAIR     per pt unilateral  approxl 1962   KNEE ARTHROSCOPY Right 02/21/2002   @[facility]    LUMBAR LAMINECTOMY/DECOMPRESSION MICRODISCECTOMY N/A 06/28/2022   Procedure: L4-5 decompression with removal of synovial cyst;  Surgeon: Burnetta Aures, MD;  Location: [facility] OR;  Service: Orthopedics;  Laterality: N/A;  2.5 hrs 3 C-Bed   NASAL SINUS SURGERY     x2  in 2004   RECONSTRUCTION TENDON PULLEY HAND Right 05/03/2007   @[facility] ;  right long finger   TOTAL COLECTOMY  1992   W/  ILIEOANAL ANASTOMOSIS (for ilieoanal anatomatic stricture , partial bowel obstruction and required peroidic pneumatic dilatation's)   VARICOCELECTOMY     Patient Active Problem List   Diagnosis Date Noted   Pain in finger of right hand 07/13/2023   Acute bacterial bronchitis 12/23/2022   Trigeminal neuralgia 09/16/2022  Synovial cyst of lumbar facet joint 06/28/2022   Degeneration of lumbar intervertebral disc 01/06/2022   Low back pain 12/22/2021   Trigeminal neuralgia of left side of face 10/26/2021   Somatic dysfunction of right sacroiliac joint 02/18/2020   Cough 12/09/2019   Hypertensive disorder 07/23/2019   Lumbosacral spondylosis without myelopathy 05/01/2019   URI (upper respiratory infection) 01/20/2019   Pain in right knee 02/15/2018   Chest pain 05/28/2017   Hyperlipidemia 05/28/2017   Bradycardia 05/28/2017   Chest pain, atypical 05/27/2017   Lightheadedness 05/27/2017   Finger laceration 04/04/2017    Parkinson's disease (HCC) 02/26/2016   Eustachian tube dysfunction 02/06/2016   Hyponatremia 01/26/2016   Tremor 11/12/2015   Visual-spatial impairment 11/12/2015   Stress at work 10/24/2014   Nonspecific abnormal electrocardiogram (ECG) (EKG) 06/18/2014   S/P colectomy 06/18/2014   Well adult exam 06/18/2014   Renal stones 06/18/2014   Elevated BP 06/18/2014   Headache 04/25/2014   Essential hypertension, benign 04/25/2014   Serous otitis media 03/21/2014   Travel advice encounter 04/17/2013   Calcium nephrolithiasis 10/04/2012   Routine health maintenance 10/04/2012   Acute left flank pain 08/03/2011   Unspecified intestinal obstruction 12/03/2008   Other diseases of nasal cavity and sinuses(478.19) 05/30/2008   GERD 02/26/2008   PEPTIC STRICTURE 02/22/2008   HIATAL HERNIA 02/22/2008   Chest tightness or pressure 01/09/2008   ERECTILE DYSFUNCTION 04/07/2007   Allergic rhinitis 04/07/2007   COLITIS, ULCERATIVE NOS 04/07/2007   GASTRIC POLYP, HX OF 04/07/2007    ONSET DATE: 10/04/2023  REFERRING DIAG: G20.B2 (ICD-10-CM) - Parkinson's disease with dyskinesia and fluctuating manifestations (HCC)  THERAPY DIAG:  Unsteadiness on feet  Other abnormalities of gait and mobility  Other symptoms and signs involving the nervous system  Abnormal posture  Rationale for Evaluation and Treatment: Rehabilitation  SUBJECTIVE:                                                                                                                                                                                             SUBJECTIVE STATEMENT: Had a good speech therapy eval the other day.   Pt accompanied by: self  PERTINENT HISTORY: PMH: PD, MCI, trigeminal neuralgia, LBP   Some dyskinesia  mild dyskinesia in the R leg and bilateral UE/hands   PAIN:  Are you having pain? No   PRECAUTIONS: None  RED FLAGS: None   WEIGHT BEARING RESTRICTIONS: No  FALLS: Has patient fallen in  last 6 months? No  LIVING ENVIRONMENT: Lives with: lives with their spouse Lives in: House/apartment Stairs: Yes: Internal: 13 steps; on right going up and has a  regular flight of stairs to get to the gym, but otherwise does not need to do steps  and External: 3 steps; on right going up Has following equipment at home: None  PLOF: Independent, Vocation/Vocational requirements: Retired, special educational needs teacher for about 30 years , and Leisure: reading  PATIENT GOALS: Wants to maintain and not feel like he's going to fall. Improve overall strength   OBJECTIVE:  Note: Objective measures were completed at Evaluation unless otherwise noted.  COGNITION: Overall cognitive status: Within functional limits for tasks assessed   SENSATION: Light touch: WFL  COORDINATION: Heel to shin: WNL bilaterally   POSTURE: rounded shoulders and forward head  LOWER EXTREMITY ROM:    Decr hamstring ROM with LLE   LOWER EXTREMITY MMT:    MMT Right Eval Left Eval  Hip flexion 4+ 4+  Hip extension    Hip abduction    Hip adduction    Hip internal rotation    Hip external rotation    Knee flexion 5 4+  Knee extension 5 4+  Ankle dorsiflexion 5 5  Ankle plantarflexion    Ankle inversion    Ankle eversion    (Blank rows = not tested)  BED MOBILITY:  Pt reports difficulties getting in the bed, has a tough time pressing with his R hand/knuckle.   TRANSFERS: Assistive device utilized: None  Sit to stand: SBA Stand to sit: SBA Performs with no UE support    GAIT: Gait pattern: decreased arm swing- Right, decreased arm swing- Left, decreased stride length, and decreased trunk rotation Distance walked: Clinic distances  Assistive device utilized: None Level of assistance: Modified independence and SBA Comments: Noted freezing with turns, when pt was going to turn to the R, and noted pt otherwise with more shuffled steps when turning   Pt noting some festination at home with incr anterior weight  shift   FUNCTIONAL TESTS:  5 times sit to stand: 14.7 seconds with no UE support  10 meter walk test: 9.7 seconds = 3.38 ft/sec                                                                                                                                TREATMENT:     NMR:   Pt performs PWR! Moves in Standing position on air ex for 10 reps:    PWR! Up for improved posture, performed an additional 10 reps when raising up onto toes   PWR! Rock for improved weight shifting, cues to look up at hands   PWR! Twist for improved trunk rotation   PWR! Step for improved step initiation   Cues provided for looking at hands intermittently during exercises   Standing on blue foam beam with 6 blaze pods on 2 steps  (3 on first step, 3 on 2nd step) for improved balance, weight shifting, SLS stability,  performed on random hit setting and alternating between each side, cued to slow down intermittently for  balance  Performed 2 bouts of 1 minute: 26 hits, 27 hits Performed 2 bouts of 1 minute: 11 hits, 7 hits (naming animals in alphabetical order for cognitive dual task) Pt needing intermittent cues on what animal to name, intermittent cues for alphabetical order  On blue mat: Forward step with trunk rotation and slamming 6# medicine ball to floor for larger amplitude movements/coordination and catching it 12 reps each side,  pt more challenged with stepping forwards with LLE and then stepping back to midline for balance, a couple instances of min A  Retro gait with ball toss with 2nd PT 2 x 30' for coordination/stepping - cues needed when stepping backwards for wider BOS, then performed an additional 2 x 30' with PT providing random perturbations at pt's pelvis with blue band, pt demonstrating improved stepping posteriorly and even speed of movement  On rockerboard in A/P direction: 2 x 30 seconds EC, pt initially keeping weight more posteriorly onto heels, during 2nd rep, cued for bringing weight to  balls of feet with pt able to demo better balance  On black side of BOSU: 15 reps lateral weight shifting, 15 reps A/P weight shifting, cues for weight shift onto balls of feet rather than heels when shifting weight laterally   PATIENT EDUCATION: Education details: Continue HEP  Person educated: Patient Education method: Explanation Education comprehension: verbalized understanding  HOME EXERCISE PROGRAM: Standing PWR Up, [place], Twist  Access Code: [email or web address] URL: [email or web address] Date: 10/11/2023 Prepared by: Marlon Plaster  Exercises - Step Forward with Arms Reaching to Sides  - 1 x daily - 7 x weekly - 3 sets - 10 reps - Standing Reach to Opposite Side with Weight Shift  - 1 x daily - 7 x weekly - 3 sets - 10 reps  GOALS: Goals reviewed with patient? Yes  SHORT TERM GOALS: ALL STGS = LTGS  LONG TERM GOALS: Target date: 11/03/2023  Pt will be independent with final PD specific HEP for gait, balance, functional strength.  Baseline:  Goal status: INITIAL  2.  Pt will improve miniBEST to at least a 25/28 in order to demo decr fall risk.  Baseline: 22/28 Goal status: INITIAL  3.  Pt will improve 5x sit<>stand to less than or equal to 12.5 sec to demonstrate improved functional strength and transfer efficiency.  Baseline: 14.7 seconds with no UE support Goal status: INITIAL  4.  Pt will verbalize understanding of local Parkinson's disease resources, including options for continued community fitness after D/C.   Baseline:  Goal status: INITIAL   ASSESSMENT:  CLINICAL IMPRESSION: Today's skilled session focused on stepping strategies, SLS stability, weight shifting on compliant surfaces, and retro gait. With blaze pod task with SLS and added cognitive dual task, pt challenged by this and has more difficulty with balance. Pt needing intermittent cues to maintain alphabetical order. With retro gait, pt initially performing with more narrow BOS, but improved  when focusing on larger and wider steps with improved balance. Will continue per POC.    OBJECTIVE IMPAIRMENTS: Abnormal gait, decreased activity tolerance, decreased balance, decreased coordination, impaired flexibility, and postural dysfunction.   ACTIVITY LIMITATIONS: bed mobility and locomotion level  PARTICIPATION LIMITATIONS:  N/A  PERSONAL FACTORS: Age, Behavior pattern, Past/current experiences, Time since onset of injury/illness/exacerbation, and 3+ comorbidities: PD, MCI, trigeminal neuralgia, LBP   are also affecting patient's functional outcome.   REHAB POTENTIAL: Excellent  CLINICAL DECISION MAKING: Stable/uncomplicated  EVALUATION COMPLEXITY: Low  PLAN:  PT FREQUENCY: 2x/week  PT DURATION: 8 weeks -  anticipate just 4 weeks   PLANNED INTERVENTIONS: 97164- PT Re-evaluation, 97110-Therapeutic exercises, 97530- Therapeutic activity, 97112- Neuromuscular re-education, 97535- Self Care, [id number]- Manual therapy, [phone]- Gait training, Patient/Family education, Balance training, and Stair training  PLAN FOR NEXT SESSION:  stepping strategies!, functional strength. turning strategies, and strategies to help with freezing. High level balance tasks. Gait on treadmill (ball kicks, speed intervals, inclines), try rebounder/resisted gait    Sheffield LOISE Senate, PT, DPT 10/21/2023, 9:40 AM

## 2023-10-25 ENCOUNTER — Ambulatory Visit: Payer: Medicare HMO | Admitting: Physical Therapy

## 2023-10-25 DIAGNOSIS — R2689 Other abnormalities of gait and mobility: Secondary | ICD-10-CM | POA: Diagnosis not present

## 2023-10-25 DIAGNOSIS — R41841 Cognitive communication deficit: Secondary | ICD-10-CM | POA: Diagnosis not present

## 2023-10-25 DIAGNOSIS — R2681 Unsteadiness on feet: Secondary | ICD-10-CM | POA: Diagnosis not present

## 2023-10-25 DIAGNOSIS — R293 Abnormal posture: Secondary | ICD-10-CM | POA: Diagnosis not present

## 2023-10-25 DIAGNOSIS — R471 Dysarthria and anarthria: Secondary | ICD-10-CM | POA: Diagnosis not present

## 2023-10-25 DIAGNOSIS — M6281 Muscle weakness (generalized): Secondary | ICD-10-CM

## 2023-10-25 DIAGNOSIS — R29818 Other symptoms and signs involving the nervous system: Secondary | ICD-10-CM | POA: Diagnosis not present

## 2023-10-25 NOTE — Therapy (Signed)
OUTPATIENT PHYSICAL THERAPY NEURO TREATMENT   Patient Name: William [name] MRN: [id number] DOB:12-Apr-1951, 73 y.o., male Today's Date: 10/25/2023   PCP: Tresa Garter, MD   REFERRING PROVIDER: Vladimir Faster, DO  END OF SESSION:  PT End of Session - 10/25/23 0849     Visit Number 6    Number of Visits 9    Date for PT Re-Evaluation 12/05/23    PT Start Time 0847    PT Stop Time 0927    PT Time Calculation (min) 40 min    Equipment Utilized During Treatment Gait belt    Activity Tolerance Patient tolerated treatment well    Behavior During Therapy WFL for tasks assessed/performed               Past Medical History:  Diagnosis Date   Allergic rhinitis    Anemia, iron deficiency    Arthritis    BPH associated with nocturia    ED (erectile dysfunction)    GERD (gastroesophageal reflux disease)    Hiatal hernia    History of COVID-19 11/2020   per pt moderate symptoms that resovled   History of hypertension    followed by pcp  (per pt resolved no medication since approx 2017)   History of kidney stones    Left ureteral calculus    Parkinson disease [facility])    neurologist--- dr tat;   mild jaw dyskinesia,   gait disorder, tremors   Peyronie's disease    PONV (postoperative nausea and vomiting)    S/P dilatation of esophageal stricture    Trigeminal neuropathy    left side   Ulcerative colitis    followed by dr Marina Goodell (GI)//   1992 s/p  total abdominal colectomy with ileoanal anastomosis   Wears glasses    Past Surgical History:  Procedure Laterality Date   CRANIECTOMY Left 09/16/2022   Procedure: RETROSIGMOID CRANI FOR MICROVASCULAR DECOMPRESSION;  Surgeon: Bethann Goo, DO;  Location: [facility] OR;  Service: Neurosurgery;  Laterality: Left;   CYSTOSCOPY W/ URETERAL STENT PLACEMENT  2012   for ureteral stricture   CYSTOSCOPY WITH RETROGRADE PYELOGRAM, URETEROSCOPY AND STENT PLACEMENT Left 12/31/2021   Procedure: CYSTOSCOPY WITH RETROGRADE PYELOGRAM,  FLUOROSCOPIC INTERPRETATION, URETEROSCOPY  AND STENT PLACEMENT;  Surgeon: Marcine Matar, MD;  Location: [facility];  Service: Urology;  Laterality: Left;   CYSTOSCOPY/URETEROSCOPY/HOLMIUM LASER/STENT PLACEMENT  10/22/2005   @[facility] ;   w/ left ureteral balloon dilatation   HOLMIUM LASER APPLICATION Left 12/31/2021   Procedure: HOLMIUM LASER APPLICATION;  Surgeon: Marcine Matar, MD;  Location: [facility];  Service: Urology;  Laterality: Left;   INGUINAL HERNIA REPAIR     per pt unilateral  approxl 1962   KNEE ARTHROSCOPY Right 02/21/2002   @[facility]    LUMBAR LAMINECTOMY/DECOMPRESSION MICRODISCECTOMY N/A 06/28/2022   Procedure: L4-5 decompression with removal of synovial cyst;  Surgeon: Venita Lick, MD;  Location: [facility] OR;  Service: Orthopedics;  Laterality: N/A;  2.5 hrs 3 C-Bed   NASAL SINUS SURGERY     x2  in 2004   RECONSTRUCTION TENDON PULLEY HAND Right 05/03/2007   @[facility] ;  right long finger   TOTAL COLECTOMY  1992   W/  ILIEOANAL ANASTOMOSIS (for ilieoanal anatomatic stricture , partial bowel obstruction and required peroidic pneumatic dilatation's)   VARICOCELECTOMY     Patient Active Problem List   Diagnosis Date Noted   Pain in finger of right hand 07/13/2023   Acute bacterial bronchitis 12/23/2022   Trigeminal neuralgia 09/16/2022  Synovial cyst of lumbar facet joint 06/28/2022   Degeneration of lumbar intervertebral disc 01/06/2022   Low back pain 12/22/2021   Trigeminal neuralgia of left side of face 10/26/2021   Somatic dysfunction of right sacroiliac joint 02/18/2020   Cough 12/09/2019   Hypertensive disorder 07/23/2019   Lumbosacral spondylosis without myelopathy 05/01/2019   URI (upper respiratory infection) 01/20/2019   Pain in right knee 02/15/2018   Chest pain 05/28/2017   Hyperlipidemia 05/28/2017   Bradycardia 05/28/2017   Chest pain, atypical 05/27/2017   Lightheadedness 05/27/2017   Finger laceration 04/04/2017    Parkinson's disease (HCC) 02/26/2016   Eustachian tube dysfunction 02/06/2016   Hyponatremia 01/26/2016   Tremor 11/12/2015   Visual-spatial impairment 11/12/2015   Stress at work 10/24/2014   Nonspecific abnormal electrocardiogram (ECG) (EKG) 06/18/2014   S/P colectomy 06/18/2014   Well adult exam 06/18/2014   Renal stones 06/18/2014   Elevated BP 06/18/2014   Headache 04/25/2014   Essential hypertension, benign 04/25/2014   Serous otitis media 03/21/2014   Travel advice encounter 04/17/2013   Calcium nephrolithiasis 10/04/2012   Routine health maintenance 10/04/2012   Acute left flank pain 08/03/2011   Unspecified intestinal obstruction 12/03/2008   Other diseases of nasal cavity and sinuses(478.19) 05/30/2008   GERD 02/26/2008   PEPTIC STRICTURE 02/22/2008   HIATAL HERNIA 02/22/2008   Chest tightness or pressure 01/09/2008   ERECTILE DYSFUNCTION 04/07/2007   Allergic rhinitis 04/07/2007   COLITIS, ULCERATIVE NOS 04/07/2007   GASTRIC POLYP, HX OF 04/07/2007    ONSET DATE: 10/04/2023  REFERRING DIAG: G20.B2 (ICD-10-CM) - Parkinson's disease with dyskinesia and fluctuating manifestations (HCC)  THERAPY DIAG:  Unsteadiness on feet  Other abnormalities of gait and mobility  Muscle weakness (generalized)  Rationale for Evaluation and Treatment: Rehabilitation  SUBJECTIVE:                                                                                                                                                                                             SUBJECTIVE STATEMENT: Pt reports doing well. HEP is good, he is consistent with it. Has not been using the treadmill much. No falls.    Pt accompanied by: self  PERTINENT HISTORY: PMH: PD, MCI, trigeminal neuralgia, LBP   Some dyskinesia  mild dyskinesia in the R leg and bilateral UE/hands   PAIN:  Are you having pain? No   PRECAUTIONS: None  RED FLAGS: None   WEIGHT BEARING RESTRICTIONS: No  FALLS:  Has patient fallen in last 6 months? No  LIVING ENVIRONMENT: Lives with: lives with their spouse Lives in: House/apartment Stairs: Yes: Internal: 13 steps; on right  going up and has a regular flight of stairs to get to the gym, but otherwise does not need to do steps  and External: 3 steps; on right going up Has following equipment at home: None  PLOF: Independent, Vocation/Vocational requirements: Retired, Special educational needs teacher for about 30 years , and Leisure: reading  PATIENT GOALS: Wants to maintain and not feel like he's going to fall. Improve overall strength   OBJECTIVE:  Note: Objective measures were completed at Evaluation unless otherwise noted.  COGNITION: Overall cognitive status: Within functional limits for tasks assessed   SENSATION: Light touch: WFL  COORDINATION: Heel to shin: WNL bilaterally   POSTURE: rounded shoulders and forward head  LOWER EXTREMITY ROM:    Decr hamstring ROM with LLE   LOWER EXTREMITY MMT:    MMT Right Eval Left Eval  Hip flexion 4+ 4+  Hip extension    Hip abduction    Hip adduction    Hip internal rotation    Hip external rotation    Knee flexion 5 4+  Knee extension 5 4+  Ankle dorsiflexion 5 5  Ankle plantarflexion    Ankle inversion    Ankle eversion    (Blank rows = not tested)  BED MOBILITY:  Pt reports difficulties getting in the bed, has a tough time pressing with his R hand/knuckle.   TRANSFERS: Assistive device utilized: None  Sit to stand: SBA Stand to sit: SBA Performs with no UE support    GAIT: Gait pattern: decreased arm swing- Right, decreased arm swing- Left, decreased stride length, and decreased trunk rotation Distance walked: Clinic distances  Assistive device utilized: None Level of assistance: Modified independence and SBA Comments: Noted freezing with turns, when pt was going to turn to the R, and noted pt otherwise with more shuffled steps when turning   Pt noting some festination at home with  incr anterior weight shift   FUNCTIONAL TESTS:  5 times sit to stand: 14.7 seconds with no UE support  10 meter walk test: 9.7 seconds = 3.38 ft/sec                                                                                                                                TREATMENT:    Ther Act  SciFit multi-peaks level 6.5 for 8 minutes using BUE/BLEs for neural priming for reciprocal movement, dynamic cardiovascular warmup and increased amplitude of stepping. RPE of 7/10 following activity. Pain rated knee pain/fatigue as a 3/10 following activity, states he needs to walk prior to the bike due to "bad knees".   NMR  In // bars, PWR up on rocker board in A/P direction (provided by [employer] certified therapist), x15 reps, for improved postural control. Pt required CGA and single instance of min A due to anterior LOB. No posterior instability noted this date.  At rebounder, alt fwd step w/rotation and 2kg ball throw/catch, x10 per side per direction, for improved step clearance,  anticipatory balance strategies and coordination. Pt required CGA-min A throughout and had significant difficulty when stepping w/ RLE. Cued pt to take smaller step and wait to rotate towards rebounder until he is stable, which did improve balance. RPE of 7-8/10 following activity.  5 Blaze pods placed in horizontal line on mirror on random reach setting for improved cog dual-tasking, step clearance, reaching out of BOS and reactive balance.  Performed on 2 minute intervals with 2 minute rest periods.  Pt requires CGA guarding. Round 1:  while standing on blue balance beam, tasked pt to name a food when pod was blue and an animal when pod was orange.  19 hits. Round 2:  on blue balance beam, tasked pt w/solving math equations w/each pod tap.  23 hits. Notable errors/deficits:  Pt more challenged w/naming objects than solving math equations. Increased difficulty tapping pod placed superolaterally to R side. Single anterior LOB  episode noted.    PATIENT EDUCATION: Education details: Continue HEP  Person educated: Patient Education method: Explanation Education comprehension: verbalized understanding  HOME EXERCISE PROGRAM: Standing PWR Up, [place], Twist  Access Code: [email or web address] URL: [email or web address] Date: 10/11/2023 Prepared by: Alethia Berthold Amelio Brosky  Exercises - Step Forward with Arms Reaching to Sides  - 1 x daily - 7 x weekly - 3 sets - 10 reps - Standing Reach to Opposite Side with Weight Shift  - 1 x daily - 7 x weekly - 3 sets - 10 reps  GOALS: Goals reviewed with patient? Yes  SHORT TERM GOALS: ALL STGS = LTGS  LONG TERM GOALS: Target date: 11/03/2023  Pt will be independent with final PD specific HEP for gait, balance, functional strength.  Baseline:  Goal status: INITIAL  2.  Pt will improve miniBEST to at least a 25/28 in order to demo decr fall risk.  Baseline: 22/28 Goal status: INITIAL  3.  Pt will improve 5x sit<>stand to less than or equal to 12.5 sec to demonstrate improved functional strength and transfer efficiency.  Baseline: 14.7 seconds with no UE support Goal status: INITIAL  4.  Pt will verbalize understanding of local Parkinson's disease resources, including options for continued community fitness after D/C.   Baseline:  Goal status: INITIAL   ASSESSMENT:  CLINICAL IMPRESSION: Emphasis of skilled PT session on postural control, anticipatory and reactive balance strategies and dual-tasking. Pt tolerated session well but did report R knee pain/discomfort following scifit and required short rest breaks in between tasks due to R knee. Pt had significant difficulty stepping fwd w/RLE and stabilizing on leg to throw ball to rebounder, requiring min A to stabilize. Pt also challenged by naming objects for dual-task vs solving math equations. Continue POC.   OBJECTIVE IMPAIRMENTS: Abnormal gait, decreased activity tolerance, decreased balance, decreased  coordination, impaired flexibility, and postural dysfunction.   ACTIVITY LIMITATIONS: bed mobility and locomotion level  PARTICIPATION LIMITATIONS:  N/A  PERSONAL FACTORS: Age, Behavior pattern, Past/current experiences, Time since onset of injury/illness/exacerbation, and 3+ comorbidities: PD, MCI, trigeminal neuralgia, LBP   are also affecting patient's functional outcome.   REHAB POTENTIAL: Excellent  CLINICAL DECISION MAKING: Stable/uncomplicated  EVALUATION COMPLEXITY: Low  PLAN:  PT FREQUENCY: 2x/week  PT DURATION: 8 weeks - anticipate just 4 weeks   PLANNED INTERVENTIONS: 97164- PT Re-evaluation, 97110-Therapeutic exercises, 97530- Therapeutic activity, 97112- Neuromuscular re-education, 97535- Self Care, 16109- Manual therapy, [id number]- Gait training, Patient/Family education, Balance training, and Stair training  PLAN FOR NEXT SESSION:  stepping strategies!, functional strength. turning strategies, and strategies to help with  freezing. High level balance tasks. Gait on treadmill (ball kicks, speed intervals, inclines), try rebounder/resisted gait, resisted step ups    [place] Alexa E Wardell Pokorski, PT, DPT 10/25/2023, 9:28 AM

## 2023-10-27 ENCOUNTER — Ambulatory Visit: Payer: Medicare HMO | Admitting: Physical Therapy

## 2023-10-27 VITALS — BP 119/72 | HR 69

## 2023-10-27 DIAGNOSIS — R2689 Other abnormalities of gait and mobility: Secondary | ICD-10-CM

## 2023-10-27 DIAGNOSIS — R41841 Cognitive communication deficit: Secondary | ICD-10-CM | POA: Diagnosis not present

## 2023-10-27 DIAGNOSIS — M6281 Muscle weakness (generalized): Secondary | ICD-10-CM | POA: Diagnosis not present

## 2023-10-27 DIAGNOSIS — R2681 Unsteadiness on feet: Secondary | ICD-10-CM

## 2023-10-27 DIAGNOSIS — R471 Dysarthria and anarthria: Secondary | ICD-10-CM | POA: Diagnosis not present

## 2023-10-27 DIAGNOSIS — R293 Abnormal posture: Secondary | ICD-10-CM | POA: Diagnosis not present

## 2023-10-27 DIAGNOSIS — R29818 Other symptoms and signs involving the nervous system: Secondary | ICD-10-CM | POA: Diagnosis not present

## 2023-10-27 NOTE — Therapy (Signed)
OUTPATIENT PHYSICAL THERAPY NEURO TREATMENT   Patient Name: William [name] MRN: [id number] DOB:07/08/1951, 73 y.o., male Today's Date: 10/27/2023   PCP: Tresa Garter, MD   REFERRING PROVIDER: Vladimir Faster, DO  END OF SESSION:  PT End of Session - 10/27/23 0850     Visit Number 7    Number of Visits 9    Date for PT Re-Evaluation 12/05/23    PT Start Time 0848    PT Stop Time 0927    PT Time Calculation (min) 39 min    Equipment Utilized During Treatment --    Activity Tolerance Patient tolerated treatment well    Behavior During Therapy [facility] for tasks assessed/performed                Past Medical History:  Diagnosis Date   Allergic rhinitis    Anemia, iron deficiency    Arthritis    BPH associated with nocturia    ED (erectile dysfunction)    GERD (gastroesophageal reflux disease)    Hiatal hernia    History of COVID-19 11/2020   per pt moderate symptoms that resovled   History of hypertension    followed by pcp  (per pt resolved no medication since approx 2017)   History of kidney stones    Left ureteral calculus    Parkinson disease [facility])    neurologist--- dr tat;   mild jaw dyskinesia,   gait disorder, tremors   Peyronie's disease    PONV (postoperative nausea and vomiting)    S/P dilatation of esophageal stricture    Trigeminal neuropathy    left side   Ulcerative colitis    followed by dr Marina Goodell (GI)//   1992 s/p  total abdominal colectomy with ileoanal anastomosis   Wears glasses    Past Surgical History:  Procedure Laterality Date   CRANIECTOMY Left 09/16/2022   Procedure: RETROSIGMOID CRANI FOR MICROVASCULAR DECOMPRESSION;  Surgeon: Bethann Goo, DO;  Location: [facility] OR;  Service: Neurosurgery;  Laterality: Left;   CYSTOSCOPY W/ URETERAL STENT PLACEMENT  2012   for ureteral stricture   CYSTOSCOPY WITH RETROGRADE PYELOGRAM, URETEROSCOPY AND STENT PLACEMENT Left 12/31/2021   Procedure: CYSTOSCOPY WITH RETROGRADE PYELOGRAM, FLUOROSCOPIC  INTERPRETATION, URETEROSCOPY  AND STENT PLACEMENT;  Surgeon: Marcine Matar, MD;  Location: [facility];  Service: Urology;  Laterality: Left;   CYSTOSCOPY/URETEROSCOPY/HOLMIUM LASER/STENT PLACEMENT  10/22/2005   @[facility] ;   w/ left ureteral balloon dilatation   HOLMIUM LASER APPLICATION Left 12/31/2021   Procedure: HOLMIUM LASER APPLICATION;  Surgeon: Marcine Matar, MD;  Location: [facility];  Service: Urology;  Laterality: Left;   INGUINAL HERNIA REPAIR     per pt unilateral  approxl 1962   KNEE ARTHROSCOPY Right 02/21/2002   @[facility]    LUMBAR LAMINECTOMY/DECOMPRESSION MICRODISCECTOMY N/A 06/28/2022   Procedure: L4-5 decompression with removal of synovial cyst;  Surgeon: Venita Lick, MD;  Location: [facility] OR;  Service: Orthopedics;  Laterality: N/A;  2.5 hrs 3 C-Bed   NASAL SINUS SURGERY     x2  in 2004   RECONSTRUCTION TENDON PULLEY HAND Right 05/03/2007   @[facility] ;  right long finger   TOTAL COLECTOMY  1992   W/  ILIEOANAL ANASTOMOSIS (for ilieoanal anatomatic stricture , partial bowel obstruction and required peroidic pneumatic dilatation's)   VARICOCELECTOMY     Patient Active Problem List   Diagnosis Date Noted   Pain in finger of right hand 07/13/2023   Acute bacterial bronchitis 12/23/2022   Trigeminal neuralgia 09/16/2022  Synovial cyst of lumbar facet joint 06/28/2022   Degeneration of lumbar intervertebral disc 01/06/2022   Low back pain 12/22/2021   Trigeminal neuralgia of left side of face 10/26/2021   Somatic dysfunction of right sacroiliac joint 02/18/2020   Cough 12/09/2019   Hypertensive disorder 07/23/2019   Lumbosacral spondylosis without myelopathy 05/01/2019   URI (upper respiratory infection) 01/20/2019   Pain in right knee 02/15/2018   Chest pain 05/28/2017   Hyperlipidemia 05/28/2017   Bradycardia 05/28/2017   Chest pain, atypical 05/27/2017   Lightheadedness 05/27/2017   Finger laceration 04/04/2017   Parkinson's disease  (HCC) 02/26/2016   Eustachian tube dysfunction 02/06/2016   Hyponatremia 01/26/2016   Tremor 11/12/2015   Visual-spatial impairment 11/12/2015   Stress at work 10/24/2014   Nonspecific abnormal electrocardiogram (ECG) (EKG) 06/18/2014   S/P colectomy 06/18/2014   Well adult exam 06/18/2014   Renal stones 06/18/2014   Elevated BP 06/18/2014   Headache 04/25/2014   Essential hypertension, benign 04/25/2014   Serous otitis media 03/21/2014   Travel advice encounter 04/17/2013   Calcium nephrolithiasis 10/04/2012   Routine health maintenance 10/04/2012   Acute left flank pain 08/03/2011   Unspecified intestinal obstruction 12/03/2008   Other diseases of nasal cavity and sinuses(478.19) 05/30/2008   GERD 02/26/2008   PEPTIC STRICTURE 02/22/2008   HIATAL HERNIA 02/22/2008   Chest tightness or pressure 01/09/2008   ERECTILE DYSFUNCTION 04/07/2007   Allergic rhinitis 04/07/2007   COLITIS, ULCERATIVE NOS 04/07/2007   GASTRIC POLYP, HX OF 04/07/2007    ONSET DATE: 10/04/2023  REFERRING DIAG: G20.B2 (ICD-10-CM) - Parkinson's disease with dyskinesia and fluctuating manifestations (HCC)  THERAPY DIAG:  Unsteadiness on feet  Other abnormalities of gait and mobility  Muscle weakness (generalized)  Rationale for Evaluation and Treatment: Rehabilitation  SUBJECTIVE:                                                                                                                                                                                             SUBJECTIVE STATEMENT: Pt reports doing okay. Is fatigued today. Was "pretty beat up" after last session. Having some right knee pain today. No falls.   Pt accompanied by: self  PERTINENT HISTORY: PMH: PD, MCI, trigeminal neuralgia, LBP   Some dyskinesia  mild dyskinesia in the R leg and bilateral UE/hands   PAIN:  Are you having pain? Yes: NPRS scale: 4/10 Pain location: R knee Pain description: achy    PRECAUTIONS:  None  RED FLAGS: None   WEIGHT BEARING RESTRICTIONS: No  FALLS: Has patient fallen in last 6 months? No  LIVING ENVIRONMENT: Lives with: lives with their  spouse Lives in: House/apartment Stairs: Yes: Internal: 13 steps; on right going up and has a regular flight of stairs to get to the gym, but otherwise does not need to do steps  and External: 3 steps; on right going up Has following equipment at home: None  PLOF: Independent, Vocation/Vocational requirements: Retired, Special educational needs teacher for about 30 years , and Leisure: reading  PATIENT GOALS: Wants to maintain and not feel like he's going to fall. Improve overall strength   OBJECTIVE:  Note: Objective measures were completed at Evaluation unless otherwise noted.  COGNITION: Overall cognitive status: Within functional limits for tasks assessed   SENSATION: Light touch: WFL  COORDINATION: Heel to shin: WNL bilaterally   POSTURE: rounded shoulders and forward head  LOWER EXTREMITY ROM:    Decr hamstring ROM with LLE   LOWER EXTREMITY MMT:    MMT Right Eval Left Eval  Hip flexion 4+ 4+  Hip extension    Hip abduction    Hip adduction    Hip internal rotation    Hip external rotation    Knee flexion 5 4+  Knee extension 5 4+  Ankle dorsiflexion 5 5  Ankle plantarflexion    Ankle inversion    Ankle eversion    (Blank rows = not tested)  BED MOBILITY:  Pt reports difficulties getting in the bed, has a tough time pressing with his R hand/knuckle.   TRANSFERS: Assistive device utilized: None  Sit to stand: SBA Stand to sit: SBA Performs with no UE support    GAIT: Gait pattern: decreased arm swing- Right, decreased arm swing- Left, decreased stride length, and decreased trunk rotation Distance walked: Clinic distances  Assistive device utilized: None Level of assistance: Modified independence and SBA Comments: Noted freezing with turns, when pt was going to turn to the R, and noted pt otherwise with more  shuffled steps when turning   Pt noting some festination at home with incr anterior weight shift   FUNCTIONAL TESTS:  5 times sit to stand: 14.7 seconds with no UE support  10 meter walk test: 9.7 seconds = 3.38 ft/sec   VITALS  Vitals:   10/27/23 0920 10/27/23 0926  BP: 115/71 119/72  Pulse: 74 69                                                                                                                                TREATMENT:    Ther Ex  Leg presses for improved quad strength to alleviate knee discomfort:  Double leg x15 reps at 60#. Min cues to avoid knee hyperextension at top of rep  Single leg x12 reps at 40#. Increased difficulty performing on RLE> LLE.  Pt reported decreased knee pain following activity   Ther Act  Pt performed floor transfer mod I to red floor mat and performed the following exercises for improved posterior chain strength, truncal rotation and A/P weight shift: Mini squats w/BUE support on mat,  x15 reps.  Child's pose to table top pose flow, x12 reps. Cues for cervical extension in table top for added thoracic extension mobility  Quadruped thread the needles x10 per side. Increased difficulty performing w/LUE due to decreased stability on RUE  Pt performed tall kneel to stand transfer mod I from floor mat  On mat table, quadruped bird dogs, x9 reps per side, for improved posterior chain strength, reciprocal coordination and core stability. Noted significant difficulty stabilizing on RLE, but pt reported activity was easy.   NMR  In // bars for improved postural control, step clearance and ankle strategy:  On rocker board in L/R direction, standing with no UE support and using mirror for visual biofeedback on body position. Pt demonstrating significant lean to L side but compensating w/hip strategy. Pt unaware of lean to L despite mirror and cues from therapist. Pt unable to control shift to R side without losing balance into bar.   On rocker board in  A/P direction, alt retro step off board w/o UE support, x10 reps per side. Pt w/increased difficulty stepping w/LLE   Self-care/home management  Immediately following rocker board activity, pt reported being dizzy and needing to sit down. Provided pt w/chair and water and assessed vitals (see above) and WNL. Reassessed vitals and BP remained stable and pt able to ambulate out of session without instability.    PATIENT EDUCATION: Education details: Continue HEP  Person educated: Patient Education method: Explanation Education comprehension: verbalized understanding  HOME EXERCISE PROGRAM: Standing PWR Up, [place], Twist  Access Code: [email or web address] URL: [email or web address] Date: 10/11/2023 Prepared by: Alethia Berthold Neka Bise  Exercises - Step Forward with Arms Reaching to Sides  - 1 x daily - 7 x weekly - 3 sets - 10 reps - Standing Reach to Opposite Side with Weight Shift  - 1 x daily - 7 x weekly - 3 sets - 10 reps  GOALS: Goals reviewed with patient? Yes  SHORT TERM GOALS: ALL STGS = LTGS  LONG TERM GOALS: Target date: 11/03/2023  Pt will be independent with final PD specific HEP for gait, balance, functional strength.  Baseline:  Goal status: INITIAL  2.  Pt will improve miniBEST to at least a 25/28 in order to demo decr fall risk.  Baseline: 22/28 Goal status: INITIAL  3.  Pt will improve 5x sit<>stand to less than or equal to 12.5 sec to demonstrate improved functional strength and transfer efficiency.  Baseline: 14.7 seconds with no UE support Goal status: INITIAL  4.  Pt will verbalize understanding of local Parkinson's disease resources, including options for continued community fitness after D/C.   Baseline:  Goal status: INITIAL   ASSESSMENT:  CLINICAL IMPRESSION: Emphasis of skilled PT session on spinal mobility, safety w/floor transfers, postural control increased step clearance. Pt continues to be limited by R knee pain but tolerated session well. Pt  demonstrates lateral lean preference to L side that pt not aware of even w/verbal and visual cues. Pt did become dizzy w/exertion at end of session, but vitals WNL and pt did recover w/seated rest and water. Continue POC.   OBJECTIVE IMPAIRMENTS: Abnormal gait, decreased activity tolerance, decreased balance, decreased coordination, impaired flexibility, and postural dysfunction.   ACTIVITY LIMITATIONS: bed mobility and locomotion level  PARTICIPATION LIMITATIONS:  N/A  PERSONAL FACTORS: Age, Behavior pattern, Past/current experiences, Time since onset of injury/illness/exacerbation, and 3+ comorbidities: PD, MCI, trigeminal neuralgia, LBP   are also affecting patient's functional outcome.   REHAB POTENTIAL: Excellent  CLINICAL DECISION MAKING:  Stable/uncomplicated  EVALUATION COMPLEXITY: Low  PLAN:  PT FREQUENCY: 2x/week  PT DURATION: 8 weeks - anticipate just 4 weeks   PLANNED INTERVENTIONS: 97164- PT Re-evaluation, 97110-Therapeutic exercises, 97530- Therapeutic activity, 97112- Neuromuscular re-education, 97535- Self Care, 82956- Manual therapy, [phone]- Gait training, Patient/Family education, Balance training, and Stair training  PLAN FOR NEXT SESSION:  stepping strategies!, functional strength. turning strategies, and strategies to help with freezing. High level balance tasks. Gait on treadmill (ball kicks, speed intervals, inclines), try rebounder/resisted gait, resisted step ups, lateral weight shift    Jill Alexanders Shaliyah Taite, PT, DPT 10/27/2023, 9:28 AM

## 2023-10-31 NOTE — Therapy (Unsigned)
OUTPATIENT SPEECH LANGUAGE PATHOLOGY PARKINSON'S TREATMENT   Patient Name: William [name] MRN: [id number] DOB:November 16, 1950, 73 y.o., male Today's Date: 11/01/2023  PCP: Tresa Garter MD REFERRING PROVIDER: Vladimir Faster, DO  END OF SESSION:  End of Session - 11/01/23 0758     Visit Number 2    Number of Visits 17    Date for SLP Re-Evaluation 12/13/23    Authorization Type [employer]    SLP Start Time 0800    SLP Stop Time  0845    SLP Time Calculation (min) 45 min    Activity Tolerance Patient tolerated treatment well              Past Medical History:  Diagnosis Date   Allergic rhinitis    Anemia, iron deficiency    Arthritis    BPH associated with nocturia    ED (erectile dysfunction)    GERD (gastroesophageal reflux disease)    Hiatal hernia    History of COVID-19 11/2020   per pt moderate symptoms that resovled   History of hypertension    followed by pcp  (per pt resolved no medication since approx 2017)   History of kidney stones    Left ureteral calculus    Parkinson disease [facility])    neurologist--- dr tat;   mild jaw dyskinesia,   gait disorder, tremors   Peyronie's disease    PONV (postoperative nausea and vomiting)    S/P dilatation of esophageal stricture    Trigeminal neuropathy    left side   Ulcerative colitis    followed by dr Marina Goodell (GI)//   1992 s/p  total abdominal colectomy with ileoanal anastomosis   Wears glasses    Past Surgical History:  Procedure Laterality Date   CRANIECTOMY Left 09/16/2022   Procedure: RETROSIGMOID CRANI FOR MICROVASCULAR DECOMPRESSION;  Surgeon: Bethann Goo, DO;  Location: [facility] OR;  Service: Neurosurgery;  Laterality: Left;   CYSTOSCOPY W/ URETERAL STENT PLACEMENT  2012   for ureteral stricture   CYSTOSCOPY WITH RETROGRADE PYELOGRAM, URETEROSCOPY AND STENT PLACEMENT Left 12/31/2021   Procedure: CYSTOSCOPY WITH RETROGRADE PYELOGRAM, FLUOROSCOPIC INTERPRETATION, URETEROSCOPY  AND STENT PLACEMENT;   Surgeon: Marcine Matar, MD;  Location: [facility];  Service: Urology;  Laterality: Left;   CYSTOSCOPY/URETEROSCOPY/HOLMIUM LASER/STENT PLACEMENT  10/22/2005   @[facility] ;   w/ left ureteral balloon dilatation   HOLMIUM LASER APPLICATION Left 12/31/2021   Procedure: HOLMIUM LASER APPLICATION;  Surgeon: Marcine Matar, MD;  Location: [facility];  Service: Urology;  Laterality: Left;   INGUINAL HERNIA REPAIR     per pt unilateral  approxl 1962   KNEE ARTHROSCOPY Right 02/21/2002   @[facility]    LUMBAR LAMINECTOMY/DECOMPRESSION MICRODISCECTOMY N/A 06/28/2022   Procedure: L4-5 decompression with removal of synovial cyst;  Surgeon: Venita Lick, MD;  Location: [facility] OR;  Service: Orthopedics;  Laterality: N/A;  2.5 hrs 3 C-Bed   NASAL SINUS SURGERY     x2  in 2004   RECONSTRUCTION TENDON PULLEY HAND Right 05/03/2007   @[facility] ;  right long finger   TOTAL COLECTOMY  1992   W/  ILIEOANAL ANASTOMOSIS (for ilieoanal anatomatic stricture , partial bowel obstruction and required peroidic pneumatic dilatation's)   VARICOCELECTOMY     Patient Active Problem List   Diagnosis Date Noted   Pain in finger of right hand 07/13/2023   Acute bacterial bronchitis 12/23/2022   Trigeminal neuralgia 09/16/2022   Synovial cyst of lumbar facet joint 06/28/2022   Degeneration of lumbar intervertebral  disc 01/06/2022   Low back pain 12/22/2021   Trigeminal neuralgia of left side of face 10/26/2021   Somatic dysfunction of right sacroiliac joint 02/18/2020   Cough 12/09/2019   Hypertensive disorder 07/23/2019   Lumbosacral spondylosis without myelopathy 05/01/2019   URI (upper respiratory infection) 01/20/2019   Pain in right knee 02/15/2018   Chest pain 05/28/2017   Hyperlipidemia 05/28/2017   Bradycardia 05/28/2017   Chest pain, atypical 05/27/2017   Lightheadedness 05/27/2017   Finger laceration 04/04/2017   Parkinson's disease (HCC) 02/26/2016   Eustachian tube dysfunction  02/06/2016   Hyponatremia 01/26/2016   Tremor 11/12/2015   Visual-spatial impairment 11/12/2015   Stress at work 10/24/2014   Nonspecific abnormal electrocardiogram (ECG) (EKG) 06/18/2014   S/P colectomy 06/18/2014   Well adult exam 06/18/2014   Renal stones 06/18/2014   Elevated BP 06/18/2014   Headache 04/25/2014   Essential hypertension, benign 04/25/2014   Serous otitis media 03/21/2014   Travel advice encounter 04/17/2013   Calcium nephrolithiasis 10/04/2012   Routine health maintenance 10/04/2012   Acute left flank pain 08/03/2011   Unspecified intestinal obstruction 12/03/2008   Other diseases of nasal cavity and sinuses(478.19) 05/30/2008   GERD 02/26/2008   PEPTIC STRICTURE 02/22/2008   HIATAL HERNIA 02/22/2008   Chest tightness or pressure 01/09/2008   ERECTILE DYSFUNCTION 04/07/2007   Allergic rhinitis 04/07/2007   COLITIS, ULCERATIVE NOS 04/07/2007   GASTRIC POLYP, HX OF 04/07/2007    ONSET DATE: 10/11/23 (referral date)  REFERRING DIAG:  R47.9 (ICD-10-CM) - Difficulty with speech  G20.B2 (ICD-10-CM) - Parkinson's disease with dyskinesia and fluctuating manifestations   THERAPY DIAG:  Dysarthria and anarthria  Rationale for Evaluation and Treatment: Rehabilitation  SUBJECTIVE:   SUBJECTIVE STATEMENT: "It was informative" re: PD video Pt accompanied by: self  PERTINENT HISTORY: PMH: PD (2017), MCI, trigeminal neuralgia, LBP Now experiencing changes in balance and communication, including word finding   PAIN:  Are you having pain? Yes: NPRS scale: 3/10 Pain location: right knee Pain description: chronic Aggravating factors: movement  Relieving factors: not moving   FALLS: Has patient fallen in last 6 months?  No  LIVING ENVIRONMENT: Lives with: lives with their spouse Lives in: House/apartment  PLOF:  Level of assistance: Independent with ADLs, Independent with IADLs Employment: Retired Designer, jewellery)  PATIENT GOALS: to improve articulation  and clarity of speech   OBJECTIVE:  Note: Objective measures were completed at Evaluation unless otherwise noted.                                                                                                                           TREATMENT DATE:  11-01-23: Returned with personal Speak Out! Workbook. Watched Parkinson's Disease video on [employer] website, which he was reported was informative. Educated and instructed dysarthria strategies (SLOP) and word finding strategies. Handout provided. Able to demo over-articulation during oral reading with rare min A. Will continue to train in upcoming sessions. Targeted improving vocal quality and increasing intensity  through progressively difficulty speech tasks using Speak Out! program, lesson #1. ST leads pt through exercises providing usual model prior to pt execution. occasional min-A required to achieve target dB this date. Averages this date:  Sustained "ah" 85 dB Reading 85 dB Conversation task 75 dB   10-18-23: Provided education re: typical communication changes and challenges related to progression on Parkinson's Disease (PD). Introduced [employer]! Program and principles, with brief review of being intentional and introduction of Parkinson Voice [employer]. Recommended watching What is PD? Video to aid patient comprehension. Ordered Speak Out! Workbook this session. Initiated education and instruction SLOP dysarthria strategies, in which pt able to demo at sentence level with occasional min A to slow rate. Updated HEP for oral reading 5-10 minutes per day.   PATIENT EDUCATION: Education details: see above Person educated: Patient Education method: Chief Technology Officer Education comprehension: verbalized understanding and needs further education  HOME EXERCISE PROGRAM: Speak Out!   GOALS: Goals reviewed with patient? Yes  SHORT TERM GOALS: Target date: 11/14/2023  Pt will complete daily HEP as recommended across 2 sessions   Baseline: Goal status: IN PROGRESS  2.  Pt will achieve targeted dB levels in demonstration of Speak Out! Lessons with 90% accuracy given rare min A over 2 sessions  Baseline:  Goal status: IN PROGRESS  3.  Pt will utilize dysarthria compensations in 10+ minute conversation to be 100% intelligible given occasional min A  Baseline:  Goal status: IN PROGRESS  4.  Pt will utilize word retrieval strategies to aid anomia in 10+ minute conversation given occasional min A  Baseline:  Goal status: IN PROGRESS    LONG TERM GOALS: Target date: 12/13/2023  Pt will complete daily HEP at least 1x/day (BID recommended) > 1 week  Baseline:  Goal status: IN PROGRESS  2.  Pt will utilize dysarthria compensations in 15+ minute conversation to optimize vocal intensity and clarity given rare min A over 2 sessions Baseline:  Goal status: IN PROGRESS  3.  Pt will reduce throat clearing by 75% by LTG date Baseline: >5 at eval Goal status: IN PROGRESS  4.  Pt will report improved communication effectiveness via PROM by 2 point improvement by last ST session  Baseline: CPIB=24 Goal status: IN PROGRESS  ASSESSMENT:  CLINICAL IMPRESSION: Patient is a 73 y.o. M who was seen today for communication changes related to progression of Parkinson's Disease. Today, pt presents with mild hypokinetic dysarthria c/b imprecise articulation and intermittent rushes in speech. Initiated education and instruction of Speak Out! Program with occasional min A required to optimize volume. Pt would benefit from skilled ST intervention to optimize communication effectiveness and maximize life participation.   OBJECTIVE IMPAIRMENTS: Objective impairments include dysarthria. These impairments are limiting patient from effectively communicating at home and in community.Factors affecting potential to achieve goals and functional outcome are  PD . Patient will benefit from skilled SLP services to address above impairments and  improve overall function.  REHAB POTENTIAL: Excellent  PLAN:  SLP FREQUENCY: 2x/week  SLP DURATION: 8 weeks - anticipate 4 weeks   PLANNED INTERVENTIONS: Language facilitation, Environmental controls, Internal/external aids, Functional tasks, Multimodal communication approach, SLP instruction and feedback, Compensatory strategies, Patient/family education, [phone] Treatment of speech (30 or 45 min) , and 60454- Speech Eval Sound Prod, Articulate, Phonological    Gracy Racer, CCC-SLP 11/01/2023, 7:59 AM

## 2023-11-01 ENCOUNTER — Ambulatory Visit: Payer: Medicare HMO | Admitting: Physical Therapy

## 2023-11-01 ENCOUNTER — Ambulatory Visit: Payer: Medicare HMO

## 2023-11-01 ENCOUNTER — Encounter: Payer: Self-pay | Admitting: Physical Therapy

## 2023-11-01 DIAGNOSIS — R41841 Cognitive communication deficit: Secondary | ICD-10-CM | POA: Diagnosis not present

## 2023-11-01 DIAGNOSIS — R2681 Unsteadiness on feet: Secondary | ICD-10-CM | POA: Diagnosis not present

## 2023-11-01 DIAGNOSIS — R2689 Other abnormalities of gait and mobility: Secondary | ICD-10-CM

## 2023-11-01 DIAGNOSIS — R29818 Other symptoms and signs involving the nervous system: Secondary | ICD-10-CM | POA: Diagnosis not present

## 2023-11-01 DIAGNOSIS — M6281 Muscle weakness (generalized): Secondary | ICD-10-CM | POA: Diagnosis not present

## 2023-11-01 DIAGNOSIS — R293 Abnormal posture: Secondary | ICD-10-CM | POA: Diagnosis not present

## 2023-11-01 DIAGNOSIS — R471 Dysarthria and anarthria: Secondary | ICD-10-CM

## 2023-11-01 NOTE — Patient Instructions (Addendum)
S=Slow L=Loud O=Over-articulate P=Pause   When you have trouble saying the word you want to say:  1)  Describe it! Describe the size, color, shape, function, composition (what it's made of), and/or location to be able to have the word come sooner, or to have your listener help you out  2) "talk around the word" (say it a totally different way) -get your point out using different words than the one/ones you can't think of  3) Use a synonym - think of another word that means the exact same thing  4) DRAW! You can draw some things you want to say in order to give your listener a hint about what you're talking about  5)  Gesture- make motions to help your listener understand what you are trying to communicate  6) Write down the word, if you can - or the first letter or letters, to help you say the word or to give your listener a hint about what you're trying to say

## 2023-11-01 NOTE — Therapy (Signed)
OUTPATIENT PHYSICAL THERAPY NEURO TREATMENT   Patient Name: William [name] MRN: [id number] DOB:[birth year], 73 y.o., male Today's Date: 11/01/2023   PCP: Tresa Garter, MD   REFERRING PROVIDER: Vladimir Faster, DO  END OF SESSION:  PT End of Session - 11/01/23 0848     Visit Number 8    Number of Visits 9    Date for PT Re-Evaluation 12/05/23    PT Start Time 0847    PT Stop Time 0927    PT Time Calculation (min) 40 min    Equipment Utilized During Treatment Gait belt    Activity Tolerance Patient tolerated treatment well    Behavior During Therapy WFL for tasks assessed/performed                Past Medical History:  Diagnosis Date   Allergic rhinitis    Anemia, iron deficiency    Arthritis    BPH associated with nocturia    ED (erectile dysfunction)    GERD (gastroesophageal reflux disease)    Hiatal hernia    History of COVID-19 11/2020   per pt moderate symptoms that resovled   History of hypertension    followed by pcp  (per pt resolved no medication since approx 2017)   History of kidney stones    Left ureteral calculus    Parkinson disease [facility])    neurologist--- dr tat;   mild jaw dyskinesia,   gait disorder, tremors   Peyronie's disease    PONV (postoperative nausea and vomiting)    S/P dilatation of esophageal stricture    Trigeminal neuropathy    left side   Ulcerative colitis    followed by dr Marina Goodell (GI)//   1992 s/p  total abdominal colectomy with ileoanal anastomosis   Wears glasses    Past Surgical History:  Procedure Laterality Date   CRANIECTOMY Left 09/16/2022   Procedure: RETROSIGMOID CRANI FOR MICROVASCULAR DECOMPRESSION;  Surgeon: Bethann Goo, DO;  Location: [facility] OR;  Service: Neurosurgery;  Laterality: Left;   CYSTOSCOPY W/ URETERAL STENT PLACEMENT  2012   for ureteral stricture   CYSTOSCOPY WITH RETROGRADE PYELOGRAM, URETEROSCOPY AND STENT PLACEMENT Left 12/31/2021   Procedure: CYSTOSCOPY WITH RETROGRADE PYELOGRAM,  FLUOROSCOPIC INTERPRETATION, URETEROSCOPY  AND STENT PLACEMENT;  Surgeon: Marcine Matar, MD;  Location: [facility];  Service: Urology;  Laterality: Left;   CYSTOSCOPY/URETEROSCOPY/HOLMIUM LASER/STENT PLACEMENT  10/22/2005   @[facility] ;   w/ left ureteral balloon dilatation   HOLMIUM LASER APPLICATION Left 12/31/2021   Procedure: HOLMIUM LASER APPLICATION;  Surgeon: Marcine Matar, MD;  Location: [facility];  Service: Urology;  Laterality: Left;   INGUINAL HERNIA REPAIR     per pt unilateral  approxl 1962   KNEE ARTHROSCOPY Right 02/21/2002   @[facility]    LUMBAR LAMINECTOMY/DECOMPRESSION MICRODISCECTOMY N/A 06/28/2022   Procedure: L4-5 decompression with removal of synovial cyst;  Surgeon: Venita Lick, MD;  Location: [facility] OR;  Service: Orthopedics;  Laterality: N/A;  2.5 hrs 3 C-Bed   NASAL SINUS SURGERY     x2  in 2004   RECONSTRUCTION TENDON PULLEY HAND Right 05/03/2007   @[facility] ;  right long finger   TOTAL COLECTOMY  1992   W/  ILIEOANAL ANASTOMOSIS (for ilieoanal anatomatic stricture , partial bowel obstruction and required peroidic pneumatic dilatation's)   VARICOCELECTOMY     Patient Active Problem List   Diagnosis Date Noted   Pain in finger of right hand 07/13/2023   Acute bacterial bronchitis 12/23/2022   Trigeminal neuralgia  09/16/2022   Synovial cyst of lumbar facet joint 06/28/2022   Degeneration of lumbar intervertebral disc 01/06/2022   Low back pain 12/22/2021   Trigeminal neuralgia of left side of face 10/26/2021   Somatic dysfunction of right sacroiliac joint 02/18/2020   Cough 12/09/2019   Hypertensive disorder 07/23/2019   Lumbosacral spondylosis without myelopathy 05/01/2019   URI (upper respiratory infection) 01/20/2019   Pain in right knee 02/15/2018   Chest pain 05/28/2017   Hyperlipidemia 05/28/2017   Bradycardia 05/28/2017   Chest pain, atypical 05/27/2017   Lightheadedness 05/27/2017   Finger laceration 04/04/2017    Parkinson's disease (HCC) 02/26/2016   Eustachian tube dysfunction 02/06/2016   Hyponatremia 01/26/2016   Tremor 11/12/2015   Visual-spatial impairment 11/12/2015   Stress at work 10/24/2014   Nonspecific abnormal electrocardiogram (ECG) (EKG) 06/18/2014   S/P colectomy 06/18/2014   Well adult exam 06/18/2014   Renal stones 06/18/2014   Elevated BP 06/18/2014   Headache 04/25/2014   Essential hypertension, benign 04/25/2014   Serous otitis media 03/21/2014   Travel advice encounter 04/17/2013   Calcium nephrolithiasis 10/04/2012   Routine health maintenance 10/04/2012   Acute left flank pain 08/03/2011   Unspecified intestinal obstruction 12/03/2008   Other diseases of nasal cavity and sinuses(478.19) 05/30/2008   GERD 02/26/2008   PEPTIC STRICTURE 02/22/2008   HIATAL HERNIA 02/22/2008   Chest tightness or pressure 01/09/2008   ERECTILE DYSFUNCTION 04/07/2007   Allergic rhinitis 04/07/2007   COLITIS, ULCERATIVE NOS 04/07/2007   GASTRIC POLYP, HX OF 04/07/2007    ONSET DATE: 10/04/2023  REFERRING DIAG: G20.B2 (ICD-10-CM) - Parkinson's disease with dyskinesia and fluctuating manifestations (HCC)  THERAPY DIAG:  Unsteadiness on feet  Other abnormalities of gait and mobility  Muscle weakness (generalized)  Rationale for Evaluation and Treatment: Rehabilitation  SUBJECTIVE:                                                                                                                                                                                             SUBJECTIVE STATEMENT: No falls, nothing new. R knee is doing alright today.   Pt accompanied by: self  PERTINENT HISTORY: PMH: PD, MCI, trigeminal neuralgia, LBP   Some dyskinesia  mild dyskinesia in the R leg and bilateral UE/hands   PAIN:  Are you having pain? No   PRECAUTIONS: None  RED FLAGS: None   WEIGHT BEARING RESTRICTIONS: No  FALLS: Has patient fallen in last 6 months? No  LIVING  ENVIRONMENT: Lives with: lives with their spouse Lives in: House/apartment Stairs: Yes: Internal: 13 steps; on right going up and has a regular flight of stairs  to get to the gym, but otherwise does not need to do steps  and External: 3 steps; on right going up Has following equipment at home: None  PLOF: Independent, Vocation/Vocational requirements: Retired, Special educational needs teacher for about 30 years , and Leisure: reading  PATIENT GOALS: Wants to maintain and not feel like he's going to fall. Improve overall strength   OBJECTIVE:  Note: Objective measures were completed at Evaluation unless otherwise noted.  COGNITION: Overall cognitive status: Within functional limits for tasks assessed   SENSATION: Light touch: WFL  COORDINATION: Heel to shin: WNL bilaterally   POSTURE: rounded shoulders and forward head  LOWER EXTREMITY ROM:    Decr hamstring ROM with LLE   LOWER EXTREMITY MMT:    MMT Right Eval Left Eval  Hip flexion 4+ 4+  Hip extension    Hip abduction    Hip adduction    Hip internal rotation    Hip external rotation    Knee flexion 5 4+  Knee extension 5 4+  Ankle dorsiflexion 5 5  Ankle plantarflexion    Ankle inversion    Ankle eversion    (Blank rows = not tested)  BED MOBILITY:  Pt reports difficulties getting in the bed, has a tough time pressing with his R hand/knuckle.   TRANSFERS: Assistive device utilized: None  Sit to stand: SBA Stand to sit: SBA Performs with no UE support    GAIT: Gait pattern: decreased arm swing- Right, decreased arm swing- Left, decreased stride length, and decreased trunk rotation Distance walked: Clinic distances  Assistive device utilized: None Level of assistance: Modified independence and SBA Comments: Noted freezing with turns, when pt was going to turn to the R, and noted pt otherwise with more shuffled steps when turning   Pt noting some festination at home with incr anterior weight shift   FUNCTIONAL TESTS:   5 times sit to stand: 14.7 seconds with no UE support  10 meter walk test: 9.7 seconds = 3.38 ft/sec   VITALS  There were no vitals filed for this visit.                                                                                                                               TREATMENT:    Ther Act  Went over freezing strategies - stopping, resetting , and then taking a big step, pt reports he has been working on this at home and it has been helping  Plan for D/C at next session and plan for PD screens in 6 months, also discussed that pt has an OT referral if pt wants to get scheduled for OT, pt unsure at this time, but discussed with pt that he has not had OT before that it would be beneficial to be proactive  Discussed community exercise after PT and that PWR moves classes would be a good option for pt, provided pt more information  Importance of arm swing with gait  NMR  On black side of BOSU: 15 reps lateral weight shifitng 15 reps PWR Up with focus on elbow extension and opening up hands  Pt reporting incr BLE fatigue afterwards and needing to have a seated rest break afterwards  On blue side of BOSU: Alternating forward step ups with contralateral march 12 reps each side, intermittent UE support for balance   On blue foam beam: Stepping laterally over taller orange obstacle and reaching across body to grab Squigz, stepping back to midline and stepping over other taller obstacle laterally to place on mirror with ipsilateral hand, performed 10 reps, performed an additional set of 10 reps with adding cog dual tasking with pt naming states/cities in alphabetical order   Pt performs PWR! Moves in quadruped position x 10 reps   PWR! Up for improved posture  PWR! Rock for improved weighshifting  PWR! Twist for improved trunk rotation   PWR! Step for improved step initiation   Cues provided for technique and larger amplitude movements. Performed in case pt wants to go to PWR  moves exercise classes and is familiar with another position of how to perform. Pt able to perform floor transfers independently.     PATIENT EDUCATION: Education details: Continue HEP, see therapeutic activity section  Person educated: Patient Education method: Explanation, Demonstration, Verbal cues, and Handouts Education comprehension: verbalized understanding and returned demonstration  HOME EXERCISE PROGRAM: Standing PWR Up, Rock, Twist  Access Code: [email or web address] URL: [email or web address] Date: 10/11/2023 Prepared by: Alethia Berthold Plaster  Exercises - Step Forward with Arms Reaching to Sides  - 1 x daily - 7 x weekly - 3 sets - 10 reps - Standing Reach to Opposite Side with Weight Shift  - 1 x daily - 7 x weekly - 3 sets - 10 reps  GOALS: Goals reviewed with patient? Yes  SHORT TERM GOALS: ALL STGS = LTGS  LONG TERM GOALS: Target date: 11/03/2023  Pt will be independent with final PD specific HEP for gait, balance, functional strength.  Baseline:  Goal status: INITIAL  2.  Pt will improve miniBEST to at least a 25/28 in order to demo decr fall risk.  Baseline: 22/28 Goal status: INITIAL  3.  Pt will improve 5x sit<>stand to less than or equal to 12.5 sec to demonstrate improved functional strength and transfer efficiency.  Baseline: 14.7 seconds with no UE support Goal status: INITIAL  4.  Pt will verbalize understanding of local Parkinson's disease resources, including options for continued community fitness after D/C.   Baseline:  Goal status: INITIAL   ASSESSMENT:  CLINICAL IMPRESSION: Today's skilled session focused on higher level balance on compliant surfaces for SLS, weight shifting, and addition of cognitive dual tasking. Pt's BLE fatigued more easily on BOSU. Also went over quadruped PWR moves in case pt decides to do PWR moves exercise class after D/C. Will continue POC.   OBJECTIVE IMPAIRMENTS: Abnormal gait, decreased activity tolerance,  decreased balance, decreased coordination, impaired flexibility, and postural dysfunction.   ACTIVITY LIMITATIONS: bed mobility and locomotion level  PARTICIPATION LIMITATIONS:  N/A  PERSONAL FACTORS: Age, Behavior pattern, Past/current experiences, Time since onset of injury/illness/exacerbation, and 3+ comorbidities: PD, MCI, trigeminal neuralgia, LBP   are also affecting patient's functional outcome.   REHAB POTENTIAL: Excellent  CLINICAL DECISION MAKING: Stable/uncomplicated  EVALUATION COMPLEXITY: Low  PLAN:  PT FREQUENCY: 2x/week  PT DURATION: 8 weeks - anticipate just 4 weeks   PLANNED INTERVENTIONS: [id number]- PT Re-evaluation, 97110-Therapeutic exercises, 97530- Therapeutic activity, [id number]- Neuromuscular re-education, 97535- Self Care,  [id number]- Manual therapy, [phone]- Gait training, Patient/Family education, Balance training, and Stair training  PLAN FOR NEXT SESSION:  Check goals, D/C, plan for screens in 6 months    Drake Leach, PT, DPT 11/01/2023, 1:29 PM

## 2023-11-03 ENCOUNTER — Ambulatory Visit: Payer: Medicare HMO | Admitting: Physical Therapy

## 2023-11-03 ENCOUNTER — Ambulatory Visit: Payer: Medicare HMO | Admitting: Speech Pathology

## 2023-11-03 DIAGNOSIS — R3916 Straining to void: Secondary | ICD-10-CM | POA: Diagnosis not present

## 2023-11-03 DIAGNOSIS — R3912 Poor urinary stream: Secondary | ICD-10-CM | POA: Diagnosis not present

## 2023-11-03 DIAGNOSIS — Z87442 Personal history of urinary calculi: Secondary | ICD-10-CM | POA: Diagnosis not present

## 2023-11-03 DIAGNOSIS — N5201 Erectile dysfunction due to arterial insufficiency: Secondary | ICD-10-CM | POA: Diagnosis not present

## 2023-11-07 NOTE — Therapy (Unsigned)
 OUTPATIENT SPEECH LANGUAGE PATHOLOGY [facility]ARKINSON'S [facility]REATMENT   Patient Name: William [name] MRN: [id number] DOB:Jun 24, 1951, 73 y.o., male Today's Date: 11/08/2023  PCP: Tresa Garter MD REFERRING PROVIDER: Vladimir Faster, DO  END OF SESSION:  End of Session - 11/08/23 0759     Visit Number 3    Number of Visits 17    Date for SLP Re-Evaluation 12/13/23    Authorization Type [employer]    SLP Start Time 0800    SLP Stop Time  0843    SLP Time Calculation (min) 43 min    Activity Tolerance Patient tolerated treatment well               Past Medical History:  Diagnosis Date   Allergic rhinitis    Anemia, iron deficiency    Arthritis    BPH associated with nocturia    ED (erectile dysfunction)    GERD (gastroesophageal reflux disease)    Hiatal hernia    History of COVID-19 11/2020   per pt moderate symptoms that resovled   History of hypertension    followed by pcp  (per pt resolved no medication since approx 2017)   History of kidney stones    Left ureteral calculus    Parkinson disease [facility])    neurologist--- dr tat;   mild jaw dyskinesia,   gait disorder, tremors   Peyronie's disease    PONV (postoperative nausea and vomiting)    S/P dilatation of esophageal stricture    Trigeminal neuropathy    left side   Ulcerative colitis    followed by dr Marina Goodell (GI)//   1992 s/p  total abdominal colectomy with ileoanal anastomosis   Wears glasses    Past Surgical History:  Procedure Laterality Date   CRANIECTOMY Left 09/16/2022   Procedure: RETROSIGMOID CRANI FOR MICROVASCULAR DECOMPRESSION;  Surgeon: Bethann Goo, DO;  Location: [facility] OR;  Service: Neurosurgery;  Laterality: Left;   CYSTOSCOPY W/ URETERAL STENT PLACEMENT  2012   for ureteral stricture   CYSTOSCOPY WITH RETROGRADE PYELOGRAM, URETEROSCOPY AND STENT PLACEMENT Left 12/31/2021   Procedure: CYSTOSCOPY WITH RETROGRADE PYELOGRAM, FLUOROSCOPIC INTERPRETATION, URETEROSCOPY  AND STENT PLACEMENT;   Surgeon: Marcine Matar, MD;  Location: [facility];  Service: Urology;  Laterality: Left;   CYSTOSCOPY/URETEROSCOPY/HOLMIUM LASER/STENT PLACEMENT  10/22/2005   @[facility] ;   w/ left ureteral balloon dilatation   HOLMIUM LASER APPLICATION Left 12/31/2021   Procedure: HOLMIUM LASER APPLICATION;  Surgeon: Marcine Matar, MD;  Location: [facility];  Service: Urology;  Laterality: Left;   INGUINAL HERNIA REPAIR     per pt unilateral  approxl 1962   KNEE ARTHROSCOPY Right 02/21/2002   @[facility]    LUMBAR LAMINECTOMY/DECOMPRESSION MICRODISCECTOMY N/A 06/28/2022   Procedure: L4-5 decompression with removal of synovial cyst;  Surgeon: Venita Lick, MD;  Location: [facility] OR;  Service: Orthopedics;  Laterality: N/A;  2.5 hrs 3 C-Bed   NASAL SINUS SURGERY     x2  in 2004   RECONSTRUCTION TENDON PULLEY HAND Right 05/03/2007   @[facility] ;  right long finger   TOTAL COLECTOMY  1992   W/  ILIEOANAL ANASTOMOSIS (for ilieoanal anatomatic stricture , partial bowel obstruction and required peroidic pneumatic dilatation's)   VARICOCELECTOMY     Patient Active Problem List   Diagnosis Date Noted   Pain in finger of right hand 07/13/2023   Acute bacterial bronchitis 12/23/2022   Trigeminal neuralgia 09/16/2022   Synovial cyst of lumbar facet joint 06/28/2022   Degeneration of lumbar  intervertebral disc 01/06/2022   Low back pain 12/22/2021   Trigeminal neuralgia of left side of face 10/26/2021   Somatic dysfunction of right sacroiliac joint 02/18/2020   Cough 12/09/2019   Hypertensive disorder 07/23/2019   Lumbosacral spondylosis without myelopathy 05/01/2019   URI (upper respiratory infection) 01/20/2019   Pain in right knee 02/15/2018   Chest pain 05/28/2017   Hyperlipidemia 05/28/2017   Bradycardia 05/28/2017   Chest pain, atypical 05/27/2017   Lightheadedness 05/27/2017   Finger laceration 04/04/2017   Parkinson's disease (HCC) 02/26/2016   Eustachian tube dysfunction  02/06/2016   Hyponatremia 01/26/2016   Tremor 11/12/2015   Visual-spatial impairment 11/12/2015   Stress at work 10/24/2014   Nonspecific abnormal electrocardiogram (ECG) (EKG) 06/18/2014   S/P colectomy 06/18/2014   Well adult exam 06/18/2014   Renal stones 06/18/2014   Elevated BP 06/18/2014   Headache 04/25/2014   Essential hypertension, benign 04/25/2014   Serous otitis media 03/21/2014   Travel advice encounter 04/17/2013   Calcium nephrolithiasis 10/04/2012   Routine health maintenance 10/04/2012   Acute left flank pain 08/03/2011   Unspecified intestinal obstruction 12/03/2008   Other diseases of nasal cavity and sinuses(478.19) 05/30/2008   GERD 02/26/2008   PEPTIC STRICTURE 02/22/2008   HIATAL HERNIA 02/22/2008   Chest tightness or pressure 01/09/2008   ERECTILE DYSFUNCTION 04/07/2007   Allergic rhinitis 04/07/2007   COLITIS, ULCERATIVE NOS 04/07/2007   GASTRIC POLYP, HX OF 04/07/2007    ONSET DATE: 10/11/23 (referral date)  REFERRING DIAG:  R47.9 (ICD-10-CM) - Difficulty with speech  G20.B2 (ICD-10-CM) - Parkinson's disease with dyskinesia and fluctuating manifestations   THERAPY DIAG:  Dysarthria and anarthria  Rationale for Evaluation and Treatment: Rehabilitation  SUBJECTIVE:   SUBJECTIVE STATEMENT: "going well"  Pt accompanied by: self  PERTINENT HISTORY: PMH: PD (2017), MCI, trigeminal neuralgia, LBP Now experiencing changes in balance and communication, including word finding   PAIN:  Are you having pain? Yes: NPRS scale: 3/10 Pain location: right knee Pain description: chronic Aggravating factors: movement  Relieving factors: not moving   FALLS: Has patient fallen in last 6 months?  No  LIVING ENVIRONMENT: Lives with: lives with their spouse Lives in: House/apartment  PLOF:  Level of assistance: Independent with ADLs, Independent with IADLs Employment: Retired Designer, [employer]ellery)  PATIENT GOALS: to improve articulation and clarity of  speech   OBJECTIVE:  Note: Objective measures were completed at Evaluation unless otherwise noted.                                                                                                                           TREATMENT DATE:  11-08-23: Completed some HEP since last session. Reviewed completing SO lesson at least once a day before next session. Pt verbalized understanding. Targeted improving vocal quality and increasing intensity through progressively difficulty speech tasks using Speak Out! program, lesson #4. ST leads pt through exercises providing occasional model prior to pt execution. Occasional min-A required to achieve target dB  this date. Averages this date:  Sustained "ah" 90 dB Counting 84 dB  Reading 78 dB Conversation task  80 dB  Side comments: 71 dB with occasional cues to maintain average Presented with intermittent throat clearing with cues to take a sip of water. Pt verbalized understanding of SLP recommendations for throat clear alternatives but benefited from cues to aid carryover this session.   11-01-23: Returned with personal Speak Out! Workbook. Watched Parkinson's Disease video on [employer] website, which he was reported was informative. Educated and instructed dysarthria strategies (SLOP) and word finding strategies. Handout provided. Able to demo over-articulation during oral reading with rare min A. Will continue to train in upcoming sessions. Targeted improving vocal quality and increasing intensity through progressively difficulty speech tasks using Speak Out! program, lesson #1. ST leads pt through exercises providing usual model prior to pt execution. occasional min-A required to achieve target dB this date. Averages this date:  Sustained "ah" 85 dB Reading 85 dB Conversation task 75 dB   10-18-23: Provided education re: typical communication changes and challenges related to progression on Parkinson's Disease (PD). Introduced [employer]! Program and principles, with  brief review of being intentional and introduction of Parkinson Voice [employer]. Recommended watching What is PD? Video to aid patient comprehension. Ordered Speak Out! Workbook this session. Initiated education and instruction SLOP dysarthria strategies, in which pt able to demo at sentence level with occasional min A to slow rate. Updated HEP for oral reading 5-10 minutes per day.   PATIENT EDUCATION: Education details: see above Person educated: Patient Education method: Chief Technology [employer] Education comprehension: verbalized understanding and needs further education  HOME EXERCISE PROGRAM: Speak Out!   GOALS: Goals reviewed with patient? Yes  SHORT TERM GOALS: Target date: 11/14/2023  Pt will complete daily HEP as recommended across 2 sessions  Baseline: Goal status: IN PROGRESS  2.  Pt will achieve targeted dB levels in demonstration of Speak Out! Lessons with 90% accuracy given rare min A over 2 sessions  Baseline:  Goal status: IN PROGRESS  3.  Pt will utilize dysarthria compensations in 10+ minute conversation to be 100% intelligible given occasional min A  Baseline:  Goal status: MET  4.  Pt will utilize word retrieval strategies to aid anomia in 10+ minute conversation given occasional min A  Baseline:  Goal status: IN PROGRESS    LONG TERM GOALS: Target date: 12/13/2023  Pt will complete daily HEP at least 1x/day (BID recommended) > 1 week  Baseline:  Goal status: IN PROGRESS  2.  Pt will utilize dysarthria compensations in 15+ minute conversation to optimize vocal intensity and clarity given rare min A over 2 sessions Baseline:  Goal status: IN PROGRESS  3.  Pt will reduce throat clearing by 75% by LTG date Baseline: >5 at eval Goal status: IN PROGRESS  4.  Pt will report improved communication effectiveness via PROM by 2 point improvement by last ST session  Baseline: CPIB=24 Goal status: IN PROGRESS  ASSESSMENT:  CLINICAL  IMPRESSION: Patient is a 73 y.o. M who was seen today for communication changes related to progression of Parkinson's Disease. Today, pt presents with mild hypokinetic dysarthria c/b imprecise articulation and intermittent rushes in speech. Continued education and instruction of Speak Out! Program with occasional min A required to optimize volume. Pt would benefit from skilled ST intervention to optimize communication effectiveness and maximize life participation.   OBJECTIVE IMPAIRMENTS: Objective impairments include dysarthria. These impairments are limiting patient from effectively communicating at  home and in community.Factors affecting potential to achieve goals and functional outcome are  PD . Patient will benefit from skilled SLP services to address above impairments and improve overall function.  REHAB POTENTIAL: Excellent  PLAN:  SLP FREQUENCY: 2x/week  SLP DURATION: 8 weeks - anticipate 4 weeks   PLANNED INTERVENTIONS: Language facilitation, Environmental controls, Internal/external aids, Functional tasks, Multimodal communication approach, SLP instruction and feedback, Compensatory strategies, Patient/family education, [phone] Treatment of speech (30 or 45 min) , and 62130- Speech Eval Sound Prod, Articulate, Phonological    Gracy Racer, CCC-SLP 11/08/2023, 8:00 AM

## 2023-11-08 ENCOUNTER — Ambulatory Visit: Payer: Medicare HMO

## 2023-11-08 DIAGNOSIS — R293 Abnormal posture: Secondary | ICD-10-CM | POA: Diagnosis not present

## 2023-11-08 DIAGNOSIS — R471 Dysarthria and anarthria: Secondary | ICD-10-CM | POA: Diagnosis not present

## 2023-11-08 DIAGNOSIS — R41841 Cognitive communication deficit: Secondary | ICD-10-CM | POA: Diagnosis not present

## 2023-11-08 DIAGNOSIS — R2689 Other abnormalities of gait and mobility: Secondary | ICD-10-CM | POA: Diagnosis not present

## 2023-11-08 DIAGNOSIS — M6281 Muscle weakness (generalized): Secondary | ICD-10-CM | POA: Diagnosis not present

## 2023-11-08 DIAGNOSIS — R29818 Other symptoms and signs involving the nervous system: Secondary | ICD-10-CM | POA: Diagnosis not present

## 2023-11-08 DIAGNOSIS — R2681 Unsteadiness on feet: Secondary | ICD-10-CM | POA: Diagnosis not present

## 2023-11-09 ENCOUNTER — Other Ambulatory Visit: Payer: Self-pay | Admitting: Nurse Practitioner

## 2023-11-10 ENCOUNTER — Ambulatory Visit: Payer: Self-pay | Admitting: Physical Therapy

## 2023-11-10 ENCOUNTER — Encounter: Payer: Self-pay | Admitting: Physical Therapy

## 2023-11-10 ENCOUNTER — Ambulatory Visit: Payer: Medicare HMO | Admitting: Speech Pathology

## 2023-11-10 DIAGNOSIS — R41841 Cognitive communication deficit: Secondary | ICD-10-CM | POA: Diagnosis not present

## 2023-11-10 DIAGNOSIS — R471 Dysarthria and anarthria: Secondary | ICD-10-CM

## 2023-11-10 DIAGNOSIS — R293 Abnormal posture: Secondary | ICD-10-CM | POA: Diagnosis not present

## 2023-11-10 DIAGNOSIS — M6281 Muscle weakness (generalized): Secondary | ICD-10-CM | POA: Diagnosis not present

## 2023-11-10 DIAGNOSIS — R2681 Unsteadiness on feet: Secondary | ICD-10-CM | POA: Diagnosis not present

## 2023-11-10 DIAGNOSIS — R2689 Other abnormalities of gait and mobility: Secondary | ICD-10-CM | POA: Diagnosis not present

## 2023-11-10 DIAGNOSIS — R29818 Other symptoms and signs involving the nervous system: Secondary | ICD-10-CM | POA: Diagnosis not present

## 2023-11-10 NOTE — Therapy (Signed)
 OUTPATIENT SPEECH LANGUAGE PATHOLOGY [facility]'S [facility]REATMENT   Patient Name: William [name] MRN: [id number] DOB:[birth year], 73 y.o., male Today's Date: 11/10/2023  PCP: Tresa Garter MD REFERRING PROVIDER: Vladimir Faster, DO  END OF SESSION:  End of Session - 11/10/23 0800     Visit Number 4    Number of Visits 17    Date for SLP Re-Evaluation 12/13/23    Authorization Type [employer] Medicare    SLP Start Time 0800    SLP Stop Time  0845    SLP Time Calculation (min) 45 min    Activity Tolerance Patient tolerated treatment well              Past Medical History:  Diagnosis Date   Allergic rhinitis    Anemia, iron deficiency    Arthritis    BPH associated with nocturia    ED (erectile dysfunction)    GERD (gastroesophageal reflux disease)    Hiatal hernia    History of COVID-19 11/2020   per pt moderate symptoms that resovled   History of hypertension    followed by pcp  (per pt resolved no medication since approx 2017)   History of kidney stones    Left ureteral calculus    Parkinson disease [facility])    neurologist--- dr tat;   mild jaw dyskinesia,   gait disorder, tremors   Peyronie's disease    PONV (postoperative nausea and vomiting)    S/P dilatation of esophageal stricture    Trigeminal neuropathy    left side   Ulcerative colitis    followed by dr Marina Goodell (GI)//   1992 s/p  total abdominal colectomy with ileoanal anastomosis   Wears glasses    Past Surgical History:  Procedure Laterality Date   CRANIECTOMY Left 09/16/2022   Procedure: RETROSIGMOID CRANI FOR MICROVASCULAR DECOMPRESSION;  Surgeon: Bethann Goo, DO;  Location: [facility] OR;  Service: Neurosurgery;  Laterality: Left;   CYSTOSCOPY W/ URETERAL STENT PLACEMENT  2012   for ureteral stricture   CYSTOSCOPY WITH RETROGRADE PYELOGRAM, URETEROSCOPY AND STENT PLACEMENT Left 12/31/2021   Procedure: CYSTOSCOPY WITH RETROGRADE PYELOGRAM, FLUOROSCOPIC INTERPRETATION, URETEROSCOPY  AND STENT PLACEMENT;   Surgeon: Marcine Matar, MD;  Location: [facility];  Service: Urology;  Laterality: Left;   CYSTOSCOPY/URETEROSCOPY/HOLMIUM LASER/STENT PLACEMENT  10/22/2005   @[facility] ;   w/ left ureteral balloon dilatation   HOLMIUM LASER APPLICATION Left 12/31/2021   Procedure: HOLMIUM LASER APPLICATION;  Surgeon: Marcine Matar, MD;  Location: [facility];  Service: Urology;  Laterality: Left;   INGUINAL HERNIA REPAIR     per pt unilateral  approxl 1962   KNEE ARTHROSCOPY Right 02/21/2002   @[facility]    LUMBAR LAMINECTOMY/DECOMPRESSION MICRODISCECTOMY N/A 06/28/2022   Procedure: L4-5 decompression with removal of synovial cyst;  Surgeon: Venita Lick, MD;  Location: [facility] OR;  Service: Orthopedics;  Laterality: N/A;  2.5 hrs 3 C-Bed   NASAL SINUS SURGERY     x2  in 2004   RECONSTRUCTION TENDON PULLEY HAND Right 05/03/2007   @[facility] ;  right long finger   TOTAL COLECTOMY  1992   W/  ILIEOANAL ANASTOMOSIS (for ilieoanal anatomatic stricture , partial bowel obstruction and required peroidic pneumatic dilatation's)   VARICOCELECTOMY     Patient Active Problem List   Diagnosis Date Noted   Pain in finger of right hand 07/13/2023   Acute bacterial bronchitis 12/23/2022   Trigeminal neuralgia 09/16/2022   Synovial cyst of lumbar facet joint 06/28/2022   Degeneration of lumbar intervertebral  disc 01/06/2022   Low back pain 12/22/2021   Trigeminal neuralgia of left side of face 10/26/2021   Somatic dysfunction of right sacroiliac joint 02/18/2020   Cough 12/09/2019   Hypertensive disorder 07/23/2019   Lumbosacral spondylosis without myelopathy 05/01/2019   URI (upper respiratory infection) 01/20/2019   Pain in right knee 02/15/2018   Chest pain 05/28/2017   Hyperlipidemia 05/28/2017   Bradycardia 05/28/2017   Chest pain, atypical 05/27/2017   Lightheadedness 05/27/2017   Finger laceration 04/04/2017   Parkinson's disease (HCC) 02/26/2016   Eustachian tube dysfunction  02/06/2016   Hyponatremia 01/26/2016   Tremor 11/12/2015   Visual-spatial impairment 11/12/2015   Stress at work 10/24/2014   Nonspecific abnormal electrocardiogram (ECG) (EKG) 06/18/2014   S/P colectomy 06/18/2014   Well adult exam 06/18/2014   Renal stones 06/18/2014   Elevated BP 06/18/2014   Headache 04/25/2014   Essential hypertension, benign 04/25/2014   Serous otitis media 03/21/2014   Travel advice encounter 04/17/2013   Calcium nephrolithiasis 10/04/2012   Routine health maintenance 10/04/2012   Acute left flank pain 08/03/2011   Unspecified intestinal obstruction 12/03/2008   Other diseases of nasal cavity and sinuses(478.19) 05/30/2008   GERD 02/26/2008   PEPTIC STRICTURE 02/22/2008   HIATAL HERNIA 02/22/2008   Chest tightness or pressure 01/09/2008   ERECTILE DYSFUNCTION 04/07/2007   Allergic rhinitis 04/07/2007   COLITIS, ULCERATIVE NOS 04/07/2007   GASTRIC POLYP, HX OF 04/07/2007    ONSET DATE: 10/11/23 (referral date)  REFERRING DIAG:  R47.9 (ICD-10-CM) - Difficulty with speech  G20.B2 (ICD-10-CM) - Parkinson's disease with dyskinesia and fluctuating manifestations   THERAPY DIAG:  Dysarthria and anarthria  Rationale for Evaluation and Treatment: Rehabilitation  SUBJECTIVE:   SUBJECTIVE STATEMENT: Pt reports daily HEP, utilizing speak out workbook. Endorses increased awareness of speech deterioration.  Pt accompanied by: self  PERTINENT HISTORY: PMH: PD (2017), MCI, trigeminal neuralgia, LBP Now experiencing changes in balance and communication, including word finding   PAIN:  Are you having pain? Yes: NPRS scale: 3/10 Pain location: right knee Pain description: chronic Aggravating factors: movement  Relieving factors: not moving   FALLS: Has patient fallen in last 6 months?  No  LIVING ENVIRONMENT: Lives with: lives with their spouse Lives in: House/apartment  PLOF:  Level of assistance: Independent with ADLs, Independent with  IADLs Employment: Retired Designer, [employer]ell[employer])  PATIENT GOALS: to improve articulation and clarity of speech   OBJECTIVE:  Note: Objective measures were completed at Evaluation unless otherwise noted.                                                                                                                           TREATMENT DATE:  11-10-23: SLP targeted improving vocal quality and increasing intensity through progressively difficulty speech tasks using Speak Out! program, lesson #7. ST lead pt through exercises providing occasional model prior to pt execution. Occasional min-A required to achieve target dB this date. Averages this date:  Glides 88 dB  Counting 86 dB  Reading 85 dB  Conversation task 78 dB, Challenge 82 dB Target carryover of skills in informal conversation. Pt demonstrating improved volume and clarity of speech vs baseline entering therapy session. Occasional verbal cues needed to ID times of volume decay.   11-08-23: Completed some HEP since last session. Reviewed completing SO lesson at least once a day before next session. Pt verbalized understanding. Targeted improving vocal quality and increasing intensity through progressively difficulty speech tasks using Speak Out! program, lesson #4. ST leads pt through exercises providing occasional model prior to pt execution. Occasional min-A required to achieve target dB this date. Averages this date:  Sustained "ah" 90 dB Counting 84 dB  Reading 78 dB Conversation task  80 dB  Side comments: 71 dB with occasional cues to maintain average Presented with intermittent throat clearing with cues to take a sip of water. Pt verbalized understanding of SLP recommendations for throat clear alternatives but benefited from cues to aid carryover this session.   11-01-23: Returned with personal Speak Out! Workbook. Watched Parkinson's Disease video on [employer] website, which he was reported was informative. Educated and instructed dysarthria  strategies (SLOP) and word finding strategies. Handout provided. Able to demo over-articulation during oral reading with rare min A. Will continue to train in upcoming sessions. Targeted improving vocal quality and increasing intensity through progressively difficulty speech tasks using Speak Out! program, lesson #1. ST leads pt through exercises providing usual model prior to pt execution. occasional min-A required to achieve target dB this date. Averages this date:  Sustained "ah" 85 dB Reading 85 dB Conversation task 75 dB   10-18-23: Provided education re: typical communication changes and challenges related to progression on Parkinson's Disease (PD). Introduced [employer]! Program and principles, with brief review of being intentional and introduction of Parkinson Voice [employer]. Recommended watching What is PD? Video to aid patient comprehension. Ordered Speak Out! Workbook this session. Initiated education and instruction SLOP dysarthria strategies, in which pt able to demo at sentence level with occasional min A to slow rate. Updated HEP for oral reading 5-10 minutes per day.   PATIENT EDUCATION: Education details: see above Person educated: Patient Education method: Chief Technology Officer Education comprehension: verbalized understanding and needs further education  HOME EXERCISE PROGRAM: Speak Out!   GOALS: Goals reviewed with patient? Yes  SHORT TERM GOALS: Target date: 11/14/2023  Pt will complete daily HEP as recommended across 2 sessions  Baseline: Goal status: IN PROGRESS  2.  Pt will achieve targeted dB levels in demonstration of Speak Out! Lessons with 90% accuracy given rare min A over 2 sessions  Baseline:  Goal status: IN PROGRESS  3.  Pt will utilize dysarthria compensations in 10+ minute conversation to be 100% intelligible given occasional min A  Baseline:  Goal status: MET  4.  Pt will utilize word retrieval strategies to aid anomia in 10+ minute  conversation given occasional min A  Baseline:  Goal status: IN PROGRESS    LONG TERM GOALS: Target date: 12/13/2023  Pt will complete daily HEP at least 1x/day (BID recommended) > 1 week  Baseline:  Goal status: IN PROGRESS  2.  Pt will utilize dysarthria compensations in 15+ minute conversation to optimize vocal intensity and clarity given rare min A over 2 sessions Baseline:  Goal status: IN PROGRESS  3.  Pt will reduce throat clearing by 75% by LTG date Baseline: >5 at eval Goal status: IN PROGRESS  4.  Pt will report improved  communication effectiveness via PROM by 2 point improvement by last ST session  Baseline: CPIB=24 Goal status: IN PROGRESS  ASSESSMENT:  CLINICAL IMPRESSION: Patient is a 73 y.o. M who was seen today for communication changes related to progression of Parkinson's Disease. Today, pt presents with mild hypokinetic dysarthria c/b imprecise articulation and intermittent rushes in speech. Continued education and instruction of Speak Out! Program with occasional min A required to optimize volume. Pt would benefit from skilled ST intervention to optimize communication effectiveness and maximize life participation.   OBJECTIVE IMPAIRMENTS: Objective impairments include dysarthria. These impairments are limiting patient from effectively communicating at home and in community.Factors affecting potential to achieve goals and functional outcome are  PD . Patient will benefit from skilled SLP services to address above impairments and improve overall function.  REHAB POTENTIAL: Excellent  PLAN:  SLP FREQUENCY: 2x/week  SLP DURATION: 8 weeks - anticipate 4 weeks   PLANNED INTERVENTIONS: Language facilitation, Environmental controls, Internal/external aids, Functional tasks, Multimodal communication approach, SLP instruction and feedback, Compensatory strategies, Patient/family education, [phone] Treatment of speech (30 or 45 min) , and [id number]- Speech Eval Sound Prod,  Articulate, Phonological    [employer], Student-SLP 11/10/2023, 8:00 AM

## 2023-11-10 NOTE — Therapy (Signed)
 OUTPATIENT PHYSICAL THERAPY NEURO TREATMENT/DISCHARGE SUMMARY   Patient Name: William [name] MRN: [id number] DOB:[birth year], 73 y.o., male Today's Date: 11/10/2023   PCP: Tresa Garter, MD   REFERRING PROVIDER: Vladimir Faster, DO  END OF SESSION:  PT End of Session - 11/10/23 0848     Visit Number 9    Number of Visits 9    Date for PT Re-Evaluation 12/05/23    PT Start Time 0846    PT Stop Time 0924    PT Time Calculation (min) 38 min    Equipment Utilized During Treatment Gait belt    Activity Tolerance Patient tolerated treatment well    Behavior During Therapy WFL for tasks assessed/performed                Past Medical History:  Diagnosis Date   Allergic rhinitis    Anemia, iron deficiency    Arthritis    BPH associated with nocturia    ED (erectile dysfunction)    GERD (gastroesophageal reflux disease)    Hiatal hernia    History of COVID-19 11/2020   per pt moderate symptoms that resovled   History of hypertension    followed by pcp  (per pt resolved no medication since approx 2017)   History of kidney stones    Left ureteral calculus    Parkinson disease [facility])    neurologist--- dr tat;   mild jaw dyskinesia,   gait disorder, tremors   Peyronie's disease    PONV (postoperative nausea and vomiting)    S/P dilatation of esophageal stricture    Trigeminal neuropathy    left side   Ulcerative colitis    followed by dr Marina Goodell (GI)//   1992 s/p  total abdominal colectomy with ileoanal anastomosis   Wears glasses    Past Surgical History:  Procedure Laterality Date   CRANIECTOMY Left 09/16/2022   Procedure: RETROSIGMOID CRANI FOR MICROVASCULAR DECOMPRESSION;  Surgeon: Bethann Goo, DO;  Location: [facility] OR;  Service: Neurosurgery;  Laterality: Left;   CYSTOSCOPY W/ URETERAL STENT PLACEMENT  2012   for ureteral stricture   CYSTOSCOPY WITH RETROGRADE PYELOGRAM, URETEROSCOPY AND STENT PLACEMENT Left 12/31/2021   Procedure: CYSTOSCOPY WITH RETROGRADE  PYELOGRAM, FLUOROSCOPIC INTERPRETATION, URETEROSCOPY  AND STENT PLACEMENT;  Surgeon: Marcine Matar, MD;  Location: [facility];  Service: Urology;  Laterality: Left;   CYSTOSCOPY/URETEROSCOPY/HOLMIUM LASER/STENT PLACEMENT  10/22/2005   @[facility] ;   w/ left ureteral balloon dilatation   HOLMIUM LASER APPLICATION Left 12/31/2021   Procedure: HOLMIUM LASER APPLICATION;  Surgeon: Marcine Matar, MD;  Location: [facility];  Service: Urology;  Laterality: Left;   INGUINAL HERNIA REPAIR     per pt unilateral  approxl 1962   KNEE ARTHROSCOPY Right 02/21/2002   @[facility]    LUMBAR LAMINECTOMY/DECOMPRESSION MICRODISCECTOMY N/A 06/28/2022   Procedure: L4-5 decompression with removal of synovial cyst;  Surgeon: Venita Lick, MD;  Location: [facility] OR;  Service: Orthopedics;  Laterality: N/A;  2.5 hrs 3 C-Bed   NASAL SINUS SURGERY     x2  in 2004   RECONSTRUCTION TENDON PULLEY HAND Right 05/03/2007   @[facility] ;  right long finger   TOTAL COLECTOMY  1992   W/  ILIEOANAL ANASTOMOSIS (for ilieoanal anatomatic stricture , partial bowel obstruction and required peroidic pneumatic dilatation's)   VARICOCELECTOMY     Patient Active Problem List   Diagnosis Date Noted   Pain in finger of right hand 07/13/2023   Acute bacterial bronchitis 12/23/2022   Trigeminal  neuralgia 09/16/2022   Synovial cyst of lumbar facet joint 06/28/2022   Degeneration of lumbar intervertebral disc 01/06/2022   Low back pain 12/22/2021   Trigeminal neuralgia of left side of face 10/26/2021   Somatic dysfunction of right sacroiliac joint 02/18/2020   Cough 12/09/2019   Hypertensive disorder 07/23/2019   Lumbosacral spondylosis without myelopathy 05/01/2019   URI (upper respiratory infection) 01/20/2019   Pain in right knee 02/15/2018   Chest pain 05/28/2017   Hyperlipidemia 05/28/2017   Bradycardia 05/28/2017   Chest pain, atypical 05/27/2017   Lightheadedness 05/27/2017   Finger laceration 04/04/2017    Parkinson's disease (HCC) 02/26/2016   Eustachian tube dysfunction 02/06/2016   Hyponatremia 01/26/2016   Tremor 11/12/2015   Visual-spatial impairment 11/12/2015   Stress at work 10/24/2014   Nonspecific abnormal electrocardiogram (ECG) (EKG) 06/18/2014   S/P colectomy 06/18/2014   Well adult exam 06/18/2014   Renal stones 06/18/2014   Elevated BP 06/18/2014   Headache 04/25/2014   Essential hypertension, benign 04/25/2014   Serous otitis media 03/21/2014   Travel advice encounter 04/17/2013   Calcium nephrolithiasis 10/04/2012   Routine health maintenance 10/04/2012   Acute left flank pain 08/03/2011   Unspecified intestinal obstruction 12/03/2008   Other diseases of nasal cavity and sinuses(478.19) 05/30/2008   GERD 02/26/2008   PEPTIC STRICTURE 02/22/2008   HIATAL HERNIA 02/22/2008   Chest tightness or pressure 01/09/2008   ERECTILE DYSFUNCTION 04/07/2007   Allergic rhinitis 04/07/2007   COLITIS, ULCERATIVE NOS 04/07/2007   GASTRIC POLYP, HX OF 04/07/2007    ONSET DATE: 10/04/2023  REFERRING DIAG: G20.B2 (ICD-10-CM) - Parkinson's disease with dyskinesia and fluctuating manifestations (HCC)  THERAPY DIAG:  Other abnormalities of gait and mobility  Unsteadiness on feet  Rationale for Evaluation and Treatment: Rehabilitation  SUBJECTIVE:                                                                                                                                                                                             SUBJECTIVE STATEMENT: No changes since he was last here. No pain.   Pt accompanied by: self  PERTINENT HISTORY: PMH: PD, MCI, trigeminal neuralgia, LBP   Some dyskinesia  mild dyskinesia in the R leg and bilateral UE/hands   PAIN:  Are you having pain? No   PRECAUTIONS: None  RED FLAGS: None   WEIGHT BEARING RESTRICTIONS: No  FALLS: Has patient fallen in last 6 months? No  LIVING ENVIRONMENT: Lives with: lives with their  spouse Lives in: House/apartment Stairs: Yes: Internal: 13 steps; on right going up and has a regular flight of stairs to get to the  gym, but otherwise does not need to do steps  and External: 3 steps; on right going up Has following equipment at home: None  PLOF: Independent, Vocation/Vocational requirements: Retired, Special educational needs teacher for about 30 years , and Leisure: reading  PATIENT GOALS: Wants to maintain and not feel like he's going to fall. Improve overall strength   OBJECTIVE:  Note: Objective measures were completed at Evaluation unless otherwise noted.  COGNITION: Overall cognitive status: Within functional limits for tasks assessed   SENSATION: Light touch: WFL  COORDINATION: Heel to shin: WNL bilaterally   POSTURE: rounded shoulders and forward head  LOWER EXTREMITY ROM:    Decr hamstring ROM with LLE   LOWER EXTREMITY MMT:    MMT Right Eval Left Eval  Hip flexion 4+ 4+  Hip extension    Hip abduction    Hip adduction    Hip internal rotation    Hip external rotation    Knee flexion 5 4+  Knee extension 5 4+  Ankle dorsiflexion 5 5  Ankle plantarflexion    Ankle inversion    Ankle eversion    (Blank rows = not tested)  BED MOBILITY:  Pt reports difficulties getting in the bed, has a tough time pressing with his R hand/knuckle.   TRANSFERS: Assistive device utilized: None  Sit to stand: SBA Stand to sit: SBA Performs with no UE support    GAIT: Gait pattern: decreased arm swing- Right, decreased arm swing- Left, decreased stride length, and decreased trunk rotation Distance walked: Clinic distances  Assistive device utilized: None Level of assistance: Modified independence and SBA Comments: Noted freezing with turns, when pt was going to turn to the R, and noted pt otherwise with more shuffled steps when turning   Pt noting some festination at home with incr anterior weight shift   FUNCTIONAL TESTS:  5 times sit to stand: 14.7 seconds with no  UE support  10 meter walk test: 9.7 seconds = 3.38 ft/sec   VITALS  There were no vitals filed for this visit.                                                                                                                               TREATMENT:    Ther Act   Goal Assessment: 5x sit <> stand: 10.8 seconds no UE support TUG: 7.1 seconds Gait speed: 9.1 seconds = 3.60 ft/sec   [facility] PT Assessment - 11/10/23 0852       Mini-BESTest   Sit To Stand Normal: Comes to stand without use of hands and stabilizes independently.    Rise to Toes Normal: Stable for 3 s with maximum height.    Stand on one leg (left) Moderate: < 20 s   14 seconds   Stand on one leg (right) Normal: 20 s.   20 seconds   Stand on one leg - lowest score 1    Compensatory Stepping Correction - Forward Normal: Recovers  independently with a single, large step (second realignement is allowed).    Compensatory Stepping Correction - Backward Normal: Recovers independently with a single, large step    Compensatory Stepping Correction - Left Lateral Moderate: Several steps to recover equilibrium   ~3-4 steps   Compensatory Stepping Correction - Right Lateral Moderate: Several steps to recover equilibrium   ~3-4 steps   Stepping Corredtion Lateral - lowest score 1    Stance - Feet together, eyes open, firm surface  Normal: 30s    Stance - Feet together, eyes closed, foam surface  Normal: 30s    Incline - Eyes Closed Normal: Stands independently 30s and aligns with gravity    Change in Gait Speed Normal: Significantly changes walkling speed without imbalance    Walk with head turns - Horizontal Normal: performs head turns with no change in gait speed and good balance    Walk with pivot turns Normal: Turns with feet close FAST (< 3 steps) with good balance.    Step over obstacles Normal: Able to step over box with minimal change of gait speed and with good balance.    Timed UP & GO with Dual Task Normal: No noticeable change  in sitting, standing or walking while backward counting when compared to TUG without    Mini-BEST total score 26            Reviewed HEP for larger amplitude movements/balance:   Pt performs PWR! Moves in standing position x 20 reps   PWR! Up for improved posture  PWR! Rock for improved weighshifting  PWR! Twist for improved trunk rotation   PWR! Step for improved step initiation   Cues provided for opening up hands (esp RUE) and extending elbows    Exercises - Step Forward with Arms Reaching to Sides  - 1 x daily - 7 x weekly - 3 sets - 10 reps - also added in posterior stepping for reactive stepping, pt needing to stop and reset in the middle  - Standing Reach to Opposite Side with Weight Shift  - 1 x daily - 7 x weekly - 3 sets - 10 reps     PATIENT EDUCATION: Education details: Results of goals, reviewed HEP, importance of continued larger amplitude movements in daily life, discussed getting scheduled for OT and what OT will work on in regards to PD, will schedule PT/OT/ST screens after pt wraps up with OT/ST Person educated: Patient Education method: Explanation, Demonstration, Verbal cues, and Handouts Education comprehension: verbalized understanding and returned demonstration  HOME EXERCISE PROGRAM: Standing PWR Moves Forward step into posterior step   Access Code: [email or web address] URL: [email or web address] Date: 11/10/2023 Prepared by: Sherlie Ban  Exercises - Standing Reach to Opposite Side with Weight Shift  - 1 x daily - 7 x weekly - 3 sets - 10 reps  PHYSICAL THERAPY DISCHARGE SUMMARY  Visits from Start of Care: 9  Current functional level related to goals / functional outcomes: See LTGs/Clinical Assessment Statement    Remaining deficits: Bradykinesia, impaired lateral stepping strategy and SLS    Education / Equipment: HEP, PD community resources,  freezing strategies   Patient agrees to discharge. Patient goals were met. Patient  is being discharged due to meeting the stated rehab goals.   GOALS: Goals reviewed with patient? Yes  SHORT TERM GOALS: ALL STGS = LTGS  LONG TERM GOALS: Target date: 11/03/2023  Pt will be independent with final PD specific HEP for gait, balance, functional strength.  Baseline:  Goal status: MET  2.  Pt will improve miniBEST to at least a 25/28 in order to demo decr fall risk.  Baseline: 22/28  26/28 Goal status: MET  3.  Pt will improve 5x sit<>stand to less than or equal to 12.5 sec to demonstrate improved functional strength and transfer efficiency.  Baseline: 14.7 seconds with no UE support  10.8 seconds no UE support Goal status: MET  4.  Pt will verbalize understanding of local Parkinson's disease resources, including options for continued community fitness after D/C.   Baseline:  Goal status: MET   ASSESSMENT:  CLINICAL IMPRESSION: Today's skilled session focused on assessing pt's LTGs for anticipated D/C. Pt has met 4 out of 4 LTGs. Pt with improvement of miniBEST from a 22/28 > 26/28, indicating decr fall risk. Pt still with difficulty with lateral stepping strategies for balance. Reviewed PWR Step for HEP and also added in posterior stepping to HEP. Pt verbalizes understanding of community resources. Educated on continued larger amplitude movements with PD. Pt in agreement for OT eval and will get scheduled for that. Once pt finishes with OT/ST, then will get scheduled for PD screens in the future. Pt in agreement to D/C due to achieving goals and progress.    OBJECTIVE IMPAIRMENTS: Abnormal gait, decreased activity tolerance, decreased balance, decreased coordination, impaired flexibility, and postural dysfunction.   ACTIVITY LIMITATIONS: bed mobility and locomotion level  PARTICIPATION LIMITATIONS:  N/A  PERSONAL FACTORS: Age, Behavior pattern, Past/current experiences, Time since onset of injury/illness/exacerbation, and 3+ comorbidities: PD, MCI, trigeminal  neuralgia, LBP   are also affecting patient's functional outcome.   REHAB POTENTIAL: Excellent  CLINICAL DECISION MAKING: Stable/uncomplicated  EVALUATION COMPLEXITY: Low  PLAN:  PT FREQUENCY: 2x/week  PT DURATION: 8 weeks - anticipate just 4 weeks   PLANNED INTERVENTIONS: 97164- PT Re-evaluation, 97110-Therapeutic exercises, 97530- Therapeutic activity, [id number]- Neuromuscular re-education, 97535- Self Care, [id number]- Manual therapy, [phone]- Gait training, Patient/Family education, Balance training, and Stair training  PLAN FOR NEXT SESSION: D/C   Drake Leach, PT, DPT 11/10/2023, 9:28 AM

## 2023-11-15 ENCOUNTER — Encounter: Payer: Medicare HMO | Admitting: Speech Pathology

## 2023-11-16 NOTE — Therapy (Signed)
 OUTPATIENT OCCUPATIONAL THERAPY PARKINSON'S EVALUATION  Patient Name: William [name] MRN: [id number] DOB:Jun 17, 1951, 73 y.o., male Today's Date: 11/17/2023  PCP: Claudette Head, MD REFERRING PROVIDER: Vladimir Faster, DO  END OF SESSION:  OT End of Session - 11/17/23 0958     Visit Number 1    Number of Visits 17    Date for OT Re-Evaluation 01/17/24    Authorization Type [employer] MCR    Progress Note Due on Visit 10    OT Start Time 0845    OT Stop Time 0925    OT Time Calculation (min) 40 min    Activity Tolerance Patient tolerated treatment well    Behavior During Therapy [facility] for tasks assessed/performed             Past Medical History:  Diagnosis Date   Allergic rhinitis    Anemia, iron deficiency    Arthritis    BPH associated with nocturia    ED (erectile dysfunction)    GERD (gastroesophageal reflux disease)    Hiatal hernia    History of COVID-19 11/2020   per pt moderate symptoms that resovled   History of hypertension    followed by pcp  (per pt resolved no medication since approx 2017)   History of kidney stones    Left ureteral calculus    Parkinson disease [facility])    neurologist--- dr tat;   mild jaw dyskinesia,   gait disorder, tremors   Peyronie's disease    PONV (postoperative nausea and vomiting)    S/P dilatation of esophageal stricture    Trigeminal neuropathy    left side   Ulcerative colitis    followed by dr Marina Goodell (GI)//   1992 s/p  total abdominal colectomy with ileoanal anastomosis   Wears glasses    Past Surgical History:  Procedure Laterality Date   CRANIECTOMY Left 09/16/2022   Procedure: RETROSIGMOID CRANI FOR MICROVASCULAR DECOMPRESSION;  Surgeon: Bethann Goo, DO;  Location: [facility] OR;  Service: Neurosurgery;  Laterality: Left;   CYSTOSCOPY W/ URETERAL STENT PLACEMENT  2012   for ureteral stricture   CYSTOSCOPY WITH RETROGRADE PYELOGRAM, URETEROSCOPY AND STENT PLACEMENT Left 12/31/2021   Procedure: CYSTOSCOPY WITH RETROGRADE  PYELOGRAM, FLUOROSCOPIC INTERPRETATION, URETEROSCOPY  AND STENT PLACEMENT;  Surgeon: Marcine Matar, MD;  Location: [facility];  Service: Urology;  Laterality: Left;   CYSTOSCOPY/URETEROSCOPY/HOLMIUM LASER/STENT PLACEMENT  10/22/2005   @[facility] ;   w/ left ureteral balloon dilatation   HOLMIUM LASER APPLICATION Left 12/31/2021   Procedure: HOLMIUM LASER APPLICATION;  Surgeon: Marcine Matar, MD;  Location: [facility];  Service: Urology;  Laterality: Left;   INGUINAL HERNIA REPAIR     per pt unilateral  approxl 1962   KNEE ARTHROSCOPY Right 02/21/2002   @[facility]    LUMBAR LAMINECTOMY/DECOMPRESSION MICRODISCECTOMY N/A 06/28/2022   Procedure: L4-5 decompression with removal of synovial cyst;  Surgeon: Venita Lick, MD;  Location: [facility] OR;  Service: Orthopedics;  Laterality: N/A;  2.5 hrs 3 C-Bed   NASAL SINUS SURGERY     x2  in 2004   RECONSTRUCTION TENDON PULLEY HAND Right 05/03/2007   @[facility] ;  right long finger   TOTAL COLECTOMY  1992   W/  ILIEOANAL ANASTOMOSIS (for ilieoanal anatomatic stricture , partial bowel obstruction and required peroidic pneumatic dilatation's)   VARICOCELECTOMY     Patient Active Problem List   Diagnosis Date Noted   Pain in finger of right hand 07/13/2023   Acute bacterial bronchitis 12/23/2022   Trigeminal neuralgia  09/16/2022   Synovial cyst of lumbar facet joint 06/28/2022   Degeneration of lumbar intervertebral disc 01/06/2022   Low back pain 12/22/2021   Trigeminal neuralgia of left side of face 10/26/2021   Somatic dysfunction of right sacroiliac joint 02/18/2020   Cough 12/09/2019   Hypertensive disorder 07/23/2019   Lumbosacral spondylosis without myelopathy 05/01/2019   URI (upper respiratory infection) 01/20/2019   Pain in right knee 02/15/2018   Chest pain 05/28/2017   Hyperlipidemia 05/28/2017   Bradycardia 05/28/2017   Chest pain, atypical 05/27/2017   Lightheadedness 05/27/2017   Finger laceration 04/04/2017    Parkinson's disease (HCC) 02/26/2016   Eustachian tube dysfunction 02/06/2016   Hyponatremia 01/26/2016   Tremor 11/12/2015   Visual-spatial impairment 11/12/2015   Stress at work 10/24/2014   Nonspecific abnormal electrocardiogram (ECG) (EKG) 06/18/2014   S/P colectomy 06/18/2014   Well adult exam 06/18/2014   Renal stones 06/18/2014   Elevated BP 06/18/2014   Headache 04/25/2014   Essential hypertension, benign 04/25/2014   Serous otitis media 03/21/2014   Travel advice encounter 04/17/2013   Calcium nephrolithiasis 10/04/2012   Routine health maintenance 10/04/2012   Acute left flank pain 08/03/2011   Unspecified intestinal obstruction 12/03/2008   Other diseases of nasal cavity and sinuses(478.19) 05/30/2008   GERD 02/26/2008   PEPTIC STRICTURE 02/22/2008   HIATAL HERNIA 02/22/2008   Chest tightness or pressure 01/09/2008   ERECTILE DYSFUNCTION 04/07/2007   Allergic rhinitis 04/07/2007   COLITIS, ULCERATIVE NOS 04/07/2007   GASTRIC POLYP, HX OF 04/07/2007    ONSET DATE: 10/11/2023  REFERRING DIAG: R27.9 (ICD-10-CM) - Discoordination R47.9 (ICD-10-CM) - Difficulty with speech Parkinson's disease with dyskinesia and fluctuating manifestations (HCC)  THERAPY DIAG:  Other lack of coordination  Muscle weakness (generalized)  Unsteadiness on feet  Other symptoms and signs involving the nervous system  Rationale for Evaluation and Treatment: Rehabilitation  SUBJECTIVE:   SUBJECTIVE STATEMENT: No pain  Pt accompanied by: self  PERTINENT HISTORY: PD (diagnosed 2017), MCI, trigeminal neuralgia, LBP   PRECAUTIONS: None  WEIGHT BEARING RESTRICTIONS: No  PAIN:  Are you having pain? No  WEIGHT BEARING RESTRICTIONS: No   FALLS: Has patient fallen in last 6 months? No   LIVING ENVIRONMENT: Lives with: lives with their spouse Lives in: House (2 story) Stairs: Yes: Internal: 13 steps; on right going up and has a regular flight of stairs to get to the gym, but  otherwise does not need to do steps  and External: 3 steps; on right going up Has following equipment at home: None   PLOF: Independent, Vocation/Vocational requirements: Retired, Special educational needs teacher for about 30 years , and Leisure: reading   PATIENT GOALS: Wants to maintain and not feel like he's going to fall. Improve overall strength    OBJECTIVE:  Note: Objective measures were completed at Evaluation unless otherwise noted.  HAND DOMINANCE: Right  ADLs: Overall ADLs: slower, increased difficulty Transfers/ambulation related to ADLs: independent Eating: independent Grooming: independent UB Dressing: having difficulty with buttons on dress shirt and jacket LB Dressing: difficulty with socks Toileting: independent Bathing: independent Tub Shower transfers: independent Equipment: none  IADLs: Shopping: independent  Light housekeeping: pt does day to day cleaning (has cleaning company come in every other week)  Meal Prep: Pt has been doing cooking for past year (d/t wife's thumb and recent surgery) Community mobility: independent Medication management: independent Financial management: independent Handwriting: Mild micrographia (and writes in print, all caps for 30 years)  MOBILITY STATUS: Independent  POSTURE COMMENTS:  rounded shoulders (slightly)    FUNCTIONAL OUTCOME MEASURES: Fastening/unfastening 3 buttons: 21.74 Physical performance test: PPT#2 (simulated eating) 12.04 &  PPT#4 (donning/doffing jacket): 16.58 sec  COORDINATION: 9 Hole Peg test: Right: 28 sec; Left: 28 sec Box and Blocks:  Right 40 blocks, Left 41 blocks Tremors: Resting and postural and more on Rt side  UE ROM:  WFL   SENSATION: WFL   COGNITION: Overall cognitive status:  word finding difficulties  OBSERVATIONS: Bradykinesia and Postural tremors (mild)                                                                                                                    TREATMENT DATE:  11/17/23  OT findings, POC, and goals. Also discussed importance of staying ahead of PD even though symptoms are mild     PATIENT EDUCATION: Education details: see above Person educated: Patient Education method: Explanation Education comprehension: verbalized understanding  HOME EXERCISE PROGRAM: N/A this visit  GOALS: Goals reviewed with patient? Yes   SHORT TERM GOALS: Target date: 12/18/23    Pt will be independent with PD specific HEP.  Baseline: not yet initiated Goal status: INITIAL  2.  Pt will verbalize understanding of adapted strategies to maximize safety and independence with ADLs/IADLs.  Baseline: not yet initiated Goal status: INITIAL  3.  Pt will write a paragraph with no significant decrease in size and maintain 100% legibility.  Baseline: 100% legibility, mild micrographia Goal status: INITIAL  4.  Pt will demonstrate improved fine motor coordination for ADLs as evidenced by decreasing 9 hole peg test score for both hands and by 2 secs  Baseline: 28 secs both hands Goal status: INITIAL  5.  Pt will demonstrate improved ease with fastening buttons as evidenced by decreasing 3 button/unbutton time by 2 seconds  Baseline: 21.74 seconds Goal status: INITIAL  6.  Pt will be able to place at least 45 blocks using Rt and Lt hand with completion of Box and Blocks test.  Baseline: Rt = 40 blocks, Lt = 41 blocks Goal status: INITIAL    LONG TERM GOALS: Target date: 01/17/24    Pt will verbalize understanding of ways to prevent future PD related complications and PD community resources.  Baseline: not yet initiated Goal status: INITIAL   2.  Pt will verbalize understanding of ways to keep thinking skills sharp and ways to compensate for STM changes in the future.  Baseline: not yet initiated Goal status: INITIAL    3.  Pt will demonstrate increased ease with dressing as evidenced by decreasing PPT#4 (don/ doff jacket) to 14 secs or  less.  Baseline: 16.58 secs Goal status: INITIAL  4.  Pt will demonstrate improved fine motor coordination for ADLs as evidenced by decreasing 9 hole peg test score for both hands and by 4 secs  Baseline: 28 secs (bilaterally) Goal status: INITIAL   5.  Pt to report greater ease with donning button up shirts and donning socks Baseline:  Goal status: INITIAL  ASSESSMENT:  CLINICAL IMPRESSION: Patient is a 73 y.o. male who was seen today for occupational therapy evaluation for Parkinson's disease and bradykinesia. Pt was diagnosed with PD in 2017 but has never received O.T. Pt would benefit from O.T. to address deficits listed below, develop PD specific HEP, prevent future related PD complications, and issue community resources.   PERFORMANCE DEFICITS: in functional skills including ADLs, IADLs, coordination, dexterity, ROM, strength, Fine motor control, Gross motor control, mobility, body mechanics, endurance, and UE functional use, cognitive skills including  word finding difficulties  IMPAIRMENTS: are limiting patient from ADLs, IADLs, rest and sleep, leisure, and social participation.   COMORBIDITIES:  may have co-morbidities  that affects occupational performance. Patient will benefit from skilled OT to address above impairments and improve overall function.  MODIFICATION OR ASSISTANCE TO COMPLETE EVALUATION: No modification of tasks or assist necessary to complete an evaluation.  OT OCCUPATIONAL PROFILE AND HISTORY: Detailed assessment: Review of records and additional review of physical, cognitive, psychosocial history related to current functional performance.  CLINICAL DECISION MAKING: Moderate - several treatment options, min-mod task modification necessary  REHAB POTENTIAL: Good  EVALUATION COMPLEXITY: Moderate    PLAN:  OT FREQUENCY: 2x/week  OT DURATION: 8 weeks (PLUS EVAL)   PLANNED INTERVENTIONS: 97535 self care/ADL training, 16109 therapeutic exercise,  97530 therapeutic activity, 97112 neuromuscular re-education, 97140 manual therapy, 97113 aquatic therapy, 97010 moist heat, 97129 Cognitive training (first 15 min), 60454 Cognitive training(each additional 15 min), 09811 Orthotics management and training, 91478 Splinting (initial encounter), [id number] Subsequent splinting/medication, passive range of motion, functional mobility training, visual/perceptual remediation/compensation, patient/family education, and DME and/or AE instructions  RECOMMENDED OTHER SERVICES: none at this time  CONSULTED AND AGREED WITH PLAN OF CARE: Patient  PLAN FOR NEXT SESSION: coordination HEP, PWR! Hands  (Next session: PWR! Seated if P.T. has not already issued)    Sheran Lawless, OT 11/17/2023, 10:00 AM

## 2023-11-17 ENCOUNTER — Ambulatory Visit: Payer: Medicare HMO | Attending: Neurology | Admitting: Speech Pathology

## 2023-11-17 ENCOUNTER — Ambulatory Visit: Payer: Medicare HMO | Admitting: Occupational Therapy

## 2023-11-17 DIAGNOSIS — R471 Dysarthria and anarthria: Secondary | ICD-10-CM | POA: Diagnosis not present

## 2023-11-17 DIAGNOSIS — R29818 Other symptoms and signs involving the nervous system: Secondary | ICD-10-CM

## 2023-11-17 DIAGNOSIS — M6281 Muscle weakness (generalized): Secondary | ICD-10-CM

## 2023-11-17 DIAGNOSIS — R251 Tremor, unspecified: Secondary | ICD-10-CM | POA: Diagnosis not present

## 2023-11-17 DIAGNOSIS — R278 Other lack of coordination: Secondary | ICD-10-CM | POA: Diagnosis not present

## 2023-11-17 DIAGNOSIS — R293 Abnormal posture: Secondary | ICD-10-CM | POA: Diagnosis not present

## 2023-11-17 DIAGNOSIS — R2681 Unsteadiness on feet: Secondary | ICD-10-CM

## 2023-11-17 NOTE — Therapy (Signed)
 OUTPATIENT SPEECH LANGUAGE PATHOLOGY PARKINSON'S TREATMENT   Patient Name: William [name] MRN: [id number] DOB:30-Jan-1951, 73 y.o., male Today's Date: 11/17/2023  PCP: Tresa Garter MD REFERRING PROVIDER: Vladimir Faster, DO  END OF SESSION:  End of Session - 11/17/23 0806     Visit Number 5    Number of Visits 17    Date for SLP Re-Evaluation 12/13/23    Authorization Type [employer]    SLP Start Time [phone]    SLP Stop Time  0845    SLP Time Calculation (min) 39 min    Activity Tolerance Patient tolerated treatment well               Past Medical History:  Diagnosis Date   Allergic rhinitis    Anemia, iron deficiency    Arthritis    BPH associated with nocturia    ED (erectile dysfunction)    GERD (gastroesophageal reflux disease)    Hiatal hernia    History of COVID-19 11/2020   per pt moderate symptoms that resovled   History of hypertension    followed by pcp  (per pt resolved no medication since approx 2017)   History of kidney stones    Left ureteral calculus    Parkinson disease [facility])    neurologist--- dr tat;   mild jaw dyskinesia,   gait disorder, tremors   Peyronie's disease    PONV (postoperative nausea and vomiting)    S/P dilatation of esophageal stricture    Trigeminal neuropathy    left side   Ulcerative colitis    followed by dr Marina Goodell (GI)//   1992 s/p  total abdominal colectomy with ileoanal anastomosis   Wears glasses    Past Surgical History:  Procedure Laterality Date   CRANIECTOMY Left 09/16/2022   Procedure: RETROSIGMOID CRANI FOR MICROVASCULAR DECOMPRESSION;  Surgeon: Bethann Goo, DO;  Location: [facility] OR;  Service: Neurosurgery;  Laterality: Left;   CYSTOSCOPY W/ URETERAL STENT PLACEMENT  2012   for ureteral stricture   CYSTOSCOPY WITH RETROGRADE PYELOGRAM, URETEROSCOPY AND STENT PLACEMENT Left 12/31/2021   Procedure: CYSTOSCOPY WITH RETROGRADE PYELOGRAM, FLUOROSCOPIC INTERPRETATION, URETEROSCOPY  AND STENT PLACEMENT;   Surgeon: Marcine Matar, MD;  Location: [facility];  Service: Urology;  Laterality: Left;   CYSTOSCOPY/URETEROSCOPY/HOLMIUM LASER/STENT PLACEMENT  10/22/2005   @[facility] ;   w/ left ureteral balloon dilatation   HOLMIUM LASER APPLICATION Left 12/31/2021   Procedure: HOLMIUM LASER APPLICATION;  Surgeon: Marcine Matar, MD;  Location: [facility];  Service: Urology;  Laterality: Left;   INGUINAL HERNIA REPAIR     per pt unilateral  approxl 1962   KNEE ARTHROSCOPY Right 02/21/2002   @[facility]    LUMBAR LAMINECTOMY/DECOMPRESSION MICRODISCECTOMY N/A 06/28/2022   Procedure: L4-5 decompression with removal of synovial cyst;  Surgeon: Venita Lick, MD;  Location: [facility] OR;  Service: Orthopedics;  Laterality: N/A;  2.5 hrs 3 C-Bed   NASAL SINUS SURGERY     x2  in 2004   RECONSTRUCTION TENDON PULLEY HAND Right 05/03/2007   @[facility] ;  right long finger   TOTAL COLECTOMY  1992   W/  ILIEOANAL ANASTOMOSIS (for ilieoanal anatomatic stricture , partial bowel obstruction and required peroidic pneumatic dilatation's)   VARICOCELECTOMY     Patient Active Problem List   Diagnosis Date Noted   Pain in finger of right hand 07/13/2023   Acute bacterial bronchitis 12/23/2022   Trigeminal neuralgia 09/16/2022   Synovial cyst of lumbar facet joint 06/28/2022   Degeneration of lumbar  intervertebral disc 01/06/2022   Low back pain 12/22/2021   Trigeminal neuralgia of left side of face 10/26/2021   Somatic dysfunction of right sacroiliac joint 02/18/2020   Cough 12/09/2019   Hypertensive disorder 07/23/2019   Lumbosacral spondylosis without myelopathy 05/01/2019   URI (upper respiratory infection) 01/20/2019   Pain in right knee 02/15/2018   Chest pain 05/28/2017   Hyperlipidemia 05/28/2017   Bradycardia 05/28/2017   Chest pain, atypical 05/27/2017   Lightheadedness 05/27/2017   Finger laceration 04/04/2017   Parkinson's disease (HCC) 02/26/2016   Eustachian tube dysfunction  02/06/2016   Hyponatremia 01/26/2016   Tremor 11/12/2015   Visual-spatial impairment 11/12/2015   Stress at work 10/24/2014   Nonspecific abnormal electrocardiogram (ECG) (EKG) 06/18/2014   S/P colectomy 06/18/2014   Well adult exam 06/18/2014   Renal stones 06/18/2014   Elevated BP 06/18/2014   Headache 04/25/2014   Essential hypertension, benign 04/25/2014   Serous otitis media 03/21/2014   Travel advice encounter 04/17/2013   Calcium nephrolithiasis 10/04/2012   Routine health maintenance 10/04/2012   Acute left flank pain 08/03/2011   Unspecified intestinal obstruction 12/03/2008   Other diseases of nasal cavity and sinuses(478.19) 05/30/2008   GERD 02/26/2008   PEPTIC STRICTURE 02/22/2008   HIATAL HERNIA 02/22/2008   Chest tightness or pressure 01/09/2008   ERECTILE DYSFUNCTION 04/07/2007   Allergic rhinitis 04/07/2007   COLITIS, ULCERATIVE NOS 04/07/2007   GASTRIC POLYP, HX OF 04/07/2007    ONSET DATE: 10/11/23 (referral date)  REFERRING DIAG:  R47.9 (ICD-10-CM) - Difficulty with speech  G20.B2 (ICD-10-CM) - Parkinson's disease with dyskinesia and fluctuating manifestations   THERAPY DIAG:  Dysarthria and anarthria  Rationale for Evaluation and Treatment: Rehabilitation  SUBJECTIVE:   SUBJECTIVE STATEMENT:  Pt accompanied by: self  PERTINENT HISTORY: PMH: PD (2017), MCI, trigeminal neuralgia, LBP Now experiencing changes in balance and communication, including word finding   PAIN:  Are you having pain? Yes: NPRS scale: 3/10 Pain location: right knee Pain description: chronic Aggravating factors: movement  Relieving factors: not moving   FALLS: Has patient fallen in last 6 months?  No  LIVING ENVIRONMENT: Lives with: lives with their spouse Lives in: House/apartment  PLOF:  Level of assistance: Independent with ADLs, Independent with IADLs Employment: Retired Designer, [employer]ell[employer])  PATIENT GOALS: to improve articulation and clarity of speech    OBJECTIVE:  Note: Objective measures were completed at Evaluation unless otherwise noted.                                                                                                                           TREATMENT DATE:  11-17-23: SLP targeted improving vocal quality and increasing intensity through progressively difficulty speech tasks using Speak Out! program, lesson #7. ST lead pt through exercises providing occasional model prior to pt execution. Occasional min-A required to achieve target dB this date. Averages this date:   Sustained "AH": 94 avg. dB Glides 88 dB Counting 85 dB  Reading 84 dB  Conversation exercise 75 dB, Challenge 72 dB Target carryover of skills in informal conversation. Pt demonstrating improved volume and clarity of speech vs baseline entering therapy session. Demonstrating self-correction during unstructured conversation. SLP queries how communication is going at home. Does admit that he struggles with carrying over intentional voice in conversation but reports to being more cognizant of voice/volume and attempting to self-correct as needed.  Does not think he needs to schedule more ST sessions. Pt may benefit from anomia compensations/strategy instruction next session, goal has not been addressed.   11-10-23: SLP targeted improving vocal quality and increasing intensity through progressively difficulty speech tasks using Speak Out! program, lesson #7. ST lead pt through exercises providing occasional model prior to pt execution. Occasional min-A required to achieve target dB this date. Averages this date:  Glides 88 dB Counting 86 dB  Reading 85 dB  Conversation task 78 dB, Challenge 82 dB Target carryover of skills in informal conversation. Pt demonstrating improved volume and clarity of speech vs baseline entering therapy session. Occasional verbal cues needed to ID times of volume decay.   11-08-23: Completed some HEP since last session. Reviewed completing  SO lesson at least once a day before next session. Pt verbalized understanding. Targeted improving vocal quality and increasing intensity through progressively difficulty speech tasks using Speak Out! program, lesson #4. ST leads pt through exercises providing occasional model prior to pt execution. Occasional min-A required to achieve target dB this date. Averages this date:  Sustained "ah" 90 dB Counting 84 dB  Reading 78 dB Conversation task  80 dB  Side comments: 71 dB with occasional cues to maintain average Presented with intermittent throat clearing with cues to take a sip of water. Pt verbalized understanding of SLP recommendations for throat clear alternatives but benefited from cues to aid carryover this session.   11-01-23: Returned with personal Speak Out! Workbook. Watched Parkinson's Disease video on [employer] website, which he was reported was informative. Educated and instructed dysarthria strategies (SLOP) and word finding strategies. Handout provided. Able to demo over-articulation during oral reading with rare min A. Will continue to train in upcoming sessions. Targeted improving vocal quality and increasing intensity through progressively difficulty speech tasks using Speak Out! program, lesson #1. ST leads pt through exercises providing usual model prior to pt execution. occasional min-A required to achieve target dB this date. Averages this date:  Sustained "ah" 85 dB Reading 85 dB Conversation task 75 dB   10-18-23: Provided education re: typical communication changes and challenges related to progression on Parkinson's Disease (PD). Introduced [employer]! Program and principles, with brief review of being intentional and introduction of Parkinson Voice [employer]. Recommended watching What is PD? Video to aid patient comprehension. Ordered Speak Out! Workbook this session. Initiated education and instruction SLOP dysarthria strategies, in which pt able to demo at sentence level with  occasional min A to slow rate. Updated HEP for oral reading 5-10 minutes per day.   PATIENT EDUCATION: Education details: see above Person educated: Patient Education method: Chief Technology Officer Education comprehension: verbalized understanding and needs further education  HOME EXERCISE PROGRAM: Speak Out!   GOALS: Goals reviewed with patient? Yes  SHORT TERM GOALS: Target date: 11/14/2023  Pt will complete daily HEP as recommended across 2 sessions  Goal status: Partially Met, pt reports to intermittent completion   2.  Pt will achieve targeted dB levels in demonstration of Speak Out! Lessons with 90% accuracy given rare min A over 2 sessions  Goal status:  MET  3.  Pt will utilize dysarthria compensations in 10+ minute conversation to be 100% intelligible given occasional min A  Goal status: MET  4.  Pt will utilize word retrieval strategies to aid anomia in 10+ minute conversation given occasional min A  Goal status: NOT ADDRESSED    LONG TERM GOALS: Target date: 12/13/2023  Pt will complete daily HEP at least 1x/day (BID recommended) > 1 week  Baseline:  Goal status: IN PROGRESS  2.  Pt will utilize dysarthria compensations in 15+ minute conversation to optimize vocal intensity and clarity given rare min A over 2 sessions Baseline:  Goal status: IN PROGRESS  3.  Pt will reduce throat clearing by 75% by LTG date Baseline: >5 at eval Goal status: IN PROGRESS  4.  Pt will report improved communication effectiveness via PROM by 2 point improvement by last ST session  Baseline: CPIB=24 Goal status: IN PROGRESS  ASSESSMENT:  CLINICAL IMPRESSION: Patient is a 73 y.o. M who was seen today for communication changes related to progression of Parkinson's Disease. Today, pt presents with mild hypokinetic dysarthria c/b imprecise articulation and intermittent rushes in speech. Continued education and instruction of Speak Out! Program with occasional min A required to  optimize volume. Pt would benefit from skilled ST intervention to optimize communication effectiveness and maximize life participation.   OBJECTIVE IMPAIRMENTS: Objective impairments include dysarthria. These impairments are limiting patient from effectively communicating at home and in community.Factors affecting potential to achieve goals and functional outcome are  PD . Patient will benefit from skilled SLP services to address above impairments and improve overall function.  REHAB POTENTIAL: Excellent  PLAN:  SLP FREQUENCY: 2x/week  SLP DURATION: 8 weeks - anticipate 4 weeks   PLANNED INTERVENTIONS: Language facilitation, Environmental controls, Internal/external aids, Functional tasks, Multimodal communication approach, SLP instruction and feedback, Compensatory strategies, Patient/family education, [phone] Treatment of speech (30 or 45 min) , and 11914- Speech Eval Sound Prod, Articulate, Phonological    [employer], Student-SLP 11/17/2023, 8:06 AM

## 2023-11-21 ENCOUNTER — Ambulatory Visit: Payer: Medicare HMO

## 2023-11-21 ENCOUNTER — Ambulatory Visit: Admitting: Occupational Therapy

## 2023-11-21 DIAGNOSIS — R471 Dysarthria and anarthria: Secondary | ICD-10-CM | POA: Diagnosis not present

## 2023-11-21 DIAGNOSIS — M6281 Muscle weakness (generalized): Secondary | ICD-10-CM

## 2023-11-21 DIAGNOSIS — R29818 Other symptoms and signs involving the nervous system: Secondary | ICD-10-CM | POA: Diagnosis not present

## 2023-11-21 DIAGNOSIS — R278 Other lack of coordination: Secondary | ICD-10-CM | POA: Diagnosis not present

## 2023-11-21 DIAGNOSIS — R251 Tremor, unspecified: Secondary | ICD-10-CM | POA: Diagnosis not present

## 2023-11-21 DIAGNOSIS — R2681 Unsteadiness on feet: Secondary | ICD-10-CM | POA: Diagnosis not present

## 2023-11-21 DIAGNOSIS — R293 Abnormal posture: Secondary | ICD-10-CM | POA: Diagnosis not present

## 2023-11-21 NOTE — Therapy (Signed)
 OUTPATIENT OCCUPATIONAL THERAPY [facility]  Patient Name: William [name] MRN: [id number] DOB:09/09/1951, 73 y.o., male Today's Date: 11/21/2023  PCP: Claudette Head, MD REFERRING PROVIDER: Vladimir Faster, DO  END OF SESSION:  OT End of Session - 11/21/23 0849     Visit Number 2    Number of Visits 17    Date for OT Re-Evaluation 01/17/24    Authorization Type [employer] MCR    Progress Note Due on Visit 10    OT Start Time 0849    OT Stop Time 0930    OT Time Calculation (min) 41 min    Equipment Utilized During Treatment FM objects    Activity Tolerance Patient tolerated treatment well    Behavior During Therapy WFL for tasks assessed/performed             Past Medical History:  Diagnosis Date   Allergic rhinitis    Anemia, iron deficiency    Arthritis    BPH associated with nocturia    ED (erectile dysfunction)    GERD (gastroesophageal reflux disease)    Hiatal hernia    History of COVID-19 11/2020   per pt moderate symptoms that resovled   History of hypertension    followed by pcp  (per pt resolved no medication since approx 2017)   History of kidney stones    Left ureteral calculus    Parkinson disease [facility])    neurologist--- dr tat;   mild jaw dyskinesia,   gait disorder, tremors   Peyronie's disease    PONV (postoperative nausea and vomiting)    S/P dilatation of esophageal stricture    Trigeminal neuropathy    left side   Ulcerative colitis    followed by dr Marina Goodell (GI)//   1992 s/p  total abdominal colectomy with ileoanal anastomosis   Wears glasses    Past Surgical History:  Procedure Laterality Date   CRANIECTOMY Left 09/16/2022   Procedure: RETROSIGMOID CRANI FOR MICROVASCULAR DECOMPRESSION;  Surgeon: Bethann Goo, DO;  Location: [facility] OR;  Service: Neurosurgery;  Laterality: Left;   CYSTOSCOPY W/ URETERAL STENT PLACEMENT  2012   for ureteral stricture   CYSTOSCOPY WITH RETROGRADE PYELOGRAM, URETEROSCOPY AND STENT PLACEMENT Left  12/31/2021   Procedure: CYSTOSCOPY WITH RETROGRADE PYELOGRAM, FLUOROSCOPIC INTERPRETATION, URETEROSCOPY  AND STENT PLACEMENT;  Surgeon: Marcine Matar, MD;  Location: [facility];  Service: Urology;  Laterality: Left;   CYSTOSCOPY/URETEROSCOPY/HOLMIUM LASER/STENT PLACEMENT  10/22/2005   @[facility] ;   w/ left ureteral balloon dilatation   HOLMIUM LASER APPLICATION Left 12/31/2021   Procedure: HOLMIUM LASER APPLICATION;  Surgeon: Marcine Matar, MD;  Location: [facility];  Service: Urology;  Laterality: Left;   INGUINAL HERNIA REPAIR     per pt unilateral  approxl 1962   KNEE ARTHROSCOPY Right 02/21/2002   @[facility]    LUMBAR LAMINECTOMY/DECOMPRESSION MICRODISCECTOMY N/A 06/28/2022   Procedure: L4-5 decompression with removal of synovial cyst;  Surgeon: Venita Lick, MD;  Location[facility] OR;  Service: Orthopedics;  Laterality: N/A;  2.5 hrs 3 C-Bed   NASAL SINUS SURGERY     x2  in 2004   RECONSTRUCTION TENDON PULLEY HAND Right 05/03/2007   @[facility] ;  right long finger   TOTAL COLECTOMY  1992   W/  ILIEOANAL ANASTOMOSIS (for ilieoanal anatomatic stricture , partial bowel obstruction and required peroidic pneumatic dilatation's)   VARICOCELECTOMY     Patient Active Problem List   Diagnosis Date Noted   Pain in finger of right hand 07/13/2023  Acute bacterial bronchitis 12/23/2022   Trigeminal neuralgia 09/16/2022   Synovial cyst of lumbar facet joint 06/28/2022   Degeneration of lumbar intervertebral disc 01/06/2022   Low back pain 12/22/2021   Trigeminal neuralgia of left side of face 10/26/2021   Somatic dysfunction of right sacroiliac joint 02/18/2020   Cough 12/09/2019   Hypertensive disorder 07/23/2019   Lumbosacral spondylosis without myelopathy 05/01/2019   URI (upper respiratory infection) 01/20/2019   Pain in right knee 02/15/2018   Chest pain 05/28/2017   Hyperlipidemia 05/28/2017   Bradycardia 05/28/2017   Chest pain, atypical 05/27/2017    Lightheadedness 05/27/2017   Finger laceration 04/04/2017   Parkinson's disease (HCC) 02/26/2016   Eustachian tube dysfunction 02/06/2016   Hyponatremia 01/26/2016   Tremor 11/12/2015   Visual-spatial impairment 11/12/2015   Stress at work 10/24/2014   Nonspecific abnormal electrocardiogram (ECG) (EKG) 06/18/2014   S/P colectomy 06/18/2014   Well adult exam 06/18/2014   Renal stones 06/18/2014   Elevated BP 06/18/2014   Headache 04/25/2014   Essential hypertension, benign 04/25/2014   Serous otitis media 03/21/2014   Travel advice encounter 04/17/2013   Calcium nephrolithiasis 10/04/2012   Routine health maintenance 10/04/2012   Acute left flank pain 08/03/2011   Unspecified intestinal obstruction 12/03/2008   Other diseases of nasal cavity and sinuses(478.19) 05/30/2008   GERD 02/26/2008   PEPTIC STRICTURE 02/22/2008   HIATAL HERNIA 02/22/2008   Chest tightness or pressure 01/09/2008   ERECTILE DYSFUNCTION 04/07/2007   Allergic rhinitis 04/07/2007   COLITIS, ULCERATIVE NOS 04/07/2007   GASTRIC POLYP, HX OF 04/07/2007    ONSET DATE: 10/11/2023  REFERRING DIAG: R27.9 (ICD-10-CM) - Discoordination R47.9 (ICD-10-CM) - Difficulty with speech Parkinson's disease with dyskinesia and fluctuating manifestations (HCC)  THERAPY DIAG:  Other lack of coordination  Muscle weakness (generalized)  Other symptoms and signs involving the nervous system  Tremor  Rationale for Evaluation and Treatment: Rehabilitation  SUBJECTIVE:   SUBJECTIVE STATEMENT: No pain/falls.  He is awaiting a call from MD re: his wife who recently hand surgery on her hand.  When asked about hobbies, pt reports he likes to cook.   Pt accompanied by: self  PERTINENT HISTORY: PD (diagnosed 2017), MCI, trigeminal neuralgia, LBP   PRECAUTIONS: None  WEIGHT BEARING RESTRICTIONS: No  PAIN:  Are you having pain? No  WEIGHT BEARING RESTRICTIONS: No   FALLS: Has patient fallen in last 6 months? No    LIVING ENVIRONMENT: Lives with: lives with their spouse Lives in: House (2 story) Stairs: Yes: Internal: 13 steps; on right going up and has a regular flight of stairs to get to the gym, but otherwise does not need to do steps  and External: 3 steps; on right going up Has following equipment at home: None   PLOF: Independent, Vocation/Vocational requirements: Retired, Special educational needs teacher for about 30 years , and Leisure: reading   PATIENT GOALS: Wants to maintain and not feel like he's going to fall. Improve overall strength    OBJECTIVE:  Note: Objective measures were completed at Evaluation unless otherwise noted.  HAND DOMINANCE: Right  ADLs: Overall ADLs: slower, increased difficulty Transfers/ambulation related to ADLs: independent Eating: independent Grooming: independent UB Dressing: having difficulty with buttons on dress shirt and jacket LB Dressing: difficulty with socks Toileting: independent Bathing: independent Tub Shower transfers: independent Equipment: none  IADLs: Shopping: independent  Light housekeeping: pt does day to day cleaning (has cleaning company come in every other week)  Meal Prep: Pt has been doing cooking for past  year (d/t wife's thumb and recent surgery) Community mobility: independent Medication management: independent Financial management: independent Handwriting: Mild micrographia (and writes in print, all caps for 30 years)  MOBILITY STATUS: Independent  POSTURE COMMENTS:  rounded shoulders (slightly)    FUNCTIONAL OUTCOME MEASURES: Fastening/unfastening 3 buttons: 21.74 Physical performance test: PPT#2 (simulated eating) 12.04 &  PPT#4 (donning/doffing jacket): 16.58 sec  COORDINATION: 9 Hole Peg test: Right: 28 sec; Left: 28 sec Box and Blocks:  Right 40 blocks, Left 41 blocks Tremors: Resting and postural and more on Rt side  UE ROM:  WFL   SENSATION: WFL   COGNITION: Overall cognitive status:  word finding  difficulties  OBSERVATIONS: Bradykinesia and Postural tremors (mild)                                                                                                                    TREATMENT DATE: 11/21/23  Pt education initiated in using big, deliberate movements with B UE to decrease stiffness, increase digital ROM/extension, decrease tremulous motions and overall increase UE coordination for different fine motor activities.   Coordination Activity handout with images provided for various activities to work on B UE finger ROM, dexterity and isolated movements with demonstration and practice, as well as modification, visual and verbal cues throughout to improve technique, digital isolation and ease of performing task.  Tasks included:  Pick up coins, dominoes, nuts/bolts, dice and other objects of different sizes ... To place in containers To stack - with guidance to work on include/isolate specific fingers. To pick up items one at a time until patient got 5+ in their hand and then move item from palm to fingertips to release ie) Finger-to-palm then palm-to-finger translation of small items - Options to vary difficulty include using a washcloth under items like coins or using larger items (checkers vs coins or blocks/dominos vs dice) for increased ease of picking up items.  Shuffling, Flipping and dealing cards 1 at a time. -- Setup patient to work on shuffling, dealing cards, focusing on using index finger with thumb to flip cards or holding deck of cards in palm of hand     Rotate golf balls (clockwise and counter-clockwise) with forearm pronated and balls on table or supinated and balls in hand.  Pt had trouble with task in palm initially but improved s/p practicing on table top and when using weight Chinese medicine balls    Twirl pen/cil between fingers. - Encouragement to isolate fingers individually and "twirl" (rotation) or flipping and shift up and down the pen (translation) to get  it in position for writing or erasing.    Tear a piece of paper towel and roll it into small balls with fingertips ie) straw wrapping when eating out.    Patient is encouraged to take breaks, relax arm/shoulder by supporting forearm, minimize compensatory motions and a try different activities throughout the day/week including games like Dorisann Frames (for the dice), card games, Connect 4, puzzles etc.   Patient benefited from extra time, verbal/tactile  cues, and modeling of task to allow time for processing of verbal instructions and improve motor planning of unfamiliar movements.     PATIENT EDUCATION: Education details: Coordination activities Person educated: Patient Education method: Programmer, multimedia, Facilities manager, Verbal cues, and Handouts Education comprehension: verbalized understanding, returned demonstration, verbal cues required, and needs further education  HOME EXERCISE PROGRAM: 11/21/23: Coordination Activities  GOALS: Goals reviewed with patient? Yes   SHORT TERM GOALS: Target date: 12/18/23    Pt will be independent with PD specific HEP. Baseline: not yet initiated Goal status: IN Progress  2.  Pt will verbalize understanding of adapted strategies to maximize safety and independence with ADLs/IADLs. Baseline: not yet initiated Goal status: INITIAL  3.  Pt will write a paragraph with no significant decrease in size and maintain 100% legibility. Baseline: 100% legibility, mild micrographia Goal status: IN Progress  4.  Pt will demonstrate improved fine motor coordination for ADLs as evidenced by decreasing 9 hole peg test score for both hands and by 2 secs Baseline: 28 secs both hands Goal status: IN Progress  5.  Pt will demonstrate improved ease with fastening buttons as evidenced by decreasing 3 button/unbutton time by 2 seconds Baseline: 21.74 seconds Goal status: IN Progress  6.  Pt will be able to place at least 45 blocks using Rt and Lt hand with completion of Box  and Blocks test. Baseline: Rt = 40 blocks, Lt = 41 blocks Goal status: IN Progress    LONG TERM GOALS: Target date: 01/17/24    Pt will verbalize understanding of ways to prevent future PD related complications and PD community resources. Baseline: not yet initiated Goal status: INITIAL  2.  Pt will verbalize understanding of ways to keep thinking skills sharp and ways to compensate for STM changes in the future. Baseline: not yet initiated Goal status: INITIAL  3.  Pt will demonstrate increased ease with dressing as evidenced by decreasing PPT#4 (don/ doff jacket) to 14 secs or less. Baseline: 16.58 secs Goal status: INITIAL  4.  Pt will demonstrate improved fine motor coordination for ADLs as evidenced by decreasing 9 hole peg test score for both hands and by 4 secs Baseline: 28 secs (bilaterally) Goal status: INITIAL   5.  Pt to report greater ease with donning button up shirts and donning socks Baseline:  Goal status: INITIAL     ASSESSMENT:  CLINICAL IMPRESSION: Patient is a 73 y.o. male who was seen today for occupational therapy treatment for Parkinson's disease and bradykinesia. Pt was diagnosed with PD in 2017 but has never received O.T. Pt responded well to instruction in various FM activities for coordination and in hand manipulation today.  Pt will benefit from further O.T. to address deficits listed below, develop PD specific HEP, prevent future related PD complications, and issue community resources.   PERFORMANCE DEFICITS: in functional skills including ADLs, IADLs, coordination, dexterity, ROM, strength, Fine motor control, Gross motor control, mobility, body mechanics, endurance, and UE functional use, cognitive skills including  word finding difficulties  IMPAIRMENTS: are limiting patient from ADLs, IADLs, rest and sleep, leisure, and social participation.   COMORBIDITIES:  may have co-morbidities  that affects occupational performance. Patient will benefit  from skilled OT to address above impairments and improve overall function.  REHAB POTENTIAL: Good   PLAN:  OT FREQUENCY: 2x/week  OT DURATION: 8 weeks (PLUS EVAL)   PLANNED INTERVENTIONS: 97535 self care/ADL training, 16109 therapeutic exercise, 97530 therapeutic activity, 97112 neuromuscular re-education, 97140 manual therapy, [id number] aquatic therapy,  97010 moist heat, [id number] Cognitive training (first 15 min), 16109 Cognitive training(each additional 15 min), 60454 Orthotics management and training, 09811 Splinting (initial encounter), [phone] Subsequent splinting/medication, passive range of motion, functional mobility training, visual/perceptual remediation/compensation, patient/family education, and DME and/or AE instructions  RECOMMENDED OTHER SERVICES: none at this time  CONSULTED AND AGREED WITH PLAN OF CARE: Patient  PLAN FOR NEXT SESSION:  Review coordination HEP Issue PWR! Hands  (Next session: PWR! Seated if P.T. has not already issued)    Victorino Sparrow, OT 11/21/2023, 10:00 AM

## 2023-11-21 NOTE — Therapy (Signed)
 OUTPATIENT SPEECH LANGUAGE PATHOLOGY PARKINSON'S TREATMENT   Patient Name: William [name] MRN: [id number] DOB:02/20/1951, 73 y.o., male Today's Date: 11/21/2023  PCP: Tresa Garter MD REFERRING PROVIDER: Vladimir Faster, DO  END OF SESSION:  End of Session - 11/21/23 0925     Visit Number 6    Number of Visits 17    Date for SLP Re-Evaluation 12/13/23    Authorization Type [employer]    SLP Start Time 0935    SLP Stop Time  1015    SLP Time Calculation (min) 40 min    Activity Tolerance Patient tolerated treatment well              Past Medical History:  Diagnosis Date   Allergic rhinitis    Anemia, iron deficiency    Arthritis    BPH associated with nocturia    ED (erectile dysfunction)    GERD (gastroesophageal reflux disease)    Hiatal hernia    History of COVID-19 11/2020   per pt moderate symptoms that resovled   History of hypertension    followed by pcp  (per pt resolved no medication since approx 2017)   History of kidney stones    Left ureteral calculus    Parkinson disease [facility])    neurologist--- dr tat;   mild jaw dyskinesia,   gait disorder, tremors   Peyronie's disease    PONV (postoperative nausea and vomiting)    S/P dilatation of esophageal stricture    Trigeminal neuropathy    left side   Ulcerative colitis    followed by dr Marina Goodell (GI)//   1992 s/p  total abdominal colectomy with ileoanal anastomosis   Wears glasses    Past Surgical History:  Procedure Laterality Date   CRANIECTOMY Left 09/16/2022   Procedure: RETROSIGMOID CRANI FOR MICROVASCULAR DECOMPRESSION;  Surgeon: Bethann Goo, DO;  Location: [facility] OR;  Service: Neurosurgery;  Laterality: Left;   CYSTOSCOPY W/ URETERAL STENT PLACEMENT  2012   for ureteral stricture   CYSTOSCOPY WITH RETROGRADE PYELOGRAM, URETEROSCOPY AND STENT PLACEMENT Left 12/31/2021   Procedure: CYSTOSCOPY WITH RETROGRADE PYELOGRAM, FLUOROSCOPIC INTERPRETATION, URETEROSCOPY  AND STENT PLACEMENT;   Surgeon: Marcine Matar, MD;  Location: [facility];  Service: Urology;  Laterality: Left;   CYSTOSCOPY/URETEROSCOPY/HOLMIUM LASER/STENT PLACEMENT  10/22/2005   @[facility] ;   w/ left ureteral balloon dilatation   HOLMIUM LASER APPLICATION Left 12/31/2021   Procedure: HOLMIUM LASER APPLICATION;  Surgeon: Marcine Matar, MD;  Location: [facility];  Service: Urology;  Laterality: Left;   INGUINAL HERNIA REPAIR     per pt unilateral  approxl 1962   KNEE ARTHROSCOPY Right 02/21/2002   @[facility]    LUMBAR LAMINECTOMY/DECOMPRESSION MICRODISCECTOMY N/A 06/28/2022   Procedure: L4-5 decompression with removal of synovial cyst;  Surgeon: Venita Lick, MD;  Location: [facility] OR;  Service: Orthopedics;  Laterality: N/A;  2.5 hrs 3 C-Bed   NASAL SINUS SURGERY     x2  in 2004   RECONSTRUCTION TENDON PULLEY HAND Right 05/03/2007   @[facility] ;  right long finger   TOTAL COLECTOMY  1992   W/  ILIEOANAL ANASTOMOSIS (for ilieoanal anatomatic stricture , partial bowel obstruction and required peroidic pneumatic dilatation's)   VARICOCELECTOMY     Patient Active Problem List   Diagnosis Date Noted   Pain in finger of right hand 07/13/2023   Acute bacterial bronchitis 12/23/2022   Trigeminal neuralgia 09/16/2022   Synovial cyst of lumbar facet joint 06/28/2022   Degeneration of lumbar intervertebral  disc 01/06/2022   Low back pain 12/22/2021   Trigeminal neuralgia of left side of face 10/26/2021   Somatic dysfunction of right sacroiliac joint 02/18/2020   Cough 12/09/2019   Hypertensive disorder 07/23/2019   Lumbosacral spondylosis without myelopathy 05/01/2019   URI (upper respiratory infection) 01/20/2019   Pain in right knee 02/15/2018   Chest pain 05/28/2017   Hyperlipidemia 05/28/2017   Bradycardia 05/28/2017   Chest pain, atypical 05/27/2017   Lightheadedness 05/27/2017   Finger laceration 04/04/2017   Parkinson's disease (HCC) 02/26/2016   Eustachian tube dysfunction  02/06/2016   Hyponatremia 01/26/2016   Tremor 11/12/2015   Visual-spatial impairment 11/12/2015   Stress at work 10/24/2014   Nonspecific abnormal electrocardiogram (ECG) (EKG) 06/18/2014   S/P colectomy 06/18/2014   Well adult exam 06/18/2014   Renal stones 06/18/2014   Elevated BP 06/18/2014   Headache 04/25/2014   Essential hypertension, benign 04/25/2014   Serous otitis media 03/21/2014   Travel advice encounter 04/17/2013   Calcium nephrolithiasis 10/04/2012   Routine health maintenance 10/04/2012   Acute left flank pain 08/03/2011   Unspecified intestinal obstruction 12/03/2008   Other diseases of nasal cavity and sinuses(478.19) 05/30/2008   GERD 02/26/2008   PEPTIC STRICTURE 02/22/2008   HIATAL HERNIA 02/22/2008   Chest tightness or pressure 01/09/2008   ERECTILE DYSFUNCTION 04/07/2007   Allergic rhinitis 04/07/2007   COLITIS, ULCERATIVE NOS 04/07/2007   GASTRIC POLYP, HX OF 04/07/2007    ONSET DATE: 10/11/23 (referral date)  REFERRING DIAG:  R47.9 (ICD-10-CM) - Difficulty with speech  G20.B2 (ICD-10-CM) - Parkinson's disease with dyskinesia and fluctuating manifestations   THERAPY DIAG:  Dysarthria and anarthria  Rationale for Evaluation and Treatment: Rehabilitation  SUBJECTIVE:   SUBJECTIVE STATEMENT: "my voice is stronger"  Pt accompanied by: self  PERTINENT HISTORY: PMH: PD (2017), MCI, trigeminal neuralgia, LBP Now experiencing changes in balance and communication, including word finding   PAIN:  Are you having pain? Yes: NPRS scale: 3/10 Pain location: right knee Pain description: chronic Aggravating factors: movement  Relieving factors: not moving   FALLS: Has patient fallen in last 6 months?  No  LIVING ENVIRONMENT: Lives with: lives with their spouse Lives in: House/apartment  PLOF:  Level of assistance: Independent with ADLs, Independent with IADLs Employment: Retired Designer, [employer])  PATIENT GOALS: to improve articulation and  clarity of speech   OBJECTIVE:  Note: Objective measures were completed at Evaluation unless otherwise noted.                                                                                                                           TREATMENT DATE:  11-21-23: SLP targeted maintaining intent and WNL conversational volume during a category naming language task targeting anomia. Pt participated effectively with min-A maintaining average volume of 71 dB. SLP continued targeting anomia and carry over of intentional voice into unstructured conversation tasks. Pt maintained appropriate conversational volume and displayed overt no word finding difficulties. SLP inquired about pt's  word finding concerns with pt sharing daily instances of anomia, however primarily occurring when fatigued. SLP advised pt to be cognizant of these instances when they occur and provided pt education on word finding strategies to aid recall in these moments.   11-17-23: SLP targeted improving vocal quality and increasing intensity through progressively difficulty speech tasks using Speak Out! program, lesson #7. ST lead pt through exercises providing occasional model prior to pt execution. Occasional min-A required to achieve target dB this date. Averages this date:   Sustained "AH": 94 avg. dB Glides 88 dB Counting 85 dB  Reading 84 dB  Conversation exercise 75 dB, Challenge 72 dB Target carryover of skills in informal conversation. Pt demonstrating improved volume and clarity of speech vs baseline entering therapy session. Demonstrating self-correction during unstructured conversation. SLP queries how communication is going at home. Does admit that he struggles with carrying over intentional voice in conversation but reports to being more cognizant of voice/volume and attempting to self-correct as needed.  Does not think he needs to schedule more ST sessions. Pt may benefit from anomia compensations/strategy instruction next session,  goal has not been addressed.   11-10-23: SLP targeted improving vocal quality and increasing intensity through progressively difficulty speech tasks using Speak Out! program, lesson #7. ST lead pt through exercises providing occasional model prior to pt execution. Occasional min-A required to achieve target dB this date. Averages this date:  Glides 88 dB Counting 86 dB  Reading 85 dB  Conversation task 78 dB, Challenge 82 dB Target carryover of skills in informal conversation. Pt demonstrating improved volume and clarity of speech vs baseline entering therapy session. Occasional verbal cues needed to ID times of volume decay.   11-08-23: Completed some HEP since last session. Reviewed completing SO lesson at least once a day before next session. Pt verbalized understanding. Targeted improving vocal quality and increasing intensity through progressively difficulty speech tasks using Speak Out! program, lesson #4. ST leads pt through exercises providing occasional model prior to pt execution. Occasional min-A required to achieve target dB this date. Averages this date:  Sustained "ah" 90 dB Counting 84 dB  Reading 78 dB Conversation task  80 dB  Side comments: 71 dB with occasional cues to maintain average Presented with intermittent throat clearing with cues to take a sip of water. Pt verbalized understanding of SLP recommendations for throat clear alternatives but benefited from cues to aid carryover this session.   11-01-23: Returned with personal Speak Out! Workbook. Watched Parkinson's Disease video on [employer] website, which he was reported was informative. Educated and instructed dysarthria strategies (SLOP) and word finding strategies. Handout provided. Able to demo over-articulation during oral reading with rare min A. Will continue to train in upcoming sessions. Targeted improving vocal quality and increasing intensity through progressively difficulty speech tasks using Speak Out! program, lesson  #1. ST leads pt through exercises providing usual model prior to pt execution. occasional min-A required to achieve target dB this date. Averages this date:  Sustained "ah" 85 dB Reading 85 dB Conversation task 75 dB   10-18-23: Provided education re: typical communication changes and challenges related to progression on Parkinson's Disease (PD). Introduced [employer]! Program and principles, with brief review of being intentional and introduction of Parkinson Voice [employer]. Recommended watching What is PD? Video to aid patient comprehension. Ordered Speak Out! Workbook this session. Initiated education and instruction SLOP dysarthria strategies, in which pt able to demo at sentence level with occasional min A to slow  rate. Updated HEP for oral reading 5-10 minutes per day.   PATIENT EDUCATION: Education details: see above Person educated: Patient Education method: Chief Technology Officer Education comprehension: verbalized understanding and needs further education  HOME EXERCISE PROGRAM: Speak Out!   GOALS: Goals reviewed with patient? Yes  SHORT TERM GOALS: Target date: 11/14/2023  Pt will complete daily HEP as recommended across 2 sessions  Baseline:  Goal status: PARTIALLY MET (pt reports to intermittent completion)   2.  Pt will achieve targeted dB levels in demonstration of Speak Out! Lessons with 90% accuracy given rare min A over 2 sessions  Baseline:  Goal status: MET  3.  Pt will utilize dysarthria compensations in 10+ minute conversation to be 100% intelligible given occasional min A  Baseline:  Goal status: MET  4.  Pt will utilize word retrieval strategies to aid anomia in 10+ minute conversation given occasional min A  Baseline:  Goal status: DEFERRED (educated word retrieval strategies; however no instances of anomia demonstrated during sessions thus far)     LONG TERM GOALS: Target date: 12/13/2023  Pt will complete daily HEP at least 1x/day (BID  recommended) > 1 week  Baseline:  Goal status: IN PROGRESS  2.  Pt will utilize dysarthria compensations in 15+ minute conversation to optimize vocal intensity and clarity given rare min A over 2 sessions Baseline:  11-21-23 Goal status: IN PROGRESS  3.  Pt will reduce throat clearing by 75% by LTG date Baseline: >5 at eval Goal status: IN PROGRESS  4.  Pt will report improved communication effectiveness via PROM by 2 point improvement by last ST session  Baseline: CPIB=24 Goal status: IN PROGRESS  ASSESSMENT:  CLINICAL IMPRESSION: Patient is a 73 y.o. M who was seen today for communication changes related to progression of Parkinson's Disease. Today, pt presents with mild hypokinetic dysarthria c/b rare imprecise articulation and rare rushes in speech. Continued targeting WNL vocal intensity and use of intentional voice during language tasks and unstructured conversation with pt benefiting from rare min A. SLP targeted anomia d/t previously noted concern with word finding, however no instances of anomia evidenced throughout the session. Pt education provided on strategies to aid moments of word retrieval difficulty to support carryover outside of sessions when moments do occur. Pt continues to benefit from skilled ST intervention to optimize communication effectiveness and maximize life participation.   OBJECTIVE IMPAIRMENTS: Objective impairments include dysarthria. These impairments are limiting patient from effectively communicating at home and in community.Factors affecting potential to achieve goals and functional outcome are  PD . Patient will benefit from skilled SLP services to address above impairments and improve overall function.  REHAB POTENTIAL: Excellent  PLAN:  SLP FREQUENCY: 2x/week  SLP DURATION: 8 weeks - anticipate 4 weeks   PLANNED INTERVENTIONS: Language facilitation, Environmental controls, Internal/external aids, Functional tasks, Multimodal communication  approach, SLP instruction and feedback, Compensatory strategies, Patient/family education, [phone] Treatment of speech (30 or 45 min) , and [id number]- Speech Eval Sound Prod, Articulate, Phonological    Gracy Racer, CCC-SLP 11/21/2023, 10:42 AM

## 2023-11-23 ENCOUNTER — Ambulatory Visit: Payer: Medicare HMO

## 2023-11-23 DIAGNOSIS — R2681 Unsteadiness on feet: Secondary | ICD-10-CM | POA: Diagnosis not present

## 2023-11-23 DIAGNOSIS — M6281 Muscle weakness (generalized): Secondary | ICD-10-CM | POA: Diagnosis not present

## 2023-11-23 DIAGNOSIS — R471 Dysarthria and anarthria: Secondary | ICD-10-CM

## 2023-11-23 DIAGNOSIS — R251 Tremor, unspecified: Secondary | ICD-10-CM | POA: Diagnosis not present

## 2023-11-23 DIAGNOSIS — R293 Abnormal posture: Secondary | ICD-10-CM | POA: Diagnosis not present

## 2023-11-23 DIAGNOSIS — R278 Other lack of coordination: Secondary | ICD-10-CM | POA: Diagnosis not present

## 2023-11-23 DIAGNOSIS — R29818 Other symptoms and signs involving the nervous system: Secondary | ICD-10-CM | POA: Diagnosis not present

## 2023-11-23 NOTE — Patient Instructions (Signed)
For maintenance after Speech Therapy,   Continue to practice being intentional with your speech and focus on being "louder than normal"    You can re-start the Speak Out! Workbook from the beginning and do it 1-2x/day    OR you can do live or recorded practices on the [employer] 1-2x/day.    If you are struggling to keep your voice loud again in conversation, be really loud (do a loud ah or say a few statements in a really loud voice) to help you re-calibrate your volume

## 2023-11-23 NOTE — Therapy (Signed)
 OUTPATIENT SPEECH LANGUAGE PATHOLOGY PARKINSON'S TREATMENT (DISCHARGE)   Patient Name: William [name] MRN: [id number] DOB:Jul 21, 1951, 73 y.o., male Today's Date: 11/23/2023  PCP: Tresa Garter MD REFERRING PROVIDER: Vladimir Faster, DO  END OF SESSION:  End of Session - 11/23/23 0933     Visit Number 7    Number of Visits 17    Date for SLP Re-Evaluation 12/13/23    Authorization Type [employer]    SLP Start Time [phone]    SLP Stop Time  1015    SLP Time Calculation (min) 42 min    Activity Tolerance Patient tolerated treatment well            SPEECH THERAPY DISCHARGE SUMMARY  Visits from Start of Care: 7  Current functional level related to goals / functional outcomes: Pt presents with increased volume and clear, intentional speech with rare min-A in conversation   Remaining deficits: Dysarthria d/t PD   Education / Equipment: Speak Out! Program, dysarthria strategies, throat clear suppression strategies   Patient agrees to discharge. Patient goals were partially met. Patient is being discharged due to being pleased with the current functional level..     Past Medical History:  Diagnosis Date   Allergic rhinitis    Anemia, iron deficiency    Arthritis    BPH associated with nocturia    ED (erectile dysfunction)    GERD (gastroesophageal reflux disease)    Hiatal hernia    History of COVID-19 11/2020   per pt moderate symptoms that resovled   History of hypertension    followed by pcp  (per pt resolved no medication since approx 2017)   History of kidney stones    Left ureteral calculus    Parkinson disease [facility])    neurologist--- dr tat;   mild jaw dyskinesia,   gait disorder, tremors   Peyronie's disease    PONV (postoperative nausea and vomiting)    S/P dilatation of esophageal stricture    Trigeminal neuropathy    left side   Ulcerative colitis    followed by dr Marina Goodell (GI)//   1992 s/p  total abdominal colectomy with ileoanal anastomosis    Wears glasses    Past Surgical History:  Procedure Laterality Date   CRANIECTOMY Left 09/16/2022   Procedure: RETROSIGMOID CRANI FOR MICROVASCULAR DECOMPRESSION;  Surgeon: Bethann Goo, DO;  Location: [facility] OR;  Service: Neurosurgery;  Laterality: Left;   CYSTOSCOPY W/ URETERAL STENT PLACEMENT  2012   for ureteral stricture   CYSTOSCOPY WITH RETROGRADE PYELOGRAM, URETEROSCOPY AND STENT PLACEMENT Left 12/31/2021   Procedure: CYSTOSCOPY WITH RETROGRADE PYELOGRAM, FLUOROSCOPIC INTERPRETATION, URETEROSCOPY  AND STENT PLACEMENT;  Surgeon: Marcine Matar, MD;  [facility];  Service: Urology;  Laterality: Left;   CYSTOSCOPY/URETEROSCOPY/HOLMIUM LASER/STENT PLACEMENT  10/22/2005   @[facility] ;   w/ left ureteral balloon dilatation   HOLMIUM LASER APPLICATION Left 12/31/2021   Procedure: HOLMIUM LASER APPLICATION;  Surgeon: Marcine Matar, MD;  Location: [facility];  Service: Urology;  Laterality: Left;   INGUINAL HERNIA REPAIR     per pt unilateral  approxl 1962   KNEE ARTHROSCOPY Right 02/21/2002   @[facility]    LUMBAR LAMINECTOMY/DECOMPRESSION MICRODISCECTOMY N/A 06/28/2022   Procedure: L4-5 decompression with removal of synovial cyst;  Surgeon: Venita Lick, MD;  Location: [facility] OR;  Service: Orthopedics;  Laterality: N/A;  2.5 hrs 3 C-Bed   NASAL SINUS SURGERY     x2  in 2004   RECONSTRUCTION TENDON PULLEY HAND Right 05/03/2007   @  [facility];  right long finger   TOTAL COLECTOMY  1992   W/  ILIEOANAL ANASTOMOSIS (for ilieoanal anatomatic stricture , partial bowel obstruction and required peroidic pneumatic dilatation's)   VARICOCELECTOMY     Patient Active Problem List   Diagnosis Date Noted   Pain in finger of right hand 07/13/2023   Acute bacterial bronchitis 12/23/2022   Trigeminal neuralgia 09/16/2022   Synovial cyst of lumbar facet joint 06/28/2022   Degeneration of lumbar intervertebral disc 01/06/2022   Low back pain 12/22/2021   Trigeminal  neuralgia of left side of face 10/26/2021   Somatic dysfunction of right sacroiliac joint 02/18/2020   Cough 12/09/2019   Hypertensive disorder 07/23/2019   Lumbosacral spondylosis without myelopathy 05/01/2019   URI (upper respiratory infection) 01/20/2019   Pain in right knee 02/15/2018   Chest pain 05/28/2017   Hyperlipidemia 05/28/2017   Bradycardia 05/28/2017   Chest pain, atypical 05/27/2017   Lightheadedness 05/27/2017   Finger laceration 04/04/2017   Parkinson's disease (HCC) 02/26/2016   Eustachian tube dysfunction 02/06/2016   Hyponatremia 01/26/2016   Tremor 11/12/2015   Visual-spatial impairment 11/12/2015   Stress at work 10/24/2014   Nonspecific abnormal electrocardiogram (ECG) (EKG) 06/18/2014   S/P colectomy 06/18/2014   Well adult exam 06/18/2014   Renal stones 06/18/2014   Elevated BP 06/18/2014   Headache 04/25/2014   Essential hypertension, benign 04/25/2014   Serous otitis media 03/21/2014   Travel advice encounter 04/17/2013   Calcium nephrolithiasis 10/04/2012   Routine health maintenance 10/04/2012   Acute left flank pain 08/03/2011   Unspecified intestinal obstruction 12/03/2008   Other diseases of nasal cavity and sinuses(478.19) 05/30/2008   GERD 02/26/2008   PEPTIC STRICTURE 02/22/2008   HIATAL HERNIA 02/22/2008   Chest tightness or pressure 01/09/2008   ERECTILE DYSFUNCTION 04/07/2007   Allergic rhinitis 04/07/2007   COLITIS, ULCERATIVE NOS 04/07/2007   GASTRIC POLYP, HX OF 04/07/2007    ONSET DATE: 10/11/23 (referral date)  REFERRING DIAG:  R47.9 (ICD-10-CM) - Difficulty with speech  G20.B2 (ICD-10-CM) - Parkinson's disease with dyskinesia and fluctuating manifestations   THERAPY DIAG:  Dysarthria and anarthria  Rationale for Evaluation and Treatment: Rehabilitation  SUBJECTIVE:   SUBJECTIVE STATEMENT: Pt endorsed consistent practice with Speak Out! Pt accompanied by: self  PERTINENT HISTORY: PMH: PD (2017), MCI, trigeminal  neuralgia, LBP Now experiencing changes in balance and communication, including word finding   PAIN:  Are you having pain? Yes: NPRS scale: 3/10 Pain location: right knee Pain description: chronic Aggravating factors: movement  Relieving factors: not moving   FALLS: Has patient fallen in last 6 months?  No  LIVING ENVIRONMENT: Lives with: lives with their spouse Lives in: House/apartment  PLOF:  Level of assistance: Independent with ADLs, Independent with IADLs Employment: Retired Designer, [employer])  PATIENT GOALS: to improve articulation and clarity of speech   OBJECTIVE:  Note: Objective measures were completed at Evaluation unless otherwise noted.  TREATMENT DATE:  11-23-23: SLP guided patient through Speak Out! lesson #13 with pt maintaining increased volume and clear intentional speech given min-A and intermittent cuing to maintain volume in side conversation. Pt demonstrated occasional throat clear with SLP prompting pt to sip water as needed and utilize hard swallow. SLP recorded pts voice responding to structured Speak Out questions, providing biofeedback comparing today's recording with recording completed at evaluation. Pt verbalized awareness of progress in using a clear intentional voice in response to recording. Provided pt education regarding maintenance of intentional voice after therapy, and the importance of consistent daily practice. Pt agreed to discharge and endorsed continual Speak Out! practice. Speak Out! Averages: Sustained "AH":  98 dB Glides 92 dB Counting 85 dB  Reading 80 dB  Conversation exercise 78 dB, Unstructured conversation 72 dB  11-21-23: SLP targeted maintaining intent and WNL conversational volume during a category naming language task targeting anomia. Pt participated effectively with min-A maintaining average volume of 71 dB.  SLP continued targeting anomia and carry over of intentional voice into unstructured conversation tasks. Pt maintained appropriate conversational volume and displayed overt no word finding difficulties. SLP inquired about pt's word finding concerns with pt sharing daily instances of anomia, however primarily occurring when fatigued. SLP advised pt to be cognizant of these instances when they occur and provided pt education on word finding strategies to aid recall in these moments.   11-17-23: SLP targeted improving vocal quality and increasing intensity through progressively difficulty speech tasks using Speak Out! program, lesson #7. ST lead pt through exercises providing occasional model prior to pt execution. Occasional min-A required to achieve target dB this date. Averages this date:   Sustained "AH": 94 avg. dB Glides 88 dB Counting 85 dB  Reading 84 dB  Conversation exercise 75 dB, Challenge 72 dB Target carryover of skills in informal conversation. Pt demonstrating improved volume and clarity of speech vs baseline entering therapy session. Demonstrating self-correction during unstructured conversation. SLP queries how communication is going at home. Does admit that he struggles with carrying over intentional voice in conversation but reports to being more cognizant of voice/volume and attempting to self-correct as needed.  Does not think he needs to schedule more ST sessions. Pt may benefit from anomia compensations/strategy instruction next session, goal has not been addressed.   11-10-23: SLP targeted improving vocal quality and increasing intensity through progressively difficulty speech tasks using Speak Out! program, lesson #7. ST lead pt through exercises providing occasional model prior to pt execution. Occasional min-A required to achieve target dB this date. Averages this date:  Glides 88 dB Counting 86 dB  Reading 85 dB  Conversation task 78 dB, Challenge 82 dB Target carryover of  skills in informal conversation. Pt demonstrating improved volume and clarity of speech vs baseline entering therapy session. Occasional verbal cues needed to ID times of volume decay.   11-08-23: Completed some HEP since last session. Reviewed completing SO lesson at least once a day before next session. Pt verbalized understanding. Targeted improving vocal quality and increasing intensity through progressively difficulty speech tasks using Speak Out! program, lesson #4. ST leads pt through exercises providing occasional model prior to pt execution. Occasional min-A required to achieve target dB this date. Averages this date:  Sustained "ah" 90 dB Counting 84 dB  Reading 78 dB Conversation task  80 dB  Side comments: 71 dB with occasional cues to maintain average Presented with intermittent throat clearing with cues to take a sip of water. Pt verbalized  understanding of SLP recommendations for throat clear alternatives but benefited from cues to aid carryover this session.   11-01-23: Returned with personal Speak Out! Workbook. Watched Parkinson's Disease video on [employer] website, which he was reported was informative. Educated and instructed dysarthria strategies (SLOP) and word finding strategies. Handout provided. Able to demo over-articulation during oral reading with rare min A. Will continue to train in upcoming sessions. Targeted improving vocal quality and increasing intensity through progressively difficulty speech tasks using Speak Out! program, lesson #1. ST leads pt through exercises providing usual model prior to pt execution. occasional min-A required to achieve target dB this date. Averages this date:  Sustained "ah" 85 dB Reading 85 dB Conversation task 75 dB   10-18-23: Provided education re: typical communication changes and challenges related to progression on Parkinson's Disease (PD). Introduced [employer]! Program and principles, with brief review of being intentional and introduction of  Parkinson Voice [employer]. Recommended watching What is PD? Video to aid patient comprehension. Ordered Speak Out! Workbook this session. Initiated education and instruction SLOP dysarthria strategies, in which pt able to demo at sentence level with occasional min A to slow rate. Updated HEP for oral reading 5-10 minutes per day.   PATIENT EDUCATION: Education details: see above Person educated: Patient Education method: Chief Technology Officer Education comprehension: verbalized understanding and needs further education  HOME EXERCISE PROGRAM: Speak Out!   GOALS: Goals reviewed with patient? Yes  SHORT TERM GOALS: Target date: 11/14/2023  Pt will complete daily HEP as recommended across 2 sessions  Baseline:  Goal status: PARTIALLY MET (pt reports to intermittent completion)   2.  Pt will achieve targeted dB levels in demonstration of Speak Out! Lessons with 90% accuracy given rare min A over 2 sessions  Baseline:  Goal status: MET  3.  Pt will utilize dysarthria compensations in 10+ minute conversation to be 100% intelligible given occasional min A  Baseline:  Goal status: MET  4.  Pt will utilize word retrieval strategies to aid anomia in 10+ minute conversation given occasional min A  Baseline:  Goal status: DEFERRED (educated word retrieval strategies; however no instances of anomia demonstrated during sessions thus far)     LONG TERM GOALS: Target date: 12/13/2023  Pt will complete daily HEP at least 1x/day (BID recommended) > 1 week  Baseline:  Goal status: PARTIALLY MET  2.  Pt will utilize dysarthria compensations in 15+ minute conversation to optimize vocal intensity and clarity given rare min A over 2 sessions Baseline:  11-21-23 Goal status: MET  3.  Pt will reduce throat clearing by 75% by LTG date Baseline: >5 at eval Goal status: PARTIALLY MET  4.  Pt will report improved communication effectiveness via PROM by 2 point improvement by last ST session   Baseline: CPIB=24 Goal status: MET  ASSESSMENT:  CLINICAL IMPRESSION: Patient is a 73 y.o. M who was seen today for communication changes related to progression of Parkinson's Disease. Continued targeting WNL vocal intensity and use of intentional voice during language tasks and unstructured conversation with pt benefiting from rare min A. Pt education provided on maintenance of intentional voice and consistent practice after therapy. Pt agreeable to discharge today.  OBJECTIVE IMPAIRMENTS: Objective impairments include dysarthria. These impairments are limiting patient from effectively communicating at home and in community.Factors affecting potential to achieve goals and functional outcome are  PD . Patient will benefit from skilled SLP services to address above impairments and improve overall function.  REHAB POTENTIAL: Excellent  PLAN:  SLP FREQUENCY: 2x/week  SLP DURATION: 8 weeks - anticipate 4 weeks   PLANNED INTERVENTIONS: Language facilitation, Environmental controls, Internal/external aids, Functional tasks, Multimodal communication approach, SLP instruction and feedback, Compensatory strategies, Patient/family education, [phone] Treatment of speech (30 or 45 min) , and [id number]- Speech Eval Sound Prod, Articulate, Phonological    Gracy Racer, CCC-SLP 11/23/2023, 10:43 AM

## 2023-11-28 ENCOUNTER — Ambulatory Visit: Admitting: Occupational Therapy

## 2023-11-28 ENCOUNTER — Encounter: Payer: Self-pay | Admitting: Occupational Therapy

## 2023-11-28 DIAGNOSIS — R293 Abnormal posture: Secondary | ICD-10-CM

## 2023-11-28 DIAGNOSIS — R29818 Other symptoms and signs involving the nervous system: Secondary | ICD-10-CM

## 2023-11-28 DIAGNOSIS — R471 Dysarthria and anarthria: Secondary | ICD-10-CM | POA: Diagnosis not present

## 2023-11-28 DIAGNOSIS — R278 Other lack of coordination: Secondary | ICD-10-CM | POA: Diagnosis not present

## 2023-11-28 DIAGNOSIS — M6281 Muscle weakness (generalized): Secondary | ICD-10-CM | POA: Diagnosis not present

## 2023-11-28 DIAGNOSIS — R251 Tremor, unspecified: Secondary | ICD-10-CM | POA: Diagnosis not present

## 2023-11-28 DIAGNOSIS — R2681 Unsteadiness on feet: Secondary | ICD-10-CM | POA: Diagnosis not present

## 2023-11-28 NOTE — Therapy (Signed)
 OUTPATIENT OCCUPATIONAL THERAPY [facility]  Patient Name: William [name] MRN: [id number] DOB:04-17-1951, 73 y.o., male Today's Date: 11/28/2023  PCP: Claudette Head, MD REFERRING PROVIDER: Vladimir Faster, DO  END OF SESSION:  OT End of Session - 11/28/23 0850     Visit Number 3    Number of Visits 17    Date for OT Re-Evaluation 01/17/24    Authorization Type [employer] MCR    Progress Note Due on Visit 10    OT Start Time 0848    OT Stop Time 0930    OT Time Calculation (min) 42 min    Equipment Utilized During Treatment FM objects    Activity Tolerance Patient tolerated treatment well    Behavior During Therapy WFL for tasks assessed/performed             Past Medical History:  Diagnosis Date   Allergic rhinitis    Anemia, iron deficiency    Arthritis    BPH associated with nocturia    ED (erectile dysfunction)    GERD (gastroesophageal reflux disease)    Hiatal hernia    History of COVID-19 11/2020   per pt moderate symptoms that resovled   History of hypertension    followed by pcp  (per pt resolved no medication since approx 2017)   History of kidney stones    Left ureteral calculus    Parkinson disease [facility])    neurologist--- dr tat;   mild jaw dyskinesia,   gait disorder, tremors   Peyronie's disease    PONV (postoperative nausea and vomiting)    S/P dilatation of esophageal stricture    Trigeminal neuropathy    left side   Ulcerative colitis    followed by dr Marina Goodell (GI)//   1992 s/p  total abdominal colectomy with ileoanal anastomosis   Wears glasses    Past Surgical History:  Procedure Laterality Date   CRANIECTOMY Left 09/16/2022   Procedure: RETROSIGMOID CRANI FOR MICROVASCULAR DECOMPRESSION;  Surgeon: Bethann Goo, DO;  Location: [facility] OR;  Service: Neurosurgery;  Laterality: Left;   CYSTOSCOPY W/ URETERAL STENT PLACEMENT  2012   for ureteral stricture   CYSTOSCOPY WITH RETROGRADE PYELOGRAM, URETEROSCOPY AND STENT PLACEMENT Left  12/31/2021   Procedure: CYSTOSCOPY WITH RETROGRADE PYELOGRAM, FLUOROSCOPIC INTERPRETATION, URETEROSCOPY  AND STENT PLACEMENT;  Surgeon: Marcine Matar, MD;  Location: [facility];  Service: Urology;  Laterality: Left;   CYSTOSCOPY/URETEROSCOPY/HOLMIUM LASER/STENT PLACEMENT  10/22/2005   @[facility] ;   w/ left ureteral balloon dilatation   HOLMIUM LASER APPLICATION Left 12/31/2021   Procedure: HOLMIUM LASER APPLICATION;  Surgeon: Marcine Matar, MD;  Location: [facility];  Service: Urology;  Laterality: Left;   INGUINAL HERNIA REPAIR     per pt unilateral  approxl 1962   KNEE ARTHROSCOPY Right 02/21/2002   @[facility]    LUMBAR LAMINECTOMY/DECOMPRESSION MICRODISCECTOMY N/A 06/28/2022   Procedure: L4-5 decompression with removal of synovial cyst;  Surgeon: Venita Lick, MD;  Location: [facility] OR;  Service: Orthopedics;  Laterality: N/A;  2.5 hrs 3 C-Bed   NASAL SINUS SURGERY     x2  in 2004   RECONSTRUCTION TENDON PULLEY HAND Right 05/03/2007   @[facility] ;  right long finger   TOTAL COLECTOMY  1992   W/  ILIEOANAL ANASTOMOSIS (for ilieoanal anatomatic stricture , partial bowel obstruction and required peroidic pneumatic dilatation's)   VARICOCELECTOMY     Patient Active Problem List   Diagnosis Date Noted   Pain in finger of right hand 07/13/2023  Acute bacterial bronchitis 12/23/2022   Trigeminal neuralgia 09/16/2022   Synovial cyst of lumbar facet joint 06/28/2022   Degeneration of lumbar intervertebral disc 01/06/2022   Low back pain 12/22/2021   Trigeminal neuralgia of left side of face 10/26/2021   Somatic dysfunction of right sacroiliac joint 02/18/2020   Cough 12/09/2019   Hypertensive disorder 07/23/2019   Lumbosacral spondylosis without myelopathy 05/01/2019   URI (upper respiratory infection) 01/20/2019   Pain in right knee 02/15/2018   Chest pain 05/28/2017   Hyperlipidemia 05/28/2017   Bradycardia 05/28/2017   Chest pain, atypical 05/27/2017    Lightheadedness 05/27/2017   Finger laceration 04/04/2017   Parkinson's disease (HCC) 02/26/2016   Eustachian tube dysfunction 02/06/2016   Hyponatremia 01/26/2016   Tremor 11/12/2015   Visual-spatial impairment 11/12/2015   Stress at work 10/24/2014   Nonspecific abnormal electrocardiogram (ECG) (EKG) 06/18/2014   S/P colectomy 06/18/2014   Well adult exam 06/18/2014   Renal stones 06/18/2014   Elevated BP 06/18/2014   Headache 04/25/2014   Essential hypertension, benign 04/25/2014   Serous otitis media 03/21/2014   Travel advice encounter 04/17/2013   Calcium nephrolithiasis 10/04/2012   Routine health maintenance 10/04/2012   Acute left flank pain 08/03/2011   Unspecified intestinal obstruction 12/03/2008   Other diseases of nasal cavity and sinuses(478.19) 05/30/2008   GERD 02/26/2008   PEPTIC STRICTURE 02/22/2008   HIATAL HERNIA 02/22/2008   Chest tightness or pressure 01/09/2008   ERECTILE DYSFUNCTION 04/07/2007   Allergic rhinitis 04/07/2007   COLITIS, ULCERATIVE NOS 04/07/2007   GASTRIC POLYP, HX OF 04/07/2007    ONSET DATE: 10/11/2023  REFERRING DIAG: R27.9 (ICD-10-CM) - Discoordination R47.9 (ICD-10-CM) - Difficulty with speech Parkinson's disease with dyskinesia and fluctuating manifestations (HCC)  THERAPY DIAG:  Other lack of coordination  Muscle weakness (generalized)  Other symptoms and signs involving the nervous system  Tremor  Unsteadiness on feet  Abnormal posture  Rationale for Evaluation and Treatment: Rehabilitation  SUBJECTIVE:   SUBJECTIVE STATEMENT: No pain/falls.  Exercises going ok  When asked about hobbies, pt reports he likes to cook.   Pt accompanied by: self  PERTINENT HISTORY: PD (diagnosed 2017), MCI, trigeminal neuralgia, LBP   PRECAUTIONS: None  WEIGHT BEARING RESTRICTIONS: No  PAIN:  Are you having pain? No  WEIGHT BEARING RESTRICTIONS: No   FALLS: Has patient fallen in last 6 months? No   LIVING  ENVIRONMENT: Lives with: lives with their spouse Lives in: House (2 story) Stairs: Yes: Internal: 13 steps; on right going up and has a regular flight of stairs to get to the gym, but otherwise does not need to do steps  and External: 3 steps; on right going up Has following equipment at home: None   PLOF: Independent, Vocation/Vocational requirements: Retired, Special educational needs teacher for about 30 years , and Leisure: reading   PATIENT GOALS: Wants to maintain and not feel like he's going to fall. Improve overall strength    OBJECTIVE:  Note: Objective measures were completed at Evaluation unless otherwise noted.  HAND DOMINANCE: Right  ADLs: Overall ADLs: slower, increased difficulty Transfers/ambulation related to ADLs: independent Eating: independent Grooming: independent UB Dressing: having difficulty with buttons on dress shirt and jacket LB Dressing: difficulty with socks Toileting: independent Bathing: independent Tub Shower transfers: independent Equipment: none  IADLs: Shopping: independent  Light housekeeping: pt does day to day cleaning (has cleaning company come in every other week)  Meal Prep: Pt has been doing cooking for past year (d/t wife's thumb and recent surgery)  Community mobility: independent Medication management: independent Landscape architect: independent Handwriting: Mild micrographia (and writes in print, all caps for 30 years)  MOBILITY STATUS: Independent  POSTURE COMMENTS:  rounded shoulders (slightly)    FUNCTIONAL OUTCOME MEASURES: Fastening/unfastening 3 buttons: 21.74 Physical performance test: PPT#2 (simulated eating) 12.04 &  PPT#4 (donning/doffing jacket): 16.58 sec  COORDINATION: 9 Hole Peg test: Right: 28 sec; Left: 28 sec Box and Blocks:  Right 40 blocks, Left 41 blocks Tremors: Resting and postural and more on Rt side  UE ROM:  WFL   SENSATION: WFL   COGNITION: Overall cognitive status:  word finding  difficulties  OBSERVATIONS: Bradykinesia and Postural tremors (mild)                                                                                                                    TREATMENT DATE: 11/28/23  Reviewed previously issued coordination HEP with cues to move big, open hands big.   Pt issued PWR! Hands (all 5) and PWR! Seated (basic 4) with cues for large amplitude movements and for Hand and Eye boosts. See pt instructions for details. *Noted pt goes into wrist flexion pattern for several above ex's and cued to keep wrists neutral to slight extension, and place hands back on knees b/t ex's for PWR! Up and PWR! Rock in seated.   UBE x 5 mintues, level 4 for UE strength/endurance, increasing HR w/ cues to keep RPM > 40  Recommended pt purchase small 3 ring binder w/ pockets and some protective sleeves to keep all exercises and education organized. Pt to bring in next session for therapist to assist with.    PATIENT EDUCATION: Education details: PWR! Hands, PWR! Seated Person educated: Patient Education method: Explanation, Demonstration, Verbal cues, and Handouts Education comprehension: verbalized understanding, returned demonstration, verbal cues required, and needs further education  HOME EXERCISE PROGRAM: 11/21/23: Coordination Activities 11/28/23: PWR! Hands, PWR! Seated  GOALS: Goals reviewed with patient? Yes   SHORT TERM GOALS: Target date: 12/18/23    Pt will be independent with PD specific HEP. Baseline: not yet initiated Goal status: IN Progress  2.  Pt will verbalize understanding of adapted strategies to maximize safety and independence with ADLs/IADLs. Baseline: not yet initiated Goal status: INITIAL  3.  Pt will write a paragraph with no significant decrease in size and maintain 100% legibility. Baseline: 100% legibility, mild micrographia Goal status: IN Progress  4.  Pt will demonstrate improved fine motor coordination for ADLs as evidenced by  decreasing 9 hole peg test score for both hands and by 2 secs Baseline: 28 secs both hands Goal status: IN Progress  5.  Pt will demonstrate improved ease with fastening buttons as evidenced by decreasing 3 button/unbutton time by 2 seconds Baseline: 21.74 seconds Goal status: IN Progress  6.  Pt will be able to place at least 45 blocks using Rt and Lt hand with completion of Box and Blocks test. Baseline: Rt = 40 blocks, Lt = 41 blocks Goal status: IN Progress  LONG TERM GOALS: Target date: 01/17/24    Pt will verbalize understanding of ways to prevent future PD related complications and PD community resources. Baseline: not yet initiated Goal status: INITIAL  2.  Pt will verbalize understanding of ways to keep thinking skills sharp and ways to compensate for STM changes in the future. Baseline: not yet initiated Goal status: INITIAL  3.  Pt will demonstrate increased ease with dressing as evidenced by decreasing PPT#4 (don/ doff jacket) to 14 secs or less. Baseline: 16.58 secs Goal status: INITIAL  4.  Pt will demonstrate improved fine motor coordination for ADLs as evidenced by decreasing 9 hole peg test score for both hands and by 4 secs Baseline: 28 secs (bilaterally) Goal status: INITIAL   5.  Pt to report greater ease with donning button up shirts and donning socks Baseline:  Goal status: INITIAL     ASSESSMENT:  CLINICAL IMPRESSION: Patient is a 73 y.o. male who was seen today for occupational therapy treatment for Parkinson's disease and bradykinesia. Pt was diagnosed with PD in 2017 but has never received O.T. Pt responded well to instruction in various FM activities for coordination and in hand manipulation today.  Pt will benefit from further O.T. to address deficits listed below, develop PD specific HEP, prevent future related PD complications, and issue community resources.   PERFORMANCE DEFICITS: in functional skills including ADLs, IADLs, coordination,  dexterity, ROM, strength, Fine motor control, Gross motor control, mobility, body mechanics, endurance, and UE functional use, cognitive skills including  word finding difficulties  IMPAIRMENTS: are limiting patient from ADLs, IADLs, rest and sleep, leisure, and social participation.   COMORBIDITIES:  may have co-morbidities  that affects occupational performance. Patient will benefit from skilled OT to address above impairments and improve overall function.  REHAB POTENTIAL: Good   PLAN:  OT FREQUENCY: 2x/week  OT DURATION: 8 weeks (PLUS EVAL)   PLANNED INTERVENTIONS: 97535 self care/ADL training, 16109 therapeutic exercise, 97530 therapeutic activity, 97112 neuromuscular re-education, 97140 manual therapy, 97113 aquatic therapy, 97010 moist heat, 97129 Cognitive training (first 15 min), 60454 Cognitive training(each additional 15 min), 09811 Orthotics management and training, 91478 Splinting (initial encounter), [id number] Subsequent splinting/medication, passive range of motion, functional mobility training, visual/perceptual remediation/compensation, patient/family education, and DME and/or AE instructions  RECOMMENDED OTHER SERVICES: none at this time  CONSULTED AND AGREED WITH PLAN OF CARE: Patient  PLAN FOR NEXT SESSION:  Review PWR! Hands and PWR! Seated prn, pt to bring in 3 ring binder to help organize all HEP's/education. ADL strategies, practice handwriting, jacket, buttons   Sheran Lawless, OT 11/28/2023, 8:50 AM

## 2023-11-28 NOTE — Patient Instructions (Signed)
PWR! Hand Exercises Perform each exercise at least 10 repetitions 1x/day, and PWR! PUSH throughout the day when you are having trouble using your hands (picking up small objects, writing, eating, typing, buttoning, etc.). ** Make each movement big and deliberate; feel the movement.  PWR! UP: Fists to open fingers BIG  PWR! Rock:  Move wrists up and down [employer]! Twist: Twist palms up and down BIG  PWR! Step: Step your thumb to index finger while keeping other fingers straight. Flick fingers out BIG (thumb out/straighten fingers). Repeat with other fingers.  PWR! PUSH: Push hands out BIG. Elbows straight, wrists up, fingers open and spread apart BIG.

## 2023-11-29 ENCOUNTER — Ambulatory Visit: Admitting: Internal Medicine

## 2023-11-29 ENCOUNTER — Encounter: Payer: Self-pay | Admitting: Internal Medicine

## 2023-11-29 VITALS — BP 112/70 | HR 73 | Ht 67.0 in | Wt 165.0 lb

## 2023-11-29 DIAGNOSIS — K219 Gastro-esophageal reflux disease without esophagitis: Secondary | ICD-10-CM

## 2023-11-29 DIAGNOSIS — D509 Iron deficiency anemia, unspecified: Secondary | ICD-10-CM | POA: Diagnosis not present

## 2023-11-29 DIAGNOSIS — K5901 Slow transit constipation: Secondary | ICD-10-CM

## 2023-11-29 DIAGNOSIS — K2100 Gastro-esophageal reflux disease with esophagitis, without bleeding: Secondary | ICD-10-CM

## 2023-11-29 DIAGNOSIS — K913 Postprocedural intestinal obstruction, unspecified as to partial versus complete: Secondary | ICD-10-CM

## 2023-11-29 DIAGNOSIS — K5909 Other constipation: Secondary | ICD-10-CM | POA: Diagnosis not present

## 2023-11-29 DIAGNOSIS — K3189 Other diseases of stomach and duodenum: Secondary | ICD-10-CM

## 2023-11-29 MED ORDER — OMEPRAZOLE 40 MG PO CPDR: 40.0000 mg | DELAYED_RELEASE_CAPSULE | Freq: Two times a day (BID) | ORAL | 3 refills | Status: AC

## 2023-11-29 NOTE — Patient Instructions (Signed)
We have sent the following medications to your pharmacy for you to pick up at your convenience:  Omeprazole.  Please follow up in one year.  _______________________________________________________  If your blood pressure at your visit was 140/90 or greater, please contact your primary care physician to follow up on this.  _______________________________________________________  If you are age 73 or older, your body mass index should be between 23-30. Your Body mass index is 25.84 kg/m. If this is out of the aforementioned range listed, please consider follow up with your Primary Care Provider.  If you are age 48 or younger, your body mass index should be between 19-25. Your Body mass index is 25.84 kg/m. If this is out of the aformentioned range listed, please consider follow up with your Primary Care Provider.   ________________________________________________________  The [facility] providers would like to encourage you to use [facility] to communicate with providers for non-urgent requests or questions.  Due to long hold times on the telephone, sending your provider a message by [facility] may be a faster and more efficient way to get a response.  Please allow 48 business hours for a response.  Please remember that this is for non-urgent requests.  _______________________________________________________

## 2023-11-29 NOTE — Progress Notes (Unsigned)
 Assessment/Plan:   1.  Parkinsons Disease  -continue carbidopa/levodopa 25/100, 2 tablets at 7 AM, 2 tablets at 11 AM, 2 tablets at 4 PM.  -Continue ropinirole, 3 mg 3 times per day.  He wants to consider changing back to neupro and we discussed that today but decided to hold until next visit.   -continue amantadine 100 mg tid  -We discussed Vyalev, which is foscarbidopa/foslevodopa pump that is newly FDA approved.  We discussed that it is for motor fluctuations in adults with advanced Parkinson's disease.  We discussed that this likely will not be on Medicare formulary until the latter half of 2025.  We discussed risks and benefits of this drug.   He isn't interested right now.  -he's helping to caregive for wife right now (she just had surgery) and he's having a bit of caregiver fatigue.  Supported him in this.  2.  MCI  -Last neurocognitive testing in 2019.  I do not think anything clinically has significantly changed since that time.  3.  Trigeminal neuralgia  -Patient underwent left microvascular decompression of the trigeminal nerve on September 16, 2022 with Dr. Jake Samples  -Patient with hyponatremia with carbamazepine  4.  LBP  -Follows with [facility] Subjective:   William [name] was seen today in follow up for Parkinsons disease.  My previous records were reviewed prior to todays visit as well as outside records available to me.  Last visit, the patient wanted to consider changing back to rotigotine patch, but I wanted him to try and wait until this visit and work on increasing exercise.  We also added amantadine.  He reports today that it has helped dyskinesia.  He reports that medication is as "good as he has been."    He has been to PT/OT/ST since last visit.  Notes reviewed.  Current prescribed movement disorder medications: Carbidopa/levodopa 25/100,2 tablets at 7 AM, 2 tablets at 11 AM, 2 tablets at 4 PM.   Ropinirole, 3 mg 3 times per day Amantadine, 100 mg 3 times per day  (added last visit)  Prior meds:  pramipexole er (switched due to a near syncopal episode after drink of alcohol); Lyrica (tolerated fine but when off when had neuralgia surgery); carbamazepine (hyponatremia); rotigotine (helped, but caused rash and was expensive)   ALLERGIES:  No Known Allergies  CURRENT MEDICATIONS:  Outpatient Encounter Medications as of 12/01/2023  Medication Sig   amantadine (SYMMETREL) 100 MG capsule Take 1 capsule (100 mg total) by mouth 3 (three) times daily.   carbidopa-levodopa (SINEMET IR) 25-100 MG tablet TAKE 2 TABLETS BY MOUTH AT 7AM/2 TABS AT 11AM/ 2 TABS AT 4PM   cefdinir (OMNICEF) 300 MG capsule Take 1 capsule (300 mg total) by mouth 2 (two) times daily.   ferrous sulfate 325 (65 FE) MG tablet Take 65 mg by mouth in the morning and at bedtime.   omeprazole (PRILOSEC) 40 MG capsule Take 1 capsule (40 mg total) by mouth 2 (two) times daily.   psyllium (METAMUCIL) 58.6 % powder Take 1 packet by mouth 2 (two) times daily.   rOPINIRole (REQUIP) 3 MG tablet Take 1 tablet (3 mg total) by mouth 3 (three) times daily.   [DISCONTINUED] betamethasone dipropionate 0.05 % cream Apply topically 2 (two) times daily as needed. (Patient not taking: Reported on 11/29/2023)   [DISCONTINUED] budesonide-formoterol (SYMBICORT) 160-4.5 MCG/ACT inhaler Inhale 2 puffs into the lungs 2 (two) times daily. (Patient not taking: Reported on 11/29/2023)   [DISCONTINUED] omeprazole (PRILOSEC) 40 MG  capsule TAKE 1 CAPSULE BY MOUTH TWICE A DAY   No facility-administered encounter medications on file as of 12/01/2023.    Objective:   PHYSICAL EXAMINATION:    VITALS:   Vitals:   12/01/23 0843  BP: 116/60  Pulse: 60  SpO2: 96%  Weight: 167 lb (75.8 kg)    GEN:  The patient appears stated age and is in NAD. HEENT:  Normocephalic, AT.   CV:  RRR  Lungs :  ctab  Neurological examination:  Orientation: The patient is alert and oriented x3. Cranial nerves: There is good facial  symmetry with mild facial hypomimia. The speech is fluent and clear. Soft palate rises symmetrically and there is no tongue deviation. Hearing is intact to conversational tone. Sensation: Sensation is intact to light touch throughout Motor: Strength is at least antigravity x4.  Movement examination: Tone: There is nl tone in the UE/LE Abnormal movements: mild dyskinesia in the R leg Coordination:  There is no decremation, with any form of RAMS, including alternating supination and pronation of the forearm, hand opening and closing, finger taps, heel taps and toe taps.  Gait and Station: The patient ambulates well in the hall with good arm swing and no significant dyskinesia.    I have reviewed and interpreted the following labs independently    Chemistry      Component Value Date/Time   NA 143 09/16/2022 1148   K 4.0 09/16/2022 1148   CL 108 09/02/2022 0935   CO2 24 09/02/2022 0935   BUN 27 (H) 09/02/2022 0935   CREATININE 1.25 (H) 09/02/2022 0935   CREATININE 1.07 06/27/2018 0915      Component Value Date/Time   CALCIUM 9.2 09/02/2022 0935   ALKPHOS 39 09/02/2022 0935   AST 22 09/02/2022 0935   ALT 15 09/02/2022 0935   BILITOT 0.9 09/02/2022 0935       Lab Results  Component Value Date   WBC 3.7 (L) 09/02/2022   HGB 10.9 (L) 09/16/2022   HCT 32.0 (L) 09/16/2022   MCV 96.6 09/02/2022   PLT 156 09/02/2022    Lab Results  Component Value Date   TSH 2.40 11/28/2018    Total time spent on today's visit was 30 minutes, including both face-to-face time and nonface-to-face time.  Time included that spent on review of records (prior notes available to me/labs/imaging if pertinent), discussing treatment and goals, answering patient's questions and coordinating care.    Cc:  Plotnikov, Georgina Quint, MD

## 2023-11-29 NOTE — Progress Notes (Signed)
 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 The patient presents today for routine follow-up and management of his chronic GI conditions. He also has a history of Parkinson's disease. The patient was last seen in the office April 26, 2022 by the GI nurse practitioner. See that dictation for details. For his erosive esophagitis he is maintained on omeprazole 40 mg twice daily. For his bowels he continues on Metamucil daily. He has been on iron supplement. He tells me that he is doing well. No active reflux symptoms. No dysphagia. No significant problems with constipation or anything to suggest recurrent bowel obstruction. His last anastomotic dilation was March 2016. He sees Dr. Arbutus Leas for his Parkinson's disease.  His last hemoglobin October 2023 was normal at 14.3  REVIEW OF SYSTEMS:  All non-GI ROS negative except for tremor, arthritis  Past Medical History:  Diagnosis Date   Allergic rhinitis    Anemia, iron deficiency    Arthritis    BPH associated with nocturia    ED (erectile dysfunction)    GERD (gastroesophageal reflux disease)    Hiatal hernia    History of COVID-19 11/2020   per pt moderate symptoms that resovled   History of hypertension    followed by pcp  (per pt resolved no medication since approx 2017)   History of kidney stones    Left ureteral calculus    Parkinson disease [facility])    neurologist--- dr tat;   mild jaw dyskinesia,   gait disorder, tremors   Peyronie's disease    PONV (postoperative nausea and vomiting)    S/P dilatation of esophageal stricture    Trigeminal neuropathy    left side   Ulcerative colitis    followed by dr Marina Goodell  (GI)//   1992 s/p  total abdominal colectomy with ileoanal anastomosis   Wears glasses     Past Surgical History:  Procedure Laterality Date   CRANIECTOMY Left 09/16/2022   Procedure: RETROSIGMOID CRANI FOR MICROVASCULAR DECOMPRESSION;  Surgeon: Bethann Goo, DO;  Location: [facility] OR;  Service: Neurosurgery;  Laterality: Left;   CYSTOSCOPY W/ URETERAL STENT PLACEMENT  2012   for ureteral stricture   CYSTOSCOPY WITH RETROGRADE PYELOGRAM, URETEROSCOPY AND STENT PLACEMENT Left 12/31/2021   Procedure: CYSTOSCOPY WITH RETROGRADE PYELOGRAM, FLUOROSCOPIC INTERPRETATION, URETEROSCOPY  AND STENT PLACEMENT;  Surgeon: Marcine Matar, MD;  Location: [facility];  Service: Urology;  Laterality: Left;   CYSTOSCOPY/URETEROSCOPY/HOLMIUM LASER/STENT PLACEMENT  10/22/2005   @[facility] ;   w/ left ureteral balloon dilatation   HOLMIUM LASER APPLICATION Left 12/31/2021   Procedure: HOLMIUM LASER APPLICATION;  Surgeon: Marcine Matar, MD;  Location: [facility];  Service: Urology;  Laterality: Left;   INGUINAL HERNIA REPAIR     per pt unilateral  approxl 1962   KNEE ARTHROSCOPY Right 02/21/2002   @[facility]    LUMBAR LAMINECTOMY/DECOMPRESSION MICRODISCECTOMY N/A 06/28/2022   Procedure: L4-5 decompression with removal of synovial cyst;  Surgeon: Venita Lick, MD;  Location: [facility] OR;  Service: Orthopedics;  Laterality: N/A;  2.5 hrs 3 C-Bed   NASAL SINUS SURGERY     x2  in 2004   RECONSTRUCTION TENDON PULLEY HAND Right 05/03/2007   @[facility] ;  right long finger   TOTAL COLECTOMY  1992  W/  ILIEOANAL ANASTOMOSIS (for ilieoanal anatomatic stricture , partial bowel obstruction and required peroidic pneumatic dilatation's)   VARICOCELECTOMY      Social History CORTLAN DOLIN  reports that he quit smoking about 44 years ago. His smoking use included cigarettes. He started smoking about 54 years ago. He has never used smokeless tobacco. He reports current alcohol use of about 14.0 standard  drinks of alcohol per week. He reports that he does not use drugs.  family history includes Cancer in his father; Dementia in his mother; Heart disease in his brother; Hypertension in his mother; Rectal cancer in his father.  No Known Allergies     PHYSICAL EXAMINATION: Vital signs: BP 112/70   Pulse 73   Ht 5\' 7"  (1.702 m)   Wt 165 lb (74.8 kg)   BMI 25.84 kg/m  General: Well-developed, well-nourished, no acute distress HEENT: Sclerae are anicteric, conjunctiva pink. Oral mucosa intact Lungs: Clear Heart: Regular Abdomen: soft, nontender, nondistended, no obvious ascites, no peritoneal signs, normal bowel sounds. No organomegaly. Extremities: No edema Psychiatric: alert and oriented x3. Cooperative     ASSESSMENT:   1.  GERD with erosive esophagitis.  Peptic stricture.  Asymptomatic on PPI 2.  Iron deficiency anemia secondary to the same.  On iron replacement 3.  Remote history of ulcerative colitis status post total abdominal colectomy with ileoanal anastomosis 4.  History of anastomotic stricture requiring pneumatic dilation 5.  Chronic constipation.  Improved with fiber 6.  General medical problems     PLAN:   1.  Continue omeprazole 40 mg twice daily.  Prescription refilled.  Medication risks reviewed 2.  Continue daily Metamucil and water 3.  Continue iron supplementation 4.  Ongoing general medical care with Dr. Posey Rea 5.  Ongoing neurologic care with Dr. Arbutus Leas 6.  Routine GI office follow-up 2 years.  Sooner if needed A total time of 30 minutes was spent preparing to see the patient.  Reviewing test, obtaining history, performing medically appropriate physical examination, counseling the patient regarding his GI issues, ordering medications and follow-up, and documenting clinical information in the health record

## 2023-11-30 ENCOUNTER — Ambulatory Visit: Admitting: Occupational Therapy

## 2023-12-01 ENCOUNTER — Ambulatory Visit: Payer: Medicare HMO | Admitting: Neurology

## 2023-12-01 ENCOUNTER — Encounter: Payer: Self-pay | Admitting: Neurology

## 2023-12-01 VITALS — BP 116/60 | HR 60 | Wt 167.0 lb

## 2023-12-01 DIAGNOSIS — G20B2 Parkinson's disease with dyskinesia, with fluctuations: Secondary | ICD-10-CM | POA: Diagnosis not present

## 2023-12-04 ENCOUNTER — Ambulatory Visit (HOSPITAL_COMMUNITY)
Admission: EM | Admit: 2023-12-04 | Discharge: 2023-12-04 | Disposition: A | Discharge status: Home / Self Care | Attending: Emergency Medicine | Admitting: Emergency Medicine

## 2023-12-04 ENCOUNTER — Encounter (HOSPITAL_COMMUNITY): Payer: Self-pay

## 2023-12-04 DIAGNOSIS — U071 COVID-19: Secondary | ICD-10-CM | POA: Diagnosis not present

## 2023-12-04 LAB — POC COVID19/FLU A&B COMBO
Covid Antigen, POC: POSITIVE — AB
Influenza A Antigen, POC: NEGATIVE
Influenza B Antigen, POC: NEGATIVE

## 2023-12-04 MED ORDER — BENZONATATE 100 MG PO CAPS: 100.0000 mg | ORAL_CAPSULE | Freq: Three times a day (TID) | ORAL | 0 refills | Status: DC | PRN

## 2023-12-04 MED ORDER — PAXLOVID (300/100) 20 X 150 MG & 10 X 100MG PO TBPK
3.0000 | ORAL_TABLET | Freq: Two times a day (BID) | ORAL | 0 refills | Status: AC
Start: 2023-12-04 — End: 2023-12-09

## 2023-12-04 NOTE — ED Provider Notes (Signed)
 [facility]    CSN: [id number] Arrival date & time: 12/04/23  1003      History   Chief Complaint Chief Complaint  Patient presents with   Cough   Nasal Congestion   Headache    HPI William [name] is a 73 y.o. male.  Last night developed nasal congestion, cough, headache and body aches. No known fever. Denies sore throat, abd pain, NVD Not short of breath or wheezing Has not attempted intervention yet Possible sick contacts  History of parkinson  Past Medical History:  Diagnosis Date   Allergic rhinitis    Anemia, iron deficiency    Arthritis    BPH associated with nocturia    ED (erectile dysfunction)    GERD (gastroesophageal reflux disease)    Hiatal hernia    History of COVID-19 11/2020   per pt moderate symptoms that resovled   History of hypertension    followed by pcp  (per pt resolved no medication since approx 2017)   History of kidney stones    Left ureteral calculus    Parkinson disease [facility])    neurologist--- dr tat;   mild jaw dyskinesia,   gait disorder, tremors   Peyronie's disease    PONV (postoperative nausea and vomiting)    S/P dilatation of esophageal stricture    Trigeminal neuropathy    left side   Ulcerative colitis    followed by dr Marina Goodell (GI)//   1992 s/p  total abdominal colectomy with ileoanal anastomosis   Wears glasses     Patient Active Problem List   Diagnosis Date Noted   Pain in finger of right hand 07/13/2023   Acute bacterial bronchitis 12/23/2022   Trigeminal neuralgia 09/16/2022   Synovial cyst of lumbar facet joint 06/28/2022   Degeneration of lumbar intervertebral disc 01/06/2022   Low back pain 12/22/2021   Trigeminal neuralgia of left side of face 10/26/2021   Somatic dysfunction of right sacroiliac joint 02/18/2020   Cough 12/09/2019   Hypertensive disorder 07/23/2019   Lumbosacral spondylosis without myelopathy 05/01/2019   URI (upper respiratory infection) 01/20/2019   Pain in right knee  02/15/2018   Chest pain 05/28/2017   Hyperlipidemia 05/28/2017   Bradycardia 05/28/2017   Chest pain, atypical 05/27/2017   Lightheadedness 05/27/2017   Finger laceration 04/04/2017   Parkinson's disease (HCC) 02/26/2016   Eustachian tube dysfunction 02/06/2016   Hyponatremia 01/26/2016   Tremor 11/12/2015   Visual-spatial impairment 11/12/2015   Stress at work 10/24/2014   Nonspecific abnormal electrocardiogram (ECG) (EKG) 06/18/2014   S/P colectomy 06/18/2014   Well adult exam 06/18/2014   Renal stones 06/18/2014   Elevated BP 06/18/2014   Headache 04/25/2014   Essential hypertension, benign 04/25/2014   Serous otitis media 03/21/2014   Travel advice encounter 04/17/2013   Calcium nephrolithiasis 10/04/2012   Routine health maintenance 10/04/2012   Acute left flank pain 08/03/2011   Unspecified intestinal obstruction 12/03/2008   Other diseases of nasal cavity and sinuses(478.19) 05/30/2008   GERD 02/26/2008   PEPTIC STRICTURE 02/22/2008   HIATAL HERNIA 02/22/2008   Chest tightness or pressure 01/09/2008   ERECTILE DYSFUNCTION 04/07/2007   Allergic rhinitis 04/07/2007   COLITIS, ULCERATIVE NOS 04/07/2007   GASTRIC POLYP, HX OF 04/07/2007    Past Surgical History:  Procedure Laterality Date   CRANIECTOMY Left 09/16/2022   Procedure: RETROSIGMOID CRANI FOR MICROVASCULAR DECOMPRESSION;  Surgeon: Bethann Goo, DO;  Location: [facility];  Service: Neurosurgery;  Laterality: Left;   CYSTOSCOPY W/  URETERAL STENT PLACEMENT  2012   for ureteral stricture   CYSTOSCOPY WITH RETROGRADE PYELOGRAM, URETEROSCOPY AND STENT PLACEMENT Left 12/31/2021   Procedure: CYSTOSCOPY WITH RETROGRADE PYELOGRAM, FLUOROSCOPIC INTERPRETATION, URETEROSCOPY  AND STENT PLACEMENT;  Surgeon: Marcine Matar, MD;  Location: [facility];  Service: Urology;  Laterality: Left;   CYSTOSCOPY/URETEROSCOPY/HOLMIUM LASER/STENT PLACEMENT  10/22/2005   @[facility] ;   w/ left ureteral balloon dilatation    HOLMIUM LASER APPLICATION Left 12/31/2021   Procedure: HOLMIUM LASER APPLICATION;  Surgeon: Marcine Matar, MD;  Location: [facility];  Service: Urology;  Laterality: Left;   INGUINAL HERNIA REPAIR     per pt unilateral  approxl 1962   KNEE ARTHROSCOPY Right 02/21/2002   @[facility]    LUMBAR LAMINECTOMY/DECOMPRESSION MICRODISCECTOMY N/A 06/28/2022   Procedure: L4-5 decompression with removal of synovial cyst;  Surgeon: Venita Lick, MD;  Location: [facility] OR;  Service: Orthopedics;  Laterality: N/A;  2.5 hrs 3 C-Bed   NASAL SINUS SURGERY     x2  in 2004   RECONSTRUCTION TENDON PULLEY HAND Right 05/03/2007   @[facility] ;  right long finger   TOTAL COLECTOMY  1992   W/  ILIEOANAL ANASTOMOSIS (for ilieoanal anatomatic stricture , partial bowel obstruction and required peroidic pneumatic dilatation's)   VARICOCELECTOMY         Home Medications    Prior to Admission medications   Medication Sig Start Date End Date Taking? Authorizing Provider  amantadine (SYMMETREL) 100 MG capsule Take 1 capsule (100 mg total) by mouth 3 (three) times daily. 10/11/23  Yes Tat, Octaviano Batty, DO  benzonatate (TESSALON) 100 MG capsule Take 1 capsule (100 mg total) by mouth 3 (three) times daily as needed for cough. 12/04/23  Yes Stefan Markarian, Lurena Joiner, PA-C  carbidopa-levodopa (SINEMET IR) 25-100 MG tablet TAKE 2 TABLETS BY MOUTH AT 7AM/2 TABS AT 11AM/ 2 TABS AT 4PM 09/23/23  Yes Tat, Octaviano Batty, DO  ferrous sulfate 325 (65 FE) MG tablet Take 65 mg by mouth in the morning and at bedtime.   Yes [provider]  nirmatrelvir/ritonavir (PAXLOVID, 300/100,) 20 x 150 MG & 10 x 100MG  TBPK Take 3 tablets by mouth 2 (two) times daily for 5 days. Patient GFR is 87. Take nirmatrelvir (150 mg) two tablets twice daily for 5 days and ritonavir (100 mg) one tablet twice daily for 5 days. 12/04/23 12/09/23 Yes Dody Smartt, Lurena Joiner, PA-C  omeprazole (PRILOSEC) 40 MG capsule Take 1 capsule (40 mg total) by mouth 2 (two) times daily.  11/29/23  Yes Hilarie Fredrickson, MD  rOPINIRole (REQUIP) 3 MG tablet Take 1 tablet (3 mg total) by mouth 3 (three) times daily. 05/20/23  Yes Tat, Octaviano Batty, DO    Family History Family History  Problem Relation Age of Onset   Cancer Father        rectal cancer and MM   Rectal cancer Father    Dementia Mother    Hypertension Mother    Heart disease Brother        valve repaired at 33   Colon cancer Neg Hx    Esophageal cancer Neg Hx    Stomach cancer Neg Hx     Social History Social History   Tobacco Use   Smoking status: Former    Current packs/day: 0.00    Types: Cigarettes    Start date: 1971    Quit date: 1981    Years since quitting: 44.2   Smokeless tobacco: Never  Vaping Use   Vaping status: Never  Used  Substance Use Topics   Alcohol use: Yes    Alcohol/week: 14.0 standard drinks of alcohol    Types: 14 Glasses of wine per week    Comment: 2 glasses of wine per night   Drug use: No     Allergies   Patient has no known allergies.   Review of Systems Review of Systems Per HPI  Physical Exam Triage Vital Signs ED Triage Vitals [12/04/23 1021]  Encounter Vitals Group     BP 117/64     Systolic BP Percentile      Diastolic BP Percentile      Pulse Rate 82     Resp 18     Temp 98.5 F (36.9 C)     Temp Source Oral     SpO2 95 %     Weight      Height      Head Circumference      Peak Flow      Pain Score      Pain Loc      Pain Education      Exclude from Growth Chart    No data found.  Updated Vital Signs BP 117/64 (BP Location: Right Arm)   Pulse 82   Temp 98.5 F (36.9 C) (Oral)   Resp 18   SpO2 95%   Visual Acuity Right Eye Distance:   Left Eye Distance:   Bilateral Distance:    Right Eye Near:   Left Eye Near:    Bilateral Near:     Physical Exam Vitals and nursing note reviewed.  Constitutional:      Appearance: He is not ill-appearing.  HENT:     Right Ear: Tympanic membrane and ear canal normal.     Left Ear: Tympanic  membrane and ear canal normal.     Nose: No rhinorrhea.     Mouth/Throat:     Mouth: Mucous membranes are moist.     Pharynx: Oropharynx is clear. No posterior oropharyngeal erythema.  Eyes:     Conjunctiva/sclera: Conjunctivae normal.  Cardiovascular:     Rate and Rhythm: Normal rate and regular rhythm.     Pulses: Normal pulses.     Heart sounds: Normal heart sounds.  Pulmonary:     Effort: Pulmonary effort is normal. No respiratory distress.     Breath sounds: Normal breath sounds. No wheezing, rhonchi or rales.  Musculoskeletal:     Cervical back: Normal range of motion.  Lymphadenopathy:     Cervical: No cervical adenopathy.  Skin:    General: Skin is warm and dry.  Neurological:     Mental Status: He is alert and oriented to person, place, and time.     Motor: Tremor (baseline) present.     UC Treatments / Results  Labs (all labs ordered are listed, but only abnormal results are displayed) Labs Reviewed  POC COVID19/FLU A&B COMBO - Abnormal; Notable for the following components:      Result Value   Covid Antigen, POC Positive (*)    All other components within normal limits    EKG   Radiology No results found.  Procedures Procedures (including critical care time)  Medications Ordered in UC Medications - No data to display  Initial Impression / Assessment and Plan / UC Course  I have reviewed the triage vital signs and the nursing notes.  Pertinent labs & imaging results that were available during my care of the patient were reviewed by me and considered  in my medical decision making (see chart for details).  Afebrile, well appearing Rapid covid is positive  Discussed paxlovid. Patient would like to try it. Normal renal function, sent regular dosing. Advised other symptomatic care in the meantime, viral prognosis, reasons to return to clinic. Patient agrees to plan, all questions answered  Final Clinical Impressions(s) / UC Diagnoses   Final diagnoses:   COVID     Discharge Instructions      Your covid test is positive  Paxlovid -- take as directed. This is the antiviral for covid. It will not treat your actual symptoms  Tessalon -- three times daily for cough  Guaifenesin (Mucinex) -- available over the counter. Take with lots of fluids for congestion and cough     ED Prescriptions     Medication Sig Dispense Auth. Provider   benzonatate (TESSALON) 100 MG capsule Take 1 capsule (100 mg total) by mouth 3 (three) times daily as needed for cough. 30 capsule Kharee Lesesne, PA-C   nirmatrelvir/ritonavir (PAXLOVID, 300/100,) 20 x 150 MG & 10 x 100MG  TBPK Take 3 tablets by mouth 2 (two) times daily for 5 days. Patient GFR is 87. Take nirmatrelvir (150 mg) two tablets twice daily for 5 days and ritonavir (100 mg) one tablet twice daily for 5 days. 30 tablet [place] Ravenscroft, Lurena Joiner, PA-C      PDMP not reviewed this encounter.   Marlow Baars, [place] 12/04/23 1058

## 2023-12-04 NOTE — ED Triage Notes (Signed)
 Patient presents to office for  cough,nasal congestion that started last night.  Home Intervention: None

## 2023-12-04 NOTE — Discharge Instructions (Addendum)
 Your covid test is positive  Paxlovid -- take as directed. This is the antiviral for covid. It will not treat your actual symptoms  Tessalon -- three times daily for cough  Guaifenesin (Mucinex) -- available over the counter. Take with lots of fluids for congestion and cough

## 2023-12-05 ENCOUNTER — Ambulatory Visit: Admitting: Occupational Therapy

## 2023-12-07 ENCOUNTER — Ambulatory Visit: Admitting: Occupational Therapy

## 2023-12-12 ENCOUNTER — Ambulatory Visit: Admitting: Occupational Therapy

## 2023-12-12 DIAGNOSIS — R29818 Other symptoms and signs involving the nervous system: Secondary | ICD-10-CM | POA: Diagnosis not present

## 2023-12-12 DIAGNOSIS — R251 Tremor, unspecified: Secondary | ICD-10-CM | POA: Diagnosis not present

## 2023-12-12 DIAGNOSIS — R278 Other lack of coordination: Secondary | ICD-10-CM

## 2023-12-12 DIAGNOSIS — M6281 Muscle weakness (generalized): Secondary | ICD-10-CM

## 2023-12-12 DIAGNOSIS — R2681 Unsteadiness on feet: Secondary | ICD-10-CM | POA: Diagnosis not present

## 2023-12-12 DIAGNOSIS — R471 Dysarthria and anarthria: Secondary | ICD-10-CM | POA: Diagnosis not present

## 2023-12-12 DIAGNOSIS — R293 Abnormal posture: Secondary | ICD-10-CM | POA: Diagnosis not present

## 2023-12-12 NOTE — Patient Instructions (Signed)

## 2023-12-12 NOTE — Therapy (Signed)
 OUTPATIENT OCCUPATIONAL THERAPY [facility]  Patient Name: William [name] MRN: [id number] DOB:August 28, 1951, 73 y.o., male Today's Date: 12/12/2023  PCP: Claudette Head, MD REFERRING PROVIDER: Vladimir Faster, DO  END OF SESSION:  OT End of Session - 12/12/23 0845     Visit Number 4    Number of Visits 17    Date for OT Re-Evaluation 01/17/24    Authorization Type [employer] MCR    Progress Note Due on Visit 10    OT Start Time 0845    OT Stop Time 0930    OT Time Calculation (min) 45 min    Equipment Utilized During Treatment Button up shirt    Activity Tolerance Patient tolerated treatment well    Behavior During Therapy WFL for tasks assessed/performed             Past Medical History:  Diagnosis Date   Allergic rhinitis    Anemia, iron deficiency    Arthritis    BPH associated with nocturia    ED (erectile dysfunction)    GERD (gastroesophageal reflux disease)    Hiatal hernia    History of COVID-19 11/2020   per pt moderate symptoms that resovled   History of hypertension    followed by pcp  (per pt resolved no medication since approx 2017)   History of kidney stones    Left ureteral calculus    Parkinson disease [facility])    neurologist--- dr tat;   mild jaw dyskinesia,   gait disorder, tremors   Peyronie's disease    PONV (postoperative nausea and vomiting)    S/P dilatation of esophageal stricture    Trigeminal neuropathy    left side   Ulcerative colitis    followed by dr Marina Goodell (GI)//   1992 s/p  total abdominal colectomy with ileoanal anastomosis   Wears glasses    Past Surgical History:  Procedure Laterality Date   CRANIECTOMY Left 09/16/2022   Procedure: RETROSIGMOID CRANI FOR MICROVASCULAR DECOMPRESSION;  Surgeon: Bethann Goo, DO;  Location: [facility] OR;  Service: Neurosurgery;  Laterality: Left;   CYSTOSCOPY W/ URETERAL STENT PLACEMENT  2012   for ureteral stricture   CYSTOSCOPY WITH RETROGRADE PYELOGRAM, URETEROSCOPY AND STENT PLACEMENT Left  12/31/2021   Procedure: CYSTOSCOPY WITH RETROGRADE PYELOGRAM, FLUOROSCOPIC INTERPRETATION, URETEROSCOPY  AND STENT PLACEMENT;  Surgeon: Marcine Matar, MD;  Location: [facility];  Service: Urology;  Laterality: Left;   CYSTOSCOPY/URETEROSCOPY/HOLMIUM LASER/STENT PLACEMENT  10/22/2005   @[facility] ;   w/ left ureteral balloon dilatation   HOLMIUM LASER APPLICATION Left 12/31/2021   Procedure: HOLMIUM LASER APPLICATION;  Surgeon: Marcine Matar, MD;  Location: [facility];  Service: Urology;  Laterality: Left;   INGUINAL HERNIA REPAIR     per pt unilateral  approxl 1962   KNEE ARTHROSCOPY Right 02/21/2002   @[facility]    LUMBAR LAMINECTOMY/DECOMPRESSION MICRODISCECTOMY N/A 06/28/2022   Procedure: L4-5 decompression with removal of synovial cyst;  Surgeon: Venita Lick, MD;  Location: [facility] OR;  Service: Orthopedics;  Laterality: N/A;  2.5 hrs 3 C-Bed   NASAL SINUS SURGERY     x2  in 2004   RECONSTRUCTION TENDON PULLEY HAND Right 05/03/2007   @[facility] ;  right long finger   TOTAL COLECTOMY  1992   W/  ILIEOANAL ANASTOMOSIS (for ilieoanal anatomatic stricture , partial bowel obstruction and required peroidic pneumatic dilatation's)   VARICOCELECTOMY     Patient Active Problem List   Diagnosis Date Noted   Pain in finger of right hand 07/13/2023  Acute bacterial bronchitis 12/23/2022   Trigeminal neuralgia 09/16/2022   Synovial cyst of lumbar facet joint 06/28/2022   Degeneration of lumbar intervertebral disc 01/06/2022   Low back pain 12/22/2021   Trigeminal neuralgia of left side of face 10/26/2021   Somatic dysfunction of right sacroiliac joint 02/18/2020   Cough 12/09/2019   Hypertensive disorder 07/23/2019   Lumbosacral spondylosis without myelopathy 05/01/2019   URI (upper respiratory infection) 01/20/2019   Pain in right knee 02/15/2018   Chest pain 05/28/2017   Hyperlipidemia 05/28/2017   Bradycardia 05/28/2017   Chest pain, atypical 05/27/2017    Lightheadedness 05/27/2017   Finger laceration 04/04/2017   Parkinson's disease (HCC) 02/26/2016   Eustachian tube dysfunction 02/06/2016   Hyponatremia 01/26/2016   Tremor 11/12/2015   Visual-spatial impairment 11/12/2015   Stress at work 10/24/2014   Nonspecific abnormal electrocardiogram (ECG) (EKG) 06/18/2014   S/P colectomy 06/18/2014   Well adult exam 06/18/2014   Renal stones 06/18/2014   Elevated BP 06/18/2014   Headache 04/25/2014   Essential hypertension, benign 04/25/2014   Serous otitis media 03/21/2014   Travel advice encounter 04/17/2013   Calcium nephrolithiasis 10/04/2012   Routine health maintenance 10/04/2012   Acute left flank pain 08/03/2011   Unspecified intestinal obstruction 12/03/2008   Other diseases of nasal cavity and sinuses(478.19) 05/30/2008   GERD 02/26/2008   PEPTIC STRICTURE 02/22/2008   HIATAL HERNIA 02/22/2008   Chest tightness or pressure 01/09/2008   ERECTILE DYSFUNCTION 04/07/2007   Allergic rhinitis 04/07/2007   COLITIS, ULCERATIVE NOS 04/07/2007   GASTRIC POLYP, HX OF 04/07/2007    ONSET DATE: 10/11/2023  REFERRING DIAG: R27.9 (ICD-10-CM) - Discoordination R47.9 (ICD-10-CM) - Difficulty with speech Parkinson's disease with dyskinesia and fluctuating manifestations (HCC)  THERAPY DIAG:  Other lack of coordination  Muscle weakness (generalized)  Other symptoms and signs involving the nervous system  Tremor  Rationale for Evaluation and Treatment: Rehabilitation  SUBJECTIVE:   SUBJECTIVE STATEMENT:  Pt went to Urgent Care 12/04/23 with dx of COVID s/p scratchy throat and headache but he is better and actually reports he is feeling the best he's felt in a long time.   Pt reports he has trouble getting on a shirt or a sports jacket.  Pt brought a binder with his HEPs in it to therapy today.  Pt reports no pain or falls and limited exercises this past week due to recovering from COVID.    Pt accompanied by: self  PERTINENT  HISTORY: PD (diagnosed 2017), MCI, trigeminal neuralgia, LBP   PRECAUTIONS: None  WEIGHT BEARING RESTRICTIONS: No  PAIN:  Are you having pain? No  WEIGHT BEARING RESTRICTIONS: No   FALLS: Has patient fallen in last 6 months? No   LIVING ENVIRONMENT: Lives with: lives with their spouse Lives in: House (2 story) Stairs: Yes: Internal: 13 steps; on right going up and has a regular flight of stairs to get to the gym, but otherwise does not need to do steps  and External: 3 steps; on right going up Has following equipment at home: None   PLOF: Independent, Vocation/Vocational requirements: Retired, Special educational needs teacher for about 30 years , and Leisure: reading   PATIENT GOALS: Wants to maintain and not feel like he's going to fall. Improve overall strength    OBJECTIVE:  Note: Objective measures were completed at Evaluation unless otherwise noted.  HAND DOMINANCE: Right  ADLs: Overall ADLs: slower, increased difficulty Transfers/ambulation related to ADLs: independent Eating: independent Grooming: independent UB Dressing: having difficulty with buttons on  dress shirt and jacket LB Dressing: difficulty with socks Toileting: independent Bathing: independent Tub Shower transfers: independent Equipment: none  IADLs: Shopping: independent  Light housekeeping: pt does day to day cleaning (has cleaning company come in every other week)  Meal Prep: Pt has been doing cooking for past year (d/t wife's thumb and recent surgery) Community mobility: independent Medication management: independent Landscape architect: independent Handwriting: Mild micrographia (and writes in print, all caps for 30 years)  MOBILITY STATUS: Independent  POSTURE COMMENTS:  rounded shoulders (slightly)    FUNCTIONAL OUTCOME MEASURES: Fastening/unfastening 3 buttons: 21.74 Physical performance test: PPT#2 (simulated eating) 12.04 &  PPT#4 (donning/doffing jacket): 16.58 sec  COORDINATION: 9 Hole Peg  test: Right: 28 sec; Left: 28 sec Box and Blocks:  Right 40 blocks, Left 41 blocks Tremors: Resting and postural and more on Rt side  UE ROM:  WFL   SENSATION: WFL   COGNITION: Overall cognitive status:  word finding difficulties  OBSERVATIONS: Bradykinesia and Postural tremors (mild)                                                                                                                    TREATMENT DATE: 12/12/23  Self Care:   Previous OTR recommended pt purchase 3 ring binder w/ pockets and some protective sleeves to keep all exercises and education organize, which pt brought to therapy today. He is encouraged to continue to bring it to therapy to allow primary OT to assist with organization of HEPs etc.  Handout provided today in page protector and added to his notebook.  .  Pt engaged in dressing activities ie) donning a button up shirts as well as working on buttons.  Pt abel to don a larger shirt with extra time and cues to swing it around his shoulders "big" and push his arms through the sleeves "big" as well.  He was able to get the shirt on by himself with at least 1/4 trials not requiring any adjustment to arms or back of shirt.  He is encouraged to consider slightly larger shirts ie) LG vs Med or even XL jacket.  When donning over the head shirt, he is encouraged to ensure the sleeves are past his elbows before lifting it over his head.  In addition, pt encouraged to practice donning his jacket daily at home over the summer to maintain this ability with max ease.   Practiced buttoning/unbuttoning shirt on table top with min cues for use of PWR! Hands prior to buttoning and use of deliberate/large amplitude movements after instruction.  Pt demo improvement with repetition and use of large amplitude movements.  Provided handouts and instruction in Memory Compensation Strategies including: -Use "WARM" strategy. W= write it down A=  associate it R=  repeat it M=  make  a mental picture Also recommended adding a physical motion to help remember things ie) why you are going to the kitchen -  make a cup in your hand to remind self about getting water or coke etc -  Continue to keep up with Memory Notebook at therapy, MD appts etc and use it to help with using a calendar to write appointments down or a schedule for the day/week.  -Use medication organizer with sections for each day or morning/evening pills as needed.  Pt currently has a location for medicine bottles at home -Keep a basket, or pegboard by the door for important items/messages needed for outings. -Use sticky notes - pt encouraged to keep sticky notes at different locations at home (bedroom, living room, kitchen, front door and in Memory notebook) to write reminders as needed  Therapeutic Activities Reviewed aspects of previously issued coordination HEP with cues to move big, open hands big etc. Pt educated in using big, deliberate movements with B UE to decrease stiffness, increase digital ROM/extension, decrease tremulous motions and overall increase UE coordination for different fine motor activities, ADLs etc.   Practiced writing a recipe with different pens with focus/min cues on letter formation/size.  Pt with minimal change in size during printing capital letters with 90% legibility.   Pt wrote in wide ruled space with focus on filling up available space with min cues.  Pt preferred a small built up pen and was encouraged to consider use of Coban to wrap writing utensil to the size he prefers.    PATIENT EDUCATION: Education details: Dressing Activities, WARM Memory strategies Person educated: Patient Education method: Explanation, Demonstration, Verbal cues, and Handouts Education comprehension: verbalized understanding, returned demonstration, verbal cues required, and needs further education  HOME EXERCISE PROGRAM: 11/21/23: Coordination Activities 11/28/23: PWR! Hands, PWR! Seated 12/12/23:  WARM Memory Compensation strategies  GOALS: Goals reviewed with patient? Yes   SHORT TERM GOALS: Target date: 12/18/23    Pt will be independent with PD specific HEP. Baseline: not yet initiated Goal status: IN Progress  2.  Pt will verbalize understanding of adapted strategies to maximize safety and independence with ADLs/IADLs. Baseline: not yet initiated Goal status: IN Progress  3.  Pt will write a paragraph with no significant decrease in size and maintain 100% legibility. Baseline: 100% legibility, mild micrographia Goal status: IN Progress  4.  Pt will demonstrate improved fine motor coordination for ADLs as evidenced by decreasing 9 hole peg test score for both hands and by 2 secs Baseline: 28 secs both hands Goal status: IN Progress  5.  Pt will demonstrate improved ease with fastening buttons as evidenced by decreasing 3 button/unbutton time by 2 seconds Baseline: 21.74 seconds Goal status: IN Progress  6.  Pt will be able to place at least 45 blocks using Rt and Lt hand with completion of Box and Blocks test. Baseline: Rt = 40 blocks, Lt = 41 blocks Goal status: IN Progress    LONG TERM GOALS: Target date: 01/17/24    Pt will verbalize understanding of ways to prevent future PD related complications and PD community resources. Baseline: not yet initiated Goal status: INITIAL  2.  Pt will verbalize understanding of ways to keep thinking skills sharp and ways to compensate for STM changes in the future. Baseline: not yet initiated Goal status: IN Progress  3.  Pt will demonstrate increased ease with dressing as evidenced by decreasing PPT#4 (don/ doff jacket) to 14 secs or less. Baseline: 16.58 secs Goal status: IN Progress  4.  Pt will demonstrate improved fine motor coordination for ADLs as evidenced by decreasing 9 hole peg test score for both hands and by 4 secs Baseline: 28 secs (bilaterally) Goal status: INITIAL   5.  Pt to report greater ease with  donning button up shirts and donning socks Baseline:  Goal status: IN PROGRESS     ASSESSMENT:  CLINICAL IMPRESSION: Patient is a 73 y.o. male who was seen today for occupational therapy treatment for Parkinson's disease and bradykinesia. Pt responded well to instruction in various FM activities and dressing tasks to maximize UE large amplitude movements to help with ease of donning clothing and in preparation for coordination and writing activities today.  Pt will benefit from further O.T. to address deficits listed below, develop PD specific HEP, prevent future related PD complications, and issue community resources.   PERFORMANCE DEFICITS: in functional skills including ADLs, IADLs, coordination, dexterity, ROM, strength, Fine motor control, Gross motor control, mobility, body mechanics, endurance, and UE functional use, cognitive skills including  word finding difficulties  IMPAIRMENTS: are limiting patient from ADLs, IADLs, rest and sleep, leisure, and social participation.   COMORBIDITIES:  may have co-morbidities  that affects occupational performance. Patient will benefit from skilled OT to address above impairments and improve overall function.  REHAB POTENTIAL: Good   PLAN:  OT FREQUENCY: 2x/week  OT DURATION: 8 weeks (PLUS EVAL)   PLANNED INTERVENTIONS: 97535 self care/ADL training, 16109 therapeutic exercise, 97530 therapeutic activity, 97112 neuromuscular re-education, 97140 manual therapy, 97113 aquatic therapy, 97010 moist heat, 97129 Cognitive training (first 15 min), 60454 Cognitive training(each additional 15 min), 09811 Orthotics management and training, 91478 Splinting (initial encounter), [id number] Subsequent splinting/medication, passive range of motion, functional mobility training, visual/perceptual remediation/compensation, patient/family education, and DME and/or AE instructions  RECOMMENDED OTHER SERVICES: none at this time  CONSULTED AND AGREED WITH PLAN OF  CARE: Patient  PLAN FOR NEXT SESSION:  Review PWR! Hands and PWR! Seated prn,  pt to continue to bring in 3 ring binder to help organize all HEP's/education.  ADL strategies, practice handwriting, jacket, buttons   Victorino Sparrow, OT 12/12/2023, 9:36 AM

## 2023-12-14 ENCOUNTER — Ambulatory Visit: Attending: Neurology | Admitting: Occupational Therapy

## 2023-12-14 DIAGNOSIS — R251 Tremor, unspecified: Secondary | ICD-10-CM | POA: Insufficient documentation

## 2023-12-14 DIAGNOSIS — R278 Other lack of coordination: Secondary | ICD-10-CM | POA: Insufficient documentation

## 2023-12-14 DIAGNOSIS — R2681 Unsteadiness on feet: Secondary | ICD-10-CM | POA: Insufficient documentation

## 2023-12-14 DIAGNOSIS — R29818 Other symptoms and signs involving the nervous system: Secondary | ICD-10-CM | POA: Diagnosis not present

## 2023-12-14 DIAGNOSIS — R293 Abnormal posture: Secondary | ICD-10-CM | POA: Diagnosis not present

## 2023-12-14 DIAGNOSIS — M6281 Muscle weakness (generalized): Secondary | ICD-10-CM | POA: Diagnosis not present

## 2023-12-14 NOTE — Therapy (Signed)
 OUTPATIENT OCCUPATIONAL THERAPY [facility]  Patient Name: William [name] MRN: [id number] DOB:[birth year], 73 y.o., male Today's Date: 12/14/2023  PCP: Claudette Head, MD REFERRING PROVIDER: Vladimir Faster, DO  END OF SESSION:  OT End of Session - 12/14/23 0850     Visit Number 5    Number of Visits 17    Date for OT Re-Evaluation 01/17/24    Authorization Type [employer] MCR    Progress Note Due on Visit 10    OT Start Time 0850    OT Stop Time 0935    OT Time Calculation (min) 45 min    Equipment Utilized During Treatment [place]    Activity Tolerance Patient tolerated treatment well    Behavior During Therapy WFL for tasks assessed/performed             Past Medical History:  Diagnosis Date   Allergic rhinitis    Anemia, iron deficiency    Arthritis    BPH associated with nocturia    ED (erectile dysfunction)    GERD (gastroesophageal reflux disease)    Hiatal hernia    History of COVID-19 11/2020   per pt moderate symptoms that resovled   History of hypertension    followed by pcp  (per pt resolved no medication since approx 2017)   History of kidney stones    Left ureteral calculus    Parkinson disease [facility])    neurologist--- dr tat;   mild jaw dyskinesia,   gait disorder, tremors   Peyronie's disease    PONV (postoperative nausea and vomiting)    S/P dilatation of esophageal stricture    Trigeminal neuropathy    left side   Ulcerative colitis    followed by dr Marina Goodell (GI)//   1992 s/p  total abdominal colectomy with ileoanal anastomosis   Wears glasses    Past Surgical History:  Procedure Laterality Date   CRANIECTOMY Left 09/16/2022   Procedure: RETROSIGMOID CRANI FOR MICROVASCULAR DECOMPRESSION;  Surgeon: Bethann Goo, DO;  Location: [facility] OR;  Service: Neurosurgery;  Laterality: Left;   CYSTOSCOPY W/ URETERAL STENT PLACEMENT  2012   for ureteral stricture   CYSTOSCOPY WITH RETROGRADE PYELOGRAM, URETEROSCOPY AND STENT PLACEMENT Left  12/31/2021   Procedure: CYSTOSCOPY WITH RETROGRADE PYELOGRAM, FLUOROSCOPIC INTERPRETATION, URETEROSCOPY  AND STENT PLACEMENT;  Surgeon: Marcine Matar, MD;  Location: [facility];  Service: Urology;  Laterality: Left;   CYSTOSCOPY/URETEROSCOPY/HOLMIUM LASER/STENT PLACEMENT  10/22/2005   @[facility] ;   w/ left ureteral balloon dilatation   HOLMIUM LASER APPLICATION Left 12/31/2021   Procedure: HOLMIUM LASER APPLICATION;  Surgeon: Marcine Matar, MD;  Location[facility];  Service: Urology;  Laterality: Left;   INGUINAL HERNIA REPAIR     per pt unilateral  approxl 1962   KNEE ARTHROSCOPY Right 02/21/2002   @[facility]    LUMBAR LAMINECTOMY/DECOMPRESSION MICRODISCECTOMY N/A 06/28/2022   Procedure: L4-5 decompression with removal of synovial cyst;  Surgeon: Venita Lick, MD;  Location: [facility] OR;  Service: Orthopedics;  Laterality: N/A;  2.5 hrs 3 C-Bed   NASAL SINUS SURGERY     x2  in 2004   RECONSTRUCTION TENDON PULLEY HAND Right 05/03/2007   @[facility] ;  right long finger   TOTAL COLECTOMY  1992   W/  ILIEOANAL ANASTOMOSIS (for ilieoanal anatomatic stricture , partial bowel obstruction and required peroidic pneumatic dilatation's)   VARICOCELECTOMY     Patient Active Problem List   Diagnosis Date Noted   Pain in finger of right hand 07/13/2023  Acute bacterial bronchitis 12/23/2022   Trigeminal neuralgia 09/16/2022   Synovial cyst of lumbar facet joint 06/28/2022   Degeneration of lumbar intervertebral disc 01/06/2022   Low back pain 12/22/2021   Trigeminal neuralgia of left side of face 10/26/2021   Somatic dysfunction of right sacroiliac joint 02/18/2020   Cough 12/09/2019   Hypertensive disorder 07/23/2019   Lumbosacral spondylosis without myelopathy 05/01/2019   URI (upper respiratory infection) 01/20/2019   Pain in right knee 02/15/2018   Chest pain 05/28/2017   Hyperlipidemia 05/28/2017   Bradycardia 05/28/2017   Chest pain, atypical 05/27/2017    Lightheadedness 05/27/2017   Finger laceration 04/04/2017   Parkinson's disease (HCC) 02/26/2016   Eustachian tube dysfunction 02/06/2016   Hyponatremia 01/26/2016   Tremor 11/12/2015   Visual-spatial impairment 11/12/2015   Stress at work 10/24/2014   Nonspecific abnormal electrocardiogram (ECG) (EKG) 06/18/2014   S/P colectomy 06/18/2014   Well adult exam 06/18/2014   Renal stones 06/18/2014   Elevated BP 06/18/2014   Headache 04/25/2014   Essential hypertension, benign 04/25/2014   Serous otitis media 03/21/2014   Travel advice encounter 04/17/2013   Calcium nephrolithiasis 10/04/2012   Routine health maintenance 10/04/2012   Acute left flank pain 08/03/2011   Unspecified intestinal obstruction 12/03/2008   Other diseases of nasal cavity and sinuses(478.19) 05/30/2008   GERD 02/26/2008   PEPTIC STRICTURE 02/22/2008   HIATAL HERNIA 02/22/2008   Chest tightness or pressure 01/09/2008   ERECTILE DYSFUNCTION 04/07/2007   Allergic rhinitis 04/07/2007   COLITIS, ULCERATIVE NOS 04/07/2007   GASTRIC POLYP, HX OF 04/07/2007    ONSET DATE: 10/11/2023  REFERRING DIAG: R27.9 (ICD-10-CM) - Discoordination R47.9 (ICD-10-CM) - Difficulty with speech Parkinson's disease with dyskinesia and fluctuating manifestations (HCC)  THERAPY DIAG:  Other lack of coordination  Other symptoms and signs involving the nervous system  Tremor  Rationale for Evaluation and Treatment: Rehabilitation  SUBJECTIVE:   SUBJECTIVE STATEMENT:  Pt reports using the memory strategy of a physical motion to remind himself to get something out of the car for his wife and it worked yesterday.  He did report that he forgot his binder today with his HEPs as it was on the dining room table.  Reminded him about creating a space at the door for items he needs to take with him.  Pt reports no pain or falls but he has been noticing dizziness ie) walking up steps and leaning over for the dog - mostly since Covid -  pt encouraged to monitor BP, take extra precautions - break down task of walking up stairs to take a breath, use a chair to feed the dog etc.   Pt accompanied by: self  PERTINENT HISTORY: PD (diagnosed 2017), [facility], trigeminal neuralgia, LBP   PRECAUTIONS: None  WEIGHT BEARING RESTRICTIONS: No  PAIN:  Are you having pain? No  WEIGHT BEARING RESTRICTIONS: No   FALLS: Has patient fallen in last 6 months? No   LIVING ENVIRONMENT: Lives with: lives with their spouse Lives in: House (2 story) Stairs: Yes: Internal: 13 steps; on right going up and has a regular flight of stairs to get to the gym, but otherwise does not need to do steps  and External: 3 steps; on right going up Has following equipment at home: None   PLOF: Independent, Vocation/Vocational requirements: Retired, Special educational needs teacher for about 30 years , and Leisure: reading   PATIENT GOALS: Wants to maintain and not feel like he's going to fall. Improve overall strength    OBJECTIVE:  Note: Objective measures were completed at Evaluation unless otherwise noted.  HAND DOMINANCE: Right  ADLs: Overall ADLs: slower, increased difficulty Transfers/ambulation related to ADLs: independent Eating: independent Grooming: independent UB Dressing: having difficulty with buttons on dress shirt and jacket LB Dressing: difficulty with socks Toileting: independent Bathing: independent Tub Shower transfers: independent Equipment: none  IADLs: Shopping: independent  Light housekeeping: pt does day to day cleaning (has cleaning company come in every other week)  Meal Prep: Pt has been doing cooking for past year (d/t wife's thumb and recent surgery) Community mobility: independent Medication management: independent Landscape architect: independent Handwriting: Mild micrographia (and writes in print, all caps for 30 years)  MOBILITY STATUS: Independent  POSTURE COMMENTS:  rounded shoulders (slightly)    FUNCTIONAL OUTCOME  MEASURES: Fastening/unfastening 3 buttons: 21.74 Physical performance test: PPT#2 (simulated eating) 12.04 &  PPT#4 (donning/doffing jacket): 16.58 sec  COORDINATION: 9 Hole Peg test: Right: 28 sec; Left: 28 sec Box and Blocks:  Right 40 blocks, Left 41 blocks Tremors: Resting and postural and more on Rt side  UE ROM:  WFL   SENSATION: WFL   COGNITION: Overall cognitive status:  word finding difficulties  OBSERVATIONS: Bradykinesia and Postural tremors (mild)                                                                                                                    TREATMENT DATE: 12/14/23  Therapeutic Activities  Practiced writing today with regular ink pen for deductive reasoning activity with focus on deliberate letter formation/size.  Pt continues to print in capital letters and with minimal change in size during printing with 90% legibility.   Pt wrote in boxes of deductive reasoning puzzle with cues to filling up space.  Pt reports working on the [employer] cross word puzzle weekly and ideas suggested of other writing tasks ie) [place], word search, etc  Pt engaged in FM tasks to work on picking up individual [employer] game pieces 1 at a time until pt had 5+ in palm to then release 1 at a time into the holes on game board.  Pt dropped a couple of game pieces but was able to lean over from seated position to pick it up from the floor without difficulty.  This is the position recommended to avoid dizziness with feeding the dog.  Pt improved with grasp of small game pieces as task proceeded and is encouraged to consider options of alternate games/activities ie) puzzles etc to work on UE coordination.  Pt encouraged to use big deliberate movements as needed to decrease stiffness, increase digital ROM/extension, decrease tremulous motions and overall increase UE coordination for different fine motor activities, ADLs etc.  Pt had limited need for big deliberate movements  today for FM coordination tasks but is still encouraged to use motions as needed with daily routine   PATIENT EDUCATION: Education details: Functional FM activities/games Person educated: Patient Education method: Explanation, Demonstration, and Verbal cues Education comprehension: verbalized understanding, returned demonstration, verbal cues required, and  needs further education  HOME EXERCISE PROGRAM: 11/21/23: Coordination Activities 11/28/23: PWR! Hands, PWR! Seated 12/12/23: WARM Memory Compensation strategies  GOALS: Goals reviewed with patient? Yes   SHORT TERM GOALS: Target date: 12/18/23    Pt will be independent with PD specific HEP. Baseline: not yet initiated Goal status: IN Progress  2.  Pt will verbalize understanding of adapted strategies to maximize safety and independence with ADLs/IADLs. Baseline: not yet initiated Goal status: IN Progress  3.  Pt will write a paragraph with no significant decrease in size and maintain 100% legibility. Baseline: 100% legibility, mild micrographia Goal status: IN Progress  4.  Pt will demonstrate improved fine motor coordination for ADLs as evidenced by decreasing 9 hole peg test score for both hands and by 2 secs Baseline: 28 secs both hands Goal status: IN Progress  5.  Pt will demonstrate improved ease with fastening buttons as evidenced by decreasing 3 button/unbutton time by 2 seconds Baseline: 21.74 seconds Goal status: IN Progress  6.  Pt will be able to place at least 45 blocks using Rt and Lt hand with completion of Box and Blocks test. Baseline: Rt = 40 blocks, Lt = 41 blocks Goal status: IN Progress    LONG TERM GOALS: Target date: 01/17/24    Pt will verbalize understanding of ways to prevent future PD related complications and PD community resources. Baseline: not yet initiated Goal status: INITIAL  2.  Pt will verbalize understanding of ways to keep thinking skills sharp and ways to compensate for STM  changes in the future. Baseline: not yet initiated Goal status: IN Progress  3.  Pt will demonstrate increased ease with dressing as evidenced by decreasing PPT#4 (don/ doff jacket) to 14 secs or less. Baseline: 16.58 secs Goal status: IN Progress  4.  Pt will demonstrate improved fine motor coordination for ADLs as evidenced by decreasing 9 hole peg test score for both hands and by 4 secs Baseline: 28 secs (bilaterally) Goal status: INITIAL   5.  Pt to report greater ease with donning button up shirts and donning socks Baseline:  Goal status: IN PROGRESS  ASSESSMENT:  CLINICAL IMPRESSION: Patient is a 73 y.o. male who was seen today for occupational therapy treatment for Parkinson's disease and bradykinesia. Pt responded well to instruction in new FM activities and writing tasks to increase coordination with use of UE large amplitude movements as needed in preparation for coordination and writing activities.  Pt will benefit from further O.T. to address deficits listed below, develop PD specific HEP, prevent future related PD complications, and issue community resources.   PERFORMANCE DEFICITS: in functional skills including ADLs, IADLs, coordination, dexterity, ROM, strength, Fine motor control, Gross motor control, mobility, body mechanics, endurance, and UE functional use, cognitive skills including  word finding difficulties  IMPAIRMENTS: are limiting patient from ADLs, IADLs, rest and sleep, leisure, and social participation.   COMORBIDITIES:  may have co-morbidities  that affects occupational performance. Patient will benefit from skilled OT to address above impairments and improve overall function.  REHAB POTENTIAL: Good   PLAN:  OT FREQUENCY: 2x/week  OT DURATION: 8 weeks (PLUS EVAL)   PLANNED INTERVENTIONS: 97535 self care/ADL training, 29562 therapeutic exercise, 97530 therapeutic activity, 97112 neuromuscular re-education, 97140 manual therapy, 97113 aquatic therapy,  97010 moist heat, 97129 Cognitive training (first 15 min), 13086 Cognitive training(each additional 15 min), 57846 Orthotics management and training, 96295 Splinting (initial encounter), [id number] Subsequent splinting/medication, passive range of motion, functional mobility training, visual/perceptual remediation/compensation, patient/family  education, and DME and/or AE instructions  RECOMMENDED OTHER SERVICES: none at this time  CONSULTED AND AGREED WITH PLAN OF CARE: Patient  PLAN FOR NEXT SESSION:  Review PWR! Hands and PWR! Seated prn,  pt to continue to bring in 3 ring binder to help organize all HEP's/education.  ADL strategies, practice handwriting, jacket, buttons   Victorino Sparrow, OT 12/14/2023, 11:19 AM

## 2023-12-15 ENCOUNTER — Ambulatory Visit: Payer: Medicare HMO

## 2023-12-15 VITALS — Ht 67.0 in | Wt 159.0 lb

## 2023-12-15 DIAGNOSIS — Z0000 Encounter for general adult medical examination without abnormal findings: Secondary | ICD-10-CM | POA: Diagnosis not present

## 2023-12-15 DIAGNOSIS — Z1159 Encounter for screening for other viral diseases: Secondary | ICD-10-CM | POA: Diagnosis not present

## 2023-12-15 NOTE — Patient Instructions (Addendum)
 William [name] , Thank you for taking time to come for your Medicare Wellness Visit. I appreciate your ongoing commitment to your health goals. Please review the following plan we discussed and let me know if I can assist you in the future.   Referrals/Orders/Follow-Ups/Clinician Recommendations: Aim for 30 minutes of exercise or brisk walking, 6-8 glasses of water, and 5 servings of fruits and vegetables each day.   This is a list of the screening recommended for you and due dates:  Health Maintenance  Topic Date Due   Hepatitis C Screening  Never done   Pneumonia Vaccine (3 of 3 - PPSV23 or PCV20) 04/04/2022   DTaP/Tdap/Td vaccine (2 - Td or Tdap) 10/04/2022   COVID-19 Vaccine (6 - 2024-25 season) 12/10/2023   Flu Shot  04/13/2024   Medicare Annual Wellness Visit  12/14/2024   Zoster (Shingles) Vaccine  Completed   HPV Vaccine  Aged Out   Colon Cancer Screening  Discontinued    Advanced directives: (Copy Requested) Please bring a copy of your health care power of attorney and living will to the office to be added to your chart at your convenience. You can mail to [facility] [address] [place], [place] [address] or email to [email or web address]  Next Medicare Annual Wellness Visit scheduled for next year: Yes

## 2023-12-15 NOTE — Progress Notes (Signed)
 Subjective:   William [name] is a 73 y.o. who presents for a Medicare Wellness preventive visit.  Visit Complete: Virtual I connected with  William [name] on 12/15/23 by a audio enabled telemedicine application and verified that I am speaking with the correct person using two identifiers.  Patient Location: Home  Provider Location: Office/Clinic  I discussed the limitations of evaluation and management by telemedicine. The patient expressed understanding and agreed to proceed.  Vital Signs: Because this visit was a virtual/telehealth visit, some criteria may be missing or patient reported. Any vitals not documented were not able to be obtained and vitals that have been documented are patient reported.  VideoDeclined- This patient declined Librarian, academic. Therefore the visit was completed with audio only.  Persons Participating in Visit: Patient.  AWV Questionnaire: No: Patient Medicare AWV questionnaire was not completed prior to this visit.  Cardiac Risk Factors include: advanced age (>100men, >61 women);dyslipidemia;hypertension;male gender     Objective:    Today's Vitals   12/15/23 1315  Weight: 159 lb (72.1 kg)  Height: 5\' 7"  (1.702 m)   Body mass index is 24.9 kg/m.     12/15/2023    1:14 PM 12/01/2023    8:44 AM 10/18/2023    9:01 AM 10/06/2023   10:20 AM 10/04/2023    8:13 AM 05/20/2023    8:30 AM 03/31/2023    9:00 AM  Advanced Directives  Does Patient Have a Medical Advance Directive? Yes Yes Yes Yes Yes Yes Yes  Type of Estate agent of [place];Living will Living will Living will Living will Living will Healthcare Power of Attorney;Living will Living will  Copy of Healthcare Power of Attorney in Chart? No - copy requested          Current Medications (verified) Outpatient Encounter Medications as of 12/15/2023  Medication Sig   amantadine (SYMMETREL) 100 MG capsule Take 1 capsule (100 mg total) by mouth 3  (three) times daily.   carbidopa-levodopa (SINEMET IR) 25-100 MG tablet TAKE 2 TABLETS BY MOUTH AT 7AM/2 TABS AT 11AM/ 2 TABS AT 4PM   ferrous sulfate 325 (65 FE) MG tablet Take 65 mg by mouth in the morning and at bedtime.   omeprazole (PRILOSEC) 40 MG capsule Take 1 capsule (40 mg total) by mouth 2 (two) times daily.   rOPINIRole (REQUIP) 3 MG tablet Take 1 tablet (3 mg total) by mouth 3 (three) times daily.   tamsulosin (FLOMAX) 0.4 MG CAPS capsule Take 0.4 mg by mouth 1 day or 1 dose. Dr. Ebbie Ridge   [DISCONTINUED] benzonatate (TESSALON) 100 MG capsule Take 1 capsule (100 mg total) by mouth 3 (three) times daily as needed for cough.   No facility-administered encounter medications on file as of 12/15/2023.    Allergies (verified) Patient has no known allergies.   History: Past Medical History:  Diagnosis Date   Allergic rhinitis    Anemia, iron deficiency    Arthritis    BPH associated with nocturia    ED (erectile dysfunction)    GERD (gastroesophageal reflux disease)    Hiatal hernia    History of COVID-19 11/2020   per pt moderate symptoms that resovled   History of hypertension    followed by pcp  (per pt resolved no medication since approx 2017)   History of kidney stones    Left ureteral calculus    Parkinson disease [facility])    neurologist--- dr tat;   mild jaw dyskinesia,  gait disorder, tremors   Peyronie's disease    PONV (postoperative nausea and vomiting)    S/P dilatation of esophageal stricture    Trigeminal neuropathy    left side   Ulcerative colitis    followed by dr Marina Goodell (GI)//   1992 s/p  total abdominal colectomy with ileoanal anastomosis   Wears glasses    Past Surgical History:  Procedure Laterality Date   CRANIECTOMY Left 09/16/2022   Procedure: RETROSIGMOID CRANI FOR MICROVASCULAR DECOMPRESSION;  Surgeon: Bethann Goo, DO;  Location: [facility] OR;  Service: Neurosurgery;  Laterality: Left;   CYSTOSCOPY W/ URETERAL STENT PLACEMENT  2012   for ureteral  stricture   CYSTOSCOPY WITH RETROGRADE PYELOGRAM, URETEROSCOPY AND STENT PLACEMENT Left 12/31/2021   Procedure: CYSTOSCOPY WITH RETROGRADE PYELOGRAM, FLUOROSCOPIC INTERPRETATION, URETEROSCOPY  AND STENT PLACEMENT;  Surgeon: Marcine Matar, MD;  Location: [facility];  Service: Urology;  Laterality: Left;   CYSTOSCOPY/URETEROSCOPY/HOLMIUM LASER/STENT PLACEMENT  10/22/2005   @[facility] ;   w/ left ureteral balloon dilatation   HOLMIUM LASER APPLICATION Left 12/31/2021   Procedure: HOLMIUM LASER APPLICATION;  Surgeon: Marcine Matar, MD;  [facility];  Service: Urology;  Laterality: Left;   INGUINAL HERNIA REPAIR     per pt unilateral  approxl 1962   KNEE ARTHROSCOPY Right 02/21/2002   @[facility]    LUMBAR LAMINECTOMY/DECOMPRESSION MICRODISCECTOMY N/A 06/28/2022   Procedure: L4-5 decompression with removal of synovial cyst;  Surgeon: Venita Lick, MD;  Location: [facility];  Service: Orthopedics;  Laterality: N/A;  2.5 hrs 3 C-Bed   NASAL SINUS SURGERY     x2  in 2004   RECONSTRUCTION TENDON PULLEY HAND Right 05/03/2007   @[facility] ;  right long finger   TOTAL COLECTOMY  1992   W/  ILIEOANAL ANASTOMOSIS (for ilieoanal anatomatic stricture , partial bowel obstruction and required peroidic pneumatic dilatation's)   VARICOCELECTOMY     Family History  Problem Relation Age of Onset   Cancer Father        rectal cancer and MM   Rectal cancer Father    Dementia Mother    Hypertension Mother    Heart disease Brother        valve repaired at 30   Colon cancer Neg Hx    Esophageal cancer Neg Hx    Stomach cancer Neg Hx    Social History   Socioeconomic History   Marital status: Married    Spouse name: Not on file   Number of children: 0   Years of education: Not on file   Highest education level: Bachelor's degree (e.g., BA, AB, BS)  Occupational History   Occupation: Haematologist: [employer]  Tobacco Use   Smoking status: Former     Current packs/day: 0.00    Types: Cigarettes    Start date: 1971    Quit date: 1981    Years since quitting: 44.2    Passive exposure: Past   Smokeless tobacco: Never  Vaping Use   Vaping status: Never Used  Substance and Sexual Activity   Alcohol use: Yes    Alcohol/week: 14.0 standard drinks of alcohol    Types: 14 Glasses of wine per week    Comment: 2 glasses of wine per night   Drug use: No   Sexual activity: Yes  Other Topics Concern   Not on file  Social History Narrative   [employer] grad   Married 13.5 years- divorced; married '05   No children  Work: Special educational needs teacher   Patient is a former smoker. Quit in 1981   Alcohol use- yes social   Daily Caffeine use 2 cups coffee per day   Illicit drug use- no   Right handed   Two story home   Social Drivers of Health   Financial Resource Strain: Low Risk  (12/15/2023)   Overall Financial Resource Strain (CARDIA)    Difficulty of Paying Living Expenses: Not hard at all  Food Insecurity: No Food Insecurity (12/15/2023)   Hunger Vital Sign    Worried About Running Out of Food in the Last Year: Never true    Ran Out of Food in the Last Year: Never true  Transportation Needs: No Transportation Needs (12/15/2023)   PRAPARE - Administrator, Civil Service (Medical): No    Lack of Transportation (Non-Medical): No  Physical Activity: Insufficiently Active (12/15/2023)   Exercise Vital Sign    Days of Exercise per Week: 3 days    Minutes of Exercise per Session: 40 min  Stress: No Stress Concern Present (12/15/2023)   [employer] of Occupational Health - Occupational Stress Questionnaire    Feeling of Stress : Not at all  Social Connections: Socially Integrated (12/15/2023)   Social Connection and Isolation Panel [NHANES]    Frequency of Communication with Friends and Family: More than three times a week    Frequency of Social Gatherings with Friends and Family: More than three times a week    Attends Religious Services:  More than 4 times per year    Active Member of [employer] or Organizations: Yes    Attends Engineer, structural: More than 4 times per year    Marital Status: Married    Tobacco Counseling Counseling given: No    Clinical Intake:  Pre-visit preparation completed: Yes  Pain : No/denies pain     BMI - recorded: 24.9 Nutritional Status: BMI of 19-24  Normal Nutritional Risks: None Diabetes: No  No results found for: "HGBA1C"   How often do you need to have someone help you when you read instructions, pamphlets, or other written materials from your doctor or pharmacy?: 1 - Never  Interpreter Needed?: No  Information entered by :: Hassell Halim, CMA   Activities of Daily Living     12/15/2023    1:19 PM  In your present state of health, do you have any difficulty performing the following activities:  Hearing? 0  Vision? 0  Difficulty concentrating or making decisions? 0  Walking or climbing stairs? 0  Dressing or bathing? 0  Doing errands, shopping? 0  Preparing Food and eating ? N  Using the Toilet? N  In the past six months, have you accidently leaked urine? N  Do you have problems with loss of bowel control? N  Managing your Medications? N  Managing your Finances? N  Housekeeping or managing your Housekeeping? N    Patient Care Team: Plotnikov, Georgina Quint, MD as PCP - General (Internal Medicine) Lennette Bihari, MD as PCP - Cardiology (Cardiology) Hilarie Fredrickson, MD as Consulting Physician (Gastroenterology) Ulanda Edison, MD (Urology) Tat, Octaviano Batty, DO as Consulting Physician (Neurology) Marcille Buffy as Consulting Physician (Optometry) Lafonda Mosses Alen Blew, MD as Consulting Physician (Urology)  Indicate any recent Medical Services you may have received from other than [facility] providers in the past year (date may be approximate).     Assessment:   This is a routine wellness examination for William [name].  Hearing/Vision screen  Hearing Screening - Comments::  Denies hearing difficulties   Vision Screening - Comments:: Wears rx glasses - up to date with routine eye exams with Dr Martha Clan   Goals Addressed               This Visit's Progress     Patient Stated (pt-stated)        Patient stated he plans to to continue to work on the exercises that he'd received from therapy.         Depression Screen     12/15/2023    1:26 PM 12/15/2023    1:23 PM 09/20/2023    2:34 PM 01/19/2023    2:49 PM 12/13/2022    3:47 PM 11/09/2022    9:16 AM 01/18/2022    1:37 PM  PHQ 2/9 Scores  PHQ - 2 Score 0 0 0 0 0 0 0  PHQ- 9 Score 0 0   0 0 0    Fall Risk     12/15/2023    1:21 PM 12/01/2023    8:44 AM 10/04/2023    8:12 AM 09/20/2023    2:34 PM 05/20/2023    8:29 AM  Fall Risk   Falls in the past year? 0 0 0 0 0  Number falls in past yr: 0 0 0 0 0  Injury with Fall? 0 0 0 0 0  Risk for fall due to : No Fall Risks   No Fall Risks   Follow up Falls prevention discussed;Falls evaluation completed Falls evaluation completed Falls evaluation completed Falls evaluation completed Falls evaluation completed    MEDICARE RISK AT HOME:  Medicare Risk at Home Any stairs in or around the home?: No If so, are there any without handrails?: No Home free of loose throw rugs in walkways, pet beds, electrical cords, etc?: Yes Adequate lighting in your home to reduce risk of falls?: Yes Life alert?: No Use of a cane, walker or w/c?: No Grab bars in the bathroom?: No Shower chair or bench in shower?: Yes Elevated toilet seat or a handicapped toilet?: No  TIMED UP AND GO:  Was the test performed?  No  Cognitive Function: 6CIT completed    07/14/2017   11:45 AM  MMSE - Mini Mental State Exam  Not completed: Refused        12/15/2023    1:22 PM 12/13/2022    3:51 PM  6CIT Screen  What Year? 0 points 0 points  What month? 0 points 0 points  What time? 0 points 0 points  Count back from 20 0 points 0 points  Months in reverse 0 points 0 points  Repeat phrase 0  points 0 points  Total Score 0 points 0 points    Immunizations Immunization History  Administered Date(s) Administered   Fluad Quad(high Dose 65+) 05/28/2020   Hepatitis A 03/20/2013   Influenza Whole 06/17/2010, 09/22/2012   Influenza, High Dose Seasonal PF 09/20/2018, 07/10/2021, 06/30/2022   Influenza,inj,Quad PF,6+ Mos 06/18/2014, 08/09/2016, 09/03/2017   Influenza-Unspecified 09/29/2015, 07/07/2017, 05/28/2020, 06/12/2023   PFIZER(Purple Top)SARS-COV-2 Vaccination 10/27/2019, 11/21/2019, 07/09/2020   Pfizer(Comirnaty)Fall Seasonal Vaccine 12 years and older 06/12/2022, 06/12/2023   Pneumococcal Conjugate-13 04/04/2017   Pneumococcal Polysaccharide-23 08/15/2008   Tdap 10/04/2012   Yellow Fever 04/13/2013   Zoster Recombinant(Shingrix) 05/10/2022, 08/17/2022   Zoster, Live 05/28/2014    Screening Tests Health Maintenance  Topic Date Due   Hepatitis C Screening  Never done   Pneumonia Vaccine 68+ Years old (3 of 3 -  PPSV23 or PCV20) 04/04/2022   DTaP/Tdap/Td (2 - Td or Tdap) 10/04/2022   COVID-19 Vaccine (6 - 2024-25 season) 12/10/2023   INFLUENZA VACCINE  04/13/2024   Medicare Annual Wellness (AWV)  12/14/2024   Zoster Vaccines- Shingrix  Completed   HPV VACCINES  Aged Out   Colonoscopy  Discontinued    Health Maintenance  Health Maintenance Due  Topic Date Due   Hepatitis C Screening  Never done   Pneumonia Vaccine 52+ Years old (3 of 3 - PPSV23 or PCV20) 04/04/2022   DTaP/Tdap/Td (2 - Td or Tdap) 10/04/2022   COVID-19 Vaccine (6 - 2024-25 season) 12/10/2023   Health Maintenance Items Addressed: Hepatitis C Screening  Additional Screening:  Vision Screening: Recommended annual ophthalmology exams for early detection of glaucoma and other disorders of the eye.  Dental Screening: Recommended annual dental exams for proper oral hygiene  Community Resource Referral / Chronic Care Management: CRR required this visit?  No   CCM required this visit?  No      Plan:     I have personally reviewed and noted the following in the patient's chart:   Medical and social history Use of alcohol, tobacco or illicit drugs  Current medications and supplements including opioid prescriptions. Patient is not currently taking opioid prescriptions. Functional ability and status Nutritional status Physical activity Advanced directives List of other physicians Hospitalizations, surgeries, and ER visits in previous 12 months Vitals Screenings to include cognitive, depression, and falls Referrals and appointments  In addition, I have reviewed and discussed with patient certain preventive protocols, quality metrics, and best practice recommendations. A written personalized care plan for preventive services as well as general preventive health recommendations were provided to patient.     Darreld Mclean, CMA   12/15/2023   After Visit Summary: (MyChart) Due to this being a telephonic visit, the after visit summary with patients personalized plan was offered to patient via MyChart   Notes: Please refer to Routing Comments.

## 2023-12-19 ENCOUNTER — Encounter: Payer: Self-pay | Admitting: Occupational Therapy

## 2023-12-19 ENCOUNTER — Ambulatory Visit: Admitting: Occupational Therapy

## 2023-12-19 DIAGNOSIS — M6281 Muscle weakness (generalized): Secondary | ICD-10-CM

## 2023-12-19 DIAGNOSIS — R29818 Other symptoms and signs involving the nervous system: Secondary | ICD-10-CM

## 2023-12-19 DIAGNOSIS — R293 Abnormal posture: Secondary | ICD-10-CM

## 2023-12-19 DIAGNOSIS — R278 Other lack of coordination: Secondary | ICD-10-CM

## 2023-12-19 DIAGNOSIS — R2681 Unsteadiness on feet: Secondary | ICD-10-CM | POA: Diagnosis not present

## 2023-12-19 DIAGNOSIS — R251 Tremor, unspecified: Secondary | ICD-10-CM | POA: Diagnosis not present

## 2023-12-19 NOTE — Patient Instructions (Signed)
 Suggestions for Handwriting Changes Many people with Parkinson's notice changes in their handwriting.  Handwriting often becomes small and cramped, and can become more difficult to control when writing for longer periods of time.  This handwriting change is called micrographia.  Why does micrographia occur?  Parkinson's can cause slowing of movement, and feelings of muscle stiffness in the hands and fingers.  Loss of automatic motion also affects the easy, flowing motion of handwriting.  This can impact even simple writing tasks such as signing your name or writing a shopping list.  Attempts to write quickly without thinking about forming each letter contributes to small, cramped handwriting, and may cause the hand to develop a feeling of tightness.  How can I make writing easier?  Make a deliberate effort to form each letter.  This can be hard to do at first, but is very effective in improving size and legibility of handwriting.  Use a pen grip (round or triangular shaped rubber or foam cylinders available at stationery stores or where writing materials are found) or a larger size pen to keep your hand more relaxed.  Try printing rather than writing in a cursive style. Printing causes you to pause briefly between each letter, keeping writing more legible.   Using lined paper may provide a "visual target" to keep all letters big when writing.   A ballpoint pen typically works better than felt tip or "rolling writer"/gel styles.  Rest your hand if it begins to feel "tight".  Pause briefly when you see your handwriting becoming smaller.  Avoid hurrying or trying to write long passages if you are feeling stressed or fatigued.  Practice helps!  Remind yourself to slow down, aim big, and pause often!  Perform "flicks"/PWR! Hands if your hand feels tight, your writing gets smaller, before you start writing, or if tremors increase.  Involving your team: An occupational therapist can provide  assessment and individual recommendations for improvement of your handwriting.  This handout was adapted from [email or web address].org [employer]      Performing Daily Activities with Big Movements  Pick at least 2 activities a day and perform with BIG, DELIBERATE movements/effort.  Try different activities each day. This can make the activity easier and turn daily activities into exercise to prevent problems in the future!  If you are standing during the activity, make sure to keep feet apart and stand with good/big/posture.  Examples: Dressing  Pull-over shirt:  good posture, bring shirt to head (don't bring head down), deliberately push arms into sleeves Jacket:  stand with feet apart, twist when putting on jacket, deliberate push arms into sleeves Underwear/Pants:  Sit, lean forward, and push foot into pants deliberately Open hands to pull down shirt/put on socks/pull up pants--get more material in your hand  Buttoning - Open hands big before fastening each button, deliberate movement (angry buttons--push button through hole), unfasten by using pull-push method   Bathing - Wash/dry with long strokes  Brushing your teeth - Big, slow movements  Cutting food - Long deliberate cuts, put tip of knife down in front of food  Eating - Hold utensil in the middle (not the end), hold fork straight up and down to stab food  Picking up a cup/bottle - Open hand up big and get object all the way in palm  Opening jar/bottle - Move as much as you can with each turn, twist wrist  Putting on seatbelt - Feet apart, twist when reaching, look at where you are reaching  Hanging up clothes/getting clothes down from closet - Reach with big effort, open hand, straighten elbow  Putting away groceries/dishes - Reach with big effort  Wiping counter/table - Move in big, long strokes, open hand  Stirring while cooking - Exaggerate movement  Cleaning windows - Feet apart, move in big, long  strokes  Sweeping/Raking- Feet apart and one foot in front of the other, move arms in big, long strokes  Vacuuming - Feet apart and one in front of the other, push with big movement  Folding clothes - Exaggerate arm movements  Washing car - Move in big, long strokes, feet apart  Changing light bulb or Using a screwdriver - Move as much as you can with each turn, twist wrist  Walking into a store/restaurant - Walk with big steps, good posture, swing arms if able  Standing up from a chair/recliner/sofa - Scoot forward, lean forward, and stand with big effort  Picking up something from floor/reaching in low cabinet - Get close to object, Position feet apart and one in front of the other.

## 2023-12-19 NOTE — Therapy (Signed)
 OUTPATIENT OCCUPATIONAL THERAPY [facility]  Patient Name: William [name] MRN: [id number] DOB:[birth year], 73 y.o., male Today's Date: 12/19/2023  PCP: Claudette Head, MD REFERRING PROVIDER: Vladimir Faster, DO  END OF SESSION:  OT End of Session - 12/19/23 0849     Visit Number 6    Number of Visits 17    Date for OT Re-Evaluation 01/17/24    Authorization Type [employer] MCR    Progress Note Due on Visit 10    OT Start Time 0848    OT Stop Time 0930    OT Time Calculation (min) 42 min    Equipment Utilized During Treatment [place]    Activity Tolerance Patient tolerated treatment well    Behavior During Therapy WFL for tasks assessed/performed             Past Medical History:  Diagnosis Date   Allergic rhinitis    Anemia, iron deficiency    Arthritis    BPH associated with nocturia    ED (erectile dysfunction)    GERD (gastroesophageal reflux disease)    Hiatal hernia    History of COVID-19 11/2020   per pt moderate symptoms that resovled   History of hypertension    followed by pcp  (per pt resolved no medication since approx 2017)   History of kidney stones    Left ureteral calculus    Parkinson disease [facility])    neurologist--- dr tat;   mild jaw dyskinesia,   gait disorder, tremors   Peyronie's disease    PONV (postoperative nausea and vomiting)    S/P dilatation of esophageal stricture    Trigeminal neuropathy    left side   Ulcerative colitis    followed by dr Marina Goodell (GI)//   1992 s/p  total abdominal colectomy with ileoanal anastomosis   Wears glasses    Past Surgical History:  Procedure Laterality Date   CRANIECTOMY Left 09/16/2022   Procedure: RETROSIGMOID CRANI FOR MICROVASCULAR DECOMPRESSION;  Surgeon: Bethann Goo, DO;  Location: [facility] OR;  Service: Neurosurgery;  Laterality: Left;   CYSTOSCOPY W/ URETERAL STENT PLACEMENT  2012   for ureteral stricture   CYSTOSCOPY WITH RETROGRADE PYELOGRAM, URETEROSCOPY AND STENT PLACEMENT Left  12/31/2021   Procedure: CYSTOSCOPY WITH RETROGRADE PYELOGRAM, FLUOROSCOPIC INTERPRETATION, URETEROSCOPY  AND STENT PLACEMENT;  Surgeon: Marcine Matar, MD;  Location: [facility];  Service: Urology;  Laterality: Left;   CYSTOSCOPY/URETEROSCOPY/HOLMIUM LASER/STENT PLACEMENT  10/22/2005   @[facility] ;   w/ left ureteral balloon dilatation   HOLMIUM LASER APPLICATION Left 12/31/2021   Procedure: HOLMIUM LASER APPLICATION;  Surgeon: Marcine Matar, MD;  Location: [facility];  Service: Urology;  Laterality: Left;   INGUINAL HERNIA REPAIR     per pt unilateral  approxl 1962   KNEE ARTHROSCOPY Right 02/21/2002   @[facility]    LUMBAR LAMINECTOMY/DECOMPRESSION MICRODISCECTOMY N/A 06/28/2022   Procedure: L4-5 decompression with removal of synovial cyst;  Surgeon: Venita Lick, MD;  Location: [facility] OR;  Service: Orthopedics;  Laterality: N/A;  2.5 hrs 3 C-Bed   NASAL SINUS SURGERY     x2  in 2004   RECONSTRUCTION TENDON PULLEY HAND Right 05/03/2007   @[facility] ;  right long finger   TOTAL COLECTOMY  1992   W/  ILIEOANAL ANASTOMOSIS (for ilieoanal anatomatic stricture , partial bowel obstruction and required peroidic pneumatic dilatation's)   VARICOCELECTOMY     Patient Active Problem List   Diagnosis Date Noted   Pain in finger of right hand 07/13/2023  Acute bacterial bronchitis 12/23/2022   Trigeminal neuralgia 09/16/2022   Synovial cyst of lumbar facet joint 06/28/2022   Degeneration of lumbar intervertebral disc 01/06/2022   Low back pain 12/22/2021   Trigeminal neuralgia of left side of face 10/26/2021   Somatic dysfunction of right sacroiliac joint 02/18/2020   Cough 12/09/2019   Hypertensive disorder 07/23/2019   Lumbosacral spondylosis without myelopathy 05/01/2019   URI (upper respiratory infection) 01/20/2019   Pain in right knee 02/15/2018   Chest pain 05/28/2017   Hyperlipidemia 05/28/2017   Bradycardia 05/28/2017   Chest pain, atypical 05/27/2017    Lightheadedness 05/27/2017   Finger laceration 04/04/2017   Parkinson's disease (HCC) 02/26/2016   Eustachian tube dysfunction 02/06/2016   Hyponatremia 01/26/2016   Tremor 11/12/2015   Visual-spatial impairment 11/12/2015   Stress at work 10/24/2014   Nonspecific abnormal electrocardiogram (ECG) (EKG) 06/18/2014   S/P colectomy 06/18/2014   Well adult exam 06/18/2014   Renal stones 06/18/2014   Elevated BP 06/18/2014   Headache 04/25/2014   Essential hypertension, benign 04/25/2014   Serous otitis media 03/21/2014   Travel advice encounter 04/17/2013   Calcium nephrolithiasis 10/04/2012   Routine health maintenance 10/04/2012   Acute left flank pain 08/03/2011   Unspecified intestinal obstruction 12/03/2008   Other diseases of nasal cavity and sinuses(478.19) 05/30/2008   GERD 02/26/2008   PEPTIC STRICTURE 02/22/2008   HIATAL HERNIA 02/22/2008   Chest tightness or pressure 01/09/2008   ERECTILE DYSFUNCTION 04/07/2007   Allergic rhinitis 04/07/2007   COLITIS, ULCERATIVE NOS 04/07/2007   GASTRIC POLYP, HX OF 04/07/2007    ONSET DATE: 10/11/2023  REFERRING DIAG: R27.9 (ICD-10-CM) - Discoordination R47.9 (ICD-10-CM) - Difficulty with speech Parkinson's disease with dyskinesia and fluctuating manifestations (HCC)  THERAPY DIAG:  Other lack of coordination  Other symptoms and signs involving the nervous system  Tremor  Unsteadiness on feet  Abnormal posture  Muscle weakness (generalized)  Rationale for Evaluation and Treatment: Rehabilitation  SUBJECTIVE:   SUBJECTIVE STATEMENT:  No pain, no falls   Pt accompanied by: self  PERTINENT HISTORY: PD (diagnosed 2017), MCI, trigeminal neuralgia, LBP   PRECAUTIONS: None  WEIGHT BEARING RESTRICTIONS: No  PAIN:  Are you having pain? No  WEIGHT BEARING RESTRICTIONS: No   FALLS: Has patient fallen in last 6 months? No   LIVING ENVIRONMENT: Lives with: lives with their spouse Lives in: House (2  story) Stairs: Yes: Internal: 13 steps; on right going up and has a regular flight of stairs to get to the gym, but otherwise does not need to do steps  and External: 3 steps; on right going up Has following equipment at home: None   PLOF: Independent, Vocation/Vocational requirements: Retired, Special educational needs teacher for about 30 years , and Leisure: reading   PATIENT GOALS: Wants to maintain and not feel like he's going to fall. Improve overall strength    OBJECTIVE:  Note: Objective measures were completed at Evaluation unless otherwise noted.  HAND DOMINANCE: Right  ADLs: Overall ADLs: slower, increased difficulty Transfers/ambulation related to ADLs: independent Eating: independent Grooming: independent UB Dressing: having difficulty with buttons on dress shirt and jacket LB Dressing: difficulty with socks Toileting: independent Bathing: independent Tub Shower transfers: independent Equipment: none  IADLs: Shopping: independent  Light housekeeping: pt does day to day cleaning (has cleaning company come in every other week)  Meal Prep: Pt has been doing cooking for past year (d/t wife's thumb and recent surgery) Community mobility: independent Medication management: independent Financial management: independent Handwriting: Mild micrographia (  and writes in print, all caps for 30 years)  MOBILITY STATUS: Independent  POSTURE COMMENTS:  rounded shoulders (slightly)    FUNCTIONAL OUTCOME MEASURES: Fastening/unfastening 3 buttons: 21.74 Physical performance test: PPT#2 (simulated eating) 12.04 &  PPT#4 (donning/doffing jacket): 16.58 sec  COORDINATION: 9 Hole Peg test: Right: 28 sec; Left: 28 sec Box and Blocks:  Right 40 blocks, Left 41 blocks Tremors: Resting and postural and more on Rt side  UE ROM:  WFL   SENSATION: WFL   COGNITION: Overall cognitive status:  word finding difficulties  OBSERVATIONS: Bradykinesia and Postural tremors (mild)                                                                                                                     TREATMENT DATE: 12/19/23  Pt issued handwriting strategies and reviewed - see pt instructions for details Pt issued ADL strategies and reviewed w/ focus on jacket, buttons, opening hand big around objects, donning socks, and opening/closing twist lids. Therapist demo each and pt return demo of jacket and opening twist lids.   Also discussed importance of staying active in community through community classes, staying social, importance of nutrition, and cognitive tips.    PATIENT EDUCATION: Education details: see above Person educated: Patient Education method: Explanation, Demonstration, and Verbal cues Education comprehension: verbalized understanding, returned demonstration, verbal cues required, and needs further education  HOME EXERCISE PROGRAM: 11/21/23: Coordination Activities 11/28/23: PWR! Hands, PWR! Seated 12/12/23: WARM Memory Compensation strategies 12/19/23: handwriting strategies, ADL strategies  GOALS: Goals reviewed with patient? Yes   SHORT TERM GOALS: Target date: 12/18/23    Pt will be independent with PD specific HEP. Baseline: not yet initiated Goal status: IN Progress  2.  Pt will verbalize understanding of adapted strategies to maximize safety and independence with ADLs/IADLs. Baseline: not yet initiated Goal status: IN Progress  3.  Pt will write a paragraph with no significant decrease in size and maintain 100% legibility. Baseline: 100% legibility, mild micrographia Goal status: IN Progress  4.  Pt will demonstrate improved fine motor coordination for ADLs as evidenced by decreasing 9 hole peg test score for both hands and by 2 secs Baseline: 28 secs both hands Goal status: IN Progress  5.  Pt will demonstrate improved ease with fastening buttons as evidenced by decreasing 3 button/unbutton time by 2 seconds Baseline: 21.74 seconds Goal status: IN  Progress  6.  Pt will be able to place at least 45 blocks using Rt and Lt hand with completion of Box and Blocks test. Baseline: Rt = 40 blocks, Lt = 41 blocks Goal status: IN Progress    LONG TERM GOALS: Target date: 01/17/24    Pt will verbalize understanding of ways to prevent future PD related complications and PD community resources. Baseline: not yet initiated Goal status: INITIAL  2.  Pt will verbalize understanding of ways to keep thinking skills sharp and ways to compensate for STM changes in the future. Baseline: not yet initiated Goal status: IN Progress  3.  Pt will demonstrate increased ease with dressing as evidenced by decreasing PPT#4 (don/ doff jacket) to 14 secs or less. Baseline: 16.58 secs Goal status: IN Progress  4.  Pt will demonstrate improved fine motor coordination for ADLs as evidenced by decreasing 9 hole peg test score for both hands and by 4 secs Baseline: 28 secs (bilaterally) Goal status: INITIAL   5.  Pt to report greater ease with donning button up shirts and donning socks Baseline:  Goal status: IN PROGRESS  ASSESSMENT:  CLINICAL IMPRESSION: Patient is a 73 y.o. male who was seen today for occupational therapy treatment for Parkinson's disease and bradykinesia. Pt responded well to instruction in ADL strategies with use of UE large amplitude movements as needed in preparation for ADLS and writing activities.  Pt will benefit from further O.T. to address deficits listed below, develop PD specific HEP, prevent future related PD complications, and issue community resources.   PERFORMANCE DEFICITS: in functional skills including ADLs, IADLs, coordination, dexterity, ROM, strength, Fine motor control, Gross motor control, mobility, body mechanics, endurance, and UE functional use, cognitive skills including  word finding difficulties  IMPAIRMENTS: are limiting patient from ADLs, IADLs, rest and sleep, leisure, and social participation.    COMORBIDITIES:  may have co-morbidities  that affects occupational performance. Patient will benefit from skilled OT to address above impairments and improve overall function.  REHAB POTENTIAL: Good   PLAN:  OT FREQUENCY: 2x/week  OT DURATION: 8 weeks (PLUS EVAL)   PLANNED INTERVENTIONS: 97535 self care/ADL training, 16109 therapeutic exercise, 97530 therapeutic activity, 97112 neuromuscular re-education, 97140 manual therapy, 97113 aquatic therapy, 97010 moist heat, 97129 Cognitive training (first 15 min), 60454 Cognitive training(each additional 15 min), 09811 Orthotics management and training, 91478 Splinting (initial encounter), [id number] Subsequent splinting/medication, passive range of motion, functional mobility training, visual/perceptual remediation/compensation, patient/family education, and DME and/or AE instructions  RECOMMENDED OTHER SERVICES: none at this time  CONSULTED AND AGREED WITH PLAN OF CARE: Patient  PLAN FOR NEXT SESSION:  Review PWR! Hands and PWR! Seated prn,  Ways to keep thinking skills sharp, ways to prevent future related complications, standing/reaching coordination tasks   Sheran Lawless, OT 12/19/2023, 8:50 AM

## 2023-12-20 ENCOUNTER — Other Ambulatory Visit

## 2023-12-20 ENCOUNTER — Other Ambulatory Visit: Payer: Self-pay | Admitting: Neurology

## 2023-12-20 DIAGNOSIS — Z1159 Encounter for screening for other viral diseases: Secondary | ICD-10-CM | POA: Diagnosis not present

## 2023-12-20 DIAGNOSIS — G20A1 Parkinson's disease without dyskinesia, without mention of fluctuations: Secondary | ICD-10-CM

## 2023-12-20 DIAGNOSIS — G20B1 Parkinson's disease with dyskinesia, without mention of fluctuations: Secondary | ICD-10-CM

## 2023-12-21 ENCOUNTER — Ambulatory Visit: Admitting: Occupational Therapy

## 2023-12-21 ENCOUNTER — Encounter: Payer: Self-pay | Admitting: Occupational Therapy

## 2023-12-21 DIAGNOSIS — M6281 Muscle weakness (generalized): Secondary | ICD-10-CM | POA: Diagnosis not present

## 2023-12-21 DIAGNOSIS — R293 Abnormal posture: Secondary | ICD-10-CM | POA: Diagnosis not present

## 2023-12-21 DIAGNOSIS — R251 Tremor, unspecified: Secondary | ICD-10-CM | POA: Diagnosis not present

## 2023-12-21 DIAGNOSIS — R278 Other lack of coordination: Secondary | ICD-10-CM | POA: Diagnosis not present

## 2023-12-21 DIAGNOSIS — R2681 Unsteadiness on feet: Secondary | ICD-10-CM | POA: Diagnosis not present

## 2023-12-21 DIAGNOSIS — R29818 Other symptoms and signs involving the nervous system: Secondary | ICD-10-CM | POA: Diagnosis not present

## 2023-12-21 LAB — HEPATITIS C ANTIBODY: Hepatitis C Ab: NONREACTIVE

## 2023-12-21 NOTE — Patient Instructions (Addendum)
 Ways to prevent future Parkinson's related complications:  1.   Exercise regularly/daily.    2.   Focus on BIGGER movements during daily activities- really reach overhead, straighten elbows and extend fingers  3.   When dressing (especially jacket/coat) or reaching for your seatbelt make sure to use your body to assist by twisting while you reach and looking at where you are reaching - this can help to minimize stress on the shoulder and reduce the risk of a rotator cuff tear  4.   Swing your arms when you walk (unless using walker)! People with PD are at increased risk for frozen shoulder and swinging your arms can reduce this risk.  5. Drink plenty of water and eat a high fiber diet (fresh fruits and veggies, nuts/seeds, fish). Avoid canned foods, red meats, and dairy when possible  6. Do NOT take your Parkinson's medication around meals (take at least 30 min before a meal, or 1 hour after a meal), especially protein as it blocks the absorption of the medicine and will not work effectively  7. Keep your feet apart when you are standing (wider stance) to allow you to have better balance and to reach further with your arms. Also make sure your feet are apart before standing up.    8. Stay social - meet with friends, do the community classes offered for PD, etc                       Keeping Thinking Skills Sharp:  1. Jigsaw puzzles 2. Card/board games 3. Talking on the phone/social events 4. [email or web address] 5. Online games 6. Word serches/crossword puzzles 7.  Logic puzzles 8. Aerobic exercise (stationary bike) 9. Eating balanced diet (fruits & veggies) 10. Drink water 11. Try something new--new recipe, hobby 12. Crafts 13. Do a variety of activities that are challenging 14.  Plan weekly meals and write a grocery list 15. Add cognitive activities to walking/exercising (think of animal/food/city with each letter of the alphabet, counting backwards, thinking of as  many vegetables as you can, etc.).--Only do this  If safe (no freezing/falls).                                  To DON jacket:   1) Wide stance 2) Hold facing tag out with hands big at sleeves 3) Swing around big like a cape to Lt side 4) Punch Lt arm in as you continue to pull behind you with Rt hand  5) Punch Rt arm in   To DOFF jacket:   1) Wide stance 2) Hold hands big at belly button level 3) Turn and twist to take off one shoulder first 4) Turn and twist to take off other shoulder 5) Reach big behind you and grab cuff of sleeve BIG and yank off one hand, then take off other hand BIG in front of you

## 2023-12-21 NOTE — Therapy (Signed)
 OUTPATIENT OCCUPATIONAL THERAPY [facility]  Patient Name: William [name] MRN: [id number] DOB:[birth year], 73 y.o., male Today's Date: 12/21/2023  PCP: Claudette Head, MD REFERRING PROVIDER: Vladimir Faster, DO  END OF SESSION:  OT End of Session - 12/21/23 0849     Visit Number 7    Number of Visits 17    Date for OT Re-Evaluation 01/17/24    Authorization Type [employer] MCR    Progress Note Due on Visit 10    OT Start Time 0845    OT Stop Time 0930    OT Time Calculation (min) 45 min    Equipment Utilized During Treatment [place]    Activity Tolerance Patient tolerated treatment well    Behavior During Therapy WFL for tasks assessed/performed             Past Medical History:  Diagnosis Date   Allergic rhinitis    Anemia, iron deficiency    Arthritis    BPH associated with nocturia    ED (erectile dysfunction)    GERD (gastroesophageal reflux disease)    Hiatal hernia    History of COVID-19 11/2020   per pt moderate symptoms that resovled   History of hypertension    followed by pcp  (per pt resolved no medication since approx 2017)   History of kidney stones    Left ureteral calculus    Parkinson disease [facility])    neurologist--- dr tat;   mild jaw dyskinesia,   gait disorder, tremors   Peyronie's disease    PONV (postoperative nausea and vomiting)    S/P dilatation of esophageal stricture    Trigeminal neuropathy    left side   Ulcerative colitis    followed by dr Marina Goodell (GI)//   1992 s/p  total abdominal colectomy with ileoanal anastomosis   Wears glasses    Past Surgical History:  Procedure Laterality Date   CRANIECTOMY Left 09/16/2022   Procedure: RETROSIGMOID CRANI FOR MICROVASCULAR DECOMPRESSION;  Surgeon: Bethann Goo, DO;  Location: [facility] OR;  Service: Neurosurgery;  Laterality: Left;   CYSTOSCOPY W/ URETERAL STENT PLACEMENT  2012   for ureteral stricture   CYSTOSCOPY WITH RETROGRADE PYELOGRAM, URETEROSCOPY AND STENT PLACEMENT Left  12/31/2021   Procedure: CYSTOSCOPY WITH RETROGRADE PYELOGRAM, FLUOROSCOPIC INTERPRETATION, URETEROSCOPY  AND STENT PLACEMENT;  Surgeon: Marcine Matar, MD;  Location: [facility];  Service: Urology;  Laterality: Left;   CYSTOSCOPY/URETEROSCOPY/HOLMIUM LASER/STENT PLACEMENT  10/22/2005   @[facility] ;   w/ left ureteral balloon dilatation   HOLMIUM LASER APPLICATION Left 12/31/2021   Procedure: HOLMIUM LASER APPLICATION;  Surgeon: Marcine Matar, MD;  Location[facility];  Service: Urology;  Laterality: Left;   INGUINAL HERNIA REPAIR     per pt unilateral  approxl 1962   KNEE ARTHROSCOPY Right 02/21/2002   @[facility]    LUMBAR LAMINECTOMY/DECOMPRESSION MICRODISCECTOMY N/A 06/28/2022   Procedure: L4-5 decompression with removal of synovial cyst;  Surgeon: Venita Lick, MD;  Location: [facility] OR;  Service: Orthopedics;  Laterality: N/A;  2.5 hrs 3 C-Bed   NASAL SINUS SURGERY     x2  in 2004   RECONSTRUCTION TENDON PULLEY HAND Right 05/03/2007   @[facility] ;  right long finger   TOTAL COLECTOMY  1992   W/  ILIEOANAL ANASTOMOSIS (for ilieoanal anatomatic stricture , partial bowel obstruction and required peroidic pneumatic dilatation's)   VARICOCELECTOMY     Patient Active Problem List   Diagnosis Date Noted   Pain in finger of right hand 07/13/2023  Acute bacterial bronchitis 12/23/2022   Trigeminal neuralgia 09/16/2022   Synovial cyst of lumbar facet joint 06/28/2022   Degeneration of lumbar intervertebral disc 01/06/2022   Low back pain 12/22/2021   Trigeminal neuralgia of left side of face 10/26/2021   Somatic dysfunction of right sacroiliac joint 02/18/2020   Cough 12/09/2019   Hypertensive disorder 07/23/2019   Lumbosacral spondylosis without myelopathy 05/01/2019   URI (upper respiratory infection) 01/20/2019   Pain in right knee 02/15/2018   Chest pain 05/28/2017   Hyperlipidemia 05/28/2017   Bradycardia 05/28/2017   Chest pain, atypical 05/27/2017    Lightheadedness 05/27/2017   Finger laceration 04/04/2017   Parkinson's disease (HCC) 02/26/2016   Eustachian tube dysfunction 02/06/2016   Hyponatremia 01/26/2016   Tremor 11/12/2015   Visual-spatial impairment 11/12/2015   Stress at work 10/24/2014   Nonspecific abnormal electrocardiogram (ECG) (EKG) 06/18/2014   S/P colectomy 06/18/2014   Well adult exam 06/18/2014   Renal stones 06/18/2014   Elevated BP 06/18/2014   Headache 04/25/2014   Essential hypertension, benign 04/25/2014   Serous otitis media 03/21/2014   Travel advice encounter 04/17/2013   Calcium nephrolithiasis 10/04/2012   Routine health maintenance 10/04/2012   Acute left flank pain 08/03/2011   Unspecified intestinal obstruction 12/03/2008   Other diseases of nasal cavity and sinuses(478.19) 05/30/2008   GERD 02/26/2008   PEPTIC STRICTURE 02/22/2008   HIATAL HERNIA 02/22/2008   Chest tightness or pressure 01/09/2008   ERECTILE DYSFUNCTION 04/07/2007   Allergic rhinitis 04/07/2007   COLITIS, ULCERATIVE NOS 04/07/2007   GASTRIC POLYP, HX OF 04/07/2007    ONSET DATE: 10/11/2023  REFERRING DIAG: R27.9 (ICD-10-CM) - Discoordination R47.9 (ICD-10-CM) - Difficulty with speech Parkinson's disease with dyskinesia and fluctuating manifestations (HCC)  THERAPY DIAG:  Other lack of coordination  Tremor  Unsteadiness on feet  Abnormal posture  Muscle weakness (generalized)  Rationale for Evaluation and Treatment: Rehabilitation  SUBJECTIVE:   SUBJECTIVE STATEMENT:  No pain, no falls. I'm having trouble with my jacket.  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   Pt accompanied by: self  PERTINENT HISTORY: PD (diagnosed 2017), MCI, trigeminal neuralgia, LBP   PRECAUTIONS: None  WEIGHT BEARING RESTRICTIONS: No  PAIN:  Are you having pain? No  WEIGHT BEARING RESTRICTIONS: No   FALLS: Has patient fallen in last 6 months? No   LIVING ENVIRONMENT: Lives with: lives with their spouse Lives in:  House (2 story) Stairs: Yes: Internal: 13 steps; on right going up and has a regular flight of stairs to get to the gym, but otherwise does not need to do steps  and External: 3 steps; on right going up Has following equipment at home: None   PLOF: Independent, Vocation/Vocational requirements: Retired, Special educational needs teacher for about 30 years , and Leisure: reading   PATIENT GOALS: Wants to maintain and not feel like he's going to fall. Improve overall strength    OBJECTIVE:  Note: Objective measures were completed at Evaluation unless otherwise noted.  HAND DOMINANCE: Right  ADLs: Overall ADLs: slower, increased difficulty Transfers/ambulation related to ADLs: independent Eating: independent Grooming: independent UB Dressing: having difficulty with buttons on dress shirt and jacket LB Dressing: difficulty with socks Toileting: independent Bathing: independent Tub Shower transfers: independent Equipment: none  IADLs: Shopping: independent  Light housekeeping: pt does day to day cleaning (has cleaning company come in every other week)  Meal Prep: Pt has been doing cooking for past year (d/t wife's thumb and recent surgery) Community mobility: independent Medication management: independent  Financial management: independent Handwriting: Mild micrographia (and writes in print, all caps for 30 years)  MOBILITY STATUS: Independent  POSTURE COMMENTS:  rounded shoulders (slightly)    FUNCTIONAL OUTCOME MEASURES: Fastening/unfastening 3 buttons: 21.74 Physical performance test: PPT#2 (simulated eating) 12.04 &  PPT#4 (donning/doffing jacket): 16.58 sec  COORDINATION: 9 Hole Peg test: Right: 28 sec; Left: 28 sec Box and Blocks:  Right 40 blocks, Left 41 blocks Tremors: Resting and postural and more on Rt side  UE ROM:  WFL   SENSATION: WFL   COGNITION: Overall cognitive status:  word finding difficulties  OBSERVATIONS: Bradykinesia and Postural tremors (mild)                                                                                                                     TREATMENT DATE: 12/21/23  Practiced donning/doffing jacket x 3 with instructions on cape method to don jacket and how to doff jacket properly.  Pt also instructed in alternative option for dress shirt.   Pt issued ways to keep thinking skills sharp and reviewed/discussed.  Pt also issued ways to prevent future related PD complications and reviewed/discussed  Also discussed importance of staying social and taking community PD exercise classes once d/c from therapy.    PATIENT EDUCATION: Education details: see above Person educated: Patient Education method: Explanation, Verbal cues, and Handouts Education comprehension: verbalized understanding and returned demonstration  HOME EXERCISE PROGRAM: 11/21/23: Coordination Activities 11/28/23: PWR! Hands, PWR! Seated 12/12/23: WARM Memory Compensation strategies 12/19/23: handwriting strategies, ADL strategies 12/21/23: cape method for jacket, ways to prevent future PD complications, ways to keep thinking skills sharp  GOALS: Goals reviewed with patient? Yes   SHORT TERM GOALS: Target date: 12/18/23    Pt will be independent with PD specific HEP. Baseline: not yet initiated Goal status: IN Progress  2.  Pt will verbalize understanding of adapted strategies to maximize safety and independence with ADLs/IADLs. Baseline: not yet initiated Goal status: MET  3.  Pt will write a paragraph with no significant decrease in size and maintain 100% legibility. Baseline: 100% legibility, mild micrographia Goal status: IN Progress  4.  Pt will demonstrate improved fine motor coordination for ADLs as evidenced by decreasing 9 hole peg test score for both hands and by 2 secs Baseline: 28 secs both hands Goal status: IN Progress  5.  Pt will demonstrate improved ease with fastening buttons as evidenced by decreasing 3 button/unbutton time by 2  seconds Baseline: 21.74 seconds Goal status: IN Progress  6.  Pt will be able to place at least 45 blocks using Rt and Lt hand with completion of Box and Blocks test. Baseline: Rt = 40 blocks, Lt = 41 blocks Goal status: IN Progress    LONG TERM GOALS: Target date: 01/17/24    Pt will verbalize understanding of ways to prevent future PD related complications and PD community resources. Baseline: not yet initiated Goal status: IN PROGRESS  2.  Pt will verbalize understanding of ways to keep thinking  skills sharp and ways to compensate for STM changes in the future. Baseline: not yet initiated Goal status: MET  3.  Pt will demonstrate increased ease with dressing as evidenced by decreasing PPT#4 (don/ doff jacket) to 14 secs or less. Baseline: 16.58 secs Goal status: IN Progress  4.  Pt will demonstrate improved fine motor coordination for ADLs as evidenced by decreasing 9 hole peg test score for both hands and by 4 secs Baseline: 28 secs (bilaterally) Goal status: INITIAL   5.  Pt to report greater ease with donning button up shirts and donning socks Baseline:  Goal status: IN PROGRESS  ASSESSMENT:  CLINICAL IMPRESSION: Patient is a 73 y.o. male who was seen today for occupational therapy treatment for Parkinson's disease and bradykinesia. Pt responded well to instruction in ADL strategies with use of UE large amplitude movements as needed in preparation for ADLS.  Pt has met or progressing towards goals. Pt will benefit from further O.T. to address deficits listed below, develop PD specific HEP, prevent future related PD complications, and issue community resources.   PERFORMANCE DEFICITS: in functional skills including ADLs, IADLs, coordination, dexterity, ROM, strength, Fine motor control, Gross motor control, mobility, body mechanics, endurance, and UE functional use, cognitive skills including  word finding difficulties  IMPAIRMENTS: are limiting patient from ADLs, IADLs,  rest and sleep, leisure, and social participation.   COMORBIDITIES:  may have co-morbidities  that affects occupational performance. Patient will benefit from skilled OT to address above impairments and improve overall function.  REHAB POTENTIAL: Good   PLAN:  OT FREQUENCY: 2x/week  OT DURATION: 8 weeks (PLUS EVAL)   PLANNED INTERVENTIONS: 97535 self care/ADL training, 16109 therapeutic exercise, 97530 therapeutic activity, 97112 neuromuscular re-education, 97140 manual therapy, 97113 aquatic therapy, 97010 moist heat, 97129 Cognitive training (first 15 min), 60454 Cognitive training(each additional 15 min), 09811 Orthotics management and training, 91478 Splinting (initial encounter), [id number] Subsequent splinting/medication, passive range of motion, functional mobility training, visual/perceptual remediation/compensation, patient/family education, and DME and/or AE instructions  RECOMMENDED OTHER SERVICES: none at this time  CONSULTED AND AGREED WITH PLAN OF CARE: Patient  PLAN FOR NEXT SESSION:  PWR! Quadraped as tolerated - especially PWR! Up and Twist Standing/reaching coordination tasks Issue community resources  (Following session PD ex chart, continue coordination, practice buttons, scarfs, review PWR! Quadraped prn)   Sheran Lawless, OT 12/21/2023, 8:50 AM

## 2023-12-26 ENCOUNTER — Ambulatory Visit: Admitting: Occupational Therapy

## 2023-12-26 DIAGNOSIS — R293 Abnormal posture: Secondary | ICD-10-CM | POA: Diagnosis not present

## 2023-12-26 DIAGNOSIS — R278 Other lack of coordination: Secondary | ICD-10-CM | POA: Diagnosis not present

## 2023-12-26 DIAGNOSIS — R29818 Other symptoms and signs involving the nervous system: Secondary | ICD-10-CM

## 2023-12-26 DIAGNOSIS — M6281 Muscle weakness (generalized): Secondary | ICD-10-CM | POA: Diagnosis not present

## 2023-12-26 DIAGNOSIS — R251 Tremor, unspecified: Secondary | ICD-10-CM | POA: Diagnosis not present

## 2023-12-26 DIAGNOSIS — R2681 Unsteadiness on feet: Secondary | ICD-10-CM | POA: Diagnosis not present

## 2023-12-26 NOTE — Patient Instructions (Addendum)
 PWR! Moves Parkinson's Disease  Exercise Class at [facility]  Wednesdays, starting December 23, 2021  Class Details:  Exciting News:  This is a new, additional set of PWR! Moves exercise classes at an additional location and time in [place]:  [place] [facility], [place], Buyer, retail at [employer]. Consists of a 45 minute exercise class per week *Wednesdays, 1:00 pm-1:45 pm *    $10 per class (Non-[employer]well Member)  Sagewell Member (Free) Packages of 4 or 8 Can be purchased Class size is limited to a maximum of 10 participants  Participant Criteria: Must be able to get up and down from the floor with minimal to no assistance   Have had 0-1 falls in the past 6 months  Ideally would have completed physical or occupational therapy at [facility] in the past or be familiar with PWR! Moves exercise class from previous sessions  Additional Info:  Please bring your own water bottle and towel Hand sanitizer and wipes will be available  Optional Wear masks Bring personal exercise mat  Contact Info:  To register, contact Irma Newness, Health & Visual merchandiser, at [facility] at [email or web address] or [phone]      PWR! Moves Parkinson's Disease Exercise Class Upcoming Session Dates:  Wednesdays, 1:00-1:45 pm - STUD[facility]  Wednesdays, starting December 23, 2021  Exciting News:   PWR! Moves exercise classes at [facility] and Fitness, [employer] [place] [employer] at [facility].  **Please arrive approximately 10 minutes early for your class for set-up and payment collection.**   Payment Options (4 class or 8 class pack)  Credit Cards Only  (Please know classes may be subject to change/cancellation due to COVID)                                             *Registration must be completed prior to beginning of class.     Directions to [facility] and  Fitness: [address] (inside [place]) [place], [place] [address] -[place] is at the corner of [address] and [address]. -You will veer to the right into the parking lot.  You can park in the outside parking lot and enter the main first floor entrance or you can park in the parking deck and enter on the first floor of the deck. -Once inside, you will stop at the front desk, sign in and let them know you are attending the PWR! Moves Exercise class in Studio [place].  (Go straight through the fitness center and Studio A will be directly ahead). -**This is a separate entrance from the Emergency Department entrance.   Please do not enter through ED.**     Local and Online Resources for Power over Parkinson's Group April 2023  LOCAL [place] PARKINSON'S GROUPS  Power over Parkinson's Group:   Power Over Parkinson's Patient Education Group will be Wednesday, April 12th-*Hybrid meting*- in person at [facility] location and via [facility] at 2:00 pm.   Upcoming Power over Parkinson's Meetings:  2nd Wednesdays of the month at 2 pm:  April 12th, May 10th Contact Amy Marriott at [email or web address] if interested in participating in this group Parkinson's Care Partners Group:    3rd Mondays, Contact Misty Paladino Atypical Parkinsonian Patient Group:   4th Wednesdays, Contact Misty Paladino If  you are interested in participating in these groups with Misty, please contact her directly for how to join those meetings.  Her contact information is [email or web address].    LOCAL EVENTS AND NEW OFFERINGS Dine out at [facility].  Celebrate Parkinson's disease Awareness Month and Support the Parkinson's Movement Disorder Fund.   Wednesday, April 19th 4-6 pm at [place], [employer].  (Give receipt to cashier and 20% will be donated) [employer]!  Play [place]!  Join [place] for home game for a fun evening to bring awareness of  Parkinson's and raise funds for our Movement Disorder Funds. April 28th 6:30 pm [address] To purchase tickets:  [email or web address] Parkinson's T-shirts for sale!  Designed by a local group member, with funds going to Movement Disorders Fund.  $20.00  Contact Misty to purchase (see email above) New PWR! Moves [employer] Instructor-Led Class offering at [employer]! Starting Wednesdays 1-2 pm, starting April 12th.   Contact Irma Newness, Visual merchandiser at [employer].  [email or web address]  ONLINE EDUCATION AND SUPPORT Parkinson Foundation:  www.parkinson.org PD Health at Home continues:  Mindfulness Mondays, Expert Briefing Tuesdays, Wellness Wednesdays, Take Time Thursdays, Fitness Fridays  Upcoming Education:  Freezing and Fall Prevention in Parkinson's.  Wednesday, April 12th at 1:00 pm Understanding Gene and Cell-Based Therapies in Parkinson's.  Wednesday, May 10th at 1:00 pm Register for expert briefings (webinars) at [email or web address] Please check out their website to sign up for emails and see their full online offerings   [employer] Foundation:  www.[email or web address]  Third Thursday Webinars:  On the third Thursday of every month at 12 p.m. ET, join our free live webinars to learn about various aspects of living with Parkinson's disease and our work to speed medical breakthroughs. Upcoming Webinar:  Brainwaves:  The Potential of Focused Ultrasound and Deep Brain Stimulation in PD.  Thursday, April 20th at  12 noon. Check out additional information on their website to see their full online offerings  [facility]:  www.[email or web address] Upcoming Webinar:   Stay tuned Webinar Series:  Living with Parkinson's Meetup.   Third Thursdays each month, 3 pm Care Partner Monthly Meetup.  With Jillene Bucks [place].  First Tuesday of each  month, 2 pm Check out additional information to Live Well Today on their website  Parkinson and Movement Disorders (PMD) Alliance:  www.pmdalliance.org NeuroLife Online:  Online Education Events Sign up for emails, which are sent weekly to give you updates on programming and online offerings  Parkinson's Association of the [place]:  www.parkinsonassociation.org Information on online support groups, education events, and online exercises including Yoga, Parkinson's exercises and more-LOTS of information on links to PD resources and online events Virtual Support Group through Parkinson's Association of the [place]; next one is scheduled for Wednesday, April 5th at 2 pm. (These are typically scheduled for the 1st Wednesday of the month at 2 pm).  Visit website for details. MOVEMENT AND EXERCISE OPPORTUNITIES Parkinson's DRUMMING Classes/Music Therapy with Albertina Parr:  This is a returning class and it's FREE!  2nd Mondays, continuing April 10th  , 11:00 at the [address] of the [employer].  Contact *Misty Taylor-Paladino at [email or web address] or Albertina Parr at [phone] or [email or web address]  PWR! Moves Classes at [facility] Exercise Room.  Wednesdays 10 and 11 am.   Contact Amy Bernie Covey, PT [email or web address] if interested. NEW PWR! Moves Class offering at [employer].  Wednesdays 1-2 pm, starting April 12th.  Contact Irma Newness, Visual merchandiser at [employer].  [email or web address] Here is a link  to the [facility]!Moves classes on Zoom from [place] - Daily Mon-Sat at 10:00. Via Zoom, FREE and open to all.  There is also a link below via Facebook if you use that platform.  CopCameras.is [email or web address]  Parkinson's  Wellness Recovery (PWR! Moves)  www.pwr4life.org Info on the PWR! Virtual Experience:  You will have access to our expertise through self-assessment, guided plans that start with the PD-specific fundamentals, educational content, tips, Q&A with an expert, and a growing Engineering geologist of PD-specific pre-recorded and live exercise classes of varying types and intensity - both physical and cognitive! If that is not enough, we offer 1:1 wellness consultations (in-person or virtual) to personalize your PWR! Dance movement psychotherapist.  Parkinson [employer] Fridays:  As part of the PD Health @ Home program, this free video series focuses each week on one aspect of fitness designed to support people living with Parkinson's.  These weekly videos highlight the Parkinson Foundation recent fitness guidelines for people with Parkinson's disease. MenusLocal.com.br Dance for PD website is offering free, live-stream classes throughout the week, as well as links to [employer] of classes:  [email or web address].[email or web address]/ Dance for Parkinson's in-person class.  February 1-April 26, Wednesdays 4-5 pm.  Free class for people with Parkinson's disease, at [address], [place].  Contact [phone] or [email or web address] to register Virtual dance and Pilates for Parkinson's classes: Click on the Community Tab> Parkinson's Movement Initiative Tab.  To register for classes and for more information, visit www.[email or web address].co.za and click the "community" tab.  [employer] Parkinson's Cycling Classes  [employer]:  Thursdays @ Noon-Live classes at [employer] ([employer] at [email or web address] or [phone]) [facility]: Virtual Classes Mondays and Thursdays Fawn Kirk classes Tuesday, Wednesday and Thursday (contact [place] at [email or web address]  or [phone]) [facility] Boxing Varied levels of classes are offered  Mondays, Tuesdays and Thursdays at [employer].  Stretching with Byrd Hesselbach weekly class is also offered for people with Parkinson's To observe a class or for more information, call [phone] or email Patricia Nettle at [email or web address] ADDITIONAL SUPPORT AND RESOURCES Well-Spring [employer]:Online Caregiver Education Opportunities:  www.well-springsolutions.org/caregiver-education/caregiver-support-group.  You may also contact Loleta Chance at [facility] -spring.org or [phone].    Well-Spring Navigator:  [employer] program, a free service to help individuals and families through the journey of determining care for older adults.  The "Navigator" is a Child psychotherapist, Sidney Ace, who will speak with a prospective client and/or loved ones to provide an assessment of the situation and a set of recommendations for a personalized care plan -- all free of charge, and whether Well-Spring [employer] offers the needed service or not. If the need is not a service we provide, we are well-connected with reputable programs in town that we can refer you to.  www.well-springsolutions.org or to speak with the Navigator, call [phone].  ADA ACCESSIBLE PARKING ADA accessible parking is available in the [employer], located behind the [employer]. For your convenience, we suggest purchasing your game tickets in advance, as there is no game ticket outlet available at that entrance. ADA ACCESSIBLE SEATING ADA accessible seating is available behind the back row of the seating bowl in sections 101, 102, 103, 106, 107, 108, 109, 112, and 114. To purchase accessible seating in advance, please call [phone] or stop by the First [employer]. For those fans with tickets for the suite level or Party Decks, an elevator is located behind Section 107. The [place], [place] and [place] gates offer accessible entrances  to [employer].  Zoom-based Classes for Parkinson's  My list: PWR! Virtual Experience [email or web address];   [place] in Motion [email or web address].hu;   The Daily Dose [email or web address]-[email or web address]

## 2023-12-26 NOTE — Therapy (Unsigned)
 OUTPATIENT OCCUPATIONAL THERAPY [facility]  Patient Name: DETRELL UMSCHEID MRN: [id number] DOB:[birth year], 73 y.o., male Today's Date: 12/26/2023  PCP: Claudette Head, MD REFERRING PROVIDER: Vladimir Faster, DO  END OF SESSION:  OT End of Session - 12/26/23 0931     Visit Number 8    Number of Visits 17    Date for OT Re-Evaluation 01/17/24    Authorization Type [employer] MCR    Progress Note Due on Visit 10    OT Start Time 0935    OT Stop Time 1015    OT Time Calculation (min) 40 min    Equipment Utilized During Treatment --    Activity Tolerance Patient tolerated treatment well    Behavior During Therapy WFL for tasks assessed/performed             Past Medical History:  Diagnosis Date   Allergic rhinitis    Anemia, iron deficiency    Arthritis    BPH associated with nocturia    ED (erectile dysfunction)    GERD (gastroesophageal reflux disease)    Hiatal hernia    History of COVID-19 11/2020   per pt moderate symptoms that resovled   History of hypertension    followed by pcp  (per pt resolved no medication since approx 2017)   History of kidney stones    Left ureteral calculus    Parkinson disease [facility])    neurologist--- dr tat;   mild jaw dyskinesia,   gait disorder, tremors   Peyronie's disease    PONV (postoperative nausea and vomiting)    S/P dilatation of esophageal stricture    Trigeminal neuropathy    left side   Ulcerative colitis    followed by dr Marina Goodell (GI)//   1992 s/p  total abdominal colectomy with ileoanal anastomosis   Wears glasses    Past Surgical History:  Procedure Laterality Date   CRANIECTOMY Left 09/16/2022   Procedure: RETROSIGMOID CRANI FOR MICROVASCULAR DECOMPRESSION;  Surgeon: Bethann Goo, DO;  Location: [facility] OR;  Service: Neurosurgery;  Laterality: Left;   CYSTOSCOPY W/ URETERAL STENT PLACEMENT  2012   for ureteral stricture   CYSTOSCOPY WITH RETROGRADE PYELOGRAM, URETEROSCOPY AND STENT PLACEMENT Left 12/31/2021    Procedure: CYSTOSCOPY WITH RETROGRADE PYELOGRAM, FLUOROSCOPIC INTERPRETATION, URETEROSCOPY  AND STENT PLACEMENT;  Surgeon: Marcine Matar, MD;  Location: [facility];  Service: Urology;  Laterality: Left;   CYSTOSCOPY/URETEROSCOPY/HOLMIUM LASER/STENT PLACEMENT  10/22/2005   @[facility] ;   w/ left ureteral balloon dilatation   HOLMIUM LASER APPLICATION Left 12/31/2021   Procedure: HOLMIUM LASER APPLICATION;  Surgeon: Marcine Matar, MD;  Location: [facility];  Service: Urology;  Laterality: Left;   INGUINAL HERNIA REPAIR     per pt unilateral  approxl 1962   KNEE ARTHROSCOPY Right 02/21/2002   @[facility]    LUMBAR LAMINECTOMY/DECOMPRESSION MICRODISCECTOMY N/A 06/28/2022   Procedure: L4-5 decompression with removal of synovial cyst;  Surgeon: Venita Lick, MD;  Location: [facility] OR;  Service: Orthopedics;  Laterality: N/A;  2.5 hrs 3 C-Bed   NASAL SINUS SURGERY     x2  in 2004   RECONSTRUCTION TENDON PULLEY HAND Right 05/03/2007   @[facility] ;  right long finger   TOTAL COLECTOMY  1992   W/  ILIEOANAL ANASTOMOSIS (for ilieoanal anatomatic stricture , partial bowel obstruction and required peroidic pneumatic dilatation's)   VARICOCELECTOMY     Patient Active Problem List   Diagnosis Date Noted   Pain in finger of right hand 07/13/2023  Acute bacterial bronchitis 12/23/2022   Trigeminal neuralgia 09/16/2022   Synovial cyst of lumbar facet joint 06/28/2022   Degeneration of lumbar intervertebral disc 01/06/2022   Low back pain 12/22/2021   Trigeminal neuralgia of left side of face 10/26/2021   Somatic dysfunction of right sacroiliac joint 02/18/2020   Cough 12/09/2019   Hypertensive disorder 07/23/2019   Lumbosacral spondylosis without myelopathy 05/01/2019   URI (upper respiratory infection) 01/20/2019   Pain in right knee 02/15/2018   Chest pain 05/28/2017   Hyperlipidemia 05/28/2017   Bradycardia 05/28/2017   Chest pain, atypical 05/27/2017   Lightheadedness  05/27/2017   Finger laceration 04/04/2017   Parkinson's disease (HCC) 02/26/2016   Eustachian tube dysfunction 02/06/2016   Hyponatremia 01/26/2016   Tremor 11/12/2015   Visual-spatial impairment 11/12/2015   Stress at work 10/24/2014   Nonspecific abnormal electrocardiogram (ECG) (EKG) 06/18/2014   S/P colectomy 06/18/2014   Well adult exam 06/18/2014   Renal stones 06/18/2014   Elevated BP 06/18/2014   Headache 04/25/2014   Essential hypertension, benign 04/25/2014   Serous otitis media 03/21/2014   Travel advice encounter 04/17/2013   Calcium nephrolithiasis 10/04/2012   Routine health maintenance 10/04/2012   Acute left flank pain 08/03/2011   Unspecified intestinal obstruction 12/03/2008   Other diseases of nasal cavity and sinuses(478.19) 05/30/2008   GERD 02/26/2008   PEPTIC STRICTURE 02/22/2008   HIATAL HERNIA 02/22/2008   Chest tightness or pressure 01/09/2008   ERECTILE DYSFUNCTION 04/07/2007   Allergic rhinitis 04/07/2007   COLITIS, ULCERATIVE NOS 04/07/2007   GASTRIC POLYP, HX OF 04/07/2007    ONSET DATE: 10/11/2023  REFERRING DIAG: R27.9 (ICD-10-CM) - Discoordination R47.9 (ICD-10-CM) - Difficulty with speech Parkinson's disease with dyskinesia and fluctuating manifestations (HCC)  THERAPY DIAG:  Other lack of coordination  Tremor  Muscle weakness (generalized)  Other symptoms and signs involving the nervous system  Rationale for Evaluation and Treatment: Rehabilitation  SUBJECTIVE:   SUBJECTIVE STATEMENT:  Pt reports some freezing or festinating with ambulation.  He is about to celebrate his 20th wedding anniversary and will be unable to come on Wednesday for therapy. His wife had some recent health crises, which are still being worked out.    Pt accompanied by: self  PERTINENT HISTORY: PD (diagnosed 2017), [facility], trigeminal neuralgia, LBP   PRECAUTIONS: None  WEIGHT BEARING RESTRICTIONS: No  PAIN:  Are you having pain? No  WEIGHT BEARING  RESTRICTIONS: No   FALLS: Has patient fallen in last 6 months? No   LIVING ENVIRONMENT: Lives with: lives with their spouse Lives in: House (2 story) Stairs: Yes: Internal: 13 steps; on right going up and has a regular flight of stairs to get to the gym, but otherwise does not need to do steps  and External: 3 steps; on right going up Has following equipment at home: None   PLOF: Independent, Vocation/Vocational requirements: Retired, Special educational needs teacher for about 30 years , and Leisure: reading   PATIENT GOALS: Wants to maintain and not feel like he's going to fall. Improve overall strength    OBJECTIVE:  Note: Objective measures were completed at Evaluation unless otherwise noted.  HAND DOMINANCE: Right  ADLs: Overall ADLs: slower, increased difficulty Transfers/ambulation related to ADLs: independent Eating: independent Grooming: independent UB Dressing: having difficulty with buttons on dress shirt and jacket LB Dressing: difficulty with socks Toileting: independent Bathing: independent Tub Shower transfers: independent Equipment: none  IADLs: Shopping: independent  Light housekeeping: pt does day to day cleaning (has cleaning company come in every  other week)  Meal Prep: Pt has been doing cooking for past year (d/t wife's thumb and recent surgery) Community mobility: independent Medication management: independent Financial management: independent Handwriting: Mild micrographia (and writes in print, all caps for 30 years)  MOBILITY STATUS: Independent  POSTURE COMMENTS:  rounded shoulders (slightly)    FUNCTIONAL OUTCOME MEASURES: Fastening/unfastening 3 buttons: 21.74 Physical performance test: PPT#2 (simulated eating) 12.04 &  PPT#4 (donning/doffing jacket): 16.58 sec  COORDINATION: 9 Hole Peg test: Right: 28 sec; Left: 28 sec Box and Blocks:  Right 40 blocks, Left 41 blocks Tremors: Resting and postural and more on Rt side  UE ROM:   WFL   SENSATION: WFL   COGNITION: Overall cognitive status:  word finding difficulties  OBSERVATIONS: Bradykinesia and Postural tremors (mild)                                                                                                                    TREATMENT :   OT initiated PWR! All Fours including UP, TWIST, and STEP. Education provided on the importance of completion and why these exercises are important given PD progression.    Pt issued PD community resources as noted in pt instructions.   OT educated and helped pt organize PD binder to locate HEPs with greater ease.   PATIENT EDUCATION: Education details: see above Person educated: Patient Education method: Explanation, Verbal cues, and Handouts Education comprehension: verbalized understanding and returned demonstration  HOME EXERCISE PROGRAM: 11/21/23: Coordination Activities 11/28/23: PWR! Hands, PWR! Seated 12/12/23: WARM Memory Compensation strategies 12/19/23: handwriting strategies, ADL strategies 12/21/23: cape method for jacket, ways to prevent future PD complications, ways to keep thinking skills sharp 12/26/2023: PWR! All Fours; Community resources + Zoom classes  GOALS: Goals reviewed with patient? Yes   SHORT TERM GOALS: Target date: 12/18/23    Pt will be independent with PD specific HEP. Baseline: not yet initiated Goal status: IN Progress  2.  Pt will verbalize understanding of adapted strategies to maximize safety and independence with ADLs/IADLs. Baseline: not yet initiated Goal status: MET  3.  Pt will write a paragraph with no significant decrease in size and maintain 100% legibility. Baseline: 100% legibility, mild micrographia Goal status: IN Progress  4.  Pt will demonstrate improved fine motor coordination for ADLs as evidenced by decreasing 9 hole peg test score for both hands and by 2 secs Baseline: 28 secs both hands Goal status: IN Progress  5.  Pt will demonstrate improved  ease with fastening buttons as evidenced by decreasing 3 button/unbutton time by 2 seconds Baseline: 21.74 seconds Goal status: IN Progress  6.  Pt will be able to place at least 45 blocks using Rt and Lt hand with completion of Box and Blocks test. Baseline: Rt = 40 blocks, Lt = 41 blocks Goal status: IN Progress    LONG TERM GOALS: Target date: 01/17/24    Pt will verbalize understanding of ways to prevent future PD related complications and PD community resources. Baseline: not yet  initiated Goal status: IN PROGRESS  2.  Pt will verbalize understanding of ways to keep thinking skills sharp and ways to compensate for STM changes in the future. Baseline: not yet initiated Goal status: MET  3.  Pt will demonstrate increased ease with dressing as evidenced by decreasing PPT#4 (don/ doff jacket) to 14 secs or less. Baseline: 16.58 secs Goal status: IN Progress  4.  Pt will demonstrate improved fine motor coordination for ADLs as evidenced by decreasing 9 hole peg test score for both hands and by 4 secs Baseline: 28 secs (bilaterally) Goal status: INITIAL   5.  Pt to report greater ease with donning button up shirts and donning socks Baseline:  Goal status: IN PROGRESS  ASSESSMENT:  CLINICAL IMPRESSION: Patient demonstrates and verbalizes good understanding OF PWR! Moves and PD resources as needed to progress towards goals. Recommend Exercise Chart to reinforce carryover of HEP outside of clinic.   PERFORMANCE DEFICITS: in functional skills including ADLs, IADLs, coordination, dexterity, ROM, strength, Fine motor control, Gross motor control, mobility, body mechanics, endurance, and UE functional use, cognitive skills including  word finding difficulties  IMPAIRMENTS: are limiting patient from ADLs, IADLs, rest and sleep, leisure, and social participation.   COMORBIDITIES:  may have co-morbidities  that affects occupational performance. Patient will benefit from skilled OT to  address above impairments and improve overall function.  REHAB POTENTIAL: Good   PLAN:  OT FREQUENCY: 2x/week  OT DURATION: 8 weeks (PLUS EVAL)   PLANNED INTERVENTIONS: 97535 self care/ADL training, 40981 therapeutic exercise, 97530 therapeutic activity, 97112 neuromuscular re-education, 97140 manual therapy, 97113 aquatic therapy, 97010 moist heat, 97129 Cognitive training (first 15 min), 19147 Cognitive training(each additional 15 min), 82956 Orthotics management and training, 21308 Splinting (initial encounter), [id number] Subsequent splinting/medication, passive range of motion, functional mobility training, visual/perceptual remediation/compensation, patient/family education, and DME and/or AE instructions  RECOMMENDED OTHER SERVICES: none at this time  CONSULTED AND AGREED WITH PLAN OF CARE: Patient  PLAN FOR NEXT SESSION: PD ex chart, continue coordination, practice buttons, scarfs, review PWR! Quadraped prn  Standing/reaching coordination tasks   Altamease Asters, [place] 12/26/2023, 12:28 PM

## 2023-12-28 ENCOUNTER — Encounter: Admitting: Occupational Therapy

## 2024-01-01 NOTE — Therapy (Signed)
 OUTPATIENT OCCUPATIONAL THERAPY [facility]  Patient Name: William [name] MRN: [id number] DOB:1951-09-02, 73 y.o., male Today's Date: 01/02/2024  PCP: Hebert Littler, MD REFERRING PROVIDER: Shirline Dover, DO  END OF SESSION:  OT End of Session - 01/02/24 0936     Visit Number 9    Number of Visits 17    Date for OT Re-Evaluation 01/17/24    Authorization Type [employer] MCR    Progress Note Due on Visit 10    OT Start Time [id number]    OT Stop Time 1015    OT Time Calculation (min) 39 min    Activity Tolerance Patient tolerated treatment well    Behavior During Therapy WFL for tasks assessed/performed              Past Medical History:  Diagnosis Date   Allergic rhinitis    Anemia, iron deficiency    Arthritis    BPH associated with nocturia    ED (erectile dysfunction)    GERD (gastroesophageal reflux disease)    Hiatal hernia    History of COVID-19 11/2020   per pt moderate symptoms that resovled   History of hypertension    followed by pcp  (per pt resolved no medication since approx 2017)   History of kidney stones    Left ureteral calculus    Parkinson disease [facility])    neurologist--- dr tat;   mild jaw dyskinesia,   gait disorder, tremors   Peyronie's disease    PONV (postoperative nausea and vomiting)    S/P dilatation of esophageal stricture    Trigeminal neuropathy    left side   Ulcerative colitis    followed by dr Elvin Hammer (GI)//   1992 s/p  total abdominal colectomy with ileoanal anastomosis   Wears glasses    Past Surgical History:  Procedure Laterality Date   CRANIECTOMY Left 09/16/2022   Procedure: RETROSIGMOID CRANI FOR MICROVASCULAR DECOMPRESSION;  Surgeon: Pincus Bridgeman, DO;  Location: [facility] OR;  Service: Neurosurgery;  Laterality: Left;   CYSTOSCOPY W/ URETERAL STENT PLACEMENT  2012   for ureteral stricture   CYSTOSCOPY WITH RETROGRADE PYELOGRAM, URETEROSCOPY AND STENT PLACEMENT Left 12/31/2021   Procedure: CYSTOSCOPY WITH RETROGRADE  PYELOGRAM, FLUOROSCOPIC INTERPRETATION, URETEROSCOPY  AND STENT PLACEMENT;  Surgeon: Trent Frizzle, MD;  Location: [facility];  Service: Urology;  Laterality: Left;   CYSTOSCOPY/URETEROSCOPY/HOLMIUM LASER/STENT PLACEMENT  10/22/2005   @[facility] ;   w/ left ureteral balloon dilatation   HOLMIUM LASER APPLICATION Left 12/31/2021   Procedure: HOLMIUM LASER APPLICATION;  Surgeon: Trent Frizzle, MD;  Location: [facility];  Service: Urology;  Laterality: Left;   INGUINAL HERNIA REPAIR     per pt unilateral  approxl 1962   KNEE ARTHROSCOPY Right 02/21/2002   @[facility]    LUMBAR LAMINECTOMY/DECOMPRESSION MICRODISCECTOMY N/A 06/28/2022   Procedure: L4-5 decompression with removal of synovial cyst;  Surgeon: Mort Ards, MD;  Location: [facility] OR;  Service: Orthopedics;  Laterality: N/A;  2.5 hrs 3 C-Bed   NASAL SINUS SURGERY     x2  in 2004   RECONSTRUCTION TENDON PULLEY HAND Right 05/03/2007   @[facility] ;  right long finger   TOTAL COLECTOMY  1992   W/  ILIEOANAL ANASTOMOSIS (for ilieoanal anatomatic stricture , partial bowel obstruction and required peroidic pneumatic dilatation's)   VARICOCELECTOMY     Patient Active Problem List   Diagnosis Date Noted   Pain in finger of right hand 07/13/2023   Acute bacterial bronchitis 12/23/2022   Trigeminal  neuralgia 09/16/2022   Synovial cyst of lumbar facet joint 06/28/2022   Degeneration of lumbar intervertebral disc 01/06/2022   Low back pain 12/22/2021   Trigeminal neuralgia of left side of face 10/26/2021   Somatic dysfunction of right sacroiliac joint 02/18/2020   Cough 12/09/2019   Hypertensive disorder 07/23/2019   Lumbosacral spondylosis without myelopathy 05/01/2019   URI (upper respiratory infection) 01/20/2019   Pain in right knee 02/15/2018   Chest pain 05/28/2017   Hyperlipidemia 05/28/2017   Bradycardia 05/28/2017   Chest pain, atypical 05/27/2017   Lightheadedness 05/27/2017   Finger laceration 04/04/2017    Parkinson's disease (HCC) 02/26/2016   Eustachian tube dysfunction 02/06/2016   Hyponatremia 01/26/2016   Tremor 11/12/2015   Visual-spatial impairment 11/12/2015   Stress at work 10/24/2014   Nonspecific abnormal electrocardiogram (ECG) (EKG) 06/18/2014   S/P colectomy 06/18/2014   Well adult exam 06/18/2014   Renal stones 06/18/2014   Elevated BP 06/18/2014   Headache 04/25/2014   Essential hypertension, benign 04/25/2014   Serous otitis media 03/21/2014   Travel advice encounter 04/17/2013   Calcium nephrolithiasis 10/04/2012   Routine health maintenance 10/04/2012   Acute left flank pain 08/03/2011   Unspecified intestinal obstruction 12/03/2008   Other diseases of nasal cavity and sinuses(478.19) 05/30/2008   GERD 02/26/2008   PEPTIC STRICTURE 02/22/2008   HIATAL HERNIA 02/22/2008   Chest tightness or pressure 01/09/2008   ERECTILE DYSFUNCTION 04/07/2007   Allergic rhinitis 04/07/2007   COLITIS, ULCERATIVE NOS 04/07/2007   GASTRIC POLYP, HX OF 04/07/2007    ONSET DATE: 10/11/2023  REFERRING DIAG: R27.9 (ICD-10-CM) - Discoordination R47.9 (ICD-10-CM) - Difficulty with speech Parkinson's disease with dyskinesia and fluctuating manifestations (HCC)  THERAPY DIAG:  Other lack of coordination  Tremor  Muscle weakness (generalized)  Other symptoms and signs involving the nervous system  Rationale for Evaluation and Treatment: Rehabilitation  SUBJECTIVE:   SUBJECTIVE STATEMENT:  Pt reports he feels buttons and jacket are going fairly well.    Pt accompanied by: self  PERTINENT HISTORY: PD (diagnosed 2017), [facility], trigeminal neuralgia, LBP   PRECAUTIONS: None  WEIGHT BEARING RESTRICTIONS: No  PAIN:  Are you having pain? No  WEIGHT BEARING RESTRICTIONS: No   FALLS: Has patient fallen in last 6 months? No   LIVING ENVIRONMENT: Lives with: lives with their spouse Lives in: House (2 story) Stairs: Yes: Internal: 13 steps; on right going up and has a  regular flight of stairs to get to the gym, but otherwise does not need to do steps  and External: 3 steps; on right going up Has following equipment at home: None   PLOF: Independent, Vocation/Vocational requirements: Retired, Special educational needs teacher for about 30 years , and Leisure: reading   PATIENT GOALS: Wants to maintain and not feel like he's going to fall. Improve overall strength    OBJECTIVE:  Note: Objective measures were completed at Evaluation unless otherwise noted.  HAND DOMINANCE: Right  ADLs: Overall ADLs: slower, increased difficulty Transfers/ambulation related to ADLs: independent Eating: independent Grooming: independent UB Dressing: having difficulty with buttons on dress shirt and jacket LB Dressing: difficulty with socks Toileting: independent Bathing: independent Tub Shower transfers: independent Equipment: none  IADLs: Shopping: independent  Light housekeeping: pt does day to day cleaning (has cleaning company come in every other week)  Meal Prep: Pt has been doing cooking for past year (d/t wife's thumb and recent surgery) Community mobility: independent Medication management: independent Financial management: independent Handwriting: Mild micrographia (and writes in print, all caps  for 30 years)  MOBILITY STATUS: Independent  POSTURE COMMENTS:  rounded shoulders (slightly)    FUNCTIONAL OUTCOME MEASURES: Fastening/unfastening 3 buttons: 21.74 Physical performance test: PPT#2 (simulated eating) 12.04 &  PPT#4 (donning/doffing jacket): 16.58 sec  COORDINATION: 9 Hole Peg test: Right: 28 sec; Left: 28 sec Box and Blocks:  Right 40 blocks, Left 41 blocks Tremors: Resting and postural and more on Rt side  UE ROM:  WFL   SENSATION: WFL   COGNITION: Overall cognitive status:  word finding difficulties  OBSERVATIONS: Bradykinesia and Postural tremors (mild)                                                                                                                     TREATMENT :   OT initiated PD exercise chart as noted in pt instructions.   OT reviewed buttons and jacket strategies. Pt requiring min cueing for improved functional completion.   OT educated pt on play of Golf [employer] in standing for BUE ROM, coordination, visual processing, scanning, and sequencing. Pt required minimal cues for proper play.   PATIENT EDUCATION: Education details: see above Person educated: Patient Education method: Explanation, Verbal cues, and Handouts Education comprehension: verbalized understanding and returned demonstration  HOME EXERCISE PROGRAM: 11/21/23: Coordination Activities 11/28/23: PWR! Hands, PWR! Seated 12/12/23: WARM Memory Compensation strategies 12/19/23: handwriting strategies, ADL strategies 12/21/23: cape method for jacket, ways to prevent future PD complications, ways to keep thinking skills sharp 12/26/2023: PWR! All Fours; Community resources + Zoom classes 01/02/2024: PD exercise chart  GOALS: Goals reviewed with patient? Yes  SHORT TERM GOALS: Target date: 12/18/23    Pt will be independent with PD specific HEP. Baseline: not yet initiated Goal status: IN Progress  2.  Pt will verbalize understanding of adapted strategies to maximize safety and independence with ADLs/IADLs. Baseline: not yet initiated Goal status: MET  3.  Pt will write a paragraph with no significant decrease in size and maintain 100% legibility. Baseline: 100% legibility, mild micrographia Goal status: IN Progress  4.  Pt will demonstrate improved fine motor coordination for ADLs as evidenced by decreasing 9 hole peg test score for both hands and by 2 secs Baseline: 28 secs both hands Goal status: IN Progress  5.  Pt will demonstrate improved ease with fastening buttons as evidenced by decreasing 3 button/unbutton time by 2 seconds Baseline: 21.74 seconds Goal status: IN Progress  6.  Pt will be able to place at least 45 blocks using  Rt and Lt hand with completion of Box and Blocks test. Baseline: Rt = 40 blocks, Lt = 41 blocks Goal status: IN Progress  LONG TERM GOALS: Target date: 01/17/24    Pt will verbalize understanding of ways to prevent future PD related complications and PD community resources. Baseline: not yet initiated Goal status: IN PROGRESS  2.  Pt will verbalize understanding of ways to keep thinking skills sharp and ways to compensate for STM changes in the future. Baseline: not yet initiated Goal status: MET  3.  Pt will demonstrate increased ease with dressing as evidenced by decreasing PPT#4 (don/ doff jacket) to 14 secs or less. Baseline: 16.58 secs Goal status: IN Progress  4.  Pt will demonstrate improved fine motor coordination for ADLs as evidenced by decreasing 9 hole peg test score for both hands and by 4 secs Baseline: 28 secs (bilaterally) Goal status: INITIAL   5.  Pt to report greater ease with donning button up shirts and donning socks Baseline:  Goal status: IN PROGRESS  ASSESSMENT:  CLINICAL IMPRESSION: Patient demonstrates good understanding of functional tasks this visit as needed to progress towards goals. Will assess overall progression towards goals at next visit.   PERFORMANCE DEFICITS: in functional skills including ADLs, IADLs, coordination, dexterity, ROM, strength, Fine motor control, Gross motor control, mobility, body mechanics, endurance, and UE functional use, cognitive skills including  word finding difficulties  IMPAIRMENTS: are limiting patient from ADLs, IADLs, rest and sleep, leisure, and social participation.   COMORBIDITIES:  may have co-morbidities  that affects occupational performance. Patient will benefit from skilled OT to address above impairments and improve overall function.  REHAB POTENTIAL: Good  PLAN:  OT FREQUENCY: 2x/week  OT DURATION: 8 weeks (PLUS EVAL)   PLANNED INTERVENTIONS: 97535 self care/ADL training, 40981 therapeutic  exercise, 97530 therapeutic activity, 97112 neuromuscular re-education, 97140 manual therapy, 97113 aquatic therapy, 97010 moist heat, 97129 Cognitive training (first 15 min), 19147 Cognitive training(each additional 15 min), 82956 Orthotics management and training, 21308 Splinting (initial encounter), [id number] Subsequent splinting/medication, passive range of motion, functional mobility training, visual/perceptual remediation/compensation, patient/family education, and DME and/or AE instructions  RECOMMENDED OTHER SERVICES: none at this time  CONSULTED AND AGREED WITH PLAN OF CARE: Patient  PLAN FOR NEXT SESSION: 10th visit progress note  continue coordination, scarfs, review PWR! Quadraped prn  Standing/reaching coordination tasks   Altamease Asters, OT 01/02/2024, 9:37 AM

## 2024-01-02 ENCOUNTER — Other Ambulatory Visit: Payer: Self-pay | Admitting: Neurology

## 2024-01-02 ENCOUNTER — Ambulatory Visit: Admitting: Occupational Therapy

## 2024-01-02 DIAGNOSIS — R278 Other lack of coordination: Secondary | ICD-10-CM | POA: Diagnosis not present

## 2024-01-02 DIAGNOSIS — R293 Abnormal posture: Secondary | ICD-10-CM | POA: Diagnosis not present

## 2024-01-02 DIAGNOSIS — G20B2 Parkinson's disease with dyskinesia, with fluctuations: Secondary | ICD-10-CM

## 2024-01-02 DIAGNOSIS — M6281 Muscle weakness (generalized): Secondary | ICD-10-CM

## 2024-01-02 DIAGNOSIS — R29818 Other symptoms and signs involving the nervous system: Secondary | ICD-10-CM

## 2024-01-02 DIAGNOSIS — R2681 Unsteadiness on feet: Secondary | ICD-10-CM | POA: Diagnosis not present

## 2024-01-02 DIAGNOSIS — R251 Tremor, unspecified: Secondary | ICD-10-CM | POA: Diagnosis not present

## 2024-01-02 NOTE — Patient Instructions (Signed)
(  Exercise) Monday Tuesday Wednesday Thursday Friday Saturday Sunday  Coordination         PWR! All Fours         PWR! Hands         PWR! Sitting         PWR! Standing         PT exercises         ST exercises         Buttons         Jacket         Writing           The goal is to do at least 1 exercise everyday and each exercise at least once a week.

## 2024-01-04 ENCOUNTER — Ambulatory Visit: Admitting: Occupational Therapy

## 2024-01-04 DIAGNOSIS — R278 Other lack of coordination: Secondary | ICD-10-CM | POA: Diagnosis not present

## 2024-01-04 DIAGNOSIS — M6281 Muscle weakness (generalized): Secondary | ICD-10-CM

## 2024-01-04 DIAGNOSIS — R251 Tremor, unspecified: Secondary | ICD-10-CM

## 2024-01-04 DIAGNOSIS — R293 Abnormal posture: Secondary | ICD-10-CM

## 2024-01-04 DIAGNOSIS — R2681 Unsteadiness on feet: Secondary | ICD-10-CM

## 2024-01-04 DIAGNOSIS — R29818 Other symptoms and signs involving the nervous system: Secondary | ICD-10-CM | POA: Diagnosis not present

## 2024-01-04 NOTE — Therapy (Signed)
 OUTPATIENT OCCUPATIONAL THERAPY [facility]  Patient Name: QAMAR AUGHENBAUGH MRN: [id number] DOB:03/05/1951, 73 y.o., male Today's Date: 01/04/2024  PCP: Hebert Littler, MD REFERRING PROVIDER: Shirline Dover, DO  END OF SESSION:  OT End of Session - 01/04/24 0858     Visit Number 10    Number of Visits 17    Date for OT Re-Evaluation 01/17/24    Authorization Type [employer] MCR    Progress Note Due on Visit 10    OT Start Time [phone]    OT Stop Time 0930    OT Time Calculation (min) 44 min    Activity Tolerance Patient tolerated treatment well    Behavior During Therapy [facility] for tasks assessed/performed              Past Medical History:  Diagnosis Date   Allergic rhinitis    Anemia, iron deficiency    Arthritis    BPH associated with nocturia    ED (erectile dysfunction)    GERD (gastroesophageal reflux disease)    Hiatal hernia    History of COVID-19 11/2020   per pt moderate symptoms that resovled   History of hypertension    followed by pcp  (per pt resolved no medication since approx 2017)   History of kidney stones    Left ureteral calculus    Parkinson disease [facility])    neurologist--- dr tat;   mild jaw dyskinesia,   gait disorder, tremors   Peyronie's disease    PONV (postoperative nausea and vomiting)    S/P dilatation of esophageal stricture    Trigeminal neuropathy    left side   Ulcerative colitis    followed by dr Elvin Hammer (GI)//   1992 s/p  total abdominal colectomy with ileoanal anastomosis   Wears glasses    Past Surgical History:  Procedure Laterality Date   CRANIECTOMY Left 09/16/2022   Procedure: RETROSIGMOID CRANI FOR MICROVASCULAR DECOMPRESSION;  Surgeon: Pincus Bridgeman, DO;  Location: [facility] OR;  Service: Neurosurgery;  Laterality: Left;   CYSTOSCOPY W/ URETERAL STENT PLACEMENT  2012   for ureteral stricture   CYSTOSCOPY WITH RETROGRADE PYELOGRAM, URETEROSCOPY AND STENT PLACEMENT Left 12/31/2021   Procedure: CYSTOSCOPY WITH RETROGRADE  PYELOGRAM, FLUOROSCOPIC INTERPRETATION, URETEROSCOPY  AND STENT PLACEMENT;  Surgeon: Trent Frizzle, MD;  Location[facility];  Service: Urology;  Laterality: Left;   CYSTOSCOPY/URETEROSCOPY/HOLMIUM LASER/STENT PLACEMENT  10/22/2005   @[facility] ;   w/ left ureteral balloon dilatation   HOLMIUM LASER APPLICATION Left 12/31/2021   Procedure: HOLMIUM LASER APPLICATION;  Surgeon: Trent Frizzle, MD;  Location: [facility];  Service: Urology;  Laterality: Left;   INGUINAL HERNIA REPAIR     per pt unilateral  approxl 1962   KNEE ARTHROSCOPY Right 02/21/2002   @[facility]    LUMBAR LAMINECTOMY/DECOMPRESSION MICRODISCECTOMY N/A 06/28/2022   Procedure: L4-5 decompression with removal of synovial cyst;  Surgeon: Mort Ards, MD;  Location: [facility] OR;  Service: Orthopedics;  Laterality: N/A;  2.5 hrs 3 C-Bed   NASAL SINUS SURGERY     x2  in 2004   RECONSTRUCTION TENDON PULLEY HAND Right 05/03/2007   @[facility] ;  right long finger   TOTAL COLECTOMY  1992   W/  ILIEOANAL ANASTOMOSIS (for ilieoanal anatomatic stricture , partial bowel obstruction and required peroidic pneumatic dilatation's)   VARICOCELECTOMY     Patient Active Problem List   Diagnosis Date Noted   Pain in finger of right hand 07/13/2023   Acute bacterial bronchitis 12/23/2022   Trigeminal  neuralgia 09/16/2022   Synovial cyst of lumbar facet joint 06/28/2022   Degeneration of lumbar intervertebral disc 01/06/2022   Low back pain 12/22/2021   Trigeminal neuralgia of left side of face 10/26/2021   Somatic dysfunction of right sacroiliac joint 02/18/2020   Cough 12/09/2019   Hypertensive disorder 07/23/2019   Lumbosacral spondylosis without myelopathy 05/01/2019   URI (upper respiratory infection) 01/20/2019   Pain in right knee 02/15/2018   Chest pain 05/28/2017   Hyperlipidemia 05/28/2017   Bradycardia 05/28/2017   Chest pain, atypical 05/27/2017   Lightheadedness 05/27/2017   Finger laceration 04/04/2017    Parkinson's disease (HCC) 02/26/2016   Eustachian tube dysfunction 02/06/2016   Hyponatremia 01/26/2016   Tremor 11/12/2015   Visual-spatial impairment 11/12/2015   Stress at work 10/24/2014   Nonspecific abnormal electrocardiogram (ECG) (EKG) 06/18/2014   S/P colectomy 06/18/2014   Well adult exam 06/18/2014   Renal stones 06/18/2014   Elevated BP 06/18/2014   Headache 04/25/2014   Essential hypertension, benign 04/25/2014   Serous otitis media 03/21/2014   Travel advice encounter 04/17/2013   Calcium nephrolithiasis 10/04/2012   Routine health maintenance 10/04/2012   Acute left flank pain 08/03/2011   Unspecified intestinal obstruction 12/03/2008   Other diseases of nasal cavity and sinuses(478.19) 05/30/2008   GERD 02/26/2008   PEPTIC STRICTURE 02/22/2008   HIATAL HERNIA 02/22/2008   Chest tightness or pressure 01/09/2008   ERECTILE DYSFUNCTION 04/07/2007   Allergic rhinitis 04/07/2007   COLITIS, ULCERATIVE NOS 04/07/2007   GASTRIC POLYP, HX OF 04/07/2007    ONSET DATE: 10/11/2023  REFERRING DIAG: R27.9 (ICD-10-CM) - Discoordination R47.9 (ICD-10-CM) - Difficulty with speech Parkinson's disease with dyskinesia and fluctuating manifestations (HCC)  THERAPY DIAG:  Other lack of coordination  Tremor  Muscle weakness (generalized)  Unsteadiness on feet  Abnormal posture  Other symptoms and signs involving the nervous system  Rationale for Evaluation and Treatment: Rehabilitation  SUBJECTIVE:   SUBJECTIVE STATEMENT:  No pain, no falls. Pt reports improvements with daily tasks   Pt accompanied by: self  PERTINENT HISTORY: PD (diagnosed 2017), MCI, trigeminal neuralgia, LBP   PRECAUTIONS: None  WEIGHT BEARING RESTRICTIONS: No  PAIN:  Are you having pain? No  WEIGHT BEARING RESTRICTIONS: No   FALLS: Has patient fallen in last 6 months? No   LIVING ENVIRONMENT: Lives with: lives with their spouse Lives in: House (2 story) Stairs: Yes: Internal:  13 steps; on right going up and has a regular flight of stairs to get to the gym, but otherwise does not need to do steps  and External: 3 steps; on right going up Has following equipment at home: None   PLOF: Independent, Vocation/Vocational requirements: Retired, Special educational needs teacher for about 30 years , and Leisure: reading   PATIENT GOALS: Wants to maintain and not feel like he's going to fall. Improve overall strength    OBJECTIVE:  Note: Objective measures were completed at Evaluation unless otherwise noted.  HAND DOMINANCE: Right  ADLs: Overall ADLs: slower, increased difficulty Transfers/ambulation related to ADLs: independent Eating: independent Grooming: independent UB Dressing: having difficulty with buttons on dress shirt and jacket LB Dressing: difficulty with socks Toileting: independent Bathing: independent Tub Shower transfers: independent Equipment: none  IADLs: Shopping: independent  Light housekeeping: pt does day to day cleaning (has cleaning company come in every other week)  Meal Prep: Pt has been doing cooking for past year (d/t wife's thumb and recent surgery) Community mobility: independent Medication management: independent Financial management: independent Handwriting: Mild micrographia (and  writes in print, all caps for 30 years)  MOBILITY STATUS: Independent  POSTURE COMMENTS:  rounded shoulders (slightly)    FUNCTIONAL OUTCOME MEASURES: Fastening/unfastening 3 buttons: 21.74 Physical performance test: PPT#2 (simulated eating) 12.04 &  PPT#4 (donning/doffing jacket): 16.58 sec  COORDINATION: 9 Hole Peg test: Right: 28 sec; Left: 28 sec Box and Blocks:  Right 40 blocks, Left 41 blocks Tremors: Resting and postural and more on Rt side  UE ROM:  WFL   SENSATION: WFL   COGNITION: Overall cognitive status:  word finding difficulties  OBSERVATIONS: Bradykinesia and Postural tremors (mild)                                                                                                                     TREATMENT :   Helped pt fill out PD exercise chart for daily exercises.   Assessed STG's and progress towards LTG's for 10th progress note update. (See goal section)  Pt shown strategy for donning socks with more efficiency  Tossing scarf in air and catching with opposite hand to encourage large amplitude movements, gross motor coordination, and dual tasking when progressing to doing with ambulation.   PATIENT EDUCATION: Education details: see above Person educated: Patient Education method: Explanation, Verbal cues, and Handouts Education comprehension: verbalized understanding and returned demonstration  HOME EXERCISE PROGRAM: 11/21/23: Coordination Activities 11/28/23: PWR! Hands, PWR! Seated 12/12/23: WARM Memory Compensation strategies 12/19/23: handwriting strategies, ADL strategies 12/21/23: cape method for jacket, ways to prevent future PD complications, ways to keep thinking skills sharp 12/26/2023: PWR! All Fours; Community resources + Zoom classes 01/02/2024: PD exercise chart  GOALS: Goals reviewed with patient? Yes  SHORT TERM GOALS: Target date: 12/18/23    Pt will be independent with PD specific HEP. Baseline: not yet initiated Goal status: MET  2.  Pt will verbalize understanding of adapted strategies to maximize safety and independence with ADLs/IADLs. Baseline: not yet initiated Goal status: MET  3.  Pt will write a paragraph with no significant decrease in size and maintain 100% legibility. Baseline: 100% legibility, mild micrographia Goal status: MET  4.  Pt will demonstrate improved fine motor coordination for ADLs as evidenced by decreasing 9 hole peg test score for both hands and by 2 secs Baseline: 28 secs both hands Goal status: MET (01/04/24: Rt = 22 sec, Lt = 24 sec)  5.  Pt will demonstrate improved ease with fastening buttons as evidenced by decreasing 3 button/unbutton time by 2  seconds Baseline: 21.74 seconds Goal status: NOT MET (01/04/24: 29 sec)   6.  Pt will be able to place at least 45 blocks using Rt and Lt hand with completion of Box and Blocks test. Baseline: Rt = 40 blocks, Lt = 41 blocks Goal status: MET (01/04/24: Rt = 51, Lt = 50)  LONG TERM GOALS: Target date: 01/17/24    Pt will verbalize understanding of ways to prevent future PD related complications and PD community resources. Baseline: not yet initiated Goal status: MET  2.  Pt will verbalize understanding of ways to keep thinking skills sharp and ways to compensate for STM changes in the future. Baseline: not yet initiated Goal status: MET  3.  Pt will demonstrate increased ease with dressing as evidenced by decreasing PPT#4 (don/ doff jacket) to 14 secs or less. Baseline: 16.58 secs Goal status: IN Progress  4.  Pt will demonstrate improved fine motor coordination for ADLs as evidenced by decreasing 9 hole peg test score for both hands and by 4 secs Baseline: 28 secs (bilaterally) Goal status: MET (01/04/24: Rt = 22 sec, Lt = 24 sec)   5.  Pt to report greater ease with donning button up shirts and donning socks Baseline:  Goal status: IN PROGRESS  ASSESSMENT:  CLINICAL IMPRESSION: This 10th progress note is for dates: 11/17/23 to 01/04/24. Pt has met 5/6 STG's and already has met 3/5 LTG's. Pt progressing with all areas of function and reports increased awareness.   PERFORMANCE DEFICITS: in functional skills including ADLs, IADLs, coordination, dexterity, ROM, strength, Fine motor control, Gross motor control, mobility, body mechanics, endurance, and UE functional use, cognitive skills including  word finding difficulties  IMPAIRMENTS: are limiting patient from ADLs, IADLs, rest and sleep, leisure, and social participation.   COMORBIDITIES:  may have co-morbidities  that affects occupational performance. Patient will benefit from skilled OT to address above impairments and improve  overall function.  REHAB POTENTIAL: Good  PLAN:  OT FREQUENCY: 2x/week  OT DURATION: 8 weeks (PLUS EVAL)   PLANNED INTERVENTIONS: 97535 self care/ADL training, 19147 therapeutic exercise, 97530 therapeutic activity, 97112 neuromuscular re-education, 97140 manual therapy, 97113 aquatic therapy, 97010 moist heat, 97129 Cognitive training (first 15 min), 82956 Cognitive training(each additional 15 min), 21308 Orthotics management and training, 65784 Splinting (initial encounter), [id number] Subsequent splinting/medication, passive range of motion, functional mobility training, visual/perceptual remediation/compensation, patient/family education, and DME and/or AE instructions  RECOMMENDED OTHER SERVICES: none at this time  CONSULTED AND AGREED WITH PLAN OF CARE: Patient  PLAN FOR NEXT SESSION:  continue coordination, scarfs, review PWR! Quadraped prn  Standing/reaching coordination tasks - add cognitive component as able   Velinda Getting, OT 01/04/2024, 8:59 AM

## 2024-01-09 ENCOUNTER — Encounter: Payer: Self-pay | Admitting: Occupational Therapy

## 2024-01-09 ENCOUNTER — Ambulatory Visit: Admitting: Occupational Therapy

## 2024-01-09 DIAGNOSIS — R293 Abnormal posture: Secondary | ICD-10-CM

## 2024-01-09 DIAGNOSIS — R278 Other lack of coordination: Secondary | ICD-10-CM | POA: Diagnosis not present

## 2024-01-09 DIAGNOSIS — R2681 Unsteadiness on feet: Secondary | ICD-10-CM | POA: Diagnosis not present

## 2024-01-09 DIAGNOSIS — R251 Tremor, unspecified: Secondary | ICD-10-CM | POA: Diagnosis not present

## 2024-01-09 DIAGNOSIS — R29818 Other symptoms and signs involving the nervous system: Secondary | ICD-10-CM | POA: Diagnosis not present

## 2024-01-09 DIAGNOSIS — M6281 Muscle weakness (generalized): Secondary | ICD-10-CM

## 2024-01-09 NOTE — Therapy (Signed)
 OUTPATIENT OCCUPATIONAL THERAPY [facility]  Patient Name: William [name] MRN: [id number] DOB:Nov 12, 1950, 73 y.o., male Today's Date: 01/09/2024  PCP: Hebert Littler, MD REFERRING PROVIDER: Shirline Dover, DO  END OF SESSION:  OT End of Session - 01/09/24 0850     Visit Number 11    Number of Visits 17    Date for OT Re-Evaluation 01/17/24    Authorization Type [employer] MCR    Progress Note Due on Visit 20    OT Start Time 0850    OT Stop Time 0930    OT Time Calculation (min) 40 min    Activity Tolerance Patient tolerated treatment well    Behavior During Therapy WFL for tasks assessed/performed              Past Medical History:  Diagnosis Date   Allergic rhinitis    Anemia, iron deficiency    Arthritis    BPH associated with nocturia    ED (erectile dysfunction)    GERD (gastroesophageal reflux disease)    Hiatal hernia    History of COVID-19 11/2020   per pt moderate symptoms that resovled   History of hypertension    followed by pcp  (per pt resolved no medication since approx 2017)   History of kidney stones    Left ureteral calculus    Parkinson disease [facility])    neurologist--- dr tat;   mild jaw dyskinesia,   gait disorder, tremors   Peyronie's disease    PONV (postoperative nausea and vomiting)    S/P dilatation of esophageal stricture    Trigeminal neuropathy    left side   Ulcerative colitis    followed by dr Elvin Hammer (GI)//   1992 s/p  total abdominal colectomy with ileoanal anastomosis   Wears glasses    Past Surgical History:  Procedure Laterality Date   CRANIECTOMY Left 09/16/2022   Procedure: RETROSIGMOID CRANI FOR MICROVASCULAR DECOMPRESSION;  Surgeon: Pincus Bridgeman, DO;  Location: [facility] OR;  Service: Neurosurgery;  Laterality: Left;   CYSTOSCOPY W/ URETERAL STENT PLACEMENT  2012   for ureteral stricture   CYSTOSCOPY WITH RETROGRADE PYELOGRAM, URETEROSCOPY AND STENT PLACEMENT Left 12/31/2021   Procedure: CYSTOSCOPY WITH RETROGRADE  PYELOGRAM, FLUOROSCOPIC INTERPRETATION, URETEROSCOPY  AND STENT PLACEMENT;  Surgeon: Trent Frizzle, MD;  Location: [facility];  Service: Urology;  Laterality: Left;   CYSTOSCOPY/URETEROSCOPY/HOLMIUM LASER/STENT PLACEMENT  10/22/2005   @[facility] ;   w/ left ureteral balloon dilatation   HOLMIUM LASER APPLICATION Left 12/31/2021   Procedure: HOLMIUM LASER APPLICATION;  Surgeon: Trent Frizzle, MD;  Location: [facility];  Service: Urology;  Laterality: Left;   INGUINAL HERNIA REPAIR     per pt unilateral  approxl 1962   KNEE ARTHROSCOPY Right 02/21/2002   @[facility]    LUMBAR LAMINECTOMY/DECOMPRESSION MICRODISCECTOMY N/A 06/28/2022   Procedure: L4-5 decompression with removal of synovial cyst;  Surgeon: Mort Ards, MD;  Location: [facility] OR;  Service: Orthopedics;  Laterality: N/A;  2.5 hrs 3 C-Bed   NASAL SINUS SURGERY     x2  in 2004   RECONSTRUCTION TENDON PULLEY HAND Right 05/03/2007   @[facility] ;  right long finger   TOTAL COLECTOMY  1992   W/  ILIEOANAL ANASTOMOSIS (for ilieoanal anatomatic stricture , partial bowel obstruction and required peroidic pneumatic dilatation's)   VARICOCELECTOMY     Patient Active Problem List   Diagnosis Date Noted   Pain in finger of right hand 07/13/2023   Acute bacterial bronchitis 12/23/2022   Trigeminal  neuralgia 09/16/2022   Synovial cyst of lumbar facet joint 06/28/2022   Degeneration of lumbar intervertebral disc 01/06/2022   Low back pain 12/22/2021   Trigeminal neuralgia of left side of face 10/26/2021   Somatic dysfunction of right sacroiliac joint 02/18/2020   Cough 12/09/2019   Hypertensive disorder 07/23/2019   Lumbosacral spondylosis without myelopathy 05/01/2019   URI (upper respiratory infection) 01/20/2019   Pain in right knee 02/15/2018   Chest pain 05/28/2017   Hyperlipidemia 05/28/2017   Bradycardia 05/28/2017   Chest pain, atypical 05/27/2017   Lightheadedness 05/27/2017   Finger laceration 04/04/2017    Parkinson's disease (HCC) 02/26/2016   Eustachian tube dysfunction 02/06/2016   Hyponatremia 01/26/2016   Tremor 11/12/2015   Visual-spatial impairment 11/12/2015   Stress at work 10/24/2014   Nonspecific abnormal electrocardiogram (ECG) (EKG) 06/18/2014   S/P colectomy 06/18/2014   Well adult exam 06/18/2014   Renal stones 06/18/2014   Elevated BP 06/18/2014   Headache 04/25/2014   Essential hypertension, benign 04/25/2014   Serous otitis media 03/21/2014   Travel advice encounter 04/17/2013   Calcium nephrolithiasis 10/04/2012   Routine health maintenance 10/04/2012   Acute left flank pain 08/03/2011   Unspecified intestinal obstruction 12/03/2008   Other diseases of nasal cavity and sinuses(478.19) 05/30/2008   GERD 02/26/2008   PEPTIC STRICTURE 02/22/2008   HIATAL HERNIA 02/22/2008   Chest tightness or pressure 01/09/2008   ERECTILE DYSFUNCTION 04/07/2007   Allergic rhinitis 04/07/2007   COLITIS, ULCERATIVE NOS 04/07/2007   GASTRIC POLYP, HX OF 04/07/2007    ONSET DATE: 10/11/2023  REFERRING DIAG: R27.9 (ICD-10-CM) - Discoordination R47.9 (ICD-10-CM) - Difficulty with speech Parkinson's disease with dyskinesia and fluctuating manifestations (HCC)  THERAPY DIAG:  Other lack of coordination  Muscle weakness (generalized)  Unsteadiness on feet  Abnormal posture  Other symptoms and signs involving the nervous system  Rationale for Evaluation and Treatment: Rehabilitation  SUBJECTIVE:   SUBJECTIVE STATEMENT:  I drove back from [place] [facility] [place] [facility] yesterday so my lower back Lt side is hurting 3-4/10   Pt accompanied by: self  PERTINENT HISTORY: PD (diagnosed 2017), [facility], trigeminal neuralgia, LBP   PRECAUTIONS: None  WEIGHT BEARING RESTRICTIONS: No  PAIN:  Are you having pain? Today lower back 3-4/10  WEIGHT BEARING RESTRICTIONS: No   FALLS: Has patient fallen in last 6 months? No   LIVING ENVIRONMENT: Lives with: lives with their spouse Lives in: House  (2 story) Stairs: Yes: Internal: 13 steps; on right going up and has a regular flight of stairs to get to the gym, but otherwise does not need to do steps  and External: 3 steps; on right going up Has following equipment at home: None   PLOF: Independent, Vocation/Vocational requirements: Retired, Special educational needs teacher for about 30 years , and Leisure: reading   PATIENT GOALS: Wants to maintain and not feel like he's going to fall. Improve overall strength    OBJECTIVE:  Note: Objective measures were completed at Evaluation unless otherwise noted.  HAND DOMINANCE: Right  ADLs: Overall ADLs: slower, increased difficulty Transfers/ambulation related to ADLs: independent Eating: independent Grooming: independent UB Dressing: having difficulty with buttons on dress shirt and jacket LB Dressing: difficulty with socks Toileting: independent Bathing: independent Tub Shower transfers: independent Equipment: none  IADLs: Shopping: independent  Light housekeeping: pt does day to day cleaning (has cleaning company come in every other week)  Meal Prep: Pt has been doing cooking for past year (d/t wife's thumb and recent surgery) Community mobility: independent Medication management: independent  Financial management: independent Handwriting: Mild micrographia (and writes in print, all caps for 30 years)  MOBILITY STATUS: Independent  POSTURE COMMENTS:  rounded shoulders (slightly)    FUNCTIONAL OUTCOME MEASURES: Fastening/unfastening 3 buttons: 21.74 Physical performance test: PPT#2 (simulated eating) 12.04 &  PPT#4 (donning/doffing jacket): 16.58 sec  COORDINATION: 9 Hole Peg test: Right: 28 sec; Left: 28 sec Box and Blocks:  Right 40 blocks, Left 41 blocks Tremors: Resting and postural and more on Rt side  UE ROM:  WFL   SENSATION: WFL   COGNITION: Overall cognitive status:  word finding difficulties  OBSERVATIONS: Bradykinesia and Postural tremors (mild)                                                                                                                     TREATMENT :   Tossing scarf in air and catching with opposite hand to encourage large amplitude movements, gross motor coordination, and dual tasking with ambulation. Progressed to same task without ambulation while performing cognitive task (naming states alphabetically) for dual tasking w/ cognitive challenge.   Stepping big to side/back for balance, while flipping cards over from one surface to another for coordination and reaching while adding up last 2 cards for cognitive component. Did 1/2 deck on Rt side, remaining half on Lt side  PWR! Twist in standing with back against wall to encourage big posture, and cues to keep back of hands against wall, opening hands big, keeping elbows extended, and for making big "clap" Encouraged pt to practice this at home with his PWR! Standing Twist  Standing at corner (facing corner) for big reaching to numbered targets ipsilateral high and low reaching bilaterally, then contralateral high and low reaching. Progressed to hitting targets in different sequences for cognitive challenge/dual tasking.  Pt then progressed to standing on compliant surface with back against wall for increased challenge to balance while reaching for/hitting numbered targets  Golf solitaire seated at table with mod cues to remember game and for problem solving. Pt finished 2 above par.     PATIENT EDUCATION: Education details: see above Person educated: Patient Education method: Explanation, Verbal cues, and Handouts Education comprehension: verbalized understanding and returned demonstration  HOME EXERCISE PROGRAM: 11/21/23: Coordination Activities 11/28/23: PWR! Hands, PWR! Seated 12/12/23: WARM Memory Compensation strategies 12/19/23: handwriting strategies, ADL strategies 12/21/23: cape method for jacket, ways to prevent future PD complications, ways to keep thinking skills  sharp 12/26/2023: PWR! All Fours; Community resources + Zoom classes 01/02/2024: PD exercise chart  GOALS: Goals reviewed with patient? Yes  SHORT TERM GOALS: Target date: 12/18/23    Pt will be independent with PD specific HEP. Baseline: not yet initiated Goal status: MET  2.  Pt will verbalize understanding of adapted strategies to maximize safety and independence with ADLs/IADLs. Baseline: not yet initiated Goal status: MET  3.  Pt will write a paragraph with no significant decrease in size and maintain 100% legibility. Baseline: 100% legibility, mild micrographia Goal status: MET  4.  Pt will  demonstrate improved fine motor coordination for ADLs as evidenced by decreasing 9 hole peg test score for both hands and by 2 secs Baseline: 28 secs both hands Goal status: MET (01/04/24: Rt = 22 sec, Lt = 24 sec)  5.  Pt will demonstrate improved ease with fastening buttons as evidenced by decreasing 3 button/unbutton time by 2 seconds Baseline: 21.74 seconds Goal status: NOT MET (01/04/24: 29 sec)   6.  Pt will be able to place at least 45 blocks using Rt and Lt hand with completion of Box and Blocks test. Baseline: Rt = 40 blocks, Lt = 41 blocks Goal status: MET (01/04/24: Rt = 51, Lt = 50)  LONG TERM GOALS: Target date: 01/17/24    Pt will verbalize understanding of ways to prevent future PD related complications and PD community resources. Baseline: not yet initiated Goal status: MET  2.  Pt will verbalize understanding of ways to keep thinking skills sharp and ways to compensate for STM changes in the future. Baseline: not yet initiated Goal status: MET  3.  Pt will demonstrate increased ease with dressing as evidenced by decreasing PPT#4 (don/ doff jacket) to 14 secs or less. Baseline: 16.58 secs Goal status: IN Progress  4.  Pt will demonstrate improved fine motor coordination for ADLs as evidenced by decreasing 9 hole peg test score for both hands and by 4 secs Baseline: 28  secs (bilaterally) Goal status: MET (01/04/24: Rt = 22 sec, Lt = 24 sec)   5.  Pt to report greater ease with donning button up shirts and donning socks Baseline:  Goal status: IN PROGRESS  ASSESSMENT:  CLINICAL IMPRESSION: Pt has met 5/6 STG's and already has met 3/5 LTG's. Pt progressing with all areas of function and reports increased awareness. Pt demo good ability to perform dual tasking w/ cognitive challenge.   PERFORMANCE DEFICITS: in functional skills including ADLs, IADLs, coordination, dexterity, ROM, strength, Fine motor control, Gross motor control, mobility, body mechanics, endurance, and UE functional use, cognitive skills including  word finding difficulties  IMPAIRMENTS: are limiting patient from ADLs, IADLs, rest and sleep, leisure, and social participation.   COMORBIDITIES:  may have co-morbidities  that affects occupational performance. Patient will benefit from skilled OT to address above impairments and improve overall function.  REHAB POTENTIAL: Good  PLAN:  OT FREQUENCY: 2x/week  OT DURATION: 8 weeks (PLUS EVAL)   PLANNED INTERVENTIONS: 97535 self care/ADL training, 16109 therapeutic exercise, 97530 therapeutic activity, 97112 neuromuscular re-education, 97140 manual therapy, 97113 aquatic therapy, 97010 moist heat, 97129 Cognitive training (first 15 min), 60454 Cognitive training(each additional 15 min), 09811 Orthotics management and training, 91478 Splinting (initial encounter), [id number] Subsequent splinting/medication, passive range of motion, functional mobility training, visual/perceptual remediation/compensation, patient/family education, and DME and/or AE instructions  RECOMMENDED OTHER SERVICES: none at this time  CONSULTED AND AGREED WITH PLAN OF CARE: Patient  PLAN FOR NEXT SESSION:  review PWR! Quadraped prn [employer] Issue new POP meeting info and encourage pt to take community ex classes upon d/c from therapy on 01/16/24    Velinda Getting,  OT 01/09/2024, 8:51 AM

## 2024-01-11 ENCOUNTER — Ambulatory Visit: Admitting: Occupational Therapy

## 2024-01-11 DIAGNOSIS — R293 Abnormal posture: Secondary | ICD-10-CM

## 2024-01-11 DIAGNOSIS — R278 Other lack of coordination: Secondary | ICD-10-CM

## 2024-01-11 DIAGNOSIS — M6281 Muscle weakness (generalized): Secondary | ICD-10-CM | POA: Diagnosis not present

## 2024-01-11 DIAGNOSIS — R2681 Unsteadiness on feet: Secondary | ICD-10-CM

## 2024-01-11 DIAGNOSIS — R29818 Other symptoms and signs involving the nervous system: Secondary | ICD-10-CM | POA: Diagnosis not present

## 2024-01-11 DIAGNOSIS — R251 Tremor, unspecified: Secondary | ICD-10-CM | POA: Diagnosis not present

## 2024-01-11 NOTE — Therapy (Signed)
 OUTPATIENT OCCUPATIONAL THERAPY [facility]  Patient Name: William [name] MRN: [id number] DOB:1951/01/05, 73 y.o., male Today's Date: 01/11/2024  PCP: Hebert Littler, MD REFERRING PROVIDER: Shirline Dover, DO  END OF SESSION:  OT End of Session - 01/11/24 0905     Visit Number 12    Number of Visits 17    Date for OT Re-Evaluation 01/17/24    Authorization Type [employer] MCR    Progress Note Due on Visit 20    OT Start Time 0847    OT Stop Time 0930    OT Time Calculation (min) 43 min    Activity Tolerance Patient tolerated treatment well    Behavior During Therapy WFL for tasks assessed/performed              Past Medical History:  Diagnosis Date   Allergic rhinitis    Anemia, iron deficiency    Arthritis    BPH associated with nocturia    ED (erectile dysfunction)    GERD (gastroesophageal reflux disease)    Hiatal hernia    History of COVID-19 11/2020   per pt moderate symptoms that resovled   History of hypertension    followed by pcp  (per pt resolved no medication since approx 2017)   History of kidney stones    Left ureteral calculus    Parkinson disease [facility])    neurologist--- dr tat;   mild jaw dyskinesia,   gait disorder, tremors   Peyronie's disease    PONV (postoperative nausea and vomiting)    S/P dilatation of esophageal stricture    Trigeminal neuropathy    left side   Ulcerative colitis    followed by dr Elvin Hammer (GI)//   1992 s/p  total abdominal colectomy with ileoanal anastomosis   Wears glasses    Past Surgical History:  Procedure Laterality Date   CRANIECTOMY Left 09/16/2022   Procedure: RETROSIGMOID CRANI FOR MICROVASCULAR DECOMPRESSION;  Surgeon: Pincus Bridgeman, DO;  Location: [facility] OR;  Service: Neurosurgery;  Laterality: Left;   CYSTOSCOPY W/ URETERAL STENT PLACEMENT  2012   for ureteral stricture   CYSTOSCOPY WITH RETROGRADE PYELOGRAM, URETEROSCOPY AND STENT PLACEMENT Left 12/31/2021   Procedure: CYSTOSCOPY WITH RETROGRADE  PYELOGRAM, FLUOROSCOPIC INTERPRETATION, URETEROSCOPY  AND STENT PLACEMENT;  Surgeon: Trent Frizzle, MD;  Location: [facility];  Service: Urology;  Laterality: Left;   CYSTOSCOPY/URETEROSCOPY/HOLMIUM LASER/STENT PLACEMENT  10/22/2005   @[facility] ;   w/ left ureteral balloon dilatation   HOLMIUM LASER APPLICATION Left 12/31/2021   Procedure: HOLMIUM LASER APPLICATION;  Surgeon: Trent Frizzle, MD;  Location: [facility];  Service: Urology;  Laterality: Left;   INGUINAL HERNIA REPAIR     per pt unilateral  approxl 1962   KNEE ARTHROSCOPY Right 02/21/2002   @[facility]    LUMBAR LAMINECTOMY/DECOMPRESSION MICRODISCECTOMY N/A 06/28/2022   Procedure: L4-5 decompression with removal of synovial cyst;  Surgeon: Mort Ards, MD;  Location: [facility] OR;  Service: Orthopedics;  Laterality: N/A;  2.5 hrs 3 C-Bed   NASAL SINUS SURGERY     x2  in 2004   RECONSTRUCTION TENDON PULLEY HAND Right 05/03/2007   @[facility] ;  right long finger   TOTAL COLECTOMY  1992   W/  ILIEOANAL ANASTOMOSIS (for ilieoanal anatomatic stricture , partial bowel obstruction and required peroidic pneumatic dilatation's)   VARICOCELECTOMY     Patient Active Problem List   Diagnosis Date Noted   Pain in finger of right hand 07/13/2023   Acute bacterial bronchitis 12/23/2022   Trigeminal  neuralgia 09/16/2022   Synovial cyst of lumbar facet joint 06/28/2022   Degeneration of lumbar intervertebral disc 01/06/2022   Low back pain 12/22/2021   Trigeminal neuralgia of left side of face 10/26/2021   Somatic dysfunction of right sacroiliac joint 02/18/2020   Cough 12/09/2019   Hypertensive disorder 07/23/2019   Lumbosacral spondylosis without myelopathy 05/01/2019   URI (upper respiratory infection) 01/20/2019   Pain in right knee 02/15/2018   Chest pain 05/28/2017   Hyperlipidemia 05/28/2017   Bradycardia 05/28/2017   Chest pain, atypical 05/27/2017   Lightheadedness 05/27/2017   Finger laceration 04/04/2017    Parkinson's disease (HCC) 02/26/2016   Eustachian tube dysfunction 02/06/2016   Hyponatremia 01/26/2016   Tremor 11/12/2015   Visual-spatial impairment 11/12/2015   Stress at work 10/24/2014   Nonspecific abnormal electrocardiogram (ECG) (EKG) 06/18/2014   S/P colectomy 06/18/2014   Well adult exam 06/18/2014   Renal stones 06/18/2014   Elevated BP 06/18/2014   Headache 04/25/2014   Essential hypertension, benign 04/25/2014   Serous otitis media 03/21/2014   Travel advice encounter 04/17/2013   Calcium nephrolithiasis 10/04/2012   Routine health maintenance 10/04/2012   Acute left flank pain 08/03/2011   Unspecified intestinal obstruction 12/03/2008   Other diseases of nasal cavity and sinuses(478.19) 05/30/2008   GERD 02/26/2008   PEPTIC STRICTURE 02/22/2008   HIATAL HERNIA 02/22/2008   Chest tightness or pressure 01/09/2008   ERECTILE DYSFUNCTION 04/07/2007   Allergic rhinitis 04/07/2007   COLITIS, ULCERATIVE NOS 04/07/2007   GASTRIC POLYP, HX OF 04/07/2007    ONSET DATE: 10/11/2023  REFERRING DIAG: R27.9 (ICD-10-CM) - Discoordination R47.9 (ICD-10-CM) - Difficulty with speech Parkinson's disease with dyskinesia and fluctuating manifestations (HCC)  THERAPY DIAG:  Other lack of coordination  Muscle weakness (generalized)  Unsteadiness on feet  Abnormal posture  Other symptoms and signs involving the nervous system  Rationale for Evaluation and Treatment: Rehabilitation  SUBJECTIVE:   SUBJECTIVE STATEMENT:  No pain today.    Pt accompanied by: self  PERTINENT HISTORY: PD (diagnosed 2017), [facility], trigeminal neuralgia, LBP   PRECAUTIONS: None  WEIGHT BEARING RESTRICTIONS: No  PAIN:  Are you having pain? Today lower back 3-4/10  WEIGHT BEARING RESTRICTIONS: No   FALLS: Has patient fallen in last 6 months? No   LIVING ENVIRONMENT: Lives with: lives with their spouse Lives in: House (2 story) Stairs: Yes: Internal: 13 steps; on right going up and  has a regular flight of stairs to get to the gym, but otherwise does not need to do steps  and External: 3 steps; on right going up Has following equipment at home: None   PLOF: Independent, Vocation/Vocational requirements: Retired, Special educational needs teacher for about 30 years , and Leisure: reading   PATIENT GOALS: Wants to maintain and not feel like he's going to fall. Improve overall strength    OBJECTIVE:  Note: Objective measures were completed at Evaluation unless otherwise noted.  HAND DOMINANCE: Right  ADLs: Overall ADLs: slower, increased difficulty Transfers/ambulation related to ADLs: independent Eating: independent Grooming: independent UB Dressing: having difficulty with buttons on dress shirt and jacket LB Dressing: difficulty with socks Toileting: independent Bathing: independent Tub Shower transfers: independent Equipment: none  IADLs: Shopping: independent  Light housekeeping: pt does day to day cleaning (has cleaning company come in every other week)  Meal Prep: Pt has been doing cooking for past year (d/t wife's thumb and recent surgery) Community mobility: independent Medication management: independent Financial management: independent Handwriting: Mild micrographia (and writes in print, all caps  for 30 years)  MOBILITY STATUS: Independent  POSTURE COMMENTS:  rounded shoulders (slightly)    FUNCTIONAL OUTCOME MEASURES: Fastening/unfastening 3 buttons: 21.74 Physical performance test: PPT#2 (simulated eating) 12.04 &  PPT#4 (donning/doffing jacket): 16.58 sec  COORDINATION: 9 Hole Peg test: Right: 28 sec; Left: 28 sec Box and Blocks:  Right 40 blocks, Left 41 blocks Tremors: Resting and postural and more on Rt side  UE ROM:  WFL   SENSATION: WFL   COGNITION: Overall cognitive status:  word finding difficulties  OBSERVATIONS: Bradykinesia and Postural tremors (mild)                                                                                                                     TREATMENT :   Standing using 5 blaze pods for balance, LE wt shifts, big reach, visual scanning, and targeted reach/hit w/ open power hand using O.T. distracting and scanning setting with 3 distracting colors and 1 targeted color - 3 cycles of 1 minute each, 15 sec b/t cycles and 2 rounds.  First round: 1- 24 hits w/ delayed scanning and insufficient hit with hand          2 - 30 hits w/ slightly improved scanning and power hit w/ hand          3 - 32 hits w/ improvements (Cued to scan quicker and hit with more power prior to second round)  Second round: 1 - 36 hits w/ much improvement in visual scanning and power hit    2 - 35 hits   3 - 33 hits (slightly declined d/t fatigue)   Reviewed PWR! All 4's (basic 4) - pt shown modifications prn for PWR! Step but he did not need. Added scarf activity with PWR! Up and PWR! Twist to increase dual tasking and encourage/increase amplitude behind movements   UBE x 5 mintues, level 3 for UE strength/endurance keeping RPM > 40   Encouraged pt to register for PWR! Class at [facility] as pt is close to d/c with O.T. Also made copy of updated PD education class and PWR! Zoom class however pt walked out forgetting it therefore will issue at next session     PATIENT EDUCATION: Education details: see above Person educated: Patient Education method: Explanation, Verbal cues, and Handouts Education comprehension: verbalized understanding and returned demonstration  HOME EXERCISE PROGRAM: 11/21/23: Coordination Activities 11/28/23: PWR! Hands, PWR! Seated 12/12/23: WARM Memory Compensation strategies 12/19/23: handwriting strategies, ADL strategies 12/21/23: cape method for jacket, ways to prevent future PD complications, ways to keep thinking skills sharp 12/26/2023: PWR! All Fours; Community resources + Zoom classes 01/02/2024: PD exercise chart  GOALS: Goals reviewed with patient? Yes  SHORT TERM GOALS: Target date:  12/18/23    Pt will be independent with PD specific HEP. Baseline: not yet initiated Goal status: MET  2.  Pt will verbalize understanding of adapted strategies to maximize safety and independence with ADLs/IADLs. Baseline: not yet initiated Goal status: MET  3.  Pt will  write a paragraph with no significant decrease in size and maintain 100% legibility. Baseline: 100% legibility, mild micrographia Goal status: MET  4.  Pt will demonstrate improved fine motor coordination for ADLs as evidenced by decreasing 9 hole peg test score for both hands and by 2 secs Baseline: 28 secs both hands Goal status: MET (01/04/24: Rt = 22 sec, Lt = 24 sec)  5.  Pt will demonstrate improved ease with fastening buttons as evidenced by decreasing 3 button/unbutton time by 2 seconds Baseline: 21.74 seconds Goal status: NOT MET (01/04/24: 29 sec)   6.  Pt will be able to place at least 45 blocks using Rt and Lt hand with completion of Box and Blocks test. Baseline: Rt = 40 blocks, Lt = 41 blocks Goal status: MET (01/04/24: Rt = 51, Lt = 50)  LONG TERM GOALS: Target date: 01/17/24    Pt will verbalize understanding of ways to prevent future PD related complications and PD community resources. Baseline: not yet initiated Goal status: MET  2.  Pt will verbalize understanding of ways to keep thinking skills sharp and ways to compensate for STM changes in the future. Baseline: not yet initiated Goal status: MET  3.  Pt will demonstrate increased ease with dressing as evidenced by decreasing PPT#4 (don/ doff jacket) to 14 secs or less. Baseline: 16.58 secs Goal status: IN Progress  4.  Pt will demonstrate improved fine motor coordination for ADLs as evidenced by decreasing 9 hole peg test score for both hands and by 4 secs Baseline: 28 secs (bilaterally) Goal status: MET (01/04/24: Rt = 22 sec, Lt = 24 sec)   5.  Pt to report greater ease with donning button up shirts and donning socks Baseline:  Goal  status: IN PROGRESS  ASSESSMENT:  CLINICAL IMPRESSION: Pt has met 5/6 STG's and already has met 3/5 LTG's. Pt progressing with all areas of function and reports increased awareness. Pt demo good ability to perform dual tasking w/ cognitive challenge.   PERFORMANCE DEFICITS: in functional skills including ADLs, IADLs, coordination, dexterity, ROM, strength, Fine motor control, Gross motor control, mobility, body mechanics, endurance, and UE functional use, cognitive skills including  word finding difficulties  IMPAIRMENTS: are limiting patient from ADLs, IADLs, rest and sleep, leisure, and social participation.   COMORBIDITIES:  may have co-morbidities  that affects occupational performance. Patient will benefit from skilled OT to address above impairments and improve overall function.  REHAB POTENTIAL: Good  PLAN:  OT FREQUENCY: 2x/week  OT DURATION: 8 weeks (PLUS EVAL)   PLANNED INTERVENTIONS: 97535 self care/ADL training, 52841 therapeutic exercise, 97530 therapeutic activity, 97112 neuromuscular re-education, 97140 manual therapy, 97113 aquatic therapy, 97010 moist heat, 97129 Cognitive training (first 15 min), 32440 Cognitive training(each additional 15 min), 10272 Orthotics management and training, 53664 Splinting (initial encounter), [id number] Subsequent splinting/medication, passive range of motion, functional mobility training, visual/perceptual remediation/compensation, patient/family education, and DME and/or AE instructions  RECOMMENDED OTHER SERVICES: none at this time  CONSULTED AND AGREED WITH PLAN OF CARE: Patient  PLAN FOR NEXT SESSION:  Check remaining goals, issue copies of community items (he left at clinic this session),  D/C - Pt already has PD screens scheduled in Oct 2025    Velinda Getting, OT 01/11/2024, 9:06 AM

## 2024-01-16 ENCOUNTER — Encounter: Payer: Self-pay | Admitting: Occupational Therapy

## 2024-01-16 ENCOUNTER — Ambulatory Visit: Attending: Neurology | Admitting: Occupational Therapy

## 2024-01-16 DIAGNOSIS — R29818 Other symptoms and signs involving the nervous system: Secondary | ICD-10-CM | POA: Diagnosis not present

## 2024-01-16 DIAGNOSIS — M6281 Muscle weakness (generalized): Secondary | ICD-10-CM | POA: Insufficient documentation

## 2024-01-16 DIAGNOSIS — R293 Abnormal posture: Secondary | ICD-10-CM | POA: Diagnosis not present

## 2024-01-16 DIAGNOSIS — R278 Other lack of coordination: Secondary | ICD-10-CM | POA: Diagnosis not present

## 2024-01-16 DIAGNOSIS — R2681 Unsteadiness on feet: Secondary | ICD-10-CM | POA: Insufficient documentation

## 2024-01-16 NOTE — Therapy (Signed)
 OUTPATIENT OCCUPATIONAL THERAPY PARKINSON'S TREATMENT/DISCHARGE  Patient Name: William [name] MRN: [id number] DOB:1950-10-02, 73 y.o., male Today's Date: 01/16/2024  PCP: Hebert Littler, MD REFERRING PROVIDER: Tat, Von Grumbling, DO   OCCUPATIONAL THERAPY DISCHARGE SUMMARY  Visits from Start of Care: 13  Current functional level related to goals / functional outcomes: See below   Remaining deficits: Mild hypokinesia and bradykinesia Mild tremor   Education / Equipment: See below education and HEP section for extensive education and HEP's   Patient agrees to discharge. Patient goals were met. Patient is being discharged due to meeting the stated rehab goals.. Pt doing very well.  Scheduled for PD screens in Oct 2025     END OF SESSION:  OT End of Session - 01/16/24 0851     Visit Number 13    Number of Visits 17    Date for OT Re-Evaluation 01/17/24    Authorization Type [employer] MCR    Progress Note Due on Visit 20    OT Start Time 0848    OT Stop Time 0930    OT Time Calculation (min) 42 min    Activity Tolerance Patient tolerated treatment well    Behavior During Therapy WFL for tasks assessed/performed              Past Medical History:  Diagnosis Date   Allergic rhinitis    Anemia, iron deficiency    Arthritis    BPH associated with nocturia    ED (erectile dysfunction)    GERD (gastroesophageal reflux disease)    Hiatal hernia    History of COVID-19 11/2020   per pt moderate symptoms that resovled   History of hypertension    followed by pcp  (per pt resolved no medication since approx 2017)   History of kidney stones    Left ureteral calculus    Parkinson disease [facility])    neurologist--- dr tat;   mild jaw dyskinesia,   gait disorder, tremors   Peyronie's disease    PONV (postoperative nausea and vomiting)    S/P dilatation of esophageal stricture    Trigeminal neuropathy    left side   Ulcerative colitis    followed by dr Elvin Hammer (GI)//    1992 s/p  total abdominal colectomy with ileoanal anastomosis   Wears glasses    Past Surgical History:  Procedure Laterality Date   CRANIECTOMY Left 09/16/2022   Procedure: RETROSIGMOID CRANI FOR MICROVASCULAR DECOMPRESSION;  Surgeon: Pincus Bridgeman, DO;  Location: [facility] OR;  Service: Neurosurgery;  Laterality: Left;   CYSTOSCOPY W/ URETERAL STENT PLACEMENT  2012   for ureteral stricture   CYSTOSCOPY WITH RETROGRADE PYELOGRAM, URETEROSCOPY AND STENT PLACEMENT Left 12/31/2021   Procedure: CYSTOSCOPY WITH RETROGRADE PYELOGRAM, FLUOROSCOPIC INTERPRETATION, URETEROSCOPY  AND STENT PLACEMENT;  Surgeon: Trent Frizzle, MD;  Location: [facility];  Service: Urology;  Laterality: Left;   CYSTOSCOPY/URETEROSCOPY/HOLMIUM LASER/STENT PLACEMENT  10/22/2005   @[facility] ;   w/ left ureteral balloon dilatation   HOLMIUM LASER APPLICATION Left 12/31/2021   Procedure: HOLMIUM LASER APPLICATION;  Surgeon: Trent Frizzle, MD;  Location: [facility];  Service: Urology;  Laterality: Left;   INGUINAL HERNIA REPAIR     per pt unilateral  approxl 1962   KNEE ARTHROSCOPY Right 02/21/2002   @[facility]    LUMBAR LAMINECTOMY/DECOMPRESSION MICRODISCECTOMY N/A 06/28/2022   Procedure: L4-5 decompression with removal of synovial cyst;  Surgeon: Mort Ards, MD;  Location: [facility] OR;  Service: Orthopedics;  Laterality: N/A;  2.5 hrs 3 C-Bed  NASAL SINUS SURGERY     x2  in 2004   RECONSTRUCTION TENDON PULLEY HAND Right 05/03/2007   @[facility] ;  right long finger   TOTAL COLECTOMY  1992   W/  ILIEOANAL ANASTOMOSIS (for ilieoanal anatomatic stricture , partial bowel obstruction and required peroidic pneumatic dilatation's)   VARICOCELECTOMY     Patient Active Problem List   Diagnosis Date Noted   Pain in finger of right hand 07/13/2023   Acute bacterial bronchitis 12/23/2022   Trigeminal neuralgia 09/16/2022   Synovial cyst of lumbar facet joint 06/28/2022   Degeneration of lumbar intervertebral disc  01/06/2022   Low back pain 12/22/2021   Trigeminal neuralgia of left side of face 10/26/2021   Somatic dysfunction of right sacroiliac joint 02/18/2020   Cough 12/09/2019   Hypertensive disorder 07/23/2019   Lumbosacral spondylosis without myelopathy 05/01/2019   URI (upper respiratory infection) 01/20/2019   Pain in right knee 02/15/2018   Chest pain 05/28/2017   Hyperlipidemia 05/28/2017   Bradycardia 05/28/2017   Chest pain, atypical 05/27/2017   Lightheadedness 05/27/2017   Finger laceration 04/04/2017   Parkinson's disease (HCC) 02/26/2016   Eustachian tube dysfunction 02/06/2016   Hyponatremia 01/26/2016   Tremor 11/12/2015   Visual-spatial impairment 11/12/2015   Stress at work 10/24/2014   Nonspecific abnormal electrocardiogram (ECG) (EKG) 06/18/2014   S/P colectomy 06/18/2014   Well adult exam 06/18/2014   Renal stones 06/18/2014   Elevated BP 06/18/2014   Headache 04/25/2014   Essential hypertension, benign 04/25/2014   Serous otitis media 03/21/2014   Travel advice encounter 04/17/2013   Calcium nephrolithiasis 10/04/2012   Routine health maintenance 10/04/2012   Acute left flank pain 08/03/2011   Unspecified intestinal obstruction 12/03/2008   Other diseases of nasal cavity and sinuses(478.19) 05/30/2008   GERD 02/26/2008   PEPTIC STRICTURE 02/22/2008   HIATAL HERNIA 02/22/2008   Chest tightness or pressure 01/09/2008   ERECTILE DYSFUNCTION 04/07/2007   Allergic rhinitis 04/07/2007   COLITIS, ULCERATIVE NOS 04/07/2007   GASTRIC POLYP, HX OF 04/07/2007    ONSET DATE: 10/11/2023  REFERRING DIAG: R27.9 (ICD-10-CM) - Discoordination R47.9 (ICD-10-CM) - Difficulty with speech Parkinson's disease with dyskinesia and fluctuating manifestations (HCC)  THERAPY DIAG:  Other lack of coordination  Muscle weakness (generalized)  Unsteadiness on feet  Abnormal posture  Other symptoms and signs involving the nervous system  Rationale for Evaluation and  Treatment: Rehabilitation  SUBJECTIVE:   SUBJECTIVE STATEMENT:  No pain today. No new falls   Pt accompanied by: self  PERTINENT HISTORY: PD (diagnosed 2017), MCI, trigeminal neuralgia, LBP   PRECAUTIONS: None  WEIGHT BEARING RESTRICTIONS: No  PAIN:  Are you having pain? Today lower back 3-4/10  WEIGHT BEARING RESTRICTIONS: No   FALLS: Has patient fallen in last 6 months? No   LIVING ENVIRONMENT: Lives with: lives with their spouse Lives in: House (2 story) Stairs: Yes: Internal: 13 steps; on right going up and has a regular flight of stairs to get to the gym, but otherwise does not need to do steps  and External: 3 steps; on right going up Has following equipment at home: None   PLOF: Independent, Vocation/Vocational requirements: Retired, Special educational needs teacher for about 30 years , and Leisure: reading   PATIENT GOALS: Wants to maintain and not feel like he's going to fall. Improve overall strength    OBJECTIVE:  Note: Objective measures were completed at Evaluation unless otherwise noted.  HAND DOMINANCE: Right  ADLs: Overall ADLs: slower, increased difficulty Transfers/ambulation related to ADLs:  independent Eating: independent Grooming: independent UB Dressing: having difficulty with buttons on dress shirt and jacket LB Dressing: difficulty with socks Toileting: independent Bathing: independent Tub Shower transfers: independent Equipment: none  IADLs: Shopping: independent  Light housekeeping: pt does day to day cleaning (has cleaning company come in every other week)  Meal Prep: Pt has been doing cooking for past year (d/t wife's thumb and recent surgery) Community mobility: independent Medication management: independent Landscape architect: independent Handwriting: Mild micrographia (and writes in print, all caps for 30 years)  MOBILITY STATUS: Independent  POSTURE COMMENTS:  rounded shoulders (slightly)    FUNCTIONAL OUTCOME  MEASURES: Fastening/unfastening 3 buttons: 21.74 Physical performance test: PPT#2 (simulated eating) 12.04 &  PPT#4 (donning/doffing jacket): 16.58 sec  COORDINATION: 9 Hole Peg test: Right: 28 sec; Left: 28 sec Box and Blocks:  Right 40 blocks, Left 41 blocks Tremors: Resting and postural and more on Rt side  UE ROM:  WFL   SENSATION: WFL   COGNITION: Overall cognitive status:  word finding difficulties  OBSERVATIONS: Bradykinesia and Postural tremors (mild)                                                                                                                    TREATMENT :   Reviewed progress towards goals and assessed remaining goals - see below.   Reviewed ADL strategies and ways to prevent future PD related complications  Standing on compliant surface and placing small pegs in pegboard on vertical surface following peg design for: balance, wt shifts, high level reaching in diagonal patterns, fine motor coordination, and visual/perceptual challenge.   UBE x 7 mintues, level 3 for UE strength/endurance keeping RPM > 40 with 1 rest break required.   PATIENT EDUCATION: Education details: see above Person educated: Patient Education method: Explanation, Verbal cues, and Handouts Education comprehension: verbalized understanding and returned demonstration  HOME EXERCISE PROGRAM: 11/21/23: Coordination Activities 11/28/23: PWR! Hands, PWR! Seated 12/12/23: WARM Memory Compensation strategies 12/19/23: handwriting strategies, ADL strategies 12/21/23: cape method for jacket, ways to prevent future PD complications, ways to keep thinking skills sharp 12/26/2023: PWR! All Fours; Community resources + Zoom classes 01/02/2024: PD exercise chart  GOALS: Goals reviewed with patient? Yes  SHORT TERM GOALS: Target date: 12/18/23    Pt will be independent with PD specific HEP. Baseline: not yet initiated Goal status: MET  2.  Pt will verbalize understanding of adapted  strategies to maximize safety and independence with ADLs/IADLs. Baseline: not yet initiated Goal status: MET  3.  Pt will write a paragraph with no significant decrease in size and maintain 100% legibility. Baseline: 100% legibility, mild micrographia Goal status: MET  4.  Pt will demonstrate improved fine motor coordination for ADLs as evidenced by decreasing 9 hole peg test score for both hands and by 2 secs Baseline: 28 secs both hands Goal status: MET (01/04/24: Rt = 22 sec, Lt = 24 sec)  5.  Pt will demonstrate improved ease with fastening buttons as evidenced by decreasing 3  button/unbutton time by 2 seconds Baseline: 21.74 seconds Goal status: NOT MET (01/04/24: 29 sec)   6.  Pt will be able to place at least 45 blocks using Rt and Lt hand with completion of Box and Blocks test. Baseline: Rt = 40 blocks, Lt = 41 blocks Goal status: MET (01/04/24: Rt = 51, Lt = 50)  LONG TERM GOALS: Target date: 01/17/24    Pt will verbalize understanding of ways to prevent future PD related complications and PD community resources. Baseline: not yet initiated Goal status: MET  2.  Pt will verbalize understanding of ways to keep thinking skills sharp and ways to compensate for STM changes in the future. Baseline: not yet initiated Goal status: MET  3.  Pt will demonstrate increased ease with dressing as evidenced by decreasing PPT#4 (don/ doff jacket) to 14 secs or less. Baseline: 16.58 secs Goal status: MET (01/16/24 = 7 sec)   4.  Pt will demonstrate improved fine motor coordination for ADLs as evidenced by decreasing 9 hole peg test score for both hands and by 4 secs Baseline: 28 secs (bilaterally) Goal status: MET (01/04/24: Rt = 22 sec, Lt = 24 sec)   5.  Pt to report greater ease with donning button up shirts and donning socks Baseline:  Goal status: MET   ASSESSMENT:  CLINICAL IMPRESSION: Pt has met 5/6 STG's and all  LTG's. Pt progressing with all areas of function and reports  increased awareness. Pt demo good ability to perform dual tasking w/ cognitive challenge.   PERFORMANCE DEFICITS: in functional skills including ADLs, IADLs, coordination, dexterity, ROM, strength, Fine motor control, Gross motor control, mobility, body mechanics, endurance, and UE functional use, cognitive skills including  word finding difficulties  IMPAIRMENTS: are limiting patient from ADLs, IADLs, rest and sleep, leisure, and social participation.   COMORBIDITIES:  may have co-morbidities  that affects occupational performance. Patient will benefit from skilled OT to address above impairments and improve overall function.  REHAB POTENTIAL: Good  PLAN:  OT FREQUENCY: 2x/week  OT DURATION: 8 weeks (PLUS EVAL)   PLANNED INTERVENTIONS: 97535 self care/ADL training, 84696 therapeutic exercise, 97530 therapeutic activity, 97112 neuromuscular re-education, 97140 manual therapy, 97113 aquatic therapy, 97010 moist heat, 97129 Cognitive training (first 15 min), 29528 Cognitive training(each additional 15 min), 41324 Orthotics management and training, 40102 Splinting (initial encounter), [id number] Subsequent splinting/medication, passive range of motion, functional mobility training, visual/perceptual remediation/compensation, patient/family education, and DME and/or AE instructions  RECOMMENDED OTHER SERVICES: none at this time  CONSULTED AND AGREED WITH PLAN OF CARE: Patient  PLAN  D/C O.T.    Velinda Getting, OT 01/16/2024, 8:51 AM

## 2024-02-20 DIAGNOSIS — Z6825 Body mass index (BMI) 25.0-25.9, adult: Secondary | ICD-10-CM | POA: Diagnosis not present

## 2024-02-20 DIAGNOSIS — G500 Trigeminal neuralgia: Secondary | ICD-10-CM | POA: Diagnosis not present

## 2024-03-12 ENCOUNTER — Other Ambulatory Visit: Payer: Self-pay | Admitting: Neurology

## 2024-03-12 DIAGNOSIS — G20B2 Parkinson's disease with dyskinesia, with fluctuations: Secondary | ICD-10-CM

## 2024-03-14 DIAGNOSIS — L720 Epidermal cyst: Secondary | ICD-10-CM | POA: Diagnosis not present

## 2024-03-14 DIAGNOSIS — L255 Unspecified contact dermatitis due to plants, except food: Secondary | ICD-10-CM | POA: Diagnosis not present

## 2024-03-15 DIAGNOSIS — H2513 Age-related nuclear cataract, bilateral: Secondary | ICD-10-CM | POA: Diagnosis not present

## 2024-03-15 DIAGNOSIS — Z0100 Encounter for examination of eyes and vision without abnormal findings: Secondary | ICD-10-CM | POA: Diagnosis not present

## 2024-03-15 DIAGNOSIS — H1789 Other corneal scars and opacities: Secondary | ICD-10-CM | POA: Diagnosis not present

## 2024-03-19 ENCOUNTER — Other Ambulatory Visit: Payer: Self-pay | Admitting: Neurology

## 2024-03-19 DIAGNOSIS — G20B1 Parkinson's disease with dyskinesia, without mention of fluctuations: Secondary | ICD-10-CM

## 2024-03-19 DIAGNOSIS — G20A1 Parkinson's disease without dyskinesia, without mention of fluctuations: Secondary | ICD-10-CM

## 2024-04-10 NOTE — Progress Notes (Unsigned)
 Assessment/Plan:   1.  Parkinsons Disease  -continue carbidopa /levodopa  25/100, 2 tablets at 7 AM, 2 tablets at 11 AM, 2 tablets at 4 PM.  -Continue ropinirole , 3 mg 3 times per day.    -continue amantadine  100 mg tid  -We discussed Vyalev, which is foscarbidopa/foslevodopa pump that is newly FDA approved.  We discussed that it is for motor fluctuations in adults with advanced Parkinson's disease.  We discussed that this likely will not be on Medicare formulary until the latter half of 2025.  We discussed risks and benefits of this drug.   He isn't interested right now.   2.  MCI  -Last neurocognitive testing in 2019.  I do not think anything clinically has significantly changed since that time.  3.  Trigeminal neuralgia  -Patient underwent left microvascular decompression of the trigeminal nerve on September 16, 2022 with Dr. Carollee.  Now following with Dr. Gillie.  -Patient with hyponatremia with carbamazepine   4.  LBP  -Follows with [facility] Subjective:   William [name] was seen today in follow up for Parkinsons disease.  My previous records were reviewed prior to todays visit as well as outside records available to me.  Patient has been doing fairly well since our last visit.  He has had no falls.  No lightheadedness or near syncope.  He remains on levodopa , ropinirole  and amantadine .  He has not had sleep attacks, compulsive behaviors or hallucinations.  He was in occupational therapy since our last visit.  Those notes are reviewed.  He saw Dr. Gillie June 9, but unfortunately I did not get notes.  Current prescribed movement disorder medications: Carbidopa /levodopa  25/100,2 tablets at 7 AM, 2 tablets at 11 AM, 2 tablets at 4 PM.   Ropinirole , 3 mg 3 times per day Amantadine , 100 mg 3 times per day (added last visit)  Prior meds:  pramipexole  er (switched due to a near syncopal episode after drink of alcohol); Lyrica  (tolerated fine but when off when had neuralgia surgery);  carbamazepine  (hyponatremia); rotigotine  (helped, but caused rash and was expensive)   ALLERGIES:  No Known Allergies  CURRENT MEDICATIONS:  Outpatient Encounter Medications as of 04/12/2024  Medication Sig   amantadine  (SYMMETREL ) 100 MG capsule TAKE 1 CAPSULE (100 MG TOTAL) BY MOUTH THREE TIMES DAILY.   carbidopa -levodopa  (SINEMET  IR) 25-100 MG tablet TAKE 2 TABLETS BY MOUTH AT 7AM/2 TABS AT 11AM/ 2 TABS AT 4PM   ferrous sulfate  325 (65 FE) MG tablet Take 65 mg by mouth in the morning and at bedtime.   omeprazole  (PRILOSEC) 40 MG capsule Take 1 capsule (40 mg total) by mouth 2 (two) times daily.   rOPINIRole  (REQUIP ) 3 MG tablet TAKE 1 TABLET (3 MG TOTAL) BY MOUTH 3 (THREE) TIMES DAILY.   tamsulosin (FLOMAX) 0.4 MG CAPS capsule Take 0.4 mg by mouth 1 day or 1 dose. Dr. JUDITHANN Foot   No facility-administered encounter medications on file as of 04/12/2024.    Objective:   PHYSICAL EXAMINATION:    VITALS:   There were no vitals filed for this visit.   GEN:  The patient appears stated age and is in NAD. HEENT:  Normocephalic, AT.   CV:  RRR  Lungs :  ctab  Neurological examination:  Orientation: The patient is alert and oriented x3. Cranial nerves: There is good facial symmetry with mild facial hypomimia. The speech is fluent and clear. Soft palate rises symmetrically and there is no tongue deviation. Hearing is intact to conversational tone.  Sensation: Sensation is intact to light touch throughout Motor: Strength is at least antigravity x4.  Movement examination: Tone: There is nl tone in the UE/LE Abnormal movements: mild dyskinesia in the R leg Coordination:  There is no decremation, with any form of RAMS, including alternating supination and pronation of the forearm, hand opening and closing, finger taps, heel taps and toe taps.  Gait and Station: The patient ambulates well in the hall with good arm swing and no significant dyskinesia.    I have reviewed and interpreted the  following labs independently    Chemistry      Component Value Date/Time   NA 143 09/16/2022 1148   K 4.0 09/16/2022 1148   CL 108 09/02/2022 0935   CO2 24 09/02/2022 0935   BUN 27 (H) 09/02/2022 0935   CREATININE 1.25 (H) 09/02/2022 0935   CREATININE 1.07 06/27/2018 0915      Component Value Date/Time   CALCIUM 9.2 09/02/2022 0935   ALKPHOS 39 09/02/2022 0935   AST 22 09/02/2022 0935   ALT 15 09/02/2022 0935   BILITOT 0.9 09/02/2022 0935       Lab Results  Component Value Date   WBC 3.7 (L) 09/02/2022   HGB 10.9 (L) 09/16/2022   HCT 32.0 (L) 09/16/2022   MCV 96.6 09/02/2022   PLT 156 09/02/2022    Lab Results  Component Value Date   TSH 2.40 11/28/2018    Total time spent on today's visit was *** minutes, including both face-to-face time and nonface-to-face time.  Time included that spent on review of records (prior notes available to me/labs/imaging if pertinent), discussing treatment and goals, answering patient's questions and coordinating care.    Cc:  Plotnikov, Aleksei V, MD

## 2024-04-11 ENCOUNTER — Ambulatory Visit: Payer: Self-pay

## 2024-04-11 NOTE — Telephone Encounter (Signed)
 FYI Only or Action Required?: Action required by provider: request for appointment.  Patient was last seen in primary care on 09/20/2023 by Plotnikov, Karlynn GAILS, MD.  Called Nurse Triage reporting Cough.  Symptoms began several days ago.  Interventions attempted: OTC medications: Mucinex .  Symptoms are: gradually worsening. Productive cough with sinus symptoms.  Triage Disposition: No disposition on file.  Patient/caregiver understands and will follow disposition?:   Copied from CRM [phone]. Topic: Clinical - Red Word Triage >> Apr 11, 2024  8:09 AM Adelita E wrote: [facility] that prompted transfer to Nurse Triage: Heavy productive cough, producing yellow phlegm. Going on since Tuesday. Answer Assessment - Initial Assessment Questions 1. ONSET: When did the cough begin?      Tuesday  2. SEVERITY: How bad is the cough today?      moderate 3. SPUTUM: Describe the color of your sputum (e.g., none, dry cough; clear, white, yellow, green)     yellow 4. HEMOPTYSIS: Are you coughing up any blood? If Yes, ask: How much? (e.g., flecks, streaks, tablespoons, etc.)     no 5. DIFFICULTY BREATHING: Are you having difficulty breathing? If Yes, ask: How bad is it? (e.g., mild, moderate, severe)      no 6. FEVER: Do you have a fever? If Yes, ask: What is your temperature, how was it measured, and when did it start?     no 7. CARDIAC HISTORY: Do you have any history of heart disease? (e.g., heart attack, congestive heart failure)      no 8. LUNG HISTORY: Do you have any history of lung disease?  (e.g., pulmonary embolus, asthma, emphysema)     no 9. PE RISK FACTORS: Do you have a history of blood clots? (or: recent major surgery, recent prolonged travel, bedridden)     no 10. OTHER SYMPTOMS: Do you have any other symptoms? (e.g., runny nose, wheezing, chest pain)       Sore throat, sinus blocked 11. PREGNANCY: Is there any chance you are pregnant? When was your  last menstrual period?       N/a 12. TRAVEL: Have you traveled out of the country in the last month? (e.g., travel history, exposures)       no  Protocols used: Cough - Acute Productive-A-AH

## 2024-04-12 ENCOUNTER — Encounter: Payer: Self-pay | Admitting: Emergency Medicine

## 2024-04-12 ENCOUNTER — Ambulatory Visit: Admitting: Neurology

## 2024-04-12 ENCOUNTER — Encounter: Payer: Self-pay | Admitting: Neurology

## 2024-04-12 ENCOUNTER — Ambulatory Visit (INDEPENDENT_AMBULATORY_CARE_PROVIDER_SITE_OTHER): Admitting: Emergency Medicine

## 2024-04-12 VITALS — BP 120/70 | HR 74 | Wt 165.4 lb

## 2024-04-12 VITALS — BP 118/70 | HR 69 | Temp 97.7°F | Ht 67.0 in | Wt 166.2 lb

## 2024-04-12 DIAGNOSIS — G20B2 Parkinson's disease with dyskinesia, with fluctuations: Secondary | ICD-10-CM

## 2024-04-12 DIAGNOSIS — J22 Unspecified acute lower respiratory infection: Secondary | ICD-10-CM | POA: Diagnosis not present

## 2024-04-12 DIAGNOSIS — R6889 Other general symptoms and signs: Secondary | ICD-10-CM | POA: Insufficient documentation

## 2024-04-12 DIAGNOSIS — R051 Acute cough: Secondary | ICD-10-CM | POA: Diagnosis not present

## 2024-04-12 MED ORDER — AZITHROMYCIN 250 MG PO TABS: ORAL_TABLET | ORAL | 0 refills | Status: DC

## 2024-04-12 MED ORDER — HYDROCODONE BIT-HOMATROP MBR 5-1.5 MG/5ML PO SOLN: 5.0000 mL | Freq: Every evening | ORAL | 0 refills | Status: AC | PRN

## 2024-04-12 NOTE — Assessment & Plan Note (Signed)
 Symptom management discussed. Advised to rest and stay well-hydrated Advised to contact the office if no better or worse during the next several days

## 2024-04-12 NOTE — Progress Notes (Signed)
 William [name] 73 y.o.   Chief Complaint  Patient presents with   Cough    Symptoms: Scratchy throat, tightness in chest, nasal congestion. Taking same OTC medications as wife. Seems to help a little. Started Tuesday.     HISTORY OF PRESENT ILLNESS: Acute problem visit today. This is a 73 y.o. male complaining of flulike symptoms that started 2 days ago.  Wife also sick with similar symptoms Mostly complaining of scratchy throat, nasal congestion and productive cough No other associated symptoms No other complaints or medical concerns today.  Cough Associated symptoms include a sore throat. Pertinent negatives include no chest pain, chills, fever, headaches or rash.     Prior to Admission medications   Medication Sig Start Date End Date Taking? Authorizing Provider  amantadine  (SYMMETREL ) 100 MG capsule TAKE 1 CAPSULE (100 MG TOTAL) BY MOUTH THREE TIMES DAILY. 03/12/24  Yes Tat, Asberry RAMAN, DO  carbidopa -levodopa  (SINEMET  IR) 25-100 MG tablet TAKE 2 TABLETS BY MOUTH AT 7AM/2 TABS AT 11AM/ 2 TABS AT 4PM 03/19/24  Yes Tat, Asberry RAMAN, DO  ferrous sulfate  325 (65 FE) MG tablet Take 65 mg by mouth in the morning and at bedtime.   Yes [provider]  omeprazole  (PRILOSEC) 40 MG capsule Take 1 capsule (40 mg total) by mouth 2 (two) times daily. 11/29/23  Yes Abran Norleen SAILOR, MD  rOPINIRole  (REQUIP ) 3 MG tablet TAKE 1 TABLET (3 MG TOTAL) BY MOUTH 3 (THREE) TIMES DAILY. 03/19/24  Yes Tat, Asberry RAMAN, DO  tamsulosin (FLOMAX) 0.4 MG CAPS capsule Take 0.4 mg by mouth 1 day or 1 dose. Dr. JUDITHANN Foot   Yes [provider]    No Known Allergies  Patient Active Problem List   Diagnosis Date Noted   Pain in finger of right hand 07/13/2023   Acute bacterial bronchitis 12/23/2022   Trigeminal neuralgia 09/16/2022   Synovial cyst of lumbar facet joint 06/28/2022   Degeneration of lumbar intervertebral disc 01/06/2022   Low back pain 12/22/2021   Trigeminal neuralgia of left side of face  10/26/2021   Somatic dysfunction of right sacroiliac joint 02/18/2020   Cough 12/09/2019   Hypertensive disorder 07/23/2019   Lumbosacral spondylosis without myelopathy 05/01/2019   URI (upper respiratory infection) 01/20/2019   Pain in right knee 02/15/2018   Chest pain 05/28/2017   Hyperlipidemia 05/28/2017   Bradycardia 05/28/2017   Chest pain, atypical 05/27/2017   Lightheadedness 05/27/2017   Finger laceration 04/04/2017   Parkinson's disease (HCC) 02/26/2016   Eustachian tube dysfunction 02/06/2016   Hyponatremia 01/26/2016   Tremor 11/12/2015   Visual-spatial impairment 11/12/2015   Stress at work 10/24/2014   Nonspecific abnormal electrocardiogram (ECG) (EKG) 06/18/2014   S/P colectomy 06/18/2014   Well adult exam 06/18/2014   Renal stones 06/18/2014   Elevated BP 06/18/2014   Headache 04/25/2014   Essential hypertension, benign 04/25/2014   Serous otitis media 03/21/2014   Travel advice encounter 04/17/2013   Calcium nephrolithiasis 10/04/2012   Routine health maintenance 10/04/2012   Acute left flank pain 08/03/2011   Unspecified intestinal obstruction 12/03/2008   Other diseases of nasal cavity and sinuses(478.19) 05/30/2008   GERD 02/26/2008   PEPTIC STRICTURE 02/22/2008   HIATAL HERNIA 02/22/2008   Chest tightness or pressure 01/09/2008   ERECTILE DYSFUNCTION 04/07/2007   Allergic rhinitis 04/07/2007   COLITIS, ULCERATIVE NOS 04/07/2007   GASTRIC POLYP, HX OF 04/07/2007    Past Medical History:  Diagnosis Date   Allergic rhinitis    Anemia,  iron deficiency    Arthritis    BPH associated with nocturia    ED (erectile dysfunction)    GERD (gastroesophageal reflux disease)    Hiatal hernia    History of COVID-19 11/2020   per pt moderate symptoms that resovled   History of hypertension    followed by pcp  (per pt resolved no medication since approx 2017)   History of kidney stones    Left ureteral calculus    Parkinson disease [facility])     neurologist--- dr tat;   mild jaw dyskinesia,   gait disorder, tremors   Peyronie's disease    PONV (postoperative nausea and vomiting)    S/P dilatation of esophageal stricture    Trigeminal neuropathy    left side   Ulcerative colitis    followed by dr abran (GI)//   1992 s/p  total abdominal colectomy with ileoanal anastomosis   Wears glasses     Past Surgical History:  Procedure Laterality Date   CRANIECTOMY Left 09/16/2022   Procedure: RETROSIGMOID CRANI FOR MICROVASCULAR DECOMPRESSION;  Surgeon: Carollee Lani BROCKS, DO;  Location: [facility] OR;  Service: Neurosurgery;  Laterality: Left;   CYSTOSCOPY W/ URETERAL STENT PLACEMENT  2012   for ureteral stricture   CYSTOSCOPY WITH RETROGRADE PYELOGRAM, URETEROSCOPY AND STENT PLACEMENT Left 12/31/2021   Procedure: CYSTOSCOPY WITH RETROGRADE PYELOGRAM, FLUOROSCOPIC INTERPRETATION, URETEROSCOPY  AND STENT PLACEMENT;  Surgeon: Matilda Senior, MD;  Location: [facility];  Service: Urology;  Laterality: Left;   CYSTOSCOPY/URETEROSCOPY/HOLMIUM LASER/STENT PLACEMENT  10/22/2005   @[facility] ;   w/ left ureteral balloon dilatation   HOLMIUM LASER APPLICATION Left 12/31/2021   Procedure: HOLMIUM LASER APPLICATION;  Surgeon: Matilda Senior, MD;  Location: [facility];  Service: Urology;  Laterality: Left;   INGUINAL HERNIA REPAIR     per pt unilateral  approxl 1962   KNEE ARTHROSCOPY Right 02/21/2002   @[facility]    LUMBAR LAMINECTOMY/DECOMPRESSION MICRODISCECTOMY N/A 06/28/2022   Procedure: L4-5 decompression with removal of synovial cyst;  Surgeon: Burnetta Aures, MD;  Location: [facility] OR;  Service: Orthopedics;  Laterality: N/A;  2.5 hrs 3 C-Bed   NASAL SINUS SURGERY     x2  in 2004   RECONSTRUCTION TENDON PULLEY HAND Right 05/03/2007   @[facility] ;  right long finger   TOTAL COLECTOMY  1992   W/  ILIEOANAL ANASTOMOSIS (for ilieoanal anatomatic stricture , partial bowel obstruction and required peroidic pneumatic dilatation's)    VARICOCELECTOMY      Social History   Socioeconomic History   Marital status: Married    Spouse name: Not on file   Number of children: 0   Years of education: Not on file   Highest education level: Bachelor's degree (e.g., BA, AB, BS)  Occupational History   Occupation: Haematologist: [employer]  Tobacco Use   Smoking status: Former    Current packs/day: 0.00    Types: Cigarettes    Start date: 1971    Quit date: 1981    Years since quitting: 44.6    Passive exposure: Past   Smokeless tobacco: Never  Vaping Use   Vaping status: Never Used  Substance and Sexual Activity   Alcohol use: Yes    Alcohol/week: 14.0 standard drinks of alcohol    Types: 14 Glasses of wine per week    Comment: 2 glasses of wine per night   Drug use: No   Sexual activity: Yes  Other Topics Concern   Not on file  Social History Narrative   [employer] grad   Married 13.5 years- divorced; married '05   No children   Work: Special educational needs teacher   Patient is a former smoker. Quit in 1981   Alcohol use- yes social   Daily Caffeine  use 2 cups coffee per day   Illicit drug use- no   Right handed   Two story home   Social Drivers of Health   Financial Resource Strain: Low Risk  (12/15/2023)   Overall Financial Resource Strain (CARDIA)    Difficulty of Paying Living Expenses: Not hard at all  Food Insecurity: No Food Insecurity (12/15/2023)   Hunger Vital Sign    Worried About Running Out of Food in the Last Year: Never true    Ran Out of Food in the Last Year: Never true  Transportation Needs: No Transportation Needs (12/15/2023)   PRAPARE - Administrator, Civil Service (Medical): No    Lack of Transportation (Non-Medical): No  Physical Activity: Insufficiently Active (12/15/2023)   Exercise Vital Sign    Days of Exercise per Week: 3 days    Minutes of Exercise per Session: 40 min  Stress: No Stress Concern Present (12/15/2023)   [employer] of Occupational Health -  Occupational Stress Questionnaire    Feeling of Stress : Not at all  Social Connections: Socially Integrated (12/15/2023)   Social Connection and Isolation Panel    Frequency of Communication with Friends and Family: More than three times a week    Frequency of Social Gatherings with Friends and Family: More than three times a week    Attends Religious Services: More than 4 times per year    Active Member of [employer] or Organizations: Yes    Attends Engineer, structural: More than 4 times per year    Marital Status: Married  Catering manager Violence: Not At Risk (12/15/2023)   Humiliation, Afraid, Rape, and Kick questionnaire    Fear of Current or Ex-Partner: No    Emotionally Abused: No    Physically Abused: No    Sexually Abused: No    Family History  Problem Relation Age of Onset   Cancer Father        rectal cancer and MM   Rectal cancer Father    Dementia Mother    Hypertension Mother    Heart disease Brother        valve repaired at 87   Colon cancer Neg Hx    Esophageal cancer Neg Hx    Stomach cancer Neg Hx      Review of Systems  Constitutional: Negative.  Negative for chills and fever.  HENT:  Positive for congestion and sore throat.   Respiratory:  Positive for cough.   Cardiovascular: Negative.  Negative for chest pain and palpitations.  Gastrointestinal:  Negative for abdominal pain, diarrhea, nausea and vomiting.  Genitourinary: Negative.  Negative for dysuria and hematuria.  Skin: Negative.  Negative for rash.  Neurological: Negative.  Negative for dizziness and headaches.  All other systems reviewed and are negative.   Vitals:   04/12/24 1046  BP: 118/70  Pulse: 69  Temp: 97.7 F (36.5 C)  SpO2: 98%    Physical Exam Vitals reviewed.  Constitutional:      Appearance: Normal appearance.  HENT:     Head: Normocephalic.     Mouth/Throat:     Mouth: Mucous membranes are moist.     Pharynx: Oropharynx is clear.  Eyes:  Extraocular  Movements: Extraocular movements intact.     Pupils: Pupils are equal, round, and reactive to light.  Cardiovascular:     Rate and Rhythm: Normal rate and regular rhythm.     Pulses: Normal pulses.     Heart sounds: Normal heart sounds.  Pulmonary:     Effort: Pulmonary effort is normal.     Breath sounds: Normal breath sounds.  Musculoskeletal:     Cervical back: No tenderness.  Lymphadenopathy:     Cervical: No cervical adenopathy.  Skin:    General: Skin is warm and dry.  Neurological:     Mental Status: He is alert and oriented to person, place, and time.  Psychiatric:        Mood and Affect: Mood normal.        Behavior: Behavior normal.      ASSESSMENT & PLAN: A total of 32 minutes was spent with the patient and counseling/coordination of care regarding preparing for this visit, review of most recent office visit notes, review of all medications and chronic medical conditions, diagnosis of lower respiratory infection and need for antibiotics, symptom management, prognosis, documentation and need for follow-up if no better or worse during the next several days.  Problem List Items Addressed This Visit       Respiratory   Lower respiratory infection - Primary   Upper viral respiratory infection now with secondary bacterial infection Clinically stable.  No signs of pneumonia. Recommend to start azithromycin  daily for 5 days Symptom management discussed Advised to rest and stay well-hydrated Advised to contact the office if no better or worse during the next several days       Relevant Medications   azithromycin  (ZITHROMAX ) 250 MG tablet     Other   Cough   Cough management discussed Continue over-the-counter Mucinex  DM and cough drops Advised to rest and stay well-hydrated Hycodan syrup at bedtime as needed      Relevant Medications   HYDROcodone  bit-homatropine (HYCODAN) 5-1.5 MG/5ML syrup   Flu-like symptoms   Symptom management discussed Advised to rest  and stay well-hydrated Advised to contact the office if no better or worse during the next several days      Patient Instructions  Acute Bronchitis, Adult  Acute bronchitis is when air tubes in the lungs (bronchi) suddenly get swollen. The condition can make it hard for you to breathe. In adults, acute bronchitis usually goes away within 2 weeks. A cough caused by bronchitis may last up to 3 weeks. Smoking, allergies, and asthma can make the condition worse. What are the causes? Germs that cause cold and flu (viruses). The most common cause of this condition is the virus that causes the common cold. Bacteria. Substances that bother (irritate) the lungs, including: Smoke from cigarettes and other types of tobacco. Dust and pollen. Fumes from chemicals, gases, or burned fuel. Indoor or outdoor air pollution. What increases the risk? A weak body's defense system. This is also called the immune system. Any condition that affects your lungs and breathing, such as asthma. What are the signs or symptoms? A cough. Coughing up clear, yellow, or green mucus. Making high-pitched whistling sounds when you breathe, most often when you breathe out (wheezing). Runny or stuffy nose. Having too much mucus in your lungs (chest congestion). Shortness of breath. Body aches. A sore throat. How is this treated? Acute bronchitis may go away over time without treatment. Your doctor may tell you to: Drink more fluids. This will help thin  your mucus so it is easier to cough up. Use a device that gets medicine into your lungs (inhaler). Use a vaporizer or a humidifier. These are machines that add water to the air. This helps with coughing and poor breathing. Take a medicine that thins mucus and helps clear it from your lungs. Take a medicine that prevents or stops coughing. It is not common to take an antibiotic medicine for this condition. Follow these instructions at home:  Take over-the-counter and  prescription medicines only as told by your doctor. Use an inhaler, vaporizer, or humidifier as told by your doctor. Take two teaspoons (10 mL) of honey at bedtime. This helps lessen your coughing at night. Drink enough fluid to keep your pee (urine) pale yellow. Do not smoke or use any products that contain nicotine or tobacco. If you need help quitting, ask your doctor. Get a lot of rest. Return to your normal activities when your doctor says that it is safe. Keep all follow-up visits. How is this prevented?  Wash your hands often with soap and water for at least 20 seconds. If you cannot use soap and water, use hand sanitizer. Avoid contact with people who have cold symptoms. Try not to touch your mouth, nose, or eyes with your hands. Avoid breathing in smoke or chemical fumes. Make sure to get the flu shot every year. Contact a doctor if: Your symptoms do not get better in 2 weeks. You have trouble coughing up the mucus. Your cough keeps you awake at night. You have a fever. Get help right away if: You cough up blood. You have chest pain. You have very bad shortness of breath. You faint or keep feeling like you are going to faint. You have a very bad headache. Your fever or chills get worse. These symptoms may be an emergency. Get help right away. Call your local emergency services (911 in the [place] Do not wait to see if the symptoms will go away. Do not drive yourself to the hospital. Summary Acute bronchitis is when air tubes in the lungs (bronchi) suddenly get swollen. In adults, acute bronchitis usually goes away within 2 weeks. Drink more fluids. This will help thin your mucus so it is easier to cough up. Take over-the-counter and prescription medicines only as told by your doctor. Contact a doctor if your symptoms do not improve after 2 weeks of treatment. This information is not intended to replace advice given to you by your health care provider. Make sure you discuss  any questions you have with your health care provider. Document Revised: 12/31/2020 Document Reviewed: 12/31/2020 Elsevier Patient Education  2024 Elsevier Inc.    Emil Schaumann, MD [facility]

## 2024-04-12 NOTE — Assessment & Plan Note (Signed)
 Cough management discussed Continue over-the-counter Mucinex  DM and cough drops Advised to rest and stay well-hydrated Hycodan syrup at bedtime as needed

## 2024-04-12 NOTE — Assessment & Plan Note (Signed)
 Upper viral respiratory infection now with secondary bacterial infection Clinically stable.  No signs of pneumonia. Recommend to start azithromycin  daily for 5 days Symptom management discussed Advised to rest and stay well-hydrated Advised to contact the office if no better or worse during the next several days

## 2024-04-12 NOTE — Patient Instructions (Signed)
 Acute Bronchitis, Adult  Acute bronchitis is when air tubes in the lungs (bronchi) suddenly get swollen. The condition can make it hard for you to breathe. In adults, acute bronchitis usually goes away within 2 weeks. A cough caused by bronchitis may last up to 3 weeks. Smoking, allergies, and asthma can make the condition worse. What are the causes? Germs that cause cold and flu (viruses). The most common cause of this condition is the virus that causes the common cold. Bacteria. Substances that bother (irritate) the lungs, including: Smoke from cigarettes and other types of tobacco. Dust and pollen. Fumes from chemicals, gases, or burned fuel. Indoor or outdoor air pollution. What increases the risk? A weak body's defense system. This is also called the immune system. Any condition that affects your lungs and breathing, such as asthma. What are the signs or symptoms? A cough. Coughing up clear, yellow, or green mucus. Making high-pitched whistling sounds when you breathe, most often when you breathe out (wheezing). Runny or stuffy nose. Having too much mucus in your lungs (chest congestion). Shortness of breath. Body aches. A sore throat. How is this treated? Acute bronchitis may go away over time without treatment. Your doctor may tell you to: Drink more fluids. This will help thin your mucus so it is easier to cough up. Use a device that gets medicine into your lungs (inhaler). Use a vaporizer or a humidifier. These are machines that add water to the air. This helps with coughing and poor breathing. Take a medicine that thins mucus and helps clear it from your lungs. Take a medicine that prevents or stops coughing. It is not common to take an antibiotic medicine for this condition. Follow these instructions at home:  Take over-the-counter and prescription medicines only as told by your doctor. Use an inhaler, vaporizer, or humidifier as told by your doctor. Take two teaspoons  (10 mL) of honey at bedtime. This helps lessen your coughing at night. Drink enough fluid to keep your pee (urine) pale yellow. Do not smoke or use any products that contain nicotine or tobacco. If you need help quitting, ask your doctor. Get a lot of rest. Return to your normal activities when your doctor says that it is safe. Keep all follow-up visits. How is this prevented?  Wash your hands often with soap and water for at least 20 seconds. If you cannot use soap and water, use hand sanitizer. Avoid contact with people who have cold symptoms. Try not to touch your mouth, nose, or eyes with your hands. Avoid breathing in smoke or chemical fumes. Make sure to get the flu shot every year. Contact a doctor if: Your symptoms do not get better in 2 weeks. You have trouble coughing up the mucus. Your cough keeps you awake at night. You have a fever. Get help right away if: You cough up blood. You have chest pain. You have very bad shortness of breath. You faint or keep feeling like you are going to faint. You have a very bad headache. Your fever or chills get worse. These symptoms may be an emergency. Get help right away. Call your local emergency services (911 in the [place] Do not wait to see if the symptoms will go away. Do not drive yourself to the hospital. Summary Acute bronchitis is when air tubes in the lungs (bronchi) suddenly get swollen. In adults, acute bronchitis usually goes away within 2 weeks. Drink more fluids. This will help thin your mucus so it is easier  to cough up. Take over-the-counter and prescription medicines only as told by your doctor. Contact a doctor if your symptoms do not improve after 2 weeks of treatment. This information is not intended to replace advice given to you by your health care provider. Make sure you discuss any questions you have with your health care provider. Document Revised: 12/31/2020 Document Reviewed: 12/31/2020 Elsevier Patient  Education  2024 [employer].

## 2024-04-30 DIAGNOSIS — M545 Low back pain, unspecified: Secondary | ICD-10-CM | POA: Diagnosis not present

## 2024-05-07 DIAGNOSIS — N50812 Left testicular pain: Secondary | ICD-10-CM | POA: Diagnosis not present

## 2024-05-14 NOTE — Progress Notes (Deleted)
 Ellouise Console, PA-C [address] [place], [place]  [address] Phone: [phone]   Primary Care Physician: Garald Karlynn GAILS, MD  Primary Gastroenterologist:  Ellouise Console, PA-C / Norleen Kiang, MD   Chief Complaint: Chest pain       HPI:   William [name] is a 73 y.o. male, established patient Dr. Kiang, presents for 67-month follow-up of GERD and chest pain.  Patient last saw Dr. Kiang 11/2023.  He has history of erosive esophagitis, peptic stricture, iron deficiency anemia, remote ulcerative colitis s/p total abdominal colectomy with ileoanal anastomosis in 1992, ileoanal anastomotic stricture with partial bowel obstruction requiring periodic endoscopic pneumatic dilation.  Last anastomotic dilation was March 2016.  He also has Parkinson's disease, followed by Dr. Evonnie.  He is taking omeprazole  40 Mg twice daily, Metamucil daily, iron daily.  Has been doing well on this treatment.  Current Outpatient Medications  Medication Sig Dispense Refill   amantadine  (SYMMETREL ) 100 MG capsule TAKE 1 CAPSULE (100 MG TOTAL) BY MOUTH THREE TIMES DAILY. 270 capsule 0   azithromycin  (ZITHROMAX ) 250 MG tablet Sig as indicated 6 tablet 0   carbidopa -levodopa  (SINEMET  IR) 25-100 MG tablet TAKE 2 TABLETS BY MOUTH AT 7AM/2 TABS AT 11AM/ 2 TABS AT 4PM 540 tablet 0   ferrous sulfate  325 (65 FE) MG tablet Take 65 mg by mouth in the morning and at bedtime.     HYDROcodone  bit-homatropine (HYCODAN) 5-1.5 MG/5ML syrup Take 5 mLs by mouth at bedtime as needed for cough. 120 mL 0   omeprazole  (PRILOSEC) 40 MG capsule Take 1 capsule (40 mg total) by mouth 2 (two) times daily. 180 capsule 3   rOPINIRole  (REQUIP ) 3 MG tablet TAKE 1 TABLET (3 MG TOTAL) BY MOUTH 3 (THREE) TIMES DAILY. 270 tablet 0   tamsulosin (FLOMAX) 0.4 MG CAPS capsule Take 0.4 mg by mouth 1 day or 1 dose. Dr. JUDITHANN Foot     No current facility-administered medications for this visit.    Allergies as of 05/15/2024   (No Known  Allergies)    Past Medical History:  Diagnosis Date   Allergic rhinitis    Anemia, iron deficiency    Arthritis    BPH associated with nocturia    ED (erectile dysfunction)    GERD (gastroesophageal reflux disease)    Hiatal hernia    History of COVID-19 11/2020   per pt moderate symptoms that resovled   History of hypertension    followed by pcp  (per pt resolved no medication since approx 2017)   History of kidney stones    Left ureteral calculus    Parkinson disease [facility])    neurologist--- dr tat;   mild jaw dyskinesia,   gait disorder, tremors   Peyronie's disease    PONV (postoperative nausea and vomiting)    S/P dilatation of esophageal stricture    Trigeminal neuropathy    left side   Ulcerative colitis    followed by dr kiang (GI)//   1992 s/p  total abdominal colectomy with ileoanal anastomosis   Wears glasses     Past Surgical History:  Procedure Laterality Date   CRANIECTOMY Left 09/16/2022   Procedure: RETROSIGMOID CRANI FOR MICROVASCULAR DECOMPRESSION;  Surgeon: Carollee Lani BROCKS, DO;  Location: [facility] OR;  Service: Neurosurgery;  Laterality: Left;   CYSTOSCOPY W/ URETERAL STENT PLACEMENT  2012   for ureteral stricture   CYSTOSCOPY WITH RETROGRADE PYELOGRAM, URETEROSCOPY AND STENT PLACEMENT Left 12/31/2021   Procedure: CYSTOSCOPY  WITH RETROGRADE PYELOGRAM, FLUOROSCOPIC INTERPRETATION, URETEROSCOPY  AND STENT PLACEMENT;  Surgeon: Matilda Senior, MD;  Location: [facility];  Service: Urology;  Laterality: Left;   CYSTOSCOPY/URETEROSCOPY/HOLMIUM LASER/STENT PLACEMENT  10/22/2005   @[facility] ;   w/ left ureteral balloon dilatation   HOLMIUM LASER APPLICATION Left 12/31/2021   Procedure: HOLMIUM LASER APPLICATION;  Surgeon: Matilda Senior, MD;  Location: [facility];  Service: Urology;  Laterality: Left;   INGUINAL HERNIA REPAIR     per pt unilateral  approxl 1962   KNEE ARTHROSCOPY Right 02/21/2002   @[facility]    LUMBAR LAMINECTOMY/DECOMPRESSION  MICRODISCECTOMY N/A 06/28/2022   Procedure: L4-5 decompression with removal of synovial cyst;  Surgeon: Burnetta Aures, MD;  Location: [facility] OR;  Service: Orthopedics;  Laterality: N/A;  2.5 hrs 3 C-Bed   NASAL SINUS SURGERY     x2  in 2004   RECONSTRUCTION TENDON PULLEY HAND Right 05/03/2007   @[facility] ;  right long finger   TOTAL COLECTOMY  1992   W/  ILIEOANAL ANASTOMOSIS (for ilieoanal anatomatic stricture , partial bowel obstruction and required peroidic pneumatic dilatation's)   VARICOCELECTOMY      Review of Systems:    All systems reviewed and negative except where noted in HPI.    Physical Exam:  There were no vitals taken for this visit. No LMP for male patient.  General: Well-nourished, well-developed in no acute distress.  Lungs: Clear to auscultation bilaterally. Non-labored. Heart: Regular rate and rhythm, no murmurs rubs or gallops.  Abdomen: Bowel sounds are normal; Abdomen is Soft; No hepatosplenomegaly, masses or hernias;  No Abdominal Tenderness; No guarding or rebound tenderness. Neuro: Alert and oriented x 3.  Grossly intact.  Psych: Alert and cooperative, normal mood and affect.   Imaging Studies: No results found.  Labs: CBC    Component Value Date/Time   WBC 3.7 (L) 09/02/2022 0935   RBC 3.86 (L) 09/02/2022 0935   HGB 10.9 (L) 09/16/2022 1148   HCT 32.0 (L) 09/16/2022 1148   PLT 156 09/02/2022 0935   MCV 96.6 09/02/2022 0935   MCH 34.7 (H) 09/02/2022 0935   MCHC 35.9 09/02/2022 0935   RDW 13.1 09/02/2022 0935   LYMPHSABS 513 (L) 11/28/2018 0901   MONOABS 0.3 05/28/2017 0231   EOSABS 110 11/28/2018 0901   BASOSABS 42 11/28/2018 0901    CMP     Component Value Date/Time   NA 143 09/16/2022 1148   K 4.0 09/16/2022 1148   CL 108 09/02/2022 0935   CO2 24 09/02/2022 0935   GLUCOSE 112 (H) 09/02/2022 0935   GLUCOSE 108 (H) 08/17/2006 0744   BUN 27 (H) 09/02/2022 0935   CREATININE 1.25 (H) 09/02/2022 0935   CREATININE 1.07 06/27/2018 0915    CALCIUM 9.2 09/02/2022 0935   PROT 6.2 (L) 09/02/2022 0935   ALBUMIN  3.7 09/02/2022 0935   AST 22 09/02/2022 0935   ALT 15 09/02/2022 0935   ALKPHOS 39 09/02/2022 0935   BILITOT 0.9 09/02/2022 0935   GFRNONAA >60 09/02/2022 0935   GFRAA >60 05/28/2017 0231       Assessment and Plan:   William [name] is a 73 y.o. y/o male returns for 48-month follow-up of:  1.  GERD with erosive esophagitis.  Peptic stricture.  Asymptomatic on PPI - Continue omeprazole  40 Mg twice daily - Recommend Lifestyle Modifications to prevent Acid Reflux.  Rec. Avoid coffee, sodas, peppermint, garlic, onions, alcohol, citrus fruits, chocolate, tomatoes, fatty and spicey foods.  Avoid eating 2-3 hours before  bedtime.     2.  Iron deficiency anemia secondary to the same.  On iron replacement - Labs: CBC, iron panel, ferritin? - Continue ferrous sulfate  325 mg twice daily  3.  Remote history of ulcerative colitis status post total abdominal colectomy with ileoanal anastomosis in 1992   4.  History of anastomotic stricture requiring pneumatic dilation. Last Dilation in 2016. - Flex sig with dilation as needed   5.  Chronic constipation.  Improved with fiber oral - Continue Metamucil    Ellouise Console, PA-C  Follow up ***

## 2024-05-15 ENCOUNTER — Ambulatory Visit: Admitting: Physician Assistant

## 2024-05-17 DIAGNOSIS — M5451 Vertebrogenic low back pain: Secondary | ICD-10-CM | POA: Diagnosis not present

## 2024-05-22 DIAGNOSIS — Z23 Encounter for immunization: Secondary | ICD-10-CM | POA: Diagnosis not present

## 2024-06-07 ENCOUNTER — Encounter: Payer: Self-pay | Admitting: Neurology

## 2024-06-07 ENCOUNTER — Other Ambulatory Visit: Payer: Self-pay | Admitting: Neurology

## 2024-06-07 DIAGNOSIS — G20B2 Parkinson's disease with dyskinesia, with fluctuations: Secondary | ICD-10-CM

## 2024-06-11 DIAGNOSIS — M5451 Vertebrogenic low back pain: Secondary | ICD-10-CM | POA: Diagnosis not present

## 2024-06-13 ENCOUNTER — Telehealth: Payer: Self-pay | Admitting: Neurology

## 2024-06-13 ENCOUNTER — Other Ambulatory Visit: Payer: Self-pay

## 2024-06-13 DIAGNOSIS — G20A1 Parkinson's disease without dyskinesia, without mention of fluctuations: Secondary | ICD-10-CM

## 2024-06-13 DIAGNOSIS — G20B1 Parkinson's disease with dyskinesia, without mention of fluctuations: Secondary | ICD-10-CM

## 2024-06-13 MED ORDER — ROPINIROLE HCL 3 MG PO TABS: 3.0000 mg | ORAL_TABLET | Freq: Three times a day (TID) | ORAL | 0 refills | Status: DC

## 2024-06-13 NOTE — Telephone Encounter (Signed)
 Pt cld for Rx refill rOPINIRole  (REQUIP ) 3 MG tablet to store# [phone] [facility] [address] [place]

## 2024-06-17 ENCOUNTER — Other Ambulatory Visit: Payer: Self-pay | Admitting: Neurology

## 2024-06-17 DIAGNOSIS — G20A1 Parkinson's disease without dyskinesia, without mention of fluctuations: Secondary | ICD-10-CM

## 2024-06-17 DIAGNOSIS — G20B1 Parkinson's disease with dyskinesia, without mention of fluctuations: Secondary | ICD-10-CM

## 2024-06-19 ENCOUNTER — Other Ambulatory Visit: Payer: Self-pay

## 2024-06-19 ENCOUNTER — Telehealth: Payer: Self-pay | Admitting: Neurology

## 2024-06-19 DIAGNOSIS — G20A1 Parkinson's disease without dyskinesia, without mention of fluctuations: Secondary | ICD-10-CM

## 2024-06-19 DIAGNOSIS — G20B1 Parkinson's disease with dyskinesia, without mention of fluctuations: Secondary | ICD-10-CM

## 2024-06-19 MED ORDER — CARBIDOPA-LEVODOPA 25-100 MG PO TABS: ORAL_TABLET | ORAL | 0 refills | Status: DC

## 2024-06-19 MED ORDER — ROPINIROLE HCL 3 MG PO TABS: 3.0000 mg | ORAL_TABLET | Freq: Three times a day (TID) | ORAL | 0 refills | Status: DC

## 2024-06-19 NOTE — Telephone Encounter (Signed)
 Pt needs refills on rOPINIRole  (REQUIP ) 3 MG tablet , and  carbidopa -levodopa  (SINEMET  IR) 25-100 MG tablet , and needs to be sent to [facility] [address] in [place]. Thanks

## 2024-06-20 DIAGNOSIS — M5451 Vertebrogenic low back pain: Secondary | ICD-10-CM | POA: Diagnosis not present

## 2024-06-20 NOTE — Therapy (Signed)
 [facility] [address] [place], [place], [address] Phone: [phone]   Fax:  [phone]  Patient Details  Name: William [name] MRN: [id number] Date of Birth: Dec 11, 1950 Referring Provider:  Garald Karlynn GAILS, MD  Encounter Date: 06/21/2024  Occupational Therapy Parkinson's Disease Screen  Hand dominance:  Rt    Physical Performance Test item #2 (simulated eating):  10.33 sec  Physical Performance Test item #4 (donning/doffing jacket):  8.80 sec  Fastening/unfastening 3 buttons in:  29.99 sec  9-hole peg test:    RUE  26 sec  (slightly slower)      LUE  32 sec (slower)   Change in ability to perform ADLs/IADLs:  handwriting is the same but reports speech has gotten a little worse (hesitation in speech)   Other Comments:  postural tremors, BUE AROM WFL's w/ cues to extend Lt elbow  Pt does not require occupational therapy services at this time.  Recommended occupational therapy screen in 6 months. Pt instructed to do coordination HEP on more regular basis as this declined some    Burnard JINNY Roads, OT 06/20/2024, 4:29 PM  [facility] [address] [place], [place], [address] Phone: [phone]   Fax:  [phone]

## 2024-06-21 ENCOUNTER — Ambulatory Visit: Attending: Neurology | Admitting: Occupational Therapy

## 2024-06-21 ENCOUNTER — Ambulatory Visit: Admitting: Speech Pathology

## 2024-06-21 ENCOUNTER — Ambulatory Visit: Admitting: Physical Therapy

## 2024-06-21 DIAGNOSIS — R278 Other lack of coordination: Secondary | ICD-10-CM | POA: Insufficient documentation

## 2024-06-21 DIAGNOSIS — R2681 Unsteadiness on feet: Secondary | ICD-10-CM

## 2024-06-21 NOTE — Therapy (Signed)
 [facility] [address] [place], [place], [address] Phone: [phone]   Fax:  [phone]  Patient Details  Name: William [name] MRN: [id number] Date of Birth: 08/01/1951 Referring Provider:  Garald Karlynn GAILS, MD  Encounter Date: 06/21/2024  Physical Therapy Parkinson's Disease Screen   Timed Up and Go test:7.3 seconds (previously 7.1 seconds)  10 meter walk test: 8.2 seconds = 4.0 ft/sec (previously 9.1 seconds = 3.60 ft/sec)  5 time sit to stand test:8.4 seconds with no UE support (previously 10.8 seconds)  No falls. Has been trying to get out and walk with the dog - about 1.5 miles. Has a full gym at home. Notices fatigue sets in more quickly after not much of a workout. Was doing some PT at [facility] for his back and that is doing better. Notices he has some difficulties with getting the words out. Has the speech therapy exercises from last time.   Patient does not require Physical Therapy services at this time.  Recommend Physical Therapy screen in 6 months.    Sheffield LOISE Senate, PT, DPT 06/21/2024, 8:19 AM  [facility] [address] [place], [place], [address] Phone: [phone]   Fax:  [phone]

## 2024-06-25 DIAGNOSIS — R399 Unspecified symptoms and signs involving the genitourinary system: Secondary | ICD-10-CM | POA: Diagnosis not present

## 2024-06-25 DIAGNOSIS — N401 Enlarged prostate with lower urinary tract symptoms: Secondary | ICD-10-CM | POA: Diagnosis not present

## 2024-06-25 DIAGNOSIS — N5082 Scrotal pain: Secondary | ICD-10-CM | POA: Diagnosis not present

## 2024-06-28 ENCOUNTER — Emergency Department (HOSPITAL_COMMUNITY)
Admission: EM | Admit: 2024-06-28 | Discharge: 2024-06-28 | Disposition: A | Discharge status: Home / Self Care | Attending: Emergency Medicine | Admitting: Emergency Medicine

## 2024-06-28 ENCOUNTER — Ambulatory Visit: Payer: Self-pay | Admitting: Internal Medicine

## 2024-06-28 ENCOUNTER — Emergency Department (HOSPITAL_COMMUNITY)

## 2024-06-28 ENCOUNTER — Other Ambulatory Visit: Payer: Self-pay

## 2024-06-28 DIAGNOSIS — G20C Parkinsonism, unspecified: Secondary | ICD-10-CM | POA: Insufficient documentation

## 2024-06-28 DIAGNOSIS — R0789 Other chest pain: Secondary | ICD-10-CM | POA: Diagnosis not present

## 2024-06-28 DIAGNOSIS — Z87891 Personal history of nicotine dependence: Secondary | ICD-10-CM | POA: Diagnosis not present

## 2024-06-28 DIAGNOSIS — R079 Chest pain, unspecified: Secondary | ICD-10-CM | POA: Diagnosis not present

## 2024-06-28 DIAGNOSIS — R918 Other nonspecific abnormal finding of lung field: Secondary | ICD-10-CM | POA: Diagnosis not present

## 2024-06-28 DIAGNOSIS — I10 Essential (primary) hypertension: Secondary | ICD-10-CM | POA: Diagnosis not present

## 2024-06-28 DIAGNOSIS — Z8616 Personal history of COVID-19: Secondary | ICD-10-CM | POA: Diagnosis not present

## 2024-06-28 DIAGNOSIS — Z79899 Other long term (current) drug therapy: Secondary | ICD-10-CM | POA: Diagnosis not present

## 2024-06-28 LAB — CBC
HCT: 42.1 % (ref 39.0–52.0)
Hemoglobin: 14.4 g/dL (ref 13.0–17.0)
MCH: 33.7 pg (ref 26.0–34.0)
MCHC: 34.2 g/dL (ref 30.0–36.0)
MCV: 98.6 fL (ref 80.0–100.0)
Platelets: 160 K/uL (ref 150–400)
RBC: 4.27 MIL/uL (ref 4.22–5.81)
RDW: 12.2 % (ref 11.5–15.5)
WBC: 4.5 K/uL (ref 4.0–10.5)
nRBC: 0 % (ref 0.0–0.2)

## 2024-06-28 LAB — BASIC METABOLIC PANEL WITH GFR
Anion gap: 11 (ref 5–15)
BUN: 22 mg/dL (ref 8–23)
CO2: 22 mmol/L (ref 22–32)
Calcium: 9.7 mg/dL (ref 8.9–10.3)
Chloride: 104 mmol/L (ref 98–111)
Creatinine, Ser: 1.15 mg/dL (ref 0.61–1.24)
GFR, Estimated: >60 mL/min (ref >60)
Glucose, Bld: 127 mg/dL — ABNORMAL HIGH (ref 70–99)
Potassium: 4.3 mmol/L (ref 3.5–5.1)
Sodium: 138 mmol/L (ref 135–145)

## 2024-06-28 LAB — TROPONIN T, HIGH SENSITIVITY
Troponin T High Sensitivity: <15 ng/L (ref 0–19)
Troponin T High Sensitivity: 15 ng/L (ref 0–19)

## 2024-06-28 NOTE — ED Triage Notes (Signed)
 Pt ambulatory to triage with complaints of RIGHT sided chest pain that began mildly yesterday, and was more intense upon waking up. Pt denies activity, denies cardiac hx, denies shortness of breath.   Pt appears to be in no distress.

## 2024-06-28 NOTE — Telephone Encounter (Signed)
 FYI Only or Action Required?: FYI only for provider.  Patient was last seen in primary care on 04/12/2024 by Purcell Emil Schanz, MD.  Called Nurse Triage reporting Chest Pain.  Symptoms began yesterday.  Triage Disposition: Go to ED Now (Notify PCP)  Patient/caregiver understands and will follow disposition?: Yes               Copied from CRM [id number]. Topic: Clinical - Red Word Triage >> Jun 28, 2024 11:02 AM Macario HERO wrote: Red Word that prompted transfer to Nurse Triage: Patient called said he's experiencing some discomfort in his chest. Reason for Disposition  [1] Chest pain (or angina) comes and goes AND [2] is happening more often (increasing in frequency) or getting worse (increasing in severity)  (Exception: Chest pains that last only a few seconds.)  Answer Assessment - Initial Assessment Questions This RN recommends pt goes to ED and has another adult drive pt there. Pt agreeable.   Chest discomfort intermittent started last night Right behind sternum Pt thought it might be indigestion  3-4/10 pain level, constant right now Denies pain radiation Dull ache    CARDIAC RISK FACTORS: Do you have any history of heart problems or risk factors for heart disease? (e.g., angina, prior heart attack; diabetes, high blood pressure, high cholesterol, smoker, or strong family history of heart disease)     No  OTHER SYMPTOMS: Do you have any other symptoms? (e.g., dizziness, nausea, vomiting, sweating, fever, difficulty breathing, cough)       No  Protocols used: Chest Pain-A-AH

## 2024-06-28 NOTE — Discharge Instructions (Addendum)
 It was a pleasure caring for you today in the emergency department.  Return to the Emergency Department if you have unusual chest pain, pressure, or discomfort, shortness of breath, nausea, vomiting, burping, heartburn, tingling upper body parts, sweating, cold, clammy skin, or racing heartbeat. Call 911 if you think you are having a heart attack. Take all medications as prescribed - notify your doctor if you have any side effects. Follow cardiac diet - avoid fatty & fried foods, don't eat too much red meat, eat lots of fruits & vegetables, and dairy products should be low fat. Please lose weight if you are overweight. Become more active with walking, gardening, or any other activity that gets you to moving.   Please return to the emergency department immediately for any new or concerning symptoms, or if you get worse.

## 2024-06-28 NOTE — ED Provider Notes (Signed)
 [place] EMERGENCY DEPARTMENT AT [facility] Provider Note  CSN: [id number] Arrival date & time: 06/28/24 1135  Chief Complaint(s) Chest Pain  HPI William [name] is a 73 y.o. male with past medical history as below, significant for BPH, Parkinson disease, esophageal stricture, arthritis, ulcerative colitis who presents to the ED with complaint of chest pain  Patient reports he experienced episode of chest pain yesterday evening postprandial, aching sensation midsternal and slightly to the right parasternal.  Symptoms resolved.  Symptoms returned this morning after eating.  Similar to prior but pain was more severe.  Midsternal, right parasternal, aching.  Symptoms have since resolved.  No concurrent dyspnea or palpitations, no abdominal pain nausea or vomiting.  No syncope or near syncope.  Denies any diet or medication changes.  Reports he is feeling back to baseline  Past Medical History Past Medical History:  Diagnosis Date   Allergic rhinitis    Anemia, iron deficiency    Arthritis    BPH associated with nocturia    ED (erectile dysfunction)    GERD (gastroesophageal reflux disease)    Hiatal hernia    History of COVID-19 11/2020   per pt moderate symptoms that resovled   History of hypertension    followed by pcp  (per pt resolved no medication since approx 2017)   History of kidney stones    Left ureteral calculus    Parkinson disease [facility])    neurologist--- dr tat;   mild jaw dyskinesia,   gait disorder, tremors   Peyronie's disease    PONV (postoperative nausea and vomiting)    S/P dilatation of esophageal stricture    Trigeminal neuropathy    left side   Ulcerative colitis    followed by dr abran (GI)//   1992 s/p  total abdominal colectomy with ileoanal anastomosis   Wears glasses    Patient Active Problem List   Diagnosis Date Noted   Lower respiratory infection 04/12/2024   Flu-like symptoms 04/12/2024   Pain in finger of right hand 07/13/2023    Acute bacterial bronchitis 12/23/2022   Trigeminal neuralgia 09/16/2022   Synovial cyst of lumbar facet joint 06/28/2022   Degeneration of lumbar intervertebral disc 01/06/2022   Low back pain 12/22/2021   Trigeminal neuralgia of left side of face 10/26/2021   Somatic dysfunction of right sacroiliac joint 02/18/2020   Cough 12/09/2019   Hypertensive disorder 07/23/2019   Lumbosacral spondylosis without myelopathy 05/01/2019   URI (upper respiratory infection) 01/20/2019   Pain in right knee 02/15/2018   Chest pain 05/28/2017   Hyperlipidemia 05/28/2017   Bradycardia 05/28/2017   Chest pain, atypical 05/27/2017   Lightheadedness 05/27/2017   Finger laceration 04/04/2017   Parkinson's disease (HCC) 02/26/2016   Eustachian tube dysfunction 02/06/2016   Hyponatremia 01/26/2016   Tremor 11/12/2015   Visual-spatial impairment 11/12/2015   Stress at work 10/24/2014   Nonspecific abnormal electrocardiogram (ECG) (EKG) 06/18/2014   S/P colectomy 06/18/2014   Well adult exam 06/18/2014   Renal stones 06/18/2014   Elevated BP 06/18/2014   Headache 04/25/2014   Essential hypertension, benign 04/25/2014   Serous otitis media 03/21/2014   Travel advice encounter 04/17/2013   Calcium nephrolithiasis 10/04/2012   Routine health maintenance 10/04/2012   Acute left flank pain 08/03/2011   Unspecified intestinal obstruction 12/03/2008   Other diseases of nasal cavity and sinuses(478.19) 05/30/2008   GERD 02/26/2008   PEPTIC STRICTURE 02/22/2008   HIATAL HERNIA 02/22/2008   Chest tightness or pressure 01/09/2008  ERECTILE DYSFUNCTION 04/07/2007   Allergic rhinitis 04/07/2007   COLITIS, ULCERATIVE NOS 04/07/2007   GASTRIC POLYP, HX OF 04/07/2007   Home Medication(s) Prior to Admission medications   Medication Sig Start Date End Date Taking? Authorizing Provider  amantadine  (SYMMETREL ) 100 MG capsule TAKE 1 CAPSULE (100 MG TOTAL) BY MOUTH 3 TIMES A DAY Patient taking differently: Take  100 mg by mouth in the morning, at noon, and at bedtime. 06/07/24  Yes Tat, Asberry RAMAN, DO  carbidopa -levodopa  (SINEMET  IR) 25-100 MG tablet TAKE 2 TABLETS BY MOUTH AT 7AM/2 TABS AT 11AM/ 2 TABS AT 4PM Patient taking differently: Take 2 tablets by mouth See admin instructions. TAKE 2 TABLETS BY MOUTH AT 7AM/2 TABS AT 11AM/ 2 TABS AT 4PM 06/19/24  Yes Tat, Asberry RAMAN, DO  ferrous sulfate  325 (65 FE) MG tablet Take 65 mg by mouth in the morning and at bedtime.   Yes [provider]  meloxicam (MOBIC) 15 MG tablet Take 15 mg by mouth daily. 06/05/24  Yes [provider]  omeprazole  (PRILOSEC) 40 MG capsule Take 1 capsule (40 mg total) by mouth 2 (two) times daily. 11/29/23  Yes Abran Norleen SAILOR, MD  rOPINIRole  (REQUIP ) 3 MG tablet Take 1 tablet (3 mg total) by mouth 3 (three) times daily. 06/19/24  Yes Tat, Asberry RAMAN, DO  tamsulosin (FLOMAX) 0.4 MG CAPS capsule Take 0.4 mg by mouth at bedtime. Dr. JUDITHANN Foot   Yes [provider]  HYDROcodone  bit-homatropine (HYCODAN) 5-1.5 MG/5ML syrup Take 5 mLs by mouth at bedtime as needed for cough. Patient not taking: Reported on 06/28/2024 04/12/24   Purcell Emil Schanz, MD                                                                                                                                    Past Surgical History Past Surgical History:  Procedure Laterality Date   CRANIECTOMY Left 09/16/2022   Procedure: RETROSIGMOID CRANI FOR MICROVASCULAR DECOMPRESSION;  Surgeon: Carollee Lani BROCKS, DO;  Location: [facility] OR;  Service: Neurosurgery;  Laterality: Left;   CYSTOSCOPY W/ URETERAL STENT PLACEMENT  2012   for ureteral stricture   CYSTOSCOPY WITH RETROGRADE PYELOGRAM, URETEROSCOPY AND STENT PLACEMENT Left 12/31/2021   Procedure: CYSTOSCOPY WITH RETROGRADE PYELOGRAM, FLUOROSCOPIC INTERPRETATION, URETEROSCOPY  AND STENT PLACEMENT;  Surgeon: Matilda Senior, MD;  [facility];  Service: Urology;  Laterality: Left;    CYSTOSCOPY/URETEROSCOPY/HOLMIUM LASER/STENT PLACEMENT  10/22/2005   @[facility] ;   w/ left ureteral balloon dilatation   HOLMIUM LASER APPLICATION Left 12/31/2021   Procedure: HOLMIUM LASER APPLICATION;  Surgeon: Matilda Senior, MD;  Location[facility];  Service: Urology;  Laterality: Left;   INGUINAL HERNIA REPAIR     per pt unilateral  approxl 1962   KNEE ARTHROSCOPY Right 02/21/2002   @[facility]    LUMBAR LAMINECTOMY/DECOMPRESSION MICRODISCECTOMY N/A 06/28/2022   Procedure: L4-5 decompression with removal of synovial cyst;  Surgeon:  Burnetta Aures, MD;  Location: [facility] OR;  Service: Orthopedics;  Laterality: N/A;  2.5 hrs 3 C-Bed   NASAL SINUS SURGERY     x2  in 2004   RECONSTRUCTION TENDON PULLEY HAND Right 05/03/2007   @[facility] ;  right long finger   TOTAL COLECTOMY  1992   W/  ILIEOANAL ANASTOMOSIS (for ilieoanal anatomatic stricture , partial bowel obstruction and required peroidic pneumatic dilatation's)   VARICOCELECTOMY     Family History Family History  Problem Relation Age of Onset   Cancer Father        rectal cancer and MM   Rectal cancer Father    Dementia Mother    Hypertension Mother    Heart disease Brother        valve repaired at 50   Colon cancer Neg Hx    Esophageal cancer Neg Hx    Stomach cancer Neg Hx     Social History Social History   Tobacco Use   Smoking status: Former    Current packs/day: 0.00    Types: Cigarettes    Start date: 1971    Quit date: 1981    Years since quitting: 44.8    Passive exposure: Past   Smokeless tobacco: Never  Vaping Use   Vaping status: Never Used  Substance Use Topics   Alcohol use: Yes    Alcohol/week: 14.0 standard drinks of alcohol    Types: 14 Glasses of wine per week    Comment: 2 glasses of wine per night   Drug use: No   Allergies Patient has no known allergies.  Review of Systems A thorough review of systems was obtained and all systems are negative except as noted in the HPI and PMH.    Physical Exam Vital Signs  I have reviewed the triage vital signs BP (!) 151/78 (BP Location: Left Arm)   Pulse (!) 56   Temp 98.4 F (36.9 C) (Oral)   Resp 18   Ht 5' 7 (1.702 m)   Wt 72.6 kg   SpO2 98%   BMI 25.06 kg/m  Physical Exam Vitals and nursing note reviewed.  Constitutional:      General: He is not in acute distress.    Appearance: He is well-developed.  HENT:     Head: Normocephalic and atraumatic.     Right Ear: External ear normal.     Left Ear: External ear normal.     Mouth/Throat:     Mouth: Mucous membranes are moist.  Eyes:     General: No scleral icterus. Cardiovascular:     Rate and Rhythm: Normal rate and regular rhythm.     Pulses: Normal pulses.     Heart sounds: Normal heart sounds.  Pulmonary:     Effort: Pulmonary effort is normal. No respiratory distress.     Breath sounds: Normal breath sounds.  Abdominal:     General: Abdomen is flat.     Palpations: Abdomen is soft.     Tenderness: There is no abdominal tenderness.  Musculoskeletal:     Cervical back: No rigidity.     Right lower leg: No edema.     Left lower leg: No edema.  Skin:    General: Skin is warm and dry.     Capillary Refill: Capillary refill takes less than 2 seconds.  Neurological:     Mental Status: He is alert.     Motor: Tremor present.  Psychiatric:        Mood and Affect:  Mood normal.        Behavior: Behavior normal.     ED Results and Treatments Labs (all labs ordered are listed, but only abnormal results are displayed) Labs Reviewed  BASIC METABOLIC PANEL WITH GFR - Abnormal; Notable for the following components:      Result Value   Glucose, Bld 127 (*)    All other components within normal limits  CBC  TROPONIN T, HIGH SENSITIVITY  TROPONIN T, HIGH SENSITIVITY                                                                                                                          Radiology DG Chest 2 View Result Date: 06/28/2024 CLINICAL DATA:   Right-sided chest pain. EXAM: CHEST - 2 VIEW COMPARISON:  Jan 19, 2023 FINDINGS: The heart size and mediastinal contours are within normal limits. An ill-defined 7 mm nodular opacity is seen along the periphery of the upper left lung. No acute infiltrate, pleural effusion or pneumothorax is identified. The visualized skeletal structures are unremarkable. IMPRESSION: 1. No active cardiopulmonary disease. 2. Ill-defined 7 mm nodular opacity along the periphery of the upper left lung. Correlation with nonemergent chest CT is recommended to further exclude the presence of an underlying neoplastic process. Electronically Signed   By: Suzen Dials M.D.   On: 06/28/2024 12:36    Pertinent labs & imaging results that were available during my care of the patient were reviewed by me and considered in my medical decision making (see MDM for details).  Medications Ordered in ED Medications - No data to display                                                                                                                                   Procedures Procedures  (including critical care time)  Medical Decision Making / ED Course    Medical Decision Making:    William [name] is a 73 y.o. male with past medical history as below, significant for BPH, Parkinson disease, esophageal stricture, arthritis, ulcerative colitis who presents to the ED with complaint of chest pain. The complaint involves an extensive differential diagnosis and also carries with it a high risk of complications and morbidity.  Serious etiology was considered. Ddx includes but is not limited to: Differential includes all life-threatening causes for chest pain. This includes but is not exclusive to acute coronary syndrome, aortic  dissection, pulmonary embolism, cardiac tamponade, community-acquired pneumonia, pericarditis, musculoskeletal chest wall pain, etc.   Complete initial physical exam performed, notably the patient was in no  acute distress, currently symptomatic.    Reviewed and confirmed nursing documentation for past medical history, family history, social history.  Vital signs reviewed.    Chest pain> - Transient chest pain that has since resolved, postprandial. - He is currently asymptomatic - Most likely atypical chest pain, ACS is unlikely at this time. - Symptoms have resolved, tolerant p.o. w/o difficulty, he is HDS, he is stable for discharge at this time  The patient's chest pain is not suggestive of pulmonary embolus, cardiac ischemia, aortic dissection, pericarditis, myocarditis, pulmonary embolism, pneumothorax, pneumonia, Zoster, or esophageal perforation, or other serious etiology.  Historically not abrupt in onset, tearing or ripping, pulses symmetric. EKG nonspecific for ischemia/infarction. No dysrhythmias, brugada, WPW, prolonged QT noted.   Troponin negative x2. CXR reviewed. Labs without demonstration of acute pathology unless otherwise noted above. Low HEART Score: 0-3 points (0.9-1.7% risk of MACE).     3:33 PM:  I have discussed the diagnosis/risks/treatment options with the patient and family.  Evaluation and diagnostic testing in the emergency department does not suggest an emergent condition requiring admission or immediate intervention beyond what has been performed at this time.  They will follow up with PCP. We also discussed returning to the ED immediately if new or worsening sx occur. We discussed the sx which are most concerning (e.g., sudden worsening pain, fever, inability to tolerate by mouth chest pain, dyspnea, syncope) that necessitate immediate return.    The patient appears reasonably screened and/or stabilized for discharge and I doubt any other medical condition or other [facility] requiring further screening, evaluation, or treatment in the ED at this time prior to discharge.                        Additional history obtained: -Additional history obtained  from family -External records from outside source obtained and reviewed including: Chart review including previous notes, labs, imaging, consultation notes including  Home medications   Lab Tests: -I ordered, reviewed, and interpreted labs.   The pertinent results include:   Labs Reviewed  BASIC METABOLIC PANEL WITH GFR - Abnormal; Notable for the following components:      Result Value   Glucose, Bld 127 (*)    All other components within normal limits  CBC  TROPONIN T, HIGH SENSITIVITY  TROPONIN T, HIGH SENSITIVITY    Notable for labs are stable  EKG   EKG Interpretation Date/Time:  Thursday June 28 2024 11:40:57 EDT Ventricular Rate:  78 PR Interval:  228 QRS Duration:  117 QT Interval:  386 QTC Calculation: 440 R Axis:   -53  Text Interpretation: Sinus rhythm Prolonged PR interval LAE, consider biatrial enlargement LAD, consider left anterior fascicular block Confirmed by Elnor Savant ([id number]) on 06/28/2024 12:16:29 PM         Imaging Studies ordered: I ordered imaging studies including chest x-ray I independently visualized the following imaging with scope of interpretation limited to determining acute life threatening conditions related to emergency care; findings noted above I agree with the radiologist interpretation If any imaging was obtained with contrast I closely monitored patient for any possible adverse reaction a/w contrast administration in the emergency department   Medicines ordered and prescription drug management: No orders of the defined types were placed in this encounter.   -I have reviewed the patients home  medicines and have made adjustments as needed   Consultations Obtained: Not applicable  Cardiac Monitoring: The patient was maintained on a cardiac monitor.  I personally viewed and interpreted the cardiac monitored which showed an underlying rhythm of: SR Continuous pulse oximetry interpreted by myself, 98% on RA.    Social  Determinants of Health:  Diagnosis or treatment significantly limited by social determinants of health: former smoker   Reevaluation: After the interventions noted above, I reevaluated the patient and found that they have resolved  Co morbidities that complicate the patient evaluation  Past Medical History:  Diagnosis Date   Allergic rhinitis    Anemia, iron deficiency    Arthritis    BPH associated with nocturia    ED (erectile dysfunction)    GERD (gastroesophageal reflux disease)    Hiatal hernia    History of COVID-19 11/2020   per pt moderate symptoms that resovled   History of hypertension    followed by pcp  (per pt resolved no medication since approx 2017)   History of kidney stones    Left ureteral calculus    Parkinson disease [facility])    neurologist--- dr tat;   mild jaw dyskinesia,   gait disorder, tremors   Peyronie's disease    PONV (postoperative nausea and vomiting)    S/P dilatation of esophageal stricture    Trigeminal neuropathy    left side   Ulcerative colitis    followed by dr abran (GI)//   1992 s/p  total abdominal colectomy with ileoanal anastomosis   Wears glasses       Dispostion: Disposition decision including need for hospitalization was considered, and patient discharged from emergency department.    Final Clinical Impression(s) / ED Diagnoses Final diagnoses:  Atypical chest pain        Elnor Jayson LABOR, DO 06/28/24 1533

## 2024-08-02 ENCOUNTER — Encounter: Payer: Self-pay | Admitting: Neurology

## 2024-08-06 DIAGNOSIS — N50812 Left testicular pain: Secondary | ICD-10-CM | POA: Diagnosis not present

## 2024-09-16 ENCOUNTER — Other Ambulatory Visit: Payer: Self-pay | Admitting: Neurology

## 2024-09-16 DIAGNOSIS — G20A1 Parkinson's disease without dyskinesia, without mention of fluctuations: Secondary | ICD-10-CM

## 2024-09-16 DIAGNOSIS — G20B1 Parkinson's disease with dyskinesia, without mention of fluctuations: Secondary | ICD-10-CM

## 2024-09-24 ENCOUNTER — Other Ambulatory Visit: Payer: Self-pay

## 2024-09-24 ENCOUNTER — Telehealth: Payer: Self-pay | Admitting: Neurology

## 2024-09-24 DIAGNOSIS — G20B2 Parkinson's disease with dyskinesia, with fluctuations: Secondary | ICD-10-CM

## 2024-09-24 MED ORDER — AMANTADINE HCL 100 MG PO CAPS: 100.0000 mg | ORAL_CAPSULE | Freq: Three times a day (TID) | ORAL | 0 refills | Status: AC

## 2024-09-24 NOTE — Telephone Encounter (Signed)
 Called pateint and RX sent

## 2024-09-24 NOTE — Telephone Encounter (Signed)
 Pt. Needs new script on 1 pill and no refills left, He also wants all Rx's to be sent to [facility] [address]

## 2024-10-26 ENCOUNTER — Ambulatory Visit: Admitting: Neurology

## 2024-11-19 ENCOUNTER — Ambulatory Visit: Admitting: Neurology

## 2024-12-17 ENCOUNTER — Ambulatory Visit

## 2024-12-25 ENCOUNTER — Ambulatory Visit: Attending: Neurology | Admitting: Occupational Therapy

## 2024-12-25 ENCOUNTER — Ambulatory Visit: Admitting: Physical Therapy

## 2025-01-08 ENCOUNTER — Ambulatory Visit
